# Patient Record
Sex: Female | Born: 1940
Health system: Southern US, Community
[De-identification: ages and names within clinical notes are randomized; demographics above are authoritative.]

## PROBLEM LIST (undated history)

## (undated) DIAGNOSIS — H3530 Unspecified macular degeneration: Secondary | ICD-10-CM

## (undated) DIAGNOSIS — I639 Cerebral infarction, unspecified: Secondary | ICD-10-CM

## (undated) DIAGNOSIS — F419 Anxiety disorder, unspecified: Secondary | ICD-10-CM

## (undated) DIAGNOSIS — N189 Chronic kidney disease, unspecified: Secondary | ICD-10-CM

## (undated) DIAGNOSIS — M1990 Unspecified osteoarthritis, unspecified site: Secondary | ICD-10-CM

## (undated) DIAGNOSIS — F0390 Unspecified dementia without behavioral disturbance: Secondary | ICD-10-CM

## (undated) DIAGNOSIS — D649 Anemia, unspecified: Secondary | ICD-10-CM

## (undated) DIAGNOSIS — J449 Chronic obstructive pulmonary disease, unspecified: Secondary | ICD-10-CM

## (undated) DIAGNOSIS — K219 Gastro-esophageal reflux disease without esophagitis: Secondary | ICD-10-CM

## (undated) DIAGNOSIS — F17200 Nicotine dependence, unspecified, uncomplicated: Secondary | ICD-10-CM

## (undated) DIAGNOSIS — Z87442 Personal history of urinary calculi: Secondary | ICD-10-CM

## (undated) HISTORY — PX: EYE SURGERY: SHX253

## (undated) HISTORY — PX: DILATION AND CURETTAGE OF UTERUS: SHX78

---

## 2000-06-06 ENCOUNTER — Other Ambulatory Visit: Admission: RE | Admit: 2000-06-06 | Discharge: 2000-06-06 | Payer: Self-pay | Admitting: *Deleted

## 2003-08-05 ENCOUNTER — Other Ambulatory Visit: Admission: RE | Admit: 2003-08-05 | Discharge: 2003-08-05 | Payer: Self-pay | Admitting: Family Medicine

## 2004-07-22 HISTORY — PX: COLECTOMY: SHX59

## 2004-07-24 ENCOUNTER — Ambulatory Visit (HOSPITAL_COMMUNITY): Admission: RE | Admit: 2004-07-24 | Discharge: 2004-07-24 | Payer: Self-pay | Admitting: Gastroenterology

## 2004-07-24 ENCOUNTER — Encounter (INDEPENDENT_AMBULATORY_CARE_PROVIDER_SITE_OTHER): Payer: Self-pay | Admitting: Specialist

## 2004-08-21 ENCOUNTER — Encounter (INDEPENDENT_AMBULATORY_CARE_PROVIDER_SITE_OTHER): Payer: Self-pay | Admitting: Specialist

## 2004-08-21 ENCOUNTER — Inpatient Hospital Stay (HOSPITAL_COMMUNITY): Admission: RE | Admit: 2004-08-21 | Discharge: 2004-08-25 | Payer: Self-pay | Admitting: General Surgery

## 2004-10-08 ENCOUNTER — Other Ambulatory Visit: Admission: RE | Admit: 2004-10-08 | Discharge: 2004-10-08 | Payer: Self-pay | Admitting: Family Medicine

## 2004-11-27 ENCOUNTER — Encounter: Admission: RE | Admit: 2004-11-27 | Discharge: 2004-11-27 | Payer: Self-pay | Admitting: General Surgery

## 2005-12-10 ENCOUNTER — Other Ambulatory Visit: Admission: RE | Admit: 2005-12-10 | Discharge: 2005-12-10 | Payer: Self-pay | Admitting: Family Medicine

## 2005-12-17 ENCOUNTER — Encounter: Admission: RE | Admit: 2005-12-17 | Discharge: 2005-12-17 | Payer: Self-pay | Admitting: Family Medicine

## 2006-08-20 ENCOUNTER — Encounter: Admission: RE | Admit: 2006-08-20 | Discharge: 2006-08-20 | Payer: Self-pay | Admitting: Family Medicine

## 2006-12-19 ENCOUNTER — Other Ambulatory Visit: Admission: RE | Admit: 2006-12-19 | Discharge: 2006-12-19 | Payer: Self-pay | Admitting: Family Medicine

## 2007-12-21 ENCOUNTER — Other Ambulatory Visit: Admission: RE | Admit: 2007-12-21 | Discharge: 2007-12-21 | Payer: Self-pay | Admitting: Family Medicine

## 2007-12-31 ENCOUNTER — Encounter: Admission: RE | Admit: 2007-12-31 | Discharge: 2007-12-31 | Payer: Self-pay | Admitting: Family Medicine

## 2008-08-08 ENCOUNTER — Encounter: Admission: RE | Admit: 2008-08-08 | Discharge: 2008-08-08 | Payer: Self-pay | Admitting: Family Medicine

## 2010-10-21 ENCOUNTER — Emergency Department (HOSPITAL_COMMUNITY): Payer: Medicare Other

## 2010-10-21 ENCOUNTER — Encounter (HOSPITAL_COMMUNITY): Payer: Self-pay

## 2010-10-21 ENCOUNTER — Inpatient Hospital Stay (HOSPITAL_COMMUNITY)
Admission: EM | Admit: 2010-10-21 | Discharge: 2010-10-25 | DRG: 418 | Disposition: A | Discharge status: Home / Self Care | Payer: Medicare Other | Attending: Family Medicine | Admitting: Family Medicine

## 2010-10-21 DIAGNOSIS — F17200 Nicotine dependence, unspecified, uncomplicated: Secondary | ICD-10-CM | POA: Diagnosis present

## 2010-10-21 DIAGNOSIS — F341 Dysthymic disorder: Secondary | ICD-10-CM | POA: Diagnosis present

## 2010-10-21 DIAGNOSIS — E876 Hypokalemia: Secondary | ICD-10-CM | POA: Diagnosis not present

## 2010-10-21 DIAGNOSIS — K8042 Calculus of bile duct with acute cholecystitis without obstruction: Secondary | ICD-10-CM | POA: Diagnosis present

## 2010-10-21 DIAGNOSIS — K5289 Other specified noninfective gastroenteritis and colitis: Secondary | ICD-10-CM | POA: Diagnosis present

## 2010-10-21 DIAGNOSIS — K219 Gastro-esophageal reflux disease without esophagitis: Secondary | ICD-10-CM | POA: Diagnosis present

## 2010-10-21 DIAGNOSIS — K859 Acute pancreatitis without necrosis or infection, unspecified: Principal | ICD-10-CM | POA: Diagnosis present

## 2010-10-21 LAB — LIPASE, BLOOD: Lipase: 625 U/L — ABNORMAL HIGH (ref 11–59)

## 2010-10-21 LAB — DIFFERENTIAL
Basophils Absolute: 0 10*3/uL (ref 0.0–0.1)
Basophils Relative: 0 % (ref 0–1)
Eosinophils Absolute: 0 10*3/uL (ref 0.0–0.7)
Eosinophils Relative: 0 % (ref 0–5)
Monocytes Absolute: 0.5 10*3/uL (ref 0.1–1.0)

## 2010-10-21 LAB — URINE MICROSCOPIC-ADD ON

## 2010-10-21 LAB — COMPREHENSIVE METABOLIC PANEL
ALT: 110 U/L — ABNORMAL HIGH (ref 0–35)
AST: 181 U/L — ABNORMAL HIGH (ref 0–37)
Calcium: 8.9 mg/dL (ref 8.4–10.5)
Creatinine, Ser: 0.72 mg/dL (ref 0.4–1.2)
GFR calc Af Amer: >60 mL/min (ref >60)
Sodium: 137 mEq/L (ref 135–145)
Total Protein: 6.4 g/dL (ref 6.0–8.3)

## 2010-10-21 LAB — CBC
MCHC: 34.5 g/dL (ref 30.0–36.0)
RDW: 12.9 % (ref 11.5–15.5)

## 2010-10-21 LAB — URINALYSIS, ROUTINE W REFLEX MICROSCOPIC
Bilirubin Urine: NEGATIVE
Hgb urine dipstick: NEGATIVE
Nitrite: NEGATIVE
Specific Gravity, Urine: 1.036 — ABNORMAL HIGH (ref 1.005–1.030)
pH: 8.5 — ABNORMAL HIGH (ref 5.0–8.0)

## 2010-10-21 MED ORDER — IOHEXOL 300 MG/ML  SOLN
100.0000 mL | Freq: Once | INTRAMUSCULAR | Status: AC | PRN
  Administered 2010-10-21: 100 mL via INTRAVENOUS

## 2010-10-22 ENCOUNTER — Other Ambulatory Visit (HOSPITAL_COMMUNITY): Payer: Medicare Other

## 2010-10-22 ENCOUNTER — Inpatient Hospital Stay (HOSPITAL_COMMUNITY): Payer: Medicare Other

## 2010-10-22 LAB — BASIC METABOLIC PANEL
Calcium: 8.4 mg/dL (ref 8.4–10.5)
GFR calc Af Amer: >60 mL/min (ref >60)
GFR calc non Af Amer: >60 mL/min (ref >60)
Glucose, Bld: 75 mg/dL (ref 70–99)
Potassium: 3.9 mEq/L (ref 3.5–5.1)
Sodium: 134 mEq/L — ABNORMAL LOW (ref 135–145)

## 2010-10-22 LAB — DIFFERENTIAL
Basophils Absolute: 0 10*3/uL (ref 0.0–0.1)
Basophils Relative: 0 % (ref 0–1)
Eosinophils Relative: 1 % (ref 0–5)
Lymphocytes Relative: 20 % (ref 12–46)
Monocytes Absolute: 0.8 10*3/uL (ref 0.1–1.0)

## 2010-10-22 LAB — CBC
HCT: 31.3 % — ABNORMAL LOW (ref 36.0–46.0)
MCHC: 33.5 g/dL (ref 30.0–36.0)
Platelets: 215 10*3/uL (ref 150–400)
RDW: 13.3 % (ref 11.5–15.5)
WBC: 9.6 10*3/uL (ref 4.0–10.5)

## 2010-10-22 LAB — MAGNESIUM: Magnesium: 2 mg/dL (ref 1.5–2.5)

## 2010-10-23 LAB — COMPREHENSIVE METABOLIC PANEL
ALT: 61 U/L — ABNORMAL HIGH (ref 0–35)
AST: 41 U/L — ABNORMAL HIGH (ref 0–37)
Albumin: 2.9 g/dL — ABNORMAL LOW (ref 3.5–5.2)
CO2: 26 mEq/L (ref 19–32)
Chloride: 108 mEq/L (ref 96–112)
Creatinine, Ser: 0.75 mg/dL (ref 0.4–1.2)
GFR calc Af Amer: >60 mL/min (ref >60)
GFR calc non Af Amer: >60 mL/min (ref >60)
Sodium: 138 mEq/L (ref 135–145)
Total Bilirubin: 0.4 mg/dL (ref 0.3–1.2)

## 2010-10-23 LAB — CBC
Hemoglobin: 10.2 g/dL — ABNORMAL LOW (ref 12.0–15.0)
MCH: 32.1 pg (ref 26.0–34.0)
Platelets: 194 10*3/uL (ref 150–400)
RBC: 3.18 MIL/uL — ABNORMAL LOW (ref 3.87–5.11)
WBC: 7.5 10*3/uL (ref 4.0–10.5)

## 2010-10-23 LAB — HEMOCCULT GUIAC POC 1CARD (OFFICE): Fecal Occult Bld: NEGATIVE

## 2010-10-24 ENCOUNTER — Other Ambulatory Visit: Payer: Self-pay | Admitting: Surgery

## 2010-10-24 ENCOUNTER — Inpatient Hospital Stay (HOSPITAL_COMMUNITY): Payer: Medicare Other

## 2010-10-24 HISTORY — PX: CHOLECYSTECTOMY: SHX55

## 2010-10-24 LAB — BASIC METABOLIC PANEL
BUN: 4 mg/dL — ABNORMAL LOW (ref 6–23)
CO2: 26 mEq/L (ref 19–32)
Calcium: 7.9 mg/dL — ABNORMAL LOW (ref 8.4–10.5)
Glucose, Bld: 91 mg/dL (ref 70–99)
Sodium: 139 mEq/L (ref 135–145)

## 2010-10-24 LAB — CBC
HCT: 29 % — ABNORMAL LOW (ref 36.0–46.0)
Hemoglobin: 9.9 g/dL — ABNORMAL LOW (ref 12.0–15.0)
MCH: 31.9 pg (ref 26.0–34.0)
MCHC: 34.1 g/dL (ref 30.0–36.0)
MCV: 93.5 fL (ref 78.0–100.0)
RDW: 13.2 % (ref 11.5–15.5)

## 2010-10-24 LAB — HEMOCCULT GUIAC POC 1CARD (OFFICE): Fecal Occult Bld: NEGATIVE

## 2010-10-25 LAB — COMPREHENSIVE METABOLIC PANEL
BUN: 6 mg/dL (ref 6–23)
CO2: 24 mEq/L (ref 19–32)
Calcium: 7.6 mg/dL — ABNORMAL LOW (ref 8.4–10.5)
Chloride: 109 mEq/L (ref 96–112)
Creatinine, Ser: 0.69 mg/dL (ref 0.4–1.2)
GFR calc non Af Amer: >60 mL/min (ref >60)
Total Bilirubin: 0.5 mg/dL (ref 0.3–1.2)

## 2010-10-25 LAB — CBC
Hemoglobin: 9.6 g/dL — ABNORMAL LOW (ref 12.0–15.0)
MCH: 32.1 pg (ref 26.0–34.0)
MCHC: 33.9 g/dL (ref 30.0–36.0)
MCV: 94.6 fL (ref 78.0–100.0)
Platelets: 186 10*3/uL (ref 150–400)
RBC: 2.99 MIL/uL — ABNORMAL LOW (ref 3.87–5.11)

## 2010-10-25 NOTE — Consult Note (Signed)
[name], [name]               ACCOUNT NO.:  [id number]  MEDICAL RECORD NO.:  [id number]           PATIENT TYPE:  I  LOCATION:  5522                         FACILITY:  [facility]  PHYSICIAN:  Mary Sella. Andrey Campanile, MD     DATE OF BIRTH:  Sep 28, 1940  DATE OF CONSULTATION:  10/22/2010 DATE OF DISCHARGE:                                CONSULTATION   PHYSICIAN REQUESTING CONSULTATION:  Jeoffrey Massed, MD.  PRIMARY CARE PHYSICIAN:  [name]. Valentina Lucks, MD  CHIEF COMPLAINT:  Abdominal pain.  REASON FOR CONSULTATION:  Cholecystitis.  HISTORY OF PRESENT ILLNESS:  [name] is a very pleasant 70 year old female who is in her usual state of health until she developed upper abdominal pain after eating breakfast this past Sunday.  The pain was constant.  At times, it was sharp and most of the time it was dull and crampy.  The pain lasted pretty much all day long prompting her to go to an urgent care medical center for evaluation.  There, she was found to have an elevated white blood cell count, thus was told to go to the emergency room.  While she was at home prior to going to the Urgent Care, she had one episode of nausea and vomiting.  She said she might have symptoms similar to this, maybe 1-2 months ago.  It was simply milder.  She described it as upper abdominal pain, mainly at night and went up into her chest and it felt like a pressure.  She initially thought she was having a heart attack.  She took Tums and Xanax which relieved the pain.  She went her PCP the next day and the EKG was normal.  She denies any jaundice or weight loss.  She denies any diarrhea or constipation.  Her last bowel movement was earlier today and it was normal.  She denies any alcohol or new medications.  PAST MEDICAL HISTORY: 1. Gastroesophageal reflux disease, 2. History of colitis. 3. Depression. 4. Glaucoma. 5. History of tubulovillous adenoma.  PAST SURGICAL HISTORY:  Right hemicolectomy.  MEDICATIONS  AT HOME: 1. Calcium carbonate. 2. Multivitamin. 3. Vitamin B12. 4. Vitamin B6. 5. Vitamin D. 6. Vitamin E. 7. Omeprazole. 8. Lexapro. 9. Evista. 10.Bupropion. 11.Xanax. 12.Cosopt.  HOSPITAL MEDICATIONS:  Lovenox, Protonix, Levaquin, and Flagyl.  ALLERGIES:  No known drug allergies.  REVIEW OF SYSTEMS:  She denies chest pain, shortness of breath, dyspnea on exertion, and paroxysmal nocturnal dyspnea.  She does have chronic neck pain.  She wears glasses.  Otherwise, a comprehensive 12-point review of systems is negative except as mentioned in the HPI.  FAMILY HISTORY:  Significant for 2 sisters having breast cancer.  She also has 1 brother and her father with some unknown cancer.  SOCIAL HISTORY:  She is married.  She smokes less than a pack a day and has for about 50 years.  She denies any drugs or alcohol.  PHYSICAL EXAMINATION:  GENERAL:  A well-developed, well-nourished Caucasian female in no apparent distress. VITAL SIGNS:  Temperature 98.4, heart rate 83, blood pressure 108/69, respirations 18, and saturating 96% on room air. HEENT:  Atraumatic  and normocephalic.  Pupils are equal.  No scleral icterus.  Positive glasses.  No external ear lesions.  Hearing grossly normal. NECK:  Supple.  Trachea is midline. PULMONARY:  Lungs are clear.  Symmetric chest rise.  No accessory respiratory muscles. CARDIOVASCULAR:  Regular rate and rhythm.  2+ radial pulse. ABDOMEN:  Soft and nondistended.  Positive bowel sounds.  Well-healed transverse right abdominal incision and no signs of incisional hernia. She is tender to palpation in the right upper quadrant.  No rebound.  No guarding. MUSCULOSKELETAL:  Free range of motion.  Moves all extremities well. Strength is symmetric. NEUROLOGIC:  Nonfocal.  Sensation grossly intact. SKIN:  No jaundice.  No rash.  No edema. PSYCHIATRIC:  Alert and oriented.  Judgment seems to be appropriate.  LABORATORY DATA:  BMET from today shows  sodium 134, potassium 3.9, chloride 100, bicarb 26, BUN 10, creatinine 0.7, blood sugar 75, calcium 8.4, mag 2.  White blood cell count 9.6, down from 14.4 on admission; hemoglobin 10.5, down from 12.7; hematocrit 31.3, down from 36.8. Urinalysis negative.  Significant labs from admission on October 21, 2010, showed a total bilirubin of 0.9, AST of 181, ALT 110, alkaline phosphatase 41, lipase elevated at 625.  RADIOGRAPHY: 1. Acute bowel series, nothing acute. 2. CT of abdomen and pelvis showed gallbladder wall edema, stable     right inferior hepatic lobe cyst, nonobstructing stone in the upper     pole of the right kidney, and questionable mild edema in the     sigmoid colon and rectum. 3. Ultrasound abdomen showed gallbladder wall thickening with     pericholecystic fluid and some sludge.  The common bile duct was of     normal caliber and size.  There is a 1.2-cm cyst in the right lobe.  IMPRESSION:  This is a 70 year old female with: 1. Anemia. 2. Gastroesophageal reflux disease. 3. Glaucoma. 4. Elevated LFTs. 5. Acute cholecystitis.  PLAN:  I think the LFT elevation and lipase elevation are secondary to her passing a stone or having thick sludge in her duct.  There were really no signs of pancreatitis on her CT and her pancreas looked pretty normal.  I agree with clears as tolerated.  I would continue IV antibiotics for acute cholecystitis.  I would continue chemical DVT prophylaxis for now.  I would definitely repeat a CMET and lipase in the morning.  If it is trending down, I would just follow it.  However, if it is trending up, she would more than likely need a gastroenterology consult.  We are tentatively going to plan the laparoscopic cholecystectomy on Wednesday.  We discussed the risks and benefits of surgery.  Dr. Dwain Sarna and physician extenders will see her tomorrow and review the labs and come up with a definitive plan about timing for surgery.     Mary Sella.  Andrey Campanile, MD     EMW/MEDQ  D:  10/22/2010  T:  10/23/2010  Job:  [id number]  cc:   [name]. Valentina Lucks, M.D.  Electronically Signed by Gaynelle Adu M.D. on 10/25/2010 07:57:24 AM

## 2010-10-26 LAB — CROSSMATCH
ABO/RH(D): A POS
Antibody Screen: NEGATIVE
Unit division: 0

## 2010-10-26 NOTE — Discharge Summary (Signed)
NAMEKRISANDRA, [name]               ACCOUNT NO.:  [id number]  MEDICAL RECORD NO.:  [id number]           PATIENT TYPE:  I  LOCATION:  5522                         FACILITY:  [facility]  PHYSICIAN:  Pleas Koch, MD        DATE OF BIRTH:  [birth year]  DATE OF ADMISSION:  10/21/2010 DATE OF DISCHARGE:  10/25/2010                              DISCHARGE SUMMARY   PERTINENT CONSULTATIONS:  Mary Sella. Andrey Campanile, MD  PERTINENT PROCEDURES DONE:  Laparoscopic cholecystectomy with intraoperative cholangiogram done on October 24, 2010.  DISCHARGE DIAGNOSES: 1. Gallstone pancreatitis status post cholecystectomy. 2. Colitis query cause. 3. Reflux. 4. Depression, anxiety. 5. History of right colon tubulovillous adenoma status post an     ileotransverse colostomy in January 2001.  DISCHARGE MEDICATIONS:  Are as follows, 1. Tylenol Extra Strength 500 mg 2 tablets t.i.d. p.r.n. 2. Bupropion XL 150 mg 1 tablet daily. 3. Lexapro 10 mg 1.5 tablets daily.4. Xanax 1 tablet 0.25 mg daily p.r.n. for anxiety. 5. Cosopt ophthalmic both eyes 1 drop b.i.d. 6. Evista 60 mg 1 tablet daily. 7. Multivitamins over-the-counter 1 tablet daily. 8. Latanoprost 0.005% both eyes ophthalmic 1 drop at bedtime. 9. Omeprazole 1 capsule daily. 10.Calcium carbonate over-the-counter 3 tabs daily. 11.Vitamin B12 one tab daily. 12.B6 one tablet daily. 13.Vitamin D over-the-counter 1 tablet daily. 14.Vitamin E orally over-the-counter daily. 15.KDUR 20 mg, 5 day supply 16.Vicodin 5/500 per General Surgeon's instructions (script written)  RECOMMENDATIONS ON Follow up with pcp:  Likely would be reasonable to discontinue vitamin E and discontinue her p.r.n. Tylenol.  I have prescribed her with limited course of hydrocodone/APAP 5/325 mg 32 tablets to take q.6 h. p.r.n. for pain, not relieved with ibuprofen over-the-counter.  Please see full dictation number [id number].  Briefly, this is a 70 year old female with history of epigastric  generalized abdominal pain starting at home and she started constipating, one episode was noted but no diarrhea, fever, chills, shortness breath, cramping was dull in character persisted over 12 hours.  She had weakness, fatigue, abdominal pain, epigastric with generalized vomiting and nausea, no diarrhea.  Anxiety.  PHYSICAL EXAMINATION:  VITAL SIGNS:  On admission blood pressure 100/50, temperature 99.1, respirations 18, O2 sats 96%.  Pertinent positives on exam. ABDOMEN:  Slightly tender epigastric right lower quadrant, but not severely so positive bowel sounds.  No hepatosplenomegaly.  No hernia. CT abdomen equals mild.  LABORATORY/IMAGES DATA:  CT of abdomen equals mild edema, thickening of sigmoid colon, rectum, gallbladder, and edema as well.  Lipase 625, AST 181, ALT 101.  WBC 14.4.  T-bili 0.9, BUN 14, and creatinine 0.72.  HOSPITAL COURSE:  According to plan, 1. Gallstone pancreatitis.  The patient had an ultrasound done on     October 22, 2010, which showed biliary sludge, gallbladder thickening,     and pericholecystic fluid suggesting cholecystitis.  It was noted     that her LFTs did trend down on their own.  However, we consulted     Dr. Andrey Campanile for General Surgery and it was recommended that since     LFTs were still trending slightly up, the plan was  for surgery.     The patient had surgery for March 2012, and tolerated well under     Dr. Dwain Sarna.  The patient did very well status postop up and was     cleared by surgeon for discharge.  Outpatient will followup in 2     weeks with Dr. Dwain Sarna in his office. 2. Colitis.  This has been a chronic problem in the past and she     follows with Dr. Evette Cristal for this. 1 out of 3 Hemoccult cards was     positive for blood.  Her hemoglobin was 12.7 on admission and     dropped to about 9.6 on discharge.  This also may be likely     secondary to surgery.  As such as she has just had surgery,      I am electing to discharge her  home with the     caveat that she will follow up with Dr. Evette Cristal in the outpatient     setting in about 1 month's time to determine further followup, as     she has had a colonoscopy in 2006 showing tubulovillous adenoma.     The patient is agreeable to the same. 3. Reflux.  The patient will be continued on her omeprazole. 4. Pain.  The patient was given limited prescription of     hydrocodone/APAP for recent surgery by Surgeon-30 tablets 5. Depression.  The patient will continue on bupropion, Lexapro, and     Xanax as needed.  The patient will need followup with primary care     physician for this. 6. Hypokalemia.  The patient was slightly hypokalemic on day of     discharge and I have given her K-Dur 20 mEq to take for 5 days.     The patient will benefit from a CMP in the near future at primary     care physician's office.  PHYSICAL EXAMINATION:  VITAL SIGNS:  Stable on discharge, her temperature was 98.3, blood pressure 103-109 over 50-65, pulse 61, and respirations 22.          ______________________________ Pleas Koch, MD     JS/MEDQ  D:  10/25/2010  T:  10/25/2010  Job:  [id number]  cc:   Mary Sella. Andrey Campanile, MD Juanetta Gosling, MD [name] Valentina Lucks, M.D. [name], M.D.  Electronically Signed by Pleas Koch MD on 10/26/2010 05:03:33 AM

## 2010-10-28 LAB — CULTURE, BLOOD (ROUTINE X 2)
Culture  Setup Time: 201204020841
Culture: NO GROWTH

## 2010-10-30 NOTE — Op Note (Signed)
[name]               ACCOUNT NO.:  [id number]  MEDICAL RECORD NO.:  [id number]           PATIENT TYPE:  I  LOCATION:  5522                         FACILITY:  [facility]  PHYSICIAN:  Abigail Miyamoto, M.D. DATE OF BIRTH:  [birth year]  DATE OF PROCEDURE:  10/24/2010 DATE OF DISCHARGE:                              OPERATIVE REPORT   PREOPERATIVE DIAGNOSIS:  Gallstone pancreatitis with acute cholecystitis.  POSTOPERATIVE DIAGNOSIS:  Gallstone pancreatitis with acute cholecystitis.  PROCEDURE:  Laparoscopic cholecystectomy with intraoperative cholangiogram.  SURGEON:  Abigail Miyamoto, MD  ANESTHESIA:  General endotracheal anesthesia.  ESTIMATED BLOOD LOSS:  Minimal.  FINDINGS:  The patient was found to have a normal cholangiogram. Gallbladder showed findings consistent with acute cholecystitis.  PROCEDURE IN DETAIL:  The patient was brought to the operating room and identified as [name].  She was placed supine on the operating table and general anesthesia was induced.  Her abdomen was then prepped and draped in the usual sterile fashion.  Using a #15 blade, a small vertical incision was made above the umbilicus.  This was carried down to the fascia which was then opened with a scalpel.  A hemostat was then used to pass into the peritoneal cavity under direct vision.  Next, a 0 Vicryl pursestring suture was placed around the fascial opening.  The Hasson port was placed through the opening and insufflation of the abdomen was begun.  A 5-mm port was placed in the patient's epigastrium and two more in the right upper quadrant under direct vision.  I visualized the abdomen and saw no adhesions from her previous right partial colectomy.  The gallbladder was found to be acutely inflamed.  I was able to grasp and elevate it above the liver bed.  I then took down some adhesions from the gallbladder bluntly.  I was then able to identify the cystic duct and cystic artery  and achieve a critical window around both.  I clipped the cystic duct once distally, I then clipped the artery twice proximally, once distally, I then made a small opening to the cystic duct with laparoscopic scissors.  I placed a cholangiocatheter in the right upper quadrant under direct vision through a small incision.  I then placed this into the opening of the cystic duct.  A cholangiogram was then performed with contrast.  This demonstrated __________ duodenum without evidence of obstruction.  At this point, the cholangiocatheter was removed.  I clipped the cystic duct three times proximally and transected as well as the cystic artery. I then identified a posterior branch of cystic artery which I clipped as well.  The gallbladder was slowly dissected free from the liver bed with the electrocautery.  Once it was freed from liver bed, hemostasis was achieved in liver bed with cautery.  I then removed the gallbladder through the incision at the umbilicus.  The 0 Vicryl at the umbilicus was tied in place closing the fascial defect.  I again examined the liver bed and hemostasis was felt to be achieved.  The abdomen was then irrigated with saline.  All ports were then removed under direct vision and  the abdomen was deflated.  All incisions were anesthetized with Marcaine and closed with 4- 0 Monocryl subcuticular sutures.  Steri-Strips and Band-Aids were then applied.  The patient tolerated the procedure well.  All counts were correct at the end of the procedure.  The patient was then extubated in the operating room and taken in stable condition to the recovery room.     Abigail Miyamoto, M.D.     DB/MEDQ  D:  10/24/2010  T:  10/25/2010  Job:  [id number]  Electronically Signed by Abigail Miyamoto M.D. on 10/30/2010 03:53:53 PM

## 2010-11-19 ENCOUNTER — Encounter (INDEPENDENT_AMBULATORY_CARE_PROVIDER_SITE_OTHER): Payer: Self-pay | Admitting: General Surgery

## 2010-11-22 NOTE — H&P (Signed)
NAMENORVELLA, LOSCALZO               ACCOUNT NO.:  [id number]  MEDICAL RECORD NO.:  [id number]           PATIENT TYPE:  I  LOCATION:  5522                         FACILITY:  [facility]  PHYSICIAN:  Tripp Goins, DO         DATE OF BIRTH:  1941-07-12  DATE OF ADMISSION:  10/21/2010 DATE OF DISCHARGE:                             HISTORY & PHYSICAL   CHIEF COMPLAINT:  Abdominal pain and vomiting.  HISTORY OF PRESENT ILLNESS:  The patient is a 70 year old female who presents with history of epigastric/generalized abdominal pain since this morning, it started at home when she stood up from sitting at the computer.  The patient had one episode of emesis.  She denies diarrhea, fever, chills, shortness of breath.  The pain is cramping and dull in character, has persisted for over 12 hours, although the patient now says she is feeling better.  PAST MEDICAL HISTORY:  Significant for: 1. Tubulovillous adenoma. 2. Colitis. 3. GERD. 4. Depression. 5. Glaucoma. 6. Gout.  PAST SURGICAL HISTORY:  Significant for appendectomy, partial colectomy.  SOCIAL HISTORY:  No alcohol, no recreational drug use.  She does smoke about a pack per day giving her about a 50-pack-year history.  FAMILY HISTORY:  Strongly positive for cancer.  Two sisters had breast cancer.  One is still alive, 30+ years later.  The other died years later as other causes.  One other brother had cancer and COPD.  Mother died of old age.  Father also had some kind of cancer.  She does not remember what kind, but he also died of old age.  REVIEW OF SYSTEMS:  CONSTITUTIONAL:  Negative for fever.  Negative for chills.  Positive for weakness.  Positive for fatigue.  CNS:  No headaches, no seizures, no limb weakness.  ENT:  No nasal congestion, throat pain, or coryza.  CARDIOVASCULAR:  No chest pain.  No palpitations.  No orthopnea.  RESPIRATORY:  No cough, no shortness of breath, no wheezing.  GASTROINTESTINAL:  Positive for abdominal  pain, which is both epigastric and generalized.  Positive for vomiting. Positive for nausea for a short period of time.  Negative for diarrhea. Negative for constipation.  GENITOURINARY:  No dysuria.  No hematuria. No urinary frequency.  RENAL:  No flank pain.  No swelling.  No pruritus.  SKIN:  No rashes.  No sores.  No lesions.  HEMATOLOGICAL:  No easy bruising.  No purpura.  No clots.  LYMPHS:  No lymphadenopathy.  No painful nodes or no specific lymph swelling.  PSYCHIATRIC:  Positive for anxiety.  Positive for compression.  Negative for insomnia.  PHYSICAL EXAMINATION:  VITAL SIGNS:  Heart rate 86, temperature 99.1, blood pressure 100/50, respirations 18, O2 sat 96% on room air. GENERAL:  The patient is elderly, but awake, alert and oriented x3.  She is well developed, well nourished, and is able to give fair history. EYES:  Pupils are equal, round and reactive to light and accommodation. External ocular movements bilaterally intact.  Sclarea nonicteric, noninjected. MOUTH:  Oral mucosa is dry.  No lesions.  No sores. PHARYNX:  Clear.  No  erythema.  No exudate. NECK:  Negative for JVD.  Negative for thyromegaly.  Negative for lymphadenopathy. HEART:  Regular rate and rhythm at 80 beats per minute without murmurs, ectopy, or gallops.  No lateral PMI.  No thrills. LUNGS:  Clear to auscultation bilaterally without wheezes, rales or rhonchi.  No increased work of breathing.  No tactile fremitus. ABDOMEN:  Soft, slightly tender in epigastrium and right lower quadrant, but not severely so.  Positive bowel sounds.  No hepatosplenomegaly.  No hernias palpated. EXTREMITIES:  Negative for cyanosis, clubbing, or edema.  The patient has somewhat diminished dorsalis pedis and popliteal pulses bilaterally. No carotid bruits bilaterally. NEUROLOGIC:  Cranial nerves II through XII grossly intact.  Motor and sensory intact.  LABORATORY STUDIES:  CT of the abdomen shows mild edema, wall  thickening of the sigmoid colon and rectum.  Mild edema of gallbladder wall. Urinalysis is negative for UTI.  Lipase is 625.  Sodium 137, potassium 3.4, chloride 101, CO2 25, bun 14, creatinine 0.72.  T-bilis are 0.9, alk phos 41, AST 181, ALT 101, total protein 6.4, albumin 4.0, calcium 8.9.  WBC is 14.4, hemoglobin 12.7, hematocrit 36.8, platelets are 233.  ASSESSMENT: 1. Colitis.  The patient has elevated white count, abdominal pain and     right-sided colon and rectal edema.  Given her history of colitis,     she was reasonable. 2. Cholecystitis, this is unclear on the CT; however, the patient does     have gallbladder wall edema and elevated liver function test.  We     will check right upper quadrant ultrasound. 3. Nausea and vomiting. 4. Gastroesophageal reflux disease. 5. Tobacco abuse. 6. Hypokinesia. 7. Elevated LFTs.  PLAN: 1. Admit to regular medical bed. 2. IV fluids. 3. IV antibiotics. 4. Right upper quadrant ultrasound. 5. Hemoccult stools. 6. Supplement of potassium. 7. Proton pump inhibitor. 8. Tobacco cessation counseling, 7 minutes performed. 9. Clear liquid diet for now. 10.Follow LFTs and white blood cell count.  I have spent 42 minutes from this admission.          ______________________________ Fran Lowes, DO     AS/MEDQ  D:  10/21/2010  T:  10/22/2010  Job:  [id number]  cc:   Feliciana Rossetti, MD  Electronically Signed by Fran Lowes DO on 11/22/2010 06:07:12 PM

## 2010-12-07 NOTE — Op Note (Signed)
[name], [name]               ACCOUNT NO.:  [id number]   MEDICAL RECORD NO.:  [id number]          PATIENT TYPE:  AMB   LOCATION:  ENDO                         FACILITY:  [facility]   PHYSICIAN:  [name], M.D.   DATE OF BIRTH:  Jun 23, 1941   DATE OF PROCEDURE:  07/24/2004  DATE OF DISCHARGE:                                 OPERATIVE REPORT   PROCEDURE:  Esophagogastroduodenoscopy with biopsy for CLO test.   ENDOSCOPIST:  Dr. Herbert Moors   PREMEDICATIONS:  Premedication was fentanyl 40 mcg IV, Versed 5 mg IV.   INDICATIONS:  Heartburn.  Informed consent was obtained after explanation of  the risks of bleeding, infection and perforation.   PROCEDURE:  With the patient in the left lateral decubitus position, the  Olympus gastroscope was inserted into the oropharynx and passed into the  esophagus.  It was advanced down the esophagus and then into the stomach and  into the duodenum.  The second portion and bulb of the duodenum were normal.  The stomach showed a diffuse mild-to-moderate erythema to the mucosa  compatible with gastritis.  Biopsy for CLO test was obtained to look for any  evidence of Helicobacter pylori.  The fundus and cardia looked normal on  retroflexion.  The esophagus looked normal in its entirety.  The  esophagogastric junction was at 39 cm.  She tolerated the procedure well  without complications.   IMPRESSION:  Gastritis.   PLAN:  The CLO test will be checked.       SFG/MEDQ  D:  07/24/2004  T:  07/24/2004  Job:  [id number]   cc:   [name]. Valentina Lucks, M.D.  [address] [place]  [place] [address]  Fax: [phone]

## 2010-12-07 NOTE — Op Note (Signed)
NAMEPEARLENA, OW               ACCOUNT NO.:  [id number]   MEDICAL RECORD NO.:  [id number]          PATIENT TYPE:  AMB   LOCATION:  ENDO                         FACILITY:  [facility]   PHYSICIAN:  [name], M.D.   DATE OF BIRTH:  [birth year]   DATE OF PROCEDURE:  07/24/2004  DATE OF DISCHARGE:                                 OPERATIVE REPORT   PROCEDURE:  Colonoscopy with biopsy.   INDICATIONS:  Diarrhea.  Etiology unclear.   Informed consent was obtained after explantation of the risks of bleeding,  infection, and perforation.   PREMEDICATIONS:  The procedure was done immediately after an EGD, with  additional 20 mcg of fentanyl and 2 mg of Versed.   PROCEDURE:  With the patient in the left lateral decubitus position, a  rectal exam was performed.  No masses were felt. The Olympus colonoscope was  inserted into the rectum and advanced around the colon to the cecum.  Cecal  landmarks were identified.  The cecum looked normal.  In the proximal  ascending colon, there was a 4 cm sessile mass lesion which was taking up  approximately 1/2 to 3/4 of the lumen.  The lesion was biopsied.  The mucosa  peeled off as it was biopsied.  It is suspicious for malignancy versus large  villous adenoma.  The rest of the ascending colon looked normal.  The  transverse colon looked normal.  The descending colon and sigmoid looked  normal.  In the distal rectum, there was a 3 mm polyp biopsied with cold  forceps.  She tolerated the procedure well, without complications.   IMPRESSION:  1.  Large 4 cm mass in the proximal ascending colon biopsied to rule out      malignancy.  2.  Small 3 mm rectal polyp.   PLAN:  The patient will be referred for surgery for this large mass lesion  in the proximal ascending colon.       SFG/MEDQ  D:  07/24/2004  T:  07/24/2004  Job:  [id number]   cc:   [name]. Valentina Lucks, M.D.  [address] [place]  [place] [address]  Fax: [phone]

## 2010-12-07 NOTE — Op Note (Signed)
[name], [name]               ACCOUNT NO.:  [id number]   MEDICAL RECORD NO.:  [id number]          PATIENT TYPE:  INP   LOCATION:  0012                         FACILITY:  [facility]   PHYSICIAN:  Gita Kudo, M.D. DATE OF BIRTH:  March 16, 1941   DATE OF PROCEDURE:  08/21/2004  DATE OF DISCHARGE:                                 OPERATIVE REPORT   OPERATIVE PROCEDURE:  Right colectomy with primary anastomosis -  ileotransverse colostomy.   SURGEON:  Gita Kudo, M.D.   ASSISTANT:  Lorne Skeens. Hoxworth, M.D.   ANESTHESIA:  General endotracheal.   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   CLINICAL SUMMARY:  This 70 year old female underwent a colonoscopy that  showed a biopsy-proven TVA.  She is in good health and has had no other  significant medical problems except for some depression.   OPERATIVE FINDINGS:  The patient had a sessile tumor in the distal ascending  colon that felt about 4 cm in size.  The liver looked normal, without any  evidence of metastatic disease.  There was no fluid in the abdomen, no  enlarged lymph nodes.  The gallbladder felt and looked normal.  The  remainder of the large and small bowel felt and looked normal also.   OPERATIVE PROCEDURE:  Under satisfactory general endotracheal anesthesia,  the patient was positioned, prepped and draped in a standard fashion.  She  received heparin and Cefotan preop and had nasogastric and Foley catheters  placed.  She had a good bowel prep.  A transverse incision made from the  umbilicus laterally and carried into the peritoneal cavity.  Bleeders were  coagulated or tied with silk.  The laparotomy revealed the findings  mentioned above, and then a right colectomy performed.  Self-retaining  retractors gave excellent exposure, and operating with the cautery the  peritoneal reflections of  the right colon were taken down and the distal  ileum and proximal transverse colon mobilized.  Then, the distal ileum was  transected with a GIA stapler near the cecum, and the transverse colon  likewise transected.  The mesentery was divided between clamps and ties of  silk and the specimen removed.  The small bowel had a proximal occluding  clamp placed, and then a side-to-side/end-to-end GIA stapled anastomosis  performed.  Stab wound was made, stapler fired.  Anastomotic staple line  looked good.  Stab wound closed in two layers with interrupted and running  silk, and then the mesentery closed with interrupted silk sutures after the  gloves were changed.  The abdomen was then lavaged with saline.  The return  was noted to be clear.  Abdomen was closed in layers with running #1 PDS  suture, followed by staples for skin, and then a sterile absorbent dressing  applied.   Blood loss was negligible.  The patient tolerated the procedure well.  Total  operating time 1 hour 15 minutes.  No complications.  Patient to the  recovery room in good condition.  MRL/MEDQ  D:  08/21/2004  T:  08/21/2004  Job:  [id number]   cc:   [name]. Valentina Lucks, M.D.  [address] [place]  [place] [address]  Fax: [phone]   [name], M.D.  [address]  [address]  [place], [place] [address]  Fax: [phone]

## 2010-12-07 NOTE — Discharge Summary (Signed)
NAMEMACKINSEY, PELLAND               ACCOUNT NO.:  [id number]   MEDICAL RECORD NO.:  [id number]          PATIENT TYPE:  INP   LOCATION:  0476                         [facility]ACILITY[facility]   PHYSICIAN:  Gita Kudo, M.D. DATE OF BIRTH:  June 21, 1941   DATE OF ADMISSION:  08/21/2004  DATE OF DISCHARGE:  08/25/2004                                 DISCHARGE SUMMARY   CHIEF COMPLAINT:  Tumor of her colon.   HISTORY OF PRESENT ILLNESS:  This 70 year old female is admitted for  elective right colectomy.  She had colonoscopy showing a biopsy proven TVA.  Her general health is basically good, and she does have depression.   LABORATORY STUDIES:  Pathology:  Right colon and terminal ileum with a  tubulovillous adenoma with high-grade dysplasia, no invasive carcinoma,  benign appendix, 18 benign lymph nodes.  Other laboratory studies -  hemoglobin 13.0, hematocrit 38.0, white count 6100.  CMET within normal  limits except slightly low alk phos of 35.  Her CBA was normal at 2.8.  EKG  was normal.  The chest x-ray was negative.   HOSPITAL COURSE:  On the morning of admission, the patient underwent an  uneventful right colectomy.  Postoperatively, she did well.  She had her  tubes removed on schedule, and improved to the point where she was taking a  diet and passing gas and BM.  Accordingly, she was allowed home on her  fourth postop day.  She was tolerating a regular diet, limited activity.  Prescribed analgesics, and follow up in the office in 2-3 weeks.   DISCHARGE DIAGNOSES:  Right colon tubulovillous adenoma.   OPERATIONS:  On August 21, 2004, right colon resection.   COMPLICATIONS/INFECTIONS:  None.   CONDITION ON DISCHARGE:  Good.      MRL/MEDQ  D:  09/04/2004  T:  09/04/2004  Job:  [id number]   cc:   [name], M.D.  [address]  [address]  [place], [place] [address]  Fax: [phone]   [name]. Valentina Lucks, M.D.  [address] [place]  [place] [address]  Fax:  [phone]

## 2011-08-29 ENCOUNTER — Other Ambulatory Visit (HOSPITAL_COMMUNITY): Payer: Self-pay | Admitting: Gastroenterology

## 2011-09-04 ENCOUNTER — Ambulatory Visit (HOSPITAL_COMMUNITY)
Admission: RE | Admit: 2011-09-04 | Discharge: 2011-09-04 | Disposition: A | Discharge status: Home / Self Care | Payer: Medicare Other | Source: Ambulatory Visit | Attending: Gastroenterology | Admitting: Gastroenterology

## 2011-09-04 DIAGNOSIS — R1310 Dysphagia, unspecified: Secondary | ICD-10-CM | POA: Insufficient documentation

## 2014-05-12 ENCOUNTER — Other Ambulatory Visit: Payer: Self-pay

## 2014-06-09 ENCOUNTER — Other Ambulatory Visit: Payer: Self-pay | Admitting: Dermatology

## 2015-09-22 ENCOUNTER — Emergency Department (HOSPITAL_COMMUNITY)
Admission: EM | Admit: 2015-09-22 | Discharge: 2015-09-23 | Disposition: A | Discharge status: Home / Self Care | Payer: Medicare Other | Attending: Emergency Medicine | Admitting: Emergency Medicine

## 2015-09-22 ENCOUNTER — Encounter (HOSPITAL_COMMUNITY): Payer: Self-pay | Admitting: *Deleted

## 2015-09-22 DIAGNOSIS — T368X5A Adverse effect of other systemic antibiotics, initial encounter: Secondary | ICD-10-CM | POA: Insufficient documentation

## 2015-09-22 DIAGNOSIS — Z87440 Personal history of urinary (tract) infections: Secondary | ICD-10-CM | POA: Insufficient documentation

## 2015-09-22 DIAGNOSIS — Z792 Long term (current) use of antibiotics: Secondary | ICD-10-CM | POA: Diagnosis not present

## 2015-09-22 DIAGNOSIS — L299 Pruritus, unspecified: Secondary | ICD-10-CM | POA: Diagnosis not present

## 2015-09-22 DIAGNOSIS — T7840XA Allergy, unspecified, initial encounter: Secondary | ICD-10-CM | POA: Insufficient documentation

## 2015-09-22 DIAGNOSIS — Z79899 Other long term (current) drug therapy: Secondary | ICD-10-CM | POA: Diagnosis not present

## 2015-09-22 DIAGNOSIS — F17200 Nicotine dependence, unspecified, uncomplicated: Secondary | ICD-10-CM | POA: Insufficient documentation

## 2015-09-22 MED ORDER — DIPHENHYDRAMINE HCL 25 MG PO CAPS
25.0000 mg | ORAL_CAPSULE | Freq: Once | ORAL | Status: AC
  Administered 2015-09-22: 25 mg via ORAL
  Filled 2015-09-22: qty 1

## 2015-09-22 NOTE — ED Notes (Signed)
The pt has a uti  She took one pill of bactrim  That was prescribed  Bactrim  She took the pill approx 1900  She has been itching since then  No resp distress  No hives

## 2015-09-23 MED ORDER — CEPHALEXIN 500 MG PO CAPS: 500.0000 mg | ORAL_CAPSULE | Freq: Four times a day (QID) | ORAL | Status: DC

## 2015-09-23 NOTE — Discharge Instructions (Signed)
PLEASE STOP YOUR BACTRIM AND PLEASE START KEFLEX YOU CAN TAKE BENADRYL OVER THE COUNTER FOR YOUR ITCHING

## 2015-09-23 NOTE — ED Provider Notes (Signed)
CSN: [id number]     Arrival date & time 09/22/15  2033 History  By signing my name below, I, [name], attest that this documentation has been prepared under the direction and in the presence of Meghan Rhineonald Adlene Adduci, MD. Electronically Signed: [name], ED Scribe. 09/23/2015. 12:23 AM.    Chief Complaint  Patient presents with  . Allergic Reaction      Patient is a 75 y.o. female presenting with allergic reaction. The history is provided by the patient. No language interpreter was used.  Allergic Reaction Presenting symptoms: itching   Presenting symptoms: no difficulty breathing, no difficulty swallowing and no rash   Severity:  Mild Prior allergic episodes:  No prior episodes Context: medications   Relieved by:  Antihistamines Worsened by:  Nothing tried  HPI Comments: [name] is a 75 y.o. female who presents to the Emergency Department complaining of constant, gradual improving allergic reaction onset this evening. She reports being seen by her doctor for a UTI and was treated with Bactrim. Pt states she took one dose after dinner tonight and began to start itching immediately all over her body. Pt received some Benadryl in triage to alleviate her symptoms with relief. She denies tongue swelling, throat swelling, nausea, vomiting, diarrhea, and new sores in mouth.  History reviewed. No pertinent past medical history. Past Surgical History  Procedure Laterality Date  . Colectomy  2006    BENIGN TUMOR  . Cholecystectomy  10/24/2010   Family History  Problem Relation Age of Onset  . Cancer Brother   . Diabetes Brother   . Heart disease Brother   . Cancer Sister   . Diabetes Sister    Social History  Substance Use Topics  . Smoking status: Current Every Day Smoker -- 1.00 packs/day  . Smokeless tobacco: None  . Alcohol Use: No   OB History    No data available     Review of Systems  HENT: Negative for trouble swallowing.   Respiratory: Negative for  shortness of breath.   Gastrointestinal: Negative for nausea, vomiting and diarrhea.  Skin: Positive for itching. Negative for rash.  All other systems reviewed and are negative.     Allergies  Review of patient's allergies indicates no known allergies.  Home Medications   Prior to Admission medications   Medication Sig Start Date End Date Taking? Authorizing Provider  acetaminophen (TYLENOL) 500 MG tablet Take 500 mg by mouth as needed.      Historical Provider, MD  ALPRAZolam Prudy Feeler(XANAX) 0.25 MG tablet Take 0.25 mg by mouth as needed. SELDOM TAKE     Historical Provider, MD  bismuth subsalicylate (PEPTO BISMOL) 262 MG chewable tablet Chew 524 mg by mouth as needed.      Historical Provider, MD  buPROPion (WELLBUTRIN XL) 150 MG 24 hr tablet Take 150 mg by mouth daily.      Historical Provider, MD  Dorzolamide HCl-Timolol Mal (COSOPT OP) Apply 10 mLs to eye 2 (two) times daily.      Historical Provider, MD  escitalopram (LEXAPRO) 20 MG tablet Take 20 mg by mouth daily.      Historical Provider, MD  Latanoprost (XALATAN OP) Apply 2.5 mLs to eye daily.      Historical Provider, MD  loperamide (IMODIUM A-D) 2 MG tablet Take 2 mg by mouth as needed.      Historical Provider, MD  Loratadine (CLARITIN PO) Take by mouth as needed.      Historical Provider, MD  Multiple Vitamin (  MULTIVITAMIN) capsule Take 1 capsule by mouth daily. VIT. B,D,E     Historical Provider, MD  omeprazole (PRILOSEC) 20 MG capsule Take 20 mg by mouth as needed.      Historical Provider, MD  Pseudoephedrine HCl (SUDAFED PO) Take 10 mg by mouth as needed.      Historical Provider, MD  raloxifene (EVISTA) 60 MG tablet Take 60 mg by mouth daily.      Historical Provider, MD   Triage vitals: BP 100/58 mmHg  Pulse 92  Temp(Src) 98.2 F (36.8 C) (Oral)  Resp 16  SpO2 96% Physical Exam CONSTITUTIONAL: Well developed/well nourished HEAD: Normocephalic/atraumatic EYES: EOMI/PERRL ENMT: Mucous membranes moist, no  angioedema NECK: supple no meningeal signs SPINE/BACK:entire spine nontender CV: S1/S2 noted, no murmurs/rubs/gallops noted LUNGS: Lungs are clear to auscultation bilaterally, no apparent distress ABDOMEN: soft, nontender, no rebound or guarding, bowel sounds noted throughout abdomen NEURO: Pt is awake/alert/appropriate, moves all extremitiesx4.  No facial droop.   SKIN: warm, color normal, no rash PSYCH: no abnormalities of mood noted, alert and oriented to situation  ED Course  Procedures  DIAGNOSTIC STUDIES: Oxygen Saturation is 96% on RA, adequate by my interpretation.  COORDINATION OF CARE:  12:19 AM Discussed treatment plan which includes Keflex with pt at bedside and pt agreed to plan.  Pt with very mild allergic rxn She is well appearing Stop bactrim Start keflex Advised use of benadryl Discussed return precautions  MDM   Final diagnoses:  Allergic reaction, initial encounter    Nursing notes including past medical history and social history reviewed and considered in documentation   I personally performed the services described in this documentation, which was scribed in my presence. The recorded information has been reviewed and is accurate.      Meghan Rhine, MD 09/23/15 [phone]

## 2015-11-20 ENCOUNTER — Ambulatory Visit
Admission: RE | Admit: 2015-11-20 | Discharge: 2015-11-20 | Disposition: A | Discharge status: Home / Self Care | Payer: Medicare Other | Source: Ambulatory Visit | Attending: Family Medicine | Admitting: Family Medicine

## 2015-11-20 ENCOUNTER — Other Ambulatory Visit: Payer: Self-pay | Admitting: Family Medicine

## 2015-11-20 DIAGNOSIS — R053 Chronic cough: Secondary | ICD-10-CM

## 2015-11-20 DIAGNOSIS — F17200 Nicotine dependence, unspecified, uncomplicated: Secondary | ICD-10-CM

## 2015-11-20 DIAGNOSIS — R05 Cough: Secondary | ICD-10-CM

## 2016-06-20 DIAGNOSIS — K219 Gastro-esophageal reflux disease without esophagitis: Secondary | ICD-10-CM | POA: Insufficient documentation

## 2016-06-20 DIAGNOSIS — R490 Dysphonia: Secondary | ICD-10-CM | POA: Insufficient documentation

## 2016-09-05 DIAGNOSIS — N200 Calculus of kidney: Secondary | ICD-10-CM | POA: Insufficient documentation

## 2016-09-05 DIAGNOSIS — F419 Anxiety disorder, unspecified: Secondary | ICD-10-CM | POA: Insufficient documentation

## 2017-01-14 DIAGNOSIS — Z87442 Personal history of urinary calculi: Secondary | ICD-10-CM | POA: Insufficient documentation

## 2017-04-03 ENCOUNTER — Institutional Professional Consult (permissible substitution): Payer: Medicare Other | Admitting: Emergency Medicine

## 2017-05-05 ENCOUNTER — Encounter: Payer: Self-pay | Admitting: *Deleted

## 2017-05-06 ENCOUNTER — Ambulatory Visit (INDEPENDENT_AMBULATORY_CARE_PROVIDER_SITE_OTHER): Payer: Medicare Other | Admitting: Emergency Medicine

## 2017-05-06 ENCOUNTER — Encounter: Payer: Self-pay | Admitting: Emergency Medicine

## 2017-05-06 VITALS — BP 108/58 | HR 94 | Ht 59.0 in | Wt 95.0 lb

## 2017-05-06 DIAGNOSIS — R0600 Dyspnea, unspecified: Secondary | ICD-10-CM

## 2017-05-06 DIAGNOSIS — Z23 Encounter for immunization: Secondary | ICD-10-CM

## 2017-05-06 DIAGNOSIS — Z720 Tobacco use: Secondary | ICD-10-CM | POA: Insufficient documentation

## 2017-05-06 DIAGNOSIS — J449 Chronic obstructive pulmonary disease, unspecified: Secondary | ICD-10-CM | POA: Insufficient documentation

## 2017-05-06 NOTE — Assessment & Plan Note (Signed)
Discussed cessation with her today. We will need to revisit. I suspect she will need assistance with cutting down before she will be ready to set a quit date. When she is ready we will talk about strategies to successfully stop.

## 2017-05-06 NOTE — Patient Instructions (Signed)
We will perform pulmonary function testing to determine how much COPD might be present You would benefit from decreasing or stopping your smoking. We can continue to talk about this as we go forward.  Follow with Dr Delton Coombes next available with full PFT

## 2017-05-06 NOTE — Assessment & Plan Note (Signed)
Suspect that she does have some degree of COPD based on her recent bronchitis/exacerbation that responded to prednisone, her history of tobacco use. She does not have significant exertional limitation. Before starting her on bronchodilators a believe we should check pulmonary function testing to quantify her degree of obstruction.

## 2017-05-06 NOTE — Progress Notes (Signed)
Subjective:    Patient ID: [name], female    DOB: 02-03-1941, 76 y.o.   MRN: [id number]  HPI 76 year old active smoker (50 pack years), history of esophageal reflux, depression/anxiety, lymphocytic colitis. She is referred today for evaluation of cough and a recent bronchitis, possible COPD.  She had been well and then in June when she had a URI that led to paroxysmal cough, sputum production. Went to urgent care, was treated with pred and improved. She still has some occasional non-prod cough. She is able to exert - does her household chores, gets a bit winded with climbing stairs. No trouble shopping. She can do outside yard work. She is having some hearing disturbance, 'tickle' in her R ear, planning to see Dr Jearld Fenton.    Review of Systems  Constitutional: Negative for fever and unexpected weight change.  HENT: Positive for sneezing. Negative for congestion, dental problem, ear pain, nosebleeds, postnasal drip, rhinorrhea, sinus pressure, sore throat and trouble swallowing.   Eyes: Negative for redness and itching.  Respiratory: Positive for cough. Negative for chest tightness, shortness of breath and wheezing.   Cardiovascular: Negative for palpitations and leg swelling.  Gastrointestinal: Negative for nausea and vomiting.  Genitourinary: Negative for dysuria.  Musculoskeletal: Negative for joint swelling.  Skin: Negative for rash.  Neurological: Negative for headaches.  Hematological: Does not bruise/bleed easily.  Psychiatric/Behavioral: Negative for dysphoric mood. The patient is not nervous/anxious.     No past medical history on file.   Family History  Problem Relation Age of Onset  . Cancer Brother   . Diabetes Brother   . Heart disease Brother   . Cancer Sister   . Diabetes Sister      Social History   Social History  . Marital status: Married    Spouse name: N/A  . Number of children: N/A  . Years of education: N/A   Occupational History  . Not on  file.   Social History Main Topics  . Smoking status: Current Every Day Smoker    Packs/day: 1.00    Years: 50.00  . Smokeless tobacco: Never Used  . Alcohol use No  . Drug use: No  . Sexual activity: Not on file   Other Topics Concern  . Not on file   Social History Narrative  . No narrative on file  She has lived in [place], [place], [place].  She has worked Warehouse manager  There is some water damage and mold in her house > ducts have needed to be replaced.   Allergies  Allergen Reactions  . Bactrim [Sulfamethoxazole-Trimethoprim] Itching     Outpatient Medications Prior to Visit  Medication Sig Dispense Refill  . ALPRAZolam (XANAX) 0.25 MG tablet Take 0.25 mg by mouth as needed. SELDOM TAKE     . escitalopram (LEXAPRO) 20 MG tablet Take 20 mg by mouth daily.      . Multiple Vitamin (MULTIVITAMIN) capsule Take 1 capsule by mouth daily. VIT. B,D,E     . acetaminophen (TYLENOL) 500 MG tablet Take 500 mg by mouth as needed.      . bismuth subsalicylate (PEPTO BISMOL) 262 MG chewable tablet Chew 524 mg by mouth as needed.      Marland Kitchen buPROPion (WELLBUTRIN XL) 150 MG 24 hr tablet Take 150 mg by mouth daily.      . cephALEXin (KEFLEX) 500 MG capsule Take 1 capsule (500 mg total) by mouth 4 (four) times daily. 28 capsule 0  . Dorzolamide HCl-Timolol Mal (COSOPT OP) Apply  10 mLs to eye 2 (two) times daily.      . Latanoprost (XALATAN OP) Apply 2.5 mLs to eye daily.      Marland Kitchen loperamide (IMODIUM A-D) 2 MG tablet Take 2 mg by mouth as needed.      . Loratadine (CLARITIN PO) Take by mouth as needed.      Marland Kitchen omeprazole (PRILOSEC) 20 MG capsule Take 20 mg by mouth as needed.      . Pseudoephedrine HCl (SUDAFED PO) Take 10 mg by mouth as needed.      . raloxifene (EVISTA) 60 MG tablet Take 60 mg by mouth daily.       No facility-administered medications prior to visit.         Objective:   Physical Exam Vitals:   05/06/17 0934 05/06/17 0935  BP:  (!) 108/58  Pulse:  94  SpO2:  97%  Weight: 95 lb (43.1  kg)   Height:  (1.499 m)   Gen: Pleasant, well-nourished, in no distress,  normal affect  ENT: No lesions,  mouth clear,  oropharynx clear, no postnasal drip  Neck: No JVD, no stridor  Lungs: No use of accessory muscles, clear without rales or rhonchi  Cardiovascular: RRR, heart sounds normal, no murmur or gallops, no peripheral edema  Musculoskeletal: No deformities, no cyanosis or clubbing  Neuro: alert, non focal  Skin: Warm, no lesions or rashes     Assessment & Plan:  Tobacco use Discussed cessation with her today. We will need to revisit. I suspect she will need assistance with cutting down before she will be ready to set a quit date. When she is ready we will talk about strategies to successfully stop.  COPD (chronic obstructive pulmonary disease) (HCC) Suspect that she does have some degree of COPD based on her recent bronchitis/exacerbation that responded to prednisone, her history of tobacco use. She does not have significant exertional limitation. Before starting her on bronchodilators a believe we should check pulmonary function testing to quantify her degree of obstruction.  Levy Pupa, MD, PhD 05/06/2017, 11:05 AM [facility] [phone] or if no answer [phone]

## 2017-05-16 ENCOUNTER — Ambulatory Visit (INDEPENDENT_AMBULATORY_CARE_PROVIDER_SITE_OTHER): Payer: Medicare Other | Admitting: Emergency Medicine

## 2017-05-16 ENCOUNTER — Encounter: Payer: Self-pay | Admitting: Emergency Medicine

## 2017-05-16 VITALS — BP 110/60 | HR 98 | Ht 59.0 in | Wt 96.0 lb

## 2017-05-16 DIAGNOSIS — Z87891 Personal history of nicotine dependence: Secondary | ICD-10-CM

## 2017-05-16 DIAGNOSIS — J449 Chronic obstructive pulmonary disease, unspecified: Secondary | ICD-10-CM

## 2017-05-16 DIAGNOSIS — Z122 Encounter for screening for malignant neoplasm of respiratory organs: Secondary | ICD-10-CM | POA: Diagnosis not present

## 2017-05-16 DIAGNOSIS — Z720 Tobacco use: Secondary | ICD-10-CM | POA: Diagnosis not present

## 2017-05-16 DIAGNOSIS — R0600 Dyspnea, unspecified: Secondary | ICD-10-CM

## 2017-05-16 LAB — PULMONARY FUNCTION TEST
DL/VA % pred: -16 %
DL/VA: -0.68 ml/min/mmHg/L
DLCO cor % pred: -112 %
DLCO cor: -19.76 ml/min/mmHg
DLCO unc % pred: -109 %
DLCO unc: -19.19 ml/min/mmHg
FEF 25-75 Post: 2.08 L/sec
FEF 25-75 Pre: 1.69 L/sec
FEF2575-%Change-Post: 23 %
FEF2575-%Pred-Post: 166 %
FEF2575-%Pred-Pre: 135 %
FEV1-%Change-Post: 3 %
FEV1-%Pred-Post: 93 %
FEV1-%Pred-Pre: 90 %
FEV1-Post: 1.47 L
FEV1-Pre: 1.43 L
FEV1FVC-%Change-Post: 3 %
FEV1FVC-%Pred-Pre: 113 %
FEV6-%Change-Post: 0 %
FEV6-%Pred-Post: 84 %
FEV6-%Pred-Pre: 84 %
FEV6-Post: 1.7 L
FEV6-Pre: 1.7 L
FEV6FVC-%Pred-Post: 106 %
FEV6FVC-%Pred-Pre: 106 %
FVC-%Change-Post: 0 %
FVC-%Pred-Post: 80 %
FVC-%Pred-Pre: 80 %
FVC-Post: 1.7 L
FVC-Pre: 1.7 L
Post FEV1/FVC ratio: 87 %
Post FEV6/FVC ratio: 100 %
Pre FEV1/FVC ratio: 84 %
Pre FEV6/FVC Ratio: 100 %
RV % pred: 99 %
RV: 2.08 L
TLC % pred: 85 %
TLC: 3.66 L

## 2017-05-16 NOTE — Assessment & Plan Note (Signed)
Surprisingly her pulmonary function testing shows no significant obstruction.  This is despite a long tobacco history.  I reassured her today that this was good news.  I will defer any bronchodilators.  We did talk about having an albuterol available to use in cases of difficulty.  She will think about this.  Wants to defer for now.  Her flu shot is up-to-date

## 2017-05-16 NOTE — Progress Notes (Signed)
Subjective:    Patient ID: [name], female    DOB: [birth year], 76 y.o.   MRN: [id number]  Shortness of Breath  Pertinent negatives include no ear pain, fever, headaches, leg swelling, rash, rhinorrhea, sore throat, vomiting or wheezing.   76 year old active smoker (50 pack years), history of esophageal reflux, depression/anxiety, lymphocytic colitis. She is referred today for evaluation of cough and a recent bronchitis, possible COPD.  She had been well and then in June when she had a URI that led to paroxysmal cough, sputum production. Went to urgent care, was treated with pred and improved. She still has some occasional non-prod cough. She is able to exert - does her household chores, gets a bit winded with climbing stairs. No trouble shopping. She can do outside yard work. She is having some hearing disturbance, 'tickle' in her R ear, planning to see Dr Jearld Fenton.   ROV 05/16/17 --this is a follow-up visit for patient with a history of tobacco use, cough, suspected COPD based on a recent episode of bronchitis. She underwent PFT today that I reviewed > normal airflows and volumes. She couldn't do DLCO. She feels back to normal, has some cough. She is interested in [facility] screening, at age 27 she is good for one year. Then we could follow any abnormality if present in office    Review of Systems  Constitutional: Negative for fever and unexpected weight change.  HENT: Positive for sneezing. Negative for congestion, dental problem, ear pain, nosebleeds, postnasal drip, rhinorrhea, sinus pressure, sore throat and trouble swallowing.   Eyes: Negative for redness and itching.  Respiratory: Positive for cough and shortness of breath. Negative for chest tightness and wheezing.   Cardiovascular: Negative for palpitations and leg swelling.  Gastrointestinal: Negative for nausea and vomiting.  Genitourinary: Negative for dysuria.  Musculoskeletal: Negative for joint swelling.  Skin: Negative for  rash.  Neurological: Negative for headaches.  Hematological: Does not bruise/bleed easily.  Psychiatric/Behavioral: Negative for dysphoric mood. The patient is not nervous/anxious.     No past medical history on file.   Family History  Problem Relation Age of Onset  . Cancer Brother   . Diabetes Brother   . Heart disease Brother   . Cancer Sister   . Diabetes Sister      Social History   Social History  . Marital status: Married    Spouse name: N/A  . Number of children: N/A  . Years of education: N/A   Occupational History  . Not on file.   Social History Main Topics  . Smoking status: Current Every Day Smoker    Packs/day: 1.00    Years: 50.00  . Smokeless tobacco: Never Used  . Alcohol use No  . Drug use: No  . Sexual activity: Not on file   Other Topics Concern  . Not on file   Social History Narrative  . No narrative on file  She has lived in [place], [place], [place].  She has worked Warehouse manager  There is some water damage and mold in her house > ducts have needed to be replaced.   Allergies  Allergen Reactions  . Bactrim [Sulfamethoxazole-Trimethoprim] Itching     Outpatient Medications Prior to Visit  Medication Sig Dispense Refill  . ALPRAZolam (XANAX) 0.25 MG tablet Take 0.25 mg by mouth as needed. SELDOM TAKE     . cholecalciferol (VITAMIN D) 1000 units tablet Take 1,000 Units by mouth daily.    Marland Kitchen escitalopram (LEXAPRO) 20 MG tablet Take  20 mg by mouth daily.      . Multiple Vitamin (MULTIVITAMIN) capsule Take 1 capsule by mouth daily. VIT. B,D,E     . raloxifene (EVISTA) 60 MG tablet Take 60 mg by mouth daily.    . vitamin B-12 (CYANOCOBALAMIN) 1000 MCG tablet Take 1,000 mcg by mouth daily.    . vitamin E 400 UNIT capsule Take 400 Units by mouth daily.     No facility-administered medications prior to visit.         Objective:   Physical Exam Vitals:   05/16/17 1104 05/16/17 1107  BP:  110/60  Pulse:  98  SpO2:  97%  Weight: 96 lb (43.5 kg)     Height: 4\' 11"  (1.499 m)   Gen: Pleasant, well-nourished, in no distress,  normal affect  ENT: No lesions,  mouth clear,  oropharynx clear, no postnasal drip  Neck: No JVD, no stridor  Lungs: No use of accessory muscles, clear without rales or rhonchi  Cardiovascular: RRR, heart sounds normal, no murmur or gallops, no peripheral edema  Musculoskeletal: No deformities, no cyanosis or clubbing  Neuro: alert, non focal  Skin: Warm, no lesions or rashes     Assessment & Plan:  COPD (chronic obstructive pulmonary disease) (HCC) Surprisingly her pulmonary function testing shows no significant obstruction.  This is despite a long tobacco history.  I reassured her today that this was good news.  I will defer any bronchodilators.  We did talk about having an albuterol available to use in cases of difficulty.  She will think about this.  Wants to defer for now.  Her flu shot is up-to-date  Tobacco use We talked about smoking cessation today.  She is not ready to set a quit date.  She would like to be evaluated for low-dose lung cancer screening CT scan.  She is [age] and would qualify for at least one year.  If the nodule is found I could follow this in office.  Levy Pupaobert Telly Broberg, MD, PhD 05/16/2017, 11:26 AM [facility] [phone] or if no answer [phone]

## 2017-05-16 NOTE — Progress Notes (Signed)
PFT completed today.Jhalil Silvera,CMA  

## 2017-05-16 NOTE — Assessment & Plan Note (Signed)
We talked about smoking cessation today.  She is not ready to set a quit date.  She would like to be evaluated for low-dose lung cancer screening CT scan.  She is [age] and would qualify for at least one year.  If the nodule is found I could follow this in office.

## 2017-05-16 NOTE — Patient Instructions (Addendum)
Try starting loratadine 10mg  daily during the Fall and Spring months.  We will refer you for Lung Cancer Screening.  You need to work on stopping smoking Flu shot up-to-date Follow with Dr Delton CoombesByrum in 12 months or sooner if you have any problems

## 2017-05-26 ENCOUNTER — Telehealth: Payer: Self-pay | Admitting: Acute Care

## 2017-05-26 DIAGNOSIS — F17210 Nicotine dependence, cigarettes, uncomplicated: Principal | ICD-10-CM

## 2017-05-26 DIAGNOSIS — Z122 Encounter for screening for malignant neoplasm of respiratory organs: Secondary | ICD-10-CM

## 2017-05-27 NOTE — Telephone Encounter (Signed)
Spoke with pt and scheduled SDMV 06/04/17 11:30 CT ordered Nothing further needed

## 2017-05-27 NOTE — Telephone Encounter (Signed)
Will forward to the lung nodule pool 

## 2017-06-02 ENCOUNTER — Telehealth: Payer: Self-pay | Admitting: Acute Care

## 2017-06-02 NOTE — Telephone Encounter (Signed)
Angelique BlonderDenise, could you please see why this patient wants to cancel her appointment and see if she wants to re-schedule. Thanks so much.

## 2017-06-02 NOTE — Telephone Encounter (Signed)
SG ok to cancel or did you want to talk with pt?

## 2017-06-03 DIAGNOSIS — H60333 Swimmer's ear, bilateral: Secondary | ICD-10-CM | POA: Insufficient documentation

## 2017-06-03 NOTE — Telephone Encounter (Signed)
LMTC x 1 - Does pt want to reschedule?

## 2017-06-04 ENCOUNTER — Inpatient Hospital Stay: Admission: RE | Admit: 2017-06-04 | Payer: Medicare Other | Source: Ambulatory Visit

## 2017-06-04 ENCOUNTER — Encounter: Payer: Medicare Other | Admitting: Acute Care

## 2017-06-05 ENCOUNTER — Telehealth: Payer: Self-pay | Admitting: Acute Care

## 2017-06-06 NOTE — Telephone Encounter (Signed)
LMTC x 1  

## 2017-06-06 NOTE — Telephone Encounter (Signed)
Please see phone note 06/02/17 - will close this message

## 2017-06-06 NOTE — Telephone Encounter (Signed)
[facility] will be rescheduled Nothing further needed

## 2017-06-18 ENCOUNTER — Ambulatory Visit (INDEPENDENT_AMBULATORY_CARE_PROVIDER_SITE_OTHER): Payer: Medicare Other | Admitting: Acute Care

## 2017-06-18 ENCOUNTER — Ambulatory Visit (INDEPENDENT_AMBULATORY_CARE_PROVIDER_SITE_OTHER)
Admission: RE | Admit: 2017-06-18 | Discharge: 2017-06-18 | Disposition: A | Discharge status: Home / Self Care | Payer: Medicare Other | Source: Ambulatory Visit | Attending: Acute Care | Admitting: Acute Care

## 2017-06-18 ENCOUNTER — Encounter: Payer: Self-pay | Admitting: Acute Care

## 2017-06-18 DIAGNOSIS — Z87891 Personal history of nicotine dependence: Secondary | ICD-10-CM

## 2017-06-18 DIAGNOSIS — F17210 Nicotine dependence, cigarettes, uncomplicated: Secondary | ICD-10-CM

## 2017-06-18 DIAGNOSIS — Z122 Encounter for screening for malignant neoplasm of respiratory organs: Secondary | ICD-10-CM

## 2017-06-18 NOTE — Progress Notes (Signed)
Shared Decision Making Visit Lung Cancer Screening Program [phone]([id number])   Eligibility:  Age 76 y.o.  Pack Years Smoking History Calculation 57 pack year smoking history (# packs/per year x # years smoked)  Recent History of coughing up blood  no  Unexplained weight loss? no ( >Than 15 pounds within the last 6 months )  Prior History Lung / other cancer no (Diagnosis within the last 5 years already requiring surveillance chest CT Scans).  Smoking Status Current Smoker  Former Smokers: Years since quit: NA  Quit Date: NA  Visit Components:  Discussion included one or more decision making aids. yes  Discussion included risk/benefits of screening. yes  Discussion included potential follow up diagnostic testing for abnormal scans. yes  Discussion included meaning and risk of over diagnosis. yes  Discussion included meaning and risk of False Positives. yes  Discussion included meaning of total radiation exposure. yes  Counseling Included:  Importance of adherence to annual lung cancer LDCT screening. yes  Impact of comorbidities on ability to participate in the program. yes  Ability and willingness to under diagnostic treatment. yes  Smoking Cessation Counseling:  Current Smokers:   Discussed importance of smoking cessation. yes  Information about tobacco cessation classes and interventions provided to patient. yes  Patient provided with "ticket" for LDCT Scan. yes  Symptomatic Patient. no  Counseling  Diagnosis Code: Tobacco Use Z72.0  Asymptomatic Patient yes  Counseling (Intermediate counseling: > three minutes counseling) [id number]  Former Smokers:   Discussed the importance of maintaining cigarette abstinence. yes  Diagnosis Code: Personal History of Nicotine Dependence. U98.119Z87.891  Information about tobacco cessation classes and interventions provided to patient. Yes  Patient provided with "ticket" for LDCT Scan. yes  Written Order for Lung Cancer  Screening with LDCT placed in Epic. Yes (CT Chest Lung Cancer Screening Low Dose W/O CM) [id number] Z12.2-Screening of respiratory organs Z87.891-Personal history of nicotine dependence  I have spent 25 minutes of face to face time with Ms. Joanne GavelSutton  discussing the risks and benefits of lung cancer screening. We viewed a power point together that explained in detail the above noted topics. We paused at intervals to allow for questions to be asked and answered to ensure understanding.We discussed that the single most powerful action that she can take to decrease her risk of developing lung cancer is to quit smoking. We discussed whether or not she is ready to commit to setting a quit date.She is not ready to set a quit date. We discussed options for tools to aid in quitting smoking including nicotine replacement therapy, non-nicotine medications, support groups, Quit Smart classes, and behavior modification. We discussed that often times setting smaller, more achievable goals, such as eliminating 1 cigarette a day for a week and then 2 cigarettes a day for a week can be helpful in slowly decreasing the number of cigarettes smoked. This allows for a sense of accomplishment as well as providing a clinical benefit. I gave her  the " Be Stronger Than Your Excuses" card with contact information for community resources, classes, free nicotine replacement therapy, and access to mobile apps, text messaging, and on-line smoking cessation help. I have also given her my card and contact information in the event she needs to contact me. We discussed the time and location of the scan, and that either Abigail Miyamotoenise Phelps RN or I will call with the results within 24-48 hours of receiving them. I have offered her  a copy of the power point we viewed  as a resource in the event they need reinforcement of the concepts we discussed today in the office. The patient verbalized understanding of all of  the above and had no further questions  upon leaving the office. They have my contact information in the event they have any further questions.  I spent 4 minutes counseling on smoking cessation and the health risks of continued tobacco abuse.  I explained to the patient that there has been a high incidence of coronary artery disease noted on these exams. I explained that this is a non-gated exam therefore degree or severity cannot be determined. This patient is not on statin therapy. I have asked the patient to follow-up with their PCP regarding any incidental finding of coronary artery disease and management with diet or medication as their PCP  feels is clinically indicated. The patient verbalized understanding of the above and had no further questions upon completion of the visit.     Bevelyn NgoSarah F Joneric Streight, NP 06/18/2017

## 2017-06-25 ENCOUNTER — Telehealth: Payer: Self-pay | Admitting: Acute Care

## 2017-06-26 NOTE — Telephone Encounter (Signed)
Pt informed of CT results per Kandice RobinsonsSarah Groce, NP.  PT verbalized understanding.  Copy sent to PCP.  Pt is [age] years old and will not be eligible for screening any longer per Lung screening guidelines.  Pt verbalized understanding.

## 2017-09-09 DIAGNOSIS — M5030 Other cervical disc degeneration, unspecified cervical region: Secondary | ICD-10-CM | POA: Insufficient documentation

## 2017-09-09 DIAGNOSIS — M4312 Spondylolisthesis, cervical region: Secondary | ICD-10-CM | POA: Insufficient documentation

## 2018-04-17 DIAGNOSIS — M4150 Other secondary scoliosis, site unspecified: Secondary | ICD-10-CM | POA: Insufficient documentation

## 2018-04-17 DIAGNOSIS — M461 Sacroiliitis, not elsewhere classified: Secondary | ICD-10-CM | POA: Insufficient documentation

## 2018-04-17 DIAGNOSIS — M51369 Other intervertebral disc degeneration, lumbar region without mention of lumbar back pain or lower extremity pain: Secondary | ICD-10-CM | POA: Insufficient documentation

## 2018-08-11 ENCOUNTER — Other Ambulatory Visit: Payer: Self-pay | Admitting: Family Medicine

## 2018-08-11 DIAGNOSIS — Z1231 Encounter for screening mammogram for malignant neoplasm of breast: Secondary | ICD-10-CM

## 2018-09-07 ENCOUNTER — Ambulatory Visit
Admission: RE | Admit: 2018-09-07 | Discharge: 2018-09-07 | Disposition: A | Discharge status: Home / Self Care | Payer: Medicare Other | Source: Ambulatory Visit | Attending: Family Medicine | Admitting: Family Medicine

## 2018-09-07 DIAGNOSIS — Z1231 Encounter for screening mammogram for malignant neoplasm of breast: Secondary | ICD-10-CM

## 2019-02-22 DIAGNOSIS — R319 Hematuria, unspecified: Secondary | ICD-10-CM | POA: Insufficient documentation

## 2019-06-21 ENCOUNTER — Ambulatory Visit
Admission: EM | Admit: 2019-06-21 | Discharge: 2019-06-21 | Disposition: A | Discharge status: Home / Self Care | Payer: Medicare Other

## 2019-06-21 ENCOUNTER — Other Ambulatory Visit: Payer: Self-pay

## 2019-06-21 ENCOUNTER — Encounter: Payer: Self-pay | Admitting: Emergency Medicine

## 2019-06-21 DIAGNOSIS — F17200 Nicotine dependence, unspecified, uncomplicated: Secondary | ICD-10-CM

## 2019-06-21 DIAGNOSIS — K145 Plicated tongue: Secondary | ICD-10-CM

## 2019-06-21 NOTE — ED Triage Notes (Signed)
Pt presents to [facility] for assessment of tongue irritation x 2 weeks after being seen at the dentist for front lower gum pain.  Was given an antibiotic and a mouth wash.  States the mouthwash seemed to have made everything worse.

## 2019-06-21 NOTE — Discharge Instructions (Addendum)
Take tylenol daily. Brush tongue GENTLY. May use warm water.

## 2019-06-21 NOTE — ED Notes (Signed)
Patient able to ambulate independently  

## 2019-06-21 NOTE — ED Provider Notes (Signed)
[name]    CSN: [id number] Arrival date & time: 06/21/19  0919      History   Chief Complaint Chief Complaint  Patient presents with  . Dental Pain    HPI [name] is a 78 y.o. female resenting for 2-week course of tongue irritation.  Patient states that she was seen at the dentist prior to symptom onset and given antibiotic for front lower gum swelling/pain.  Patient finished her course yesterday: Reporting improvement in dental pain.  Patient states that she was given a mouthwash at that time as well, though it caused her whole mouth to burn so she discontinued use.  Patient does smoke currently, denies alcohol use.  Has not tried anything for this.   History reviewed. No pertinent past medical history.  Patient Active Problem List   Diagnosis Date Noted  . Tobacco use 05/06/2017  . COPD (chronic obstructive pulmonary disease) (HCC) 05/06/2017    Past Surgical History:  Procedure Laterality Date  . CHOLECYSTECTOMY  10/24/2010  . COLECTOMY  2006   BENIGN TUMOR    OB History   No obstetric history on file.      Home Medications    Prior to Admission medications   Medication Sig Start Date End Date Taking? Authorizing Provider  ALPRAZolam (XANAX) 0.25 MG tablet Take 0.25 mg by mouth as needed. SELDOM TAKE     [provider]  cholecalciferol (VITAMIN D) 1000 units tablet Take 1,000 Units by mouth daily.    [provider]  escitalopram (LEXAPRO) 20 MG tablet Take 20 mg by mouth daily.      [provider]  Multiple Vitamin (MULTIVITAMIN) capsule Take 1 capsule by mouth daily. VIT. B,D,E     [provider]  raloxifene (EVISTA) 60 MG tablet Take 60 mg by mouth daily.    [provider]  vitamin B-12 (CYANOCOBALAMIN) 1000 MCG tablet Take 1,000 mcg by mouth daily.    [provider]  vitamin E 400 UNIT capsule Take 400 Units by mouth daily.    [provider]    Family History  Family History  Problem Relation Age of Onset  . Cancer Brother   . Diabetes Brother   . Heart disease Brother   . Cancer Sister   . Diabetes Sister   . Breast cancer Neg Hx     Social History Social History   Tobacco Use  . Smoking status: Current Every Day Smoker    Packs/day: 1.00    Years: 50.00    Pack years: 50.00  . Smokeless tobacco: Never Used  Substance Use Topics  . Alcohol use: No  . Drug use: No     Allergies   Bactrim [sulfamethoxazole-trimethoprim]   Review of Systems Review of Systems  Constitutional: Negative for fatigue and fever.  HENT: Negative for congestion, dental problem, drooling, ear pain, facial swelling, hearing loss, sinus pain, sore throat, trouble swallowing and voice change.   Eyes: Negative for photophobia, pain and visual disturbance.  Respiratory: Negative for cough and shortness of breath.   Cardiovascular: Negative for chest pain and palpitations.  Gastrointestinal: Negative for diarrhea and vomiting.  Musculoskeletal: Negative for arthralgias and myalgias.  Neurological: Negative for dizziness and headaches.     Physical Exam Triage Vital Signs ED Triage Vitals  Enc Vitals Group     BP      Pulse      Resp      Temp  Temp src      SpO2      Weight      Height      Head Circumference      Peak Flow      Pain Score      Pain Loc      Pain Edu?      Excl. in [place]?    No data found.  Updated Vital Signs BP (!) 99/53 (BP Location: Left Arm)   Pulse 84   Temp 98.6 F (37 C) (Oral)   Resp 16   SpO2 97%   Visual Acuity Right Eye Distance:   Left Eye Distance:   Bilateral Distance:    Right Eye Near:   Left Eye Near:    Bilateral Near:     Physical Exam Constitutional:      General: She is not in acute distress.    Appearance: She is normal weight. She is not ill-appearing.  HENT:     Head: Normocephalic and atraumatic.     Mouth/Throat:     Mouth: Mucous membranes are moist.     Pharynx:  Oropharynx is clear.     Comments: Tongue fissures noted without surrounding erythema, edema, lesions, discharge Eyes:     General: No scleral icterus.    Pupils: Pupils are equal, round, and reactive to light.  Cardiovascular:     Rate and Rhythm: Normal rate and regular rhythm.  Pulmonary:     Effort: Pulmonary effort is normal. No respiratory distress.     Breath sounds: No wheezing.  Skin:    Capillary Refill: Capillary refill takes less than 2 seconds.     Coloration: Skin is not jaundiced or pale.  Neurological:     General: No focal deficit present.     Mental Status: She is alert and oriented to person, place, and time.      UC Treatments / Results  Labs (all labs ordered are listed, but only abnormal results are displayed) Labs Reviewed - No data to display  EKG   Radiology No results found.  Procedures Procedures (including critical care time)  Medications Ordered in UC Medications - No data to display  Initial Impression / Assessment and Plan / UC Course  I have reviewed the triage vital signs and the nursing notes.  Pertinent labs & imaging results that were available during my care of the patient were reviewed by me and considered in my medical decision making (see chart for details).     Tongue fissure without concerning findings: Reviewed conservative management as outlined below.  Patient to follow-up with dentist for persistent, worsening symptoms.  Return precautions discussed, patient verbalized understanding and is agreeable to plan. Final Clinical Impressions(s) / UC Diagnoses   Final diagnoses:  Tongue fissure     Discharge Instructions     Take tylenol daily. Brush tongue GENTLY. May use warm water.    ED Prescriptions    None     PDMP not reviewed this encounter.   Hall-Potvin, [place], [place] 06/21/19 1009

## 2020-01-25 ENCOUNTER — Ambulatory Visit: Payer: Medicare PPO | Admitting: Allergy and Immunology

## 2020-01-25 ENCOUNTER — Encounter: Payer: Self-pay | Admitting: Allergy and Immunology

## 2020-01-25 ENCOUNTER — Other Ambulatory Visit: Payer: Self-pay

## 2020-01-25 VITALS — BP 108/48 | HR 85 | Temp 98.3°F | Resp 20 | Ht 59.09 in | Wt 94.8 lb

## 2020-01-25 DIAGNOSIS — L500 Allergic urticaria: Secondary | ICD-10-CM

## 2020-01-25 DIAGNOSIS — J3089 Other allergic rhinitis: Secondary | ICD-10-CM | POA: Diagnosis not present

## 2020-01-25 DIAGNOSIS — Z720 Tobacco use: Secondary | ICD-10-CM | POA: Diagnosis not present

## 2020-01-25 MED ORDER — FEXOFENADINE HCL 60 MG PO TABS: ORAL_TABLET | ORAL | 5 refills | Status: DC

## 2020-01-25 MED ORDER — FLUTICASONE PROPIONATE 50 MCG/ACT NA SUSP
NASAL | 1 refills | Status: DC
Start: 2020-01-25 — End: 2024-01-30

## 2020-01-25 NOTE — Progress Notes (Signed)
New Patient Note  RE: [name] MRN: [id number] DOB: 16-Oct-1940 Date of Office Visit: 01/25/2020  Referring provider: Maurice Small, MD Primary care provider: Maurice Small, MD  Chief Complaint: Rash   History of present illness: [name] is a 79 y.o. female seen today in consultation requested by Maurice Small, MD.  She reports that approximately 1 month ago she began to develop hives on her arms, legs, and back.  The hives are red, raised, and somewhat pruritic.  She does not experience concomitant angioedema, cardiopulmonary symptoms, or GI symptoms.  However, she does report that approximately 1 year ago her "face just completely swelled up."  She reports that the angioedema occurred on at least 2 occasions and on both occasions resolved within a day or 2 without intervention beyond antihistamines.  She did not experience concomitant cardiopulmonary or GI symptoms.  No specific medication, food, skin care product, detergent, soap, or other environmental triggers have been identified. [name] experiences nasal congestion, rhinorrhea, and sneezing.  No significant seasonal symptom variation has been noted nor have specific environmental triggers been identified.  Assessment and plan: Allergic urticaria Unclear etiology.  May be related to insect bites.  Skin tests to select food allergens were negative today. NSAIDs and emotional stress commonly exacerbate urticaria but are not the underlying etiology in this case. Physical urticarias are negative by history (i.e. pressure-induced, temperature, vibration, solar, etc.). History and lesions are not consistent with urticaria pigmentosa so I am not suspicious for mastocytosis. There are no concomitant symptoms concerning for anaphylaxis or constitutional symptoms worrisome for an underlying malignancy.   We will not order labs at this time, however, if lesions recur, persist, progress, or change in character, we will assess  potential etiologies with screening labs. Fexofenadine (Allegra) 60 mg 1-2 times daily if needed.  Should symptoms recur, a journal is to be kept recording any foods eaten, beverages consumed, medications taken within a 6 hour period prior to the onset of symptoms, as well as record activities being performed, and environmental conditions. For any symptoms concerning for anaphylaxis, 911 is to be called immediately.  Perennial allergic rhinitis  Aeroallergen avoidance measures have been discussed and provided in written form.  Fexofenadine/Allegra (as above).  A prescription has been provided for fluticasone nasal spray, 1 to 2 sprays per nostril daily if needed. Proper nasal spray technique has been discussed and demonstrated.  Nasal saline spray (i.e. Simply Saline) is recommended prior to medicated nasal sprays and as needed.  Tobacco use  Tobacco cessation has been discussed and encouraged.   Meds ordered this encounter  Medications  . fexofenadine (ALLEGRA) 60 MG tablet    Sig: 1-2 times daily if needed    Dispense:  60 tablet    Refill:  5  . fluticasone (FLONASE) 50 MCG/ACT nasal spray    Sig: 1-2 sprays per nostril daily if needed    Dispense:  16 g    Refill:  1    Diagnostics: Environmental skin testing: Positive to molds and dog epithelia. Food allergen skin testing: Negative despite a positive histamine control.    Physical examination: Blood pressure (!) 108/48, pulse 85, temperature 98.3 F (36.8 C), temperature source Oral, resp. rate 20, height 4' 11.09" (1.501 m), weight 94 lb 12.8 oz (43 kg), SpO2 96 %.  General: Alert, interactive, in no acute distress. HEENT: TMs pearly gray, turbinates mildly edematous without discharge, post-pharynx mildly erythematous. Neck: Supple without lymphadenopathy. Lungs: Clear to auscultation without wheezing, rhonchi  or rales. CV: Normal S1, S2 without murmurs. Abdomen: Nondistended, nontender. Skin: Erythematous  urticarial type lesions located on the right forearm and left ankle , nonvesicular. Extremities:  No clubbing, cyanosis or edema. Neuro:   Grossly intact.  Review of systems:  Review of systems negative except as noted in HPI / PMHx or noted below: Review of Systems  Constitutional: Negative.   HENT: Negative.   Eyes: Negative.   Respiratory: Negative.   Cardiovascular: Negative.   Gastrointestinal: Negative.   Genitourinary: Negative.   Musculoskeletal: Negative.   Skin: Negative.   Neurological: Negative.   Endo/Heme/Allergies: Negative.   Psychiatric/Behavioral: Negative.     Past medical history:  History reviewed. No pertinent past medical history.  Past surgical history:  Past Surgical History:  Procedure Laterality Date  . CHOLECYSTECTOMY  10/24/2010  . COLECTOMY  2006   BENIGN TUMOR    Family history: Family History  Problem Relation Age of Onset  . Cancer Brother   . Diabetes Brother   . Heart disease Brother   . Cancer Sister   . Diabetes Sister   . Breast cancer Neg Hx     Social history: Social History   Socioeconomic History  . Marital status: Married    Spouse name: Not on file  . Number of children: Not on file  . Years of education: Not on file  . Highest education level: Not on file  Occupational History  . Not on file  Tobacco Use  . Smoking status: Current Every Day Smoker    Packs/day: 1.00    Years: 50.00    Pack years: 50.00  . Smokeless tobacco: Never Used  Vaping Use  . Vaping Use: Never used  Substance and Sexual Activity  . Alcohol use: No  . Drug use: No  . Sexual activity: Not on file  Other Topics Concern  . Not on file  Social History Narrative  . Not on file   Social Determinants of Health   Financial Resource Strain:   . Difficulty of Paying Living Expenses:   Food Insecurity:   . Worried About Programme researcher, broadcasting/film/video in the Last Year:   . Barista in the Last Year:   Transportation Needs:   . Automotive engineer (Medical):   Marland Kitchen Lack of Transportation (Non-Medical):   Physical Activity:   . Days of Exercise per Week:   . Minutes of Exercise per Session:   Stress:   . Feeling of Stress :   Social Connections:   . Frequency of Communication with Friends and Family:   . Frequency of Social Gatherings with Friends and Family:   . Attends Religious Services:   . Active Member of Clubs or Organizations:   . Attends Banker Meetings:   Marland Kitchen Marital Status:   Intimate Partner Violence:   . Fear of Current or Ex-Partner:   . Emotionally Abused:   Marland Kitchen Physically Abused:   . Sexually Abused:     Environmental History: The patient lives in a 80 year old house with hardwood floors throughout and central air/heat.  There is mold/water damage in the home.  There are cats in the home which have access to his bedroom.   She smokes 1 pack of cigarettes per day on average.   Current Outpatient Medications  Medication Sig Dispense Refill  . Cholecalciferol (VITAMIN D3 PO) Take by mouth daily.    Marland Kitchen escitalopram (LEXAPRO) 20 MG tablet Take 20 mg by mouth daily.      Marland Kitchen  Multiple Vitamin (MULTIVITAMIN) capsule Take 1 capsule by mouth daily. VIT. B,D,E     . Probiotic Product (PROBIOTIC PO) Take by mouth daily.    . raloxifene (EVISTA) 60 MG tablet Take 60 mg by mouth daily.    . vitamin B-12 (CYANOCOBALAMIN) 500 MCG tablet Take 500 mcg by mouth daily.    . vitamin E 400 UNIT capsule Take 400 Units by mouth daily.    . fexofenadine (ALLEGRA) 60 MG tablet 1-2 times daily if needed 60 tablet 5  . fluticasone (FLONASE) 50 MCG/ACT nasal spray 1-2 sprays per nostril daily if needed 16 g 1   No current facility-administered medications for this visit.    Known medication allergies: Allergies  Allergen Reactions  . Sulfamethoxazole-Trimethoprim Itching    I appreciate the opportunity to take part in [name]'s care. Please do not hesitate to contact me with questions.  Sincerely,   R.  Jorene Guest, MD

## 2020-01-25 NOTE — Assessment & Plan Note (Signed)
Unclear etiology.  May be related to insect bites.  Skin tests to select food allergens were negative today. NSAIDs and emotional stress commonly exacerbate urticaria but are not the underlying etiology in this case. Physical urticarias are negative by history (i.e. pressure-induced, temperature, vibration, solar, etc.). History and lesions are not consistent with urticaria pigmentosa so I am not suspicious for mastocytosis. There are no concomitant symptoms concerning for anaphylaxis or constitutional symptoms worrisome for an underlying malignancy.   We will not order labs at this time, however, if lesions recur, persist, progress, or change in character, we will assess potential etiologies with screening labs. Fexofenadine (Allegra) 60 mg 1-2 times daily if needed.  Should symptoms recur, a journal is to be kept recording any foods eaten, beverages consumed, medications taken within a 6 hour period prior to the onset of symptoms, as well as record activities being performed, and environmental conditions. For any symptoms concerning for anaphylaxis, 911 is to be called immediately.

## 2020-01-25 NOTE — Patient Instructions (Addendum)
Allergic urticaria Unclear etiology.  May be related to insect bites.  Skin tests to select food allergens were negative today. NSAIDs and emotional stress commonly exacerbate urticaria but are not the underlying etiology in this case. Physical urticarias are negative by history (i.e. pressure-induced, temperature, vibration, solar, etc.). History and lesions are not consistent with urticaria pigmentosa so I am not suspicious for mastocytosis. There are no concomitant symptoms concerning for anaphylaxis or constitutional symptoms worrisome for an underlying malignancy.   We will not order labs at this time, however, if lesions recur, persist, progress, or change in character, we will assess potential etiologies with screening labs. Fexofenadine (Allegra) 60 mg 1-2 times daily if needed.  Should symptoms recur, a journal is to be kept recording any foods eaten, beverages consumed, medications taken within a 6 hour period prior to the onset of symptoms, as well as record activities being performed, and environmental conditions. For any symptoms concerning for anaphylaxis, 911 is to be called immediately.  Perennial allergic rhinitis  Aeroallergen avoidance measures have been discussed and provided in written form.  Fexofenadine/Allegra (as above).  A prescription has been provided for fluticasone nasal spray, 1 to 2 sprays per nostril daily if needed. Proper nasal spray technique has been discussed and demonstrated.  Nasal saline spray (i.e. Simply Saline) is recommended prior to medicated nasal sprays and as needed.  Tobacco use  Tobacco cessation has been discussed and encouraged.   Return if symptoms worsen or fail to improve.  Control of Mold Allergen  Mold and fungi can grow on a variety of surfaces provided certain temperature and moisture conditions exist.  Outdoor molds grow on plants, decaying vegetation and soil.  The major outdoor mold, Alternaria and Cladosporium, are found in  very high numbers during hot and dry conditions.  Generally, a late Summer - Fall peak is seen for common outdoor fungal spores.  Rain will temporarily lower outdoor mold spore count, but counts rise rapidly when the rainy period ends.  The most important indoor molds are Aspergillus and Penicillium.  Dark, humid and poorly ventilated basements are ideal sites for mold growth.  The next most common sites of mold growth are the bathroom and the kitchen.  Outdoor [employer] 1. Use air conditioning and keep windows closed 2. Avoid exposure to decaying vegetation. 3. Avoid leaf raking. 4. Avoid grain handling. 5. Consider wearing a face mask if working in moldy areas.  Indoor Mold Control 1. Maintain humidity below 50%. 2. Clean washable surfaces with 5% bleach solution. 3. Remove sources e.g. Contaminated carpets.  Control of Dog or Cat Allergen  Avoidance is the best way to manage a dog or cat allergy. If you have a dog or cat and are allergic to dog or cats, consider removing the dog or cat from the home. If you have a dog or cat but don't want to find it a new home, or if your family wants a pet even though someone in the household is allergic, here are some strategies that may help keep symptoms at bay:  1. Keep the pet out of your bedroom and restrict it to only a few rooms. Be advised that keeping the dog or cat in only one room will not limit the allergens to that room. 2. Don't pet, hug or kiss the dog or cat; if you do, wash your hands with soap and water. 3. High-efficiency particulate air (HEPA) cleaners run continuously in a bedroom or living room can reduce allergen levels over  time. 4. Place electrostatic material sheet in the air inlet vent in the bedroom. 5. Regular use of a high-efficiency vacuum cleaner or a central vacuum can reduce allergen levels. 6. Giving your dog or cat a bath at least once a week can reduce airborne allergen.

## 2020-01-25 NOTE — Assessment & Plan Note (Signed)
   Aeroallergen avoidance measures have been discussed and provided in written form.  Fexofenadine/Allegra (as above).  A prescription has been provided for fluticasone nasal spray, 1 to 2 sprays per nostril daily if needed. Proper nasal spray technique has been discussed and demonstrated.  Nasal saline spray (i.e. Simply Saline) is recommended prior to medicated nasal sprays and as needed.

## 2020-01-25 NOTE — Assessment & Plan Note (Signed)
   Tobacco cessation has been discussed and encouraged. 

## 2020-05-16 DIAGNOSIS — J309 Allergic rhinitis, unspecified: Secondary | ICD-10-CM | POA: Diagnosis not present

## 2020-05-16 DIAGNOSIS — Z13220 Encounter for screening for lipoid disorders: Secondary | ICD-10-CM | POA: Diagnosis not present

## 2020-05-16 DIAGNOSIS — K0510 Chronic gingivitis, plaque induced: Secondary | ICD-10-CM | POA: Diagnosis not present

## 2020-05-16 DIAGNOSIS — F322 Major depressive disorder, single episode, severe without psychotic features: Secondary | ICD-10-CM | POA: Diagnosis not present

## 2020-05-16 DIAGNOSIS — Z0000 Encounter for general adult medical examination without abnormal findings: Secondary | ICD-10-CM | POA: Diagnosis not present

## 2020-05-16 DIAGNOSIS — F419 Anxiety disorder, unspecified: Secondary | ICD-10-CM | POA: Diagnosis not present

## 2020-05-16 DIAGNOSIS — M810 Age-related osteoporosis without current pathological fracture: Secondary | ICD-10-CM | POA: Diagnosis not present

## 2020-05-16 DIAGNOSIS — Z136 Encounter for screening for cardiovascular disorders: Secondary | ICD-10-CM | POA: Diagnosis not present

## 2020-05-16 DIAGNOSIS — Z23 Encounter for immunization: Secondary | ICD-10-CM | POA: Diagnosis not present

## 2020-05-16 DIAGNOSIS — Z1389 Encounter for screening for other disorder: Secondary | ICD-10-CM | POA: Diagnosis not present

## 2020-05-23 DIAGNOSIS — H26492 Other secondary cataract, left eye: Secondary | ICD-10-CM | POA: Diagnosis not present

## 2020-05-23 DIAGNOSIS — H52203 Unspecified astigmatism, bilateral: Secondary | ICD-10-CM | POA: Diagnosis not present

## 2020-05-23 DIAGNOSIS — H353131 Nonexudative age-related macular degeneration, bilateral, early dry stage: Secondary | ICD-10-CM | POA: Diagnosis not present

## 2020-05-24 DIAGNOSIS — H903 Sensorineural hearing loss, bilateral: Secondary | ICD-10-CM | POA: Diagnosis not present

## 2020-06-01 ENCOUNTER — Other Ambulatory Visit: Payer: Self-pay | Admitting: Family Medicine

## 2020-06-01 ENCOUNTER — Ambulatory Visit
Admission: RE | Admit: 2020-06-01 | Discharge: 2020-06-01 | Disposition: A | Discharge status: Home / Self Care | Payer: Medicare PPO | Source: Ambulatory Visit | Attending: Family Medicine | Admitting: Family Medicine

## 2020-06-01 ENCOUNTER — Other Ambulatory Visit: Payer: Self-pay

## 2020-06-01 DIAGNOSIS — Z1231 Encounter for screening mammogram for malignant neoplasm of breast: Secondary | ICD-10-CM

## 2020-06-02 DIAGNOSIS — M62830 Muscle spasm of back: Secondary | ICD-10-CM | POA: Diagnosis not present

## 2020-06-02 DIAGNOSIS — M47818 Spondylosis without myelopathy or radiculopathy, sacral and sacrococcygeal region: Secondary | ICD-10-CM | POA: Diagnosis not present

## 2020-06-02 DIAGNOSIS — M5441 Lumbago with sciatica, right side: Secondary | ICD-10-CM | POA: Diagnosis not present

## 2020-06-02 DIAGNOSIS — M4180 Other forms of scoliosis, site unspecified: Secondary | ICD-10-CM | POA: Diagnosis not present

## 2020-06-02 DIAGNOSIS — W19XXXD Unspecified fall, subsequent encounter: Secondary | ICD-10-CM | POA: Diagnosis not present

## 2020-06-02 DIAGNOSIS — Y92009 Unspecified place in unspecified non-institutional (private) residence as the place of occurrence of the external cause: Secondary | ICD-10-CM | POA: Diagnosis not present

## 2020-06-02 DIAGNOSIS — M5136 Other intervertebral disc degeneration, lumbar region: Secondary | ICD-10-CM | POA: Diagnosis not present

## 2020-10-05 DIAGNOSIS — R109 Unspecified abdominal pain: Secondary | ICD-10-CM | POA: Diagnosis not present

## 2020-10-05 DIAGNOSIS — L299 Pruritus, unspecified: Secondary | ICD-10-CM | POA: Diagnosis not present

## 2020-11-14 DIAGNOSIS — K219 Gastro-esophageal reflux disease without esophagitis: Secondary | ICD-10-CM | POA: Diagnosis not present

## 2020-11-14 DIAGNOSIS — K580 Irritable bowel syndrome with diarrhea: Secondary | ICD-10-CM | POA: Diagnosis not present

## 2020-11-14 DIAGNOSIS — F419 Anxiety disorder, unspecified: Secondary | ICD-10-CM | POA: Diagnosis not present

## 2020-11-14 DIAGNOSIS — R197 Diarrhea, unspecified: Secondary | ICD-10-CM | POA: Diagnosis not present

## 2020-11-14 DIAGNOSIS — F322 Major depressive disorder, single episode, severe without psychotic features: Secondary | ICD-10-CM | POA: Diagnosis not present

## 2020-12-06 DIAGNOSIS — R194 Change in bowel habit: Secondary | ICD-10-CM | POA: Diagnosis not present

## 2020-12-06 DIAGNOSIS — R197 Diarrhea, unspecified: Secondary | ICD-10-CM | POA: Diagnosis not present

## 2020-12-06 DIAGNOSIS — H61002 Unspecified perichondritis of left external ear: Secondary | ICD-10-CM | POA: Insufficient documentation

## 2020-12-21 DIAGNOSIS — K648 Other hemorrhoids: Secondary | ICD-10-CM | POA: Diagnosis not present

## 2020-12-21 DIAGNOSIS — Z9889 Other specified postprocedural states: Secondary | ICD-10-CM | POA: Diagnosis not present

## 2020-12-21 DIAGNOSIS — Z1211 Encounter for screening for malignant neoplasm of colon: Secondary | ICD-10-CM | POA: Diagnosis not present

## 2020-12-21 DIAGNOSIS — K635 Polyp of colon: Secondary | ICD-10-CM | POA: Diagnosis not present

## 2020-12-21 DIAGNOSIS — K641 Second degree hemorrhoids: Secondary | ICD-10-CM | POA: Diagnosis not present

## 2020-12-21 DIAGNOSIS — R197 Diarrhea, unspecified: Secondary | ICD-10-CM | POA: Diagnosis not present

## 2020-12-21 DIAGNOSIS — K52839 Microscopic colitis, unspecified: Secondary | ICD-10-CM | POA: Diagnosis not present

## 2020-12-21 DIAGNOSIS — Z980 Intestinal bypass and anastomosis status: Secondary | ICD-10-CM | POA: Diagnosis not present

## 2021-02-12 DIAGNOSIS — M5441 Lumbago with sciatica, right side: Secondary | ICD-10-CM | POA: Diagnosis not present

## 2021-02-12 DIAGNOSIS — K219 Gastro-esophageal reflux disease without esophagitis: Secondary | ICD-10-CM | POA: Insufficient documentation

## 2021-02-12 DIAGNOSIS — R3911 Hesitancy of micturition: Secondary | ICD-10-CM | POA: Insufficient documentation

## 2021-02-12 DIAGNOSIS — M62830 Muscle spasm of back: Secondary | ICD-10-CM | POA: Diagnosis not present

## 2021-02-12 DIAGNOSIS — K580 Irritable bowel syndrome with diarrhea: Secondary | ICD-10-CM | POA: Insufficient documentation

## 2021-02-12 DIAGNOSIS — K0510 Chronic gingivitis, plaque induced: Secondary | ICD-10-CM | POA: Insufficient documentation

## 2021-02-12 DIAGNOSIS — R2689 Other abnormalities of gait and mobility: Secondary | ICD-10-CM | POA: Insufficient documentation

## 2021-02-12 DIAGNOSIS — M47818 Spondylosis without myelopathy or radiculopathy, sacral and sacrococcygeal region: Secondary | ICD-10-CM | POA: Diagnosis not present

## 2021-02-12 DIAGNOSIS — M5136 Other intervertebral disc degeneration, lumbar region: Secondary | ICD-10-CM | POA: Diagnosis not present

## 2021-02-12 DIAGNOSIS — F329 Major depressive disorder, single episode, unspecified: Secondary | ICD-10-CM | POA: Insufficient documentation

## 2021-02-12 DIAGNOSIS — L209 Atopic dermatitis, unspecified: Secondary | ICD-10-CM | POA: Insufficient documentation

## 2021-02-12 DIAGNOSIS — M25551 Pain in right hip: Secondary | ICD-10-CM | POA: Diagnosis not present

## 2021-02-12 DIAGNOSIS — M1611 Unilateral primary osteoarthritis, right hip: Secondary | ICD-10-CM | POA: Diagnosis not present

## 2021-02-12 DIAGNOSIS — B070 Plantar wart: Secondary | ICD-10-CM | POA: Insufficient documentation

## 2021-03-27 ENCOUNTER — Other Ambulatory Visit: Payer: Self-pay

## 2021-03-27 ENCOUNTER — Ambulatory Visit
Admission: EM | Admit: 2021-03-27 | Discharge: 2021-03-27 | Discharge status: Against Medical Advice | Payer: Medicare PPO

## 2021-04-24 ENCOUNTER — Other Ambulatory Visit: Payer: Self-pay | Admitting: Family Medicine

## 2021-04-24 DIAGNOSIS — Z1231 Encounter for screening mammogram for malignant neoplasm of breast: Secondary | ICD-10-CM

## 2021-06-05 ENCOUNTER — Ambulatory Visit
Admission: RE | Admit: 2021-06-05 | Discharge: 2021-06-05 | Disposition: A | Discharge status: Home / Self Care | Payer: Medicare PPO | Source: Ambulatory Visit | Attending: Family Medicine | Admitting: Family Medicine

## 2021-06-05 ENCOUNTER — Other Ambulatory Visit: Payer: Self-pay

## 2021-06-05 DIAGNOSIS — Z1231 Encounter for screening mammogram for malignant neoplasm of breast: Secondary | ICD-10-CM | POA: Diagnosis not present

## 2021-06-07 ENCOUNTER — Other Ambulatory Visit: Payer: Self-pay | Admitting: Family Medicine

## 2021-06-07 DIAGNOSIS — J309 Allergic rhinitis, unspecified: Secondary | ICD-10-CM | POA: Diagnosis not present

## 2021-06-07 DIAGNOSIS — K219 Gastro-esophageal reflux disease without esophagitis: Secondary | ICD-10-CM | POA: Diagnosis not present

## 2021-06-07 DIAGNOSIS — F419 Anxiety disorder, unspecified: Secondary | ICD-10-CM | POA: Diagnosis not present

## 2021-06-07 DIAGNOSIS — Z1389 Encounter for screening for other disorder: Secondary | ICD-10-CM | POA: Diagnosis not present

## 2021-06-07 DIAGNOSIS — M810 Age-related osteoporosis without current pathological fracture: Secondary | ICD-10-CM

## 2021-06-07 DIAGNOSIS — Z0000 Encounter for general adult medical examination without abnormal findings: Secondary | ICD-10-CM | POA: Diagnosis not present

## 2021-06-07 DIAGNOSIS — K580 Irritable bowel syndrome with diarrhea: Secondary | ICD-10-CM | POA: Diagnosis not present

## 2021-06-07 DIAGNOSIS — F322 Major depressive disorder, single episode, severe without psychotic features: Secondary | ICD-10-CM | POA: Diagnosis not present

## 2021-06-07 DIAGNOSIS — S41101A Unspecified open wound of right upper arm, initial encounter: Secondary | ICD-10-CM | POA: Diagnosis not present

## 2021-09-03 DIAGNOSIS — H353131 Nonexudative age-related macular degeneration, bilateral, early dry stage: Secondary | ICD-10-CM | POA: Diagnosis not present

## 2021-09-03 DIAGNOSIS — H52203 Unspecified astigmatism, bilateral: Secondary | ICD-10-CM | POA: Diagnosis not present

## 2021-09-03 DIAGNOSIS — Z961 Presence of intraocular lens: Secondary | ICD-10-CM | POA: Diagnosis not present

## 2021-09-25 DIAGNOSIS — K219 Gastro-esophageal reflux disease without esophagitis: Secondary | ICD-10-CM | POA: Diagnosis not present

## 2021-11-08 ENCOUNTER — Ambulatory Visit
Admission: RE | Admit: 2021-11-08 | Discharge: 2021-11-08 | Disposition: A | Discharge status: Home / Self Care | Payer: Medicare PPO | Source: Ambulatory Visit | Attending: Family Medicine | Admitting: Family Medicine

## 2021-11-08 DIAGNOSIS — M810 Age-related osteoporosis without current pathological fracture: Secondary | ICD-10-CM

## 2021-11-08 DIAGNOSIS — Z780 Asymptomatic menopausal state: Secondary | ICD-10-CM | POA: Diagnosis not present

## 2022-02-14 DIAGNOSIS — Z822 Family history of deafness and hearing loss: Secondary | ICD-10-CM | POA: Diagnosis not present

## 2022-02-14 DIAGNOSIS — H903 Sensorineural hearing loss, bilateral: Secondary | ICD-10-CM | POA: Diagnosis not present

## 2022-02-22 IMAGING — MG MM DIGITAL SCREENING BILAT W/ TOMO AND CAD
8 series · 9 of 24 positions shown · non-contrast
Comparison: Previous exam(s).

CLINICAL DATA: Screening.

EXAM:
DIGITAL SCREENING BILATERAL MAMMOGRAM WITH TOMOSYNTHESIS AND CAD
TECHNIQUE: Bilateral screening digital craniocaudal and mediolateral oblique
mammograms were obtained. Bilateral screening digital breast
tomosynthesis was performed. The images were evaluated with
computer-aided detection.

[L MLO synth-2D]
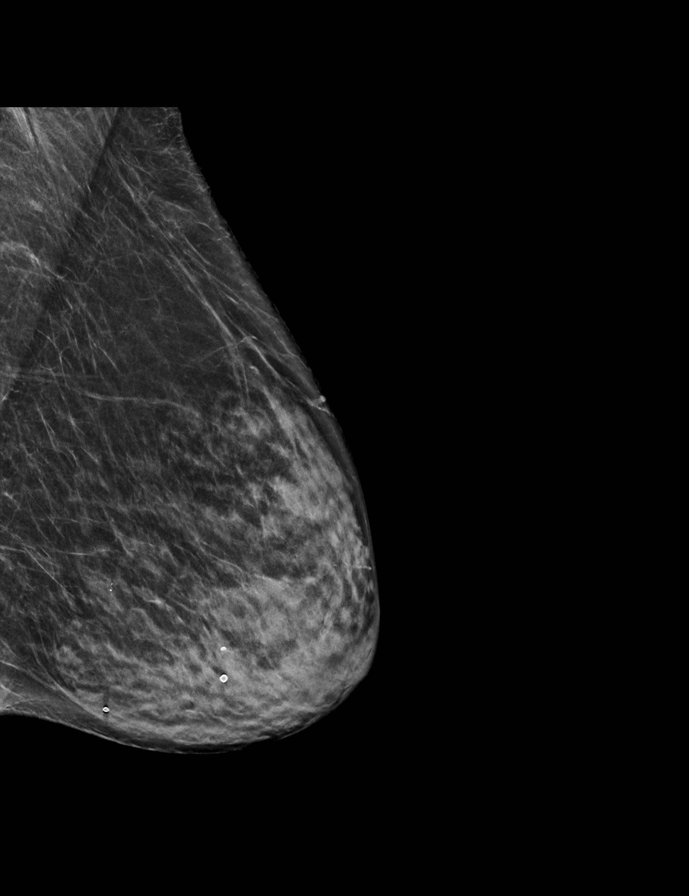

[L CC synth-2D]
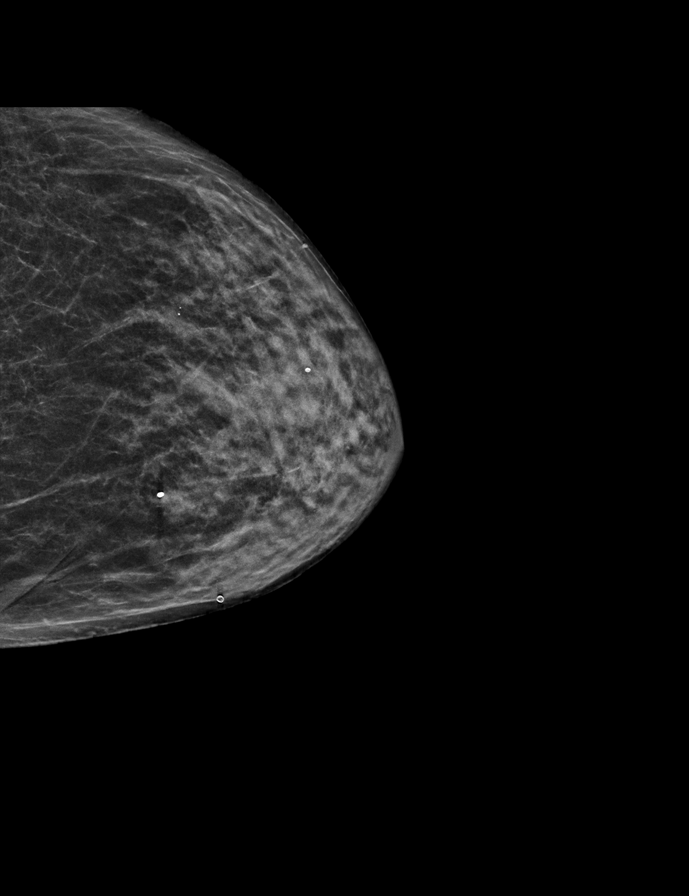

[R MLO synth-2D]
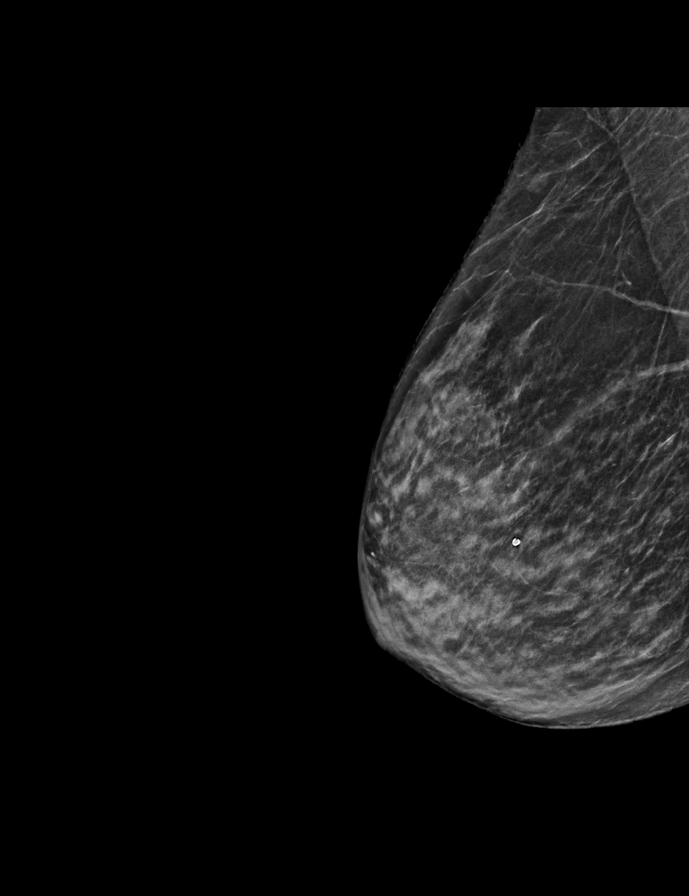

[R CC synth-2D]
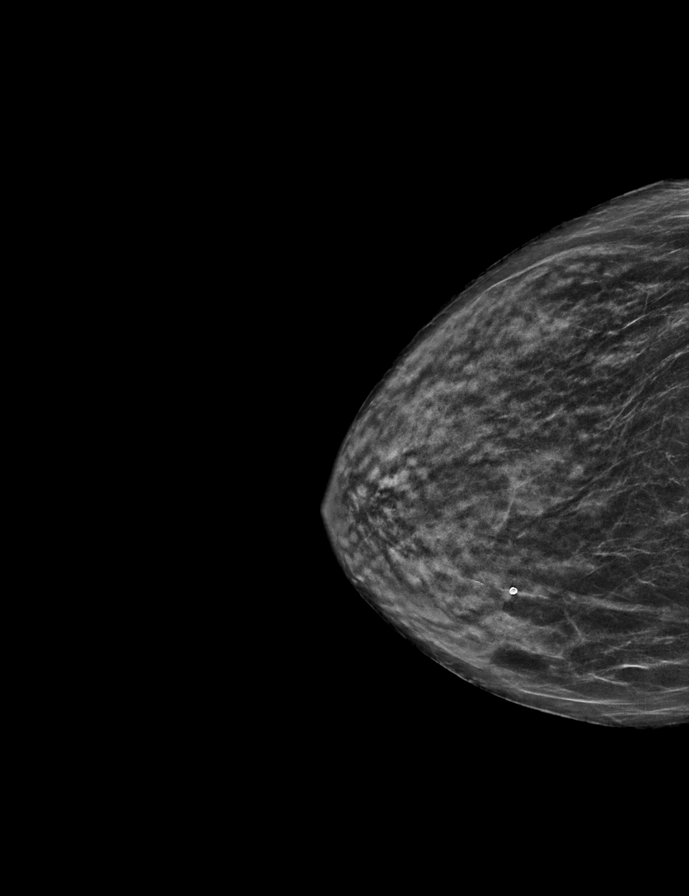

[R CC tomo · 2 of 50 frames shown]
[frame 17/50]
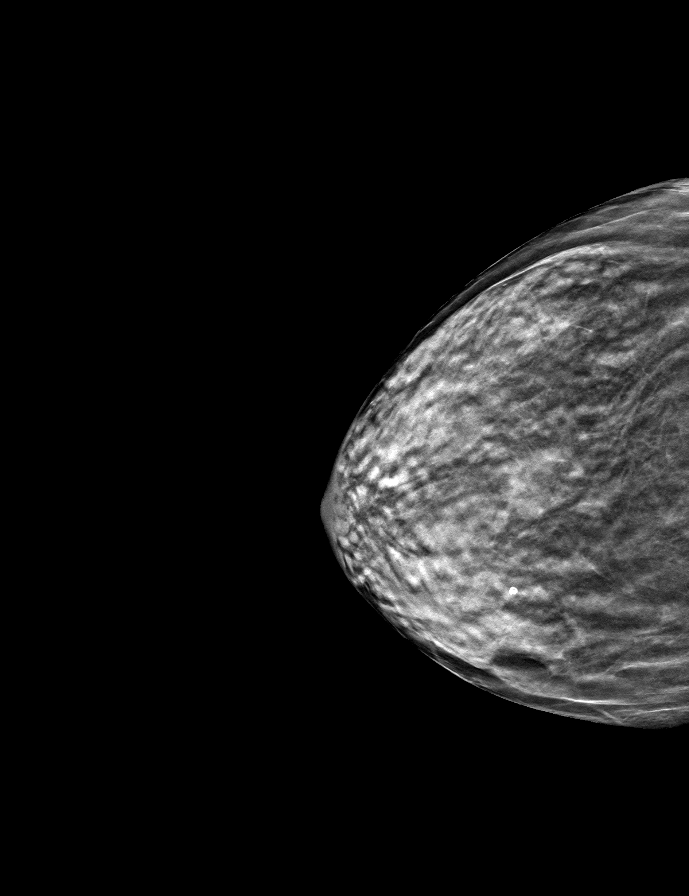
[frame 25/50]
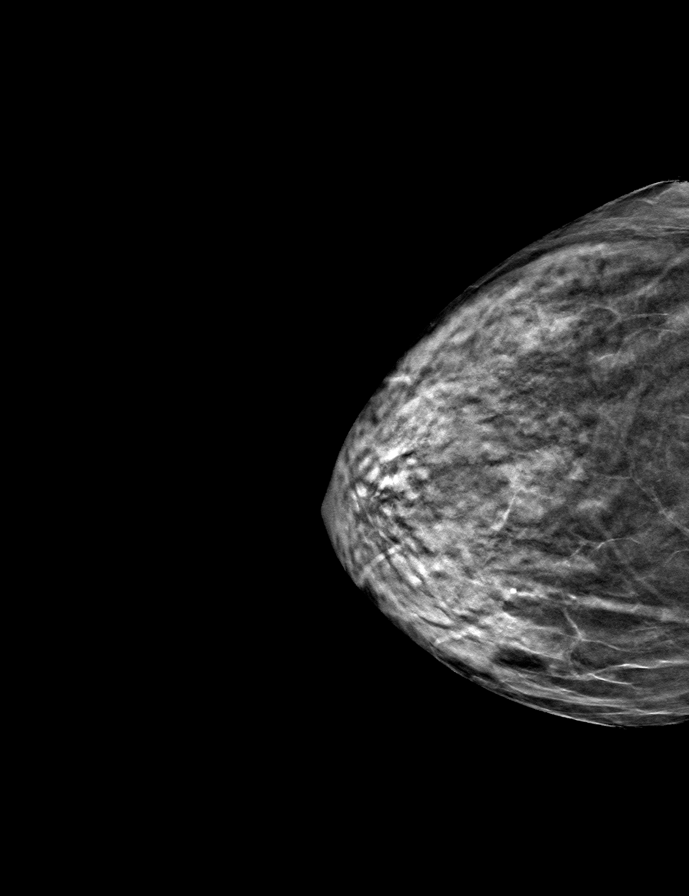

[R MLO tomo · tomo slice 25/49.0]
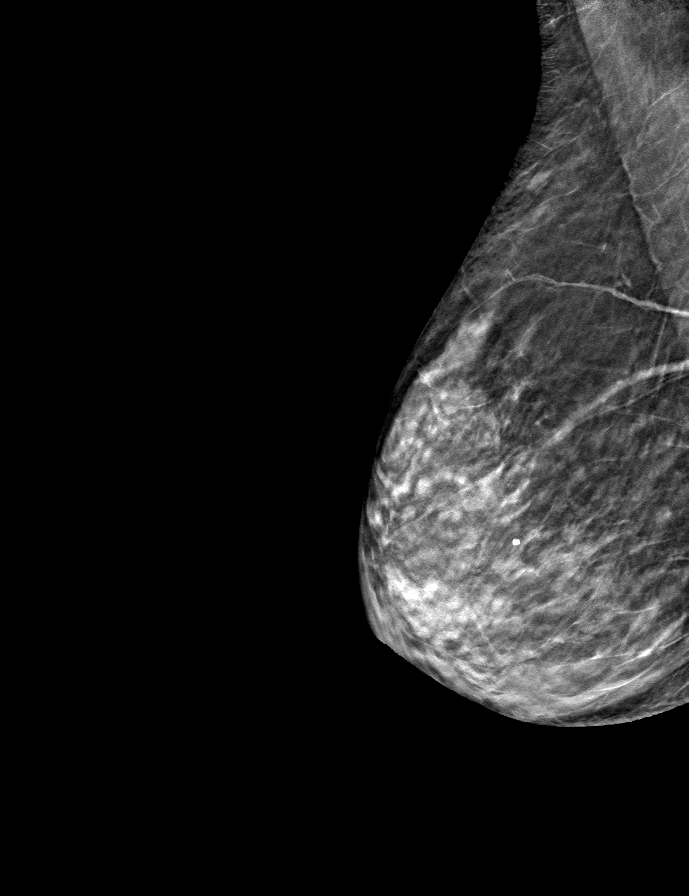

[L MLO tomo · tomo slice 29/57.0]
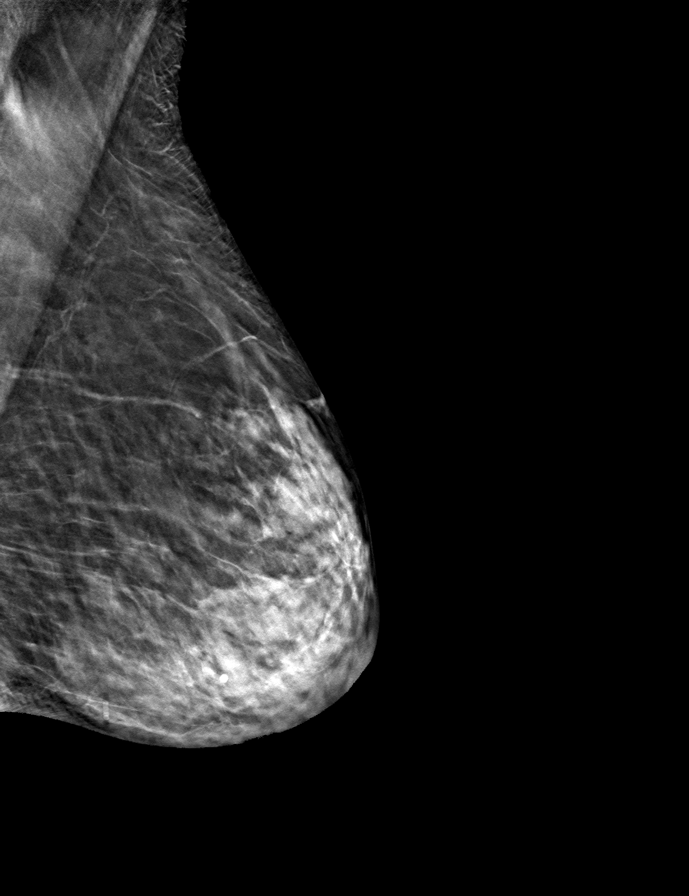

[L CC tomo · tomo slice 26/51.0]
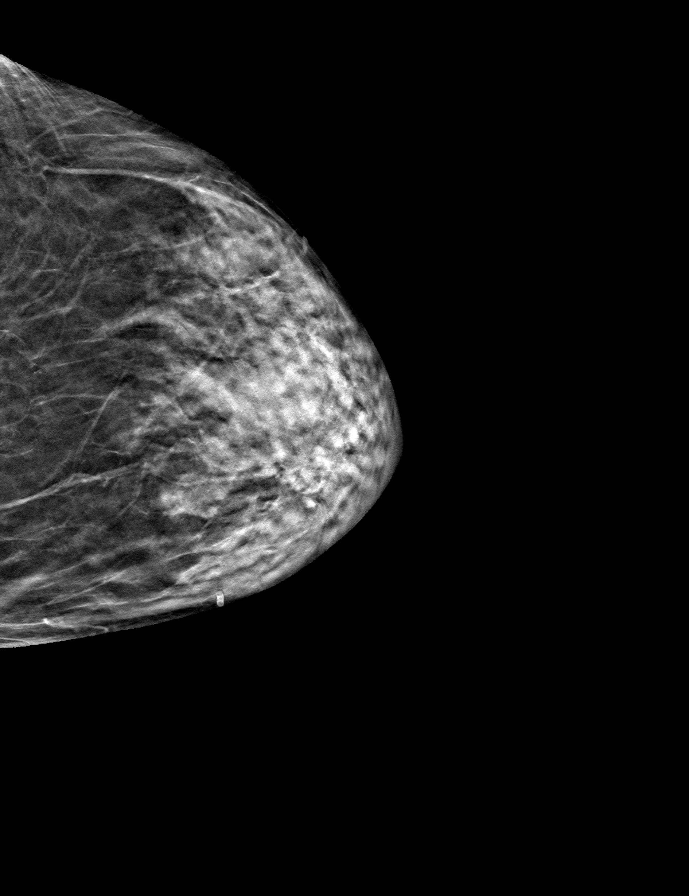

[9 of 24 positions shown; findings below may reference images not displayed]

ACR Breast Density Category c: The breast tissue is heterogeneously
dense, which may obscure small masses.
FINDINGS: There are no findings suspicious for malignancy.
IMPRESSION: No mammographic evidence of malignancy. A result letter of this
screening mammogram will be mailed directly to the patient.

RECOMMENDATION:
Screening mammogram in one year. (Code:Q3-W-BC3)

BI-RADS CATEGORY  1: Negative.

## 2022-04-12 DIAGNOSIS — H903 Sensorineural hearing loss, bilateral: Secondary | ICD-10-CM | POA: Diagnosis not present

## 2022-05-02 DIAGNOSIS — N3281 Overactive bladder: Secondary | ICD-10-CM | POA: Diagnosis not present

## 2022-05-02 DIAGNOSIS — N3941 Urge incontinence: Secondary | ICD-10-CM | POA: Diagnosis not present

## 2022-05-28 DIAGNOSIS — Z720 Tobacco use: Secondary | ICD-10-CM | POA: Diagnosis not present

## 2022-05-28 DIAGNOSIS — F419 Anxiety disorder, unspecified: Secondary | ICD-10-CM | POA: Diagnosis not present

## 2022-05-28 DIAGNOSIS — Z7689 Persons encountering health services in other specified circumstances: Secondary | ICD-10-CM | POA: Diagnosis not present

## 2022-05-28 DIAGNOSIS — R413 Other amnesia: Secondary | ICD-10-CM | POA: Diagnosis not present

## 2022-05-28 DIAGNOSIS — M810 Age-related osteoporosis without current pathological fracture: Secondary | ICD-10-CM | POA: Diagnosis not present

## 2022-05-28 DIAGNOSIS — K219 Gastro-esophageal reflux disease without esophagitis: Secondary | ICD-10-CM | POA: Diagnosis not present

## 2022-05-28 DIAGNOSIS — R2689 Other abnormalities of gait and mobility: Secondary | ICD-10-CM | POA: Diagnosis not present

## 2022-05-28 DIAGNOSIS — Z23 Encounter for immunization: Secondary | ICD-10-CM | POA: Diagnosis not present

## 2022-06-28 DIAGNOSIS — Z961 Presence of intraocular lens: Secondary | ICD-10-CM | POA: Diagnosis not present

## 2022-06-28 DIAGNOSIS — H524 Presbyopia: Secondary | ICD-10-CM | POA: Diagnosis not present

## 2022-06-28 DIAGNOSIS — H353132 Nonexudative age-related macular degeneration, bilateral, intermediate dry stage: Secondary | ICD-10-CM | POA: Diagnosis not present

## 2022-06-28 DIAGNOSIS — H35363 Drusen (degenerative) of macula, bilateral: Secondary | ICD-10-CM | POA: Diagnosis not present

## 2022-06-28 DIAGNOSIS — H3554 Dystrophies primarily involving the retinal pigment epithelium: Secondary | ICD-10-CM | POA: Diagnosis not present

## 2022-06-28 DIAGNOSIS — H26492 Other secondary cataract, left eye: Secondary | ICD-10-CM | POA: Diagnosis not present

## 2022-06-28 DIAGNOSIS — H04123 Dry eye syndrome of bilateral lacrimal glands: Secondary | ICD-10-CM | POA: Diagnosis not present

## 2022-06-28 DIAGNOSIS — H43813 Vitreous degeneration, bilateral: Secondary | ICD-10-CM | POA: Diagnosis not present

## 2022-07-04 DIAGNOSIS — H26492 Other secondary cataract, left eye: Secondary | ICD-10-CM | POA: Diagnosis not present

## 2022-12-30 DIAGNOSIS — R2681 Unsteadiness on feet: Secondary | ICD-10-CM | POA: Insufficient documentation

## 2022-12-30 DIAGNOSIS — R413 Other amnesia: Secondary | ICD-10-CM | POA: Insufficient documentation

## 2023-02-04 DIAGNOSIS — F01B0 Vascular dementia, moderate, without behavioral disturbance, psychotic disturbance, mood disturbance, and anxiety: Secondary | ICD-10-CM | POA: Insufficient documentation

## 2023-02-04 DIAGNOSIS — I679 Cerebrovascular disease, unspecified: Secondary | ICD-10-CM | POA: Insufficient documentation

## 2023-06-13 ENCOUNTER — Telehealth: Payer: Self-pay

## 2023-06-13 ENCOUNTER — Ambulatory Visit
Admission: EM | Admit: 2023-06-13 | Discharge: 2023-06-13 | Disposition: A | Discharge status: Home / Self Care | Payer: Medicare PPO | Attending: Family Medicine | Admitting: Family Medicine

## 2023-06-13 DIAGNOSIS — H5712 Ocular pain, left eye: Secondary | ICD-10-CM | POA: Diagnosis not present

## 2023-06-13 NOTE — ED Notes (Signed)
Patient is being discharged from the Urgent Care and sent to the Emergency Department via Private Vehicle (Spouse) . Per Dr. Haynes Bast MD, patient is in need of higher level of care due to Eye Pain post injection. Patient is aware and verbalizes understanding of plan of care.  Vitals:   06/13/23 1346  BP: 108/63  Pulse: 74  Resp: 18  Temp: 98.5 F (36.9 C)  SpO2: 95%

## 2023-06-13 NOTE — ED Triage Notes (Signed)
Here with Husband. "She had a left eye injection yesterday, since this visit she has had left eye pain". "Some visual changes and inability to open it following injection/pain". "I have not reached out to the office yet who did the injection".

## 2023-06-13 NOTE — Telephone Encounter (Signed)
Contacted Spouse Fayrene Fearing) letting him know that the on call provider Olegario Messier Tsamis) phoned back to Urgent Care and advised ED-[facility] in [facility] for evaluation.  B. Roten CMA

## 2023-06-13 NOTE — ED Provider Notes (Signed)
[name]    CSN: [id number] Arrival date & time: 06/13/23  1329      History   Chief Complaint Chief Complaint  Patient presents with   Eye Pain    HPI [name] is a 82 y.o. female.   Patient is here for left eye pain.  She had an injection into the left eye yesterday with a retina specialist.  She states she started with minimal pain on the way home, which happens.  But today the pain is much worse.  She is unable to keep the eye open due to pain.  They state this has happened before, but not as bad.  They were given eye drops and an anti-bacterial eye ointment.  This did not help much, but it eventually went away.   Eye Pain       History reviewed. No pertinent past medical history.  Patient Active Problem List   Diagnosis Date Noted   Allergic urticaria 01/25/2020   Perennial allergic rhinitis 01/25/2020   Tobacco use 05/06/2017   COPD (chronic obstructive pulmonary disease) (HCC) 05/06/2017    Past Surgical History:  Procedure Laterality Date   CHOLECYSTECTOMY  10/24/2010   COLECTOMY  2006   BENIGN TUMOR    OB History   No obstetric history on file.      Home Medications    Prior to Admission medications   Medication Sig Start Date End Date Taking? Authorizing Provider  erythromycin ophthalmic ointment Place 1 Application into the left eye 4 (four) times daily. 04/11/23  Yes [provider]  ALPRAZolam (XANAX) 0.25 MG tablet Take 0.25 mg by mouth as needed. 09/22/14   [provider]  aspirin EC 81 MG tablet Take 81 mg by mouth daily.    [provider]  Calcium-Vitamin D-Vitamin K 650-12.5-40 MG-MCG-MCG CHEW Chew by mouth.    [provider]  Cholecalciferol (VITAMIN D3 PO) Take by mouth daily.    [provider]  Cobalamin Combinations (VITAMIN B12-FOLIC ACID) 500-400 MCG TABS Take 1 tablet by mouth daily.    [provider]  donepezil (ARICEPT) 10 MG tablet Take 1 tablet by  mouth daily. 04/07/23 04/06/24  [provider]  donepezil (ARICEPT) 5 MG tablet Take 5 mg by mouth daily. 03/31/23   [provider]  escitalopram (LEXAPRO) 20 MG tablet Take 20 mg by mouth daily.      [provider]  fexofenadine (ALLEGRA) 60 MG tablet 1-2 times daily if needed 01/25/20   Bobbitt, Heywood Iles, MD  fluticasone [facility]) 50 MCG/ACT nasal spray 1-2 sprays per nostril daily if needed 01/25/20   Bobbitt, Heywood Iles, MD  Lactobacillus [facility] WOMEN) CAPS Take 1 capsule by mouth daily at 6 (six) AM. 06/02/20   [provider]  Multiple Vitamin (MULTIVITAMIN) capsule Take 1 capsule by mouth daily. VIT. B,D,E     [provider]  Probiotic Product (PROBIOTIC PO) Take by mouth daily.    [provider]  pyridoxine (B-6) 250 MG tablet Take 250 mg by mouth daily.    [provider]  raloxifene (EVISTA) 60 MG tablet Take 60 mg by mouth daily.    [provider]  vitamin B-12 (CYANOCOBALAMIN) 500 MCG tablet Take 500 mcg by mouth daily.    [provider]  vitamin E 400 UNIT capsule Take 400 Units by mouth daily.    [provider]    Family History Family History  Problem Relation Age of Onset  Cancer Brother    Diabetes Brother    Heart disease Brother    Cancer Sister    Diabetes Sister    Breast cancer Neg Hx     Social History Social History   Tobacco Use   Smoking status: Every Day    Current packs/day: 1.00    Average packs/day: 1 pack/day for 50.0 years (50.0 ttl pk-yrs)    Types: Cigarettes   Smokeless tobacco: Never  Vaping Use   Vaping status: Never Used  Substance Use Topics   Alcohol use: No   Drug use: No     Allergies   Sulfamethoxazole-trimethoprim and Sulfa antibiotics   Review of Systems Review of Systems  Constitutional: Negative.   HENT: Negative.    Eyes:  Positive for pain, discharge and redness.  Cardiovascular: Negative.   Gastrointestinal: Negative.    Musculoskeletal: Negative.   Psychiatric/Behavioral: Negative.       Physical Exam Triage Vital Signs ED Triage Vitals  Encounter Vitals Group     BP 06/13/23 1346 108/63     Systolic BP Percentile --      Diastolic BP Percentile --      Pulse Rate 06/13/23 1346 74     Resp 06/13/23 1346 18     Temp 06/13/23 1346 98.5 F (36.9 C)     Temp Source 06/13/23 1346 Oral     SpO2 06/13/23 1346 95 %     Weight 06/13/23 1344 94 lb 12.8 oz (43 kg)     Height 06/13/23 1344 4\' 11"  (1.499 m)     Head Circumference --      Peak Flow --      Pain Score 06/13/23 1338 10     Pain Loc --      Pain Education --      Exclude from Growth Chart --    No data found.  Updated Vital Signs BP 108/63 (BP Location: Left Arm)   Pulse 74   Temp 98.5 F (36.9 C) (Oral)   Resp 18   Ht 4\' 11"  (1.499 m)   Wt 43 kg   SpO2 95%   BMI 19.15 kg/m   Visual Acuity Right Eye Distance:  (UTO today due to pain in eye) Left Eye Distance:   Bilateral Distance:  (UTO today due to pain in eye)  Right Eye Near:   Left Eye Near:  L Near:  (UTO today due to pain in eye) Bilateral Near:     Physical Exam Constitutional:      General: She is in acute distress.     Appearance: Normal appearance.  HENT:     Head:     Comments: Difficult exam;  patient keeps the eye closed for comfort.  She has redness/injection to the sclera;  she has tearing from the eye as well;   Neurological:     Mental Status: She is alert.      UC Treatments / Results  Labs (all labs ordered are listed, but only abnormal results are displayed) Labs Reviewed - No data to display  EKG   Radiology No results found.  Procedures Procedures (including critical care time)  Medications Ordered in UC Medications - No data to display  Initial Impression / Assessment and Plan / UC Course  I have reviewed the triage vital signs and the nursing notes.  Pertinent labs & imaging results that were available during my care of the  patient were reviewed by me and considered in my medical decision  making (see chart for details).   Final Clinical Impressions(s) / UC Diagnoses   Final diagnoses:  Pain of left eye     Discharge Instructions      You were seen today for eye pain after a procedure. Unfortunately I am unable to do a thorough exam of your eye.  Since your eye specialist is closed, I recommend you go to the ER for further evaluation of your eye pain.     ED Prescriptions   None    PDMP not reviewed this encounter.   Jannifer Franklin, MD 06/13/23 [phone]

## 2023-06-13 NOTE — Telephone Encounter (Signed)
Contacted [facility] [address] [place], [place] [address]  @ [phone] on behalf of patient and their request.  Dorien Chihuahua (On call/paging representative). She will have Dr. Olegario Messier Tsamis paged. Will await return call.  B. Roten CMA

## 2023-06-13 NOTE — Discharge Instructions (Signed)
You were seen today for eye pain after a procedure. Unfortunately I am unable to do a thorough exam of your eye.  Since your eye specialist is closed, I recommend you go to the ER for further evaluation of your eye pain.

## 2023-06-14 ENCOUNTER — Emergency Department (HOSPITAL_COMMUNITY): Payer: Medicare PPO

## 2023-06-14 ENCOUNTER — Emergency Department (HOSPITAL_COMMUNITY)
Admission: EM | Admit: 2023-06-14 | Discharge: 2023-06-14 | Disposition: A | Discharge status: Home / Self Care | Payer: Medicare PPO | Attending: Emergency Medicine | Admitting: Emergency Medicine

## 2023-06-14 ENCOUNTER — Other Ambulatory Visit: Payer: Self-pay

## 2023-06-14 ENCOUNTER — Encounter (HOSPITAL_COMMUNITY): Payer: Self-pay

## 2023-06-14 DIAGNOSIS — L03213 Periorbital cellulitis: Secondary | ICD-10-CM | POA: Diagnosis not present

## 2023-06-14 DIAGNOSIS — Z7982 Long term (current) use of aspirin: Secondary | ICD-10-CM | POA: Diagnosis not present

## 2023-06-14 DIAGNOSIS — J449 Chronic obstructive pulmonary disease, unspecified: Secondary | ICD-10-CM | POA: Insufficient documentation

## 2023-06-14 DIAGNOSIS — H5712 Ocular pain, left eye: Secondary | ICD-10-CM | POA: Diagnosis present

## 2023-06-14 LAB — BASIC METABOLIC PANEL
Anion gap: 8 (ref 5–15)
BUN: 14 mg/dL (ref 8–23)
CO2: 27 mmol/L (ref 22–32)
Calcium: 9.1 mg/dL (ref 8.9–10.3)
Chloride: 100 mmol/L (ref 98–111)
Creatinine, Ser: 0.73 mg/dL (ref 0.44–1.00)
GFR, Estimated: >60 mL/min (ref >60)
Glucose, Bld: 95 mg/dL (ref 70–99)
Potassium: 3.5 mmol/L (ref 3.5–5.1)
Sodium: 135 mmol/L (ref 135–145)

## 2023-06-14 LAB — CBC WITH DIFFERENTIAL/PLATELET
Abs Immature Granulocytes: 0.03 10*3/uL (ref 0.00–0.07)
Basophils Absolute: 0.1 10*3/uL (ref 0.0–0.1)
Basophils Relative: 1 %
Eosinophils Absolute: 0.1 10*3/uL (ref 0.0–0.5)
Eosinophils Relative: 1 %
HCT: 41.5 % (ref 36.0–46.0)
Hemoglobin: 13.6 g/dL (ref 12.0–15.0)
Immature Granulocytes: 0 %
Lymphocytes Relative: 23 %
Lymphs Abs: 2.3 10*3/uL (ref 0.7–4.0)
MCH: 32.6 pg (ref 26.0–34.0)
MCHC: 32.8 g/dL (ref 30.0–36.0)
MCV: 99.5 fL (ref 80.0–100.0)
Monocytes Absolute: 0.7 10*3/uL (ref 0.1–1.0)
Monocytes Relative: 7 %
Neutro Abs: 6.9 10*3/uL (ref 1.7–7.7)
Neutrophils Relative %: 68 %
Platelets: 259 10*3/uL (ref 150–400)
RBC: 4.17 MIL/uL (ref 3.87–5.11)
RDW: 12.9 % (ref 11.5–15.5)
WBC: 10.1 10*3/uL (ref 4.0–10.5)
nRBC: 0 % (ref 0.0–0.2)

## 2023-06-14 MED ORDER — FLUORESCEIN SODIUM 1 MG OP STRP
1.0000 | ORAL_STRIP | Freq: Once | OPHTHALMIC | Status: AC
Start: 2023-06-14 — End: 2023-06-14
  Administered 2023-06-14: 1 via OPHTHALMIC
  Filled 2023-06-14: qty 1

## 2023-06-14 MED ORDER — IOHEXOL 300 MG/ML  SOLN
75.0000 mL | Freq: Once | INTRAMUSCULAR | Status: AC | PRN
Start: 2023-06-14 — End: 2023-06-14
  Administered 2023-06-14: 75 mL via INTRAVENOUS

## 2023-06-14 MED ORDER — AMOXICILLIN-POT CLAVULANATE 875-125 MG PO TABS: 1.0000 | ORAL_TABLET | Freq: Two times a day (BID) | ORAL | 0 refills | Status: DC

## 2023-06-14 MED ORDER — TETRACAINE HCL 0.5 % OP SOLN
1.0000 [drp] | Freq: Once | OPHTHALMIC | Status: AC
Start: 2023-06-14 — End: 2023-06-14
  Administered 2023-06-14: 1 [drp] via OPHTHALMIC
  Filled 2023-06-14: qty 4

## 2023-06-14 MED ORDER — CLINDAMYCIN HCL 150 MG PO CAPS: 300.0000 mg | ORAL_CAPSULE | Freq: Three times a day (TID) | ORAL | 0 refills | Status: DC

## 2023-06-14 MED ORDER — ACETAMINOPHEN 325 MG PO TABS
650.0000 mg | ORAL_TABLET | Freq: Once | ORAL | Status: AC
Start: 2023-06-14 — End: 2023-06-14
  Administered 2023-06-14: 650 mg via ORAL
  Filled 2023-06-14: qty 2

## 2023-06-14 NOTE — ED Triage Notes (Signed)
Patient reports she had a left eye injection 2 days ago. Since she has had swelling and increased pain.

## 2023-06-14 NOTE — ED Provider Notes (Signed)
[place] EMERGENCY DEPARTMENT AT [facility] Provider Note   CSN: [id number] Arrival date & time: 06/14/23  1044     History  Chief Complaint  Patient presents with   Eye Problem    [name] is a 82 y.o. female with past medical history of COPD, tobacco use, macular degeneration presenting to emergency room with 2 days of left eye pain after having left eye injection 2 days ago.  Patient reports she has had swelling of left eye and increased pain.  She is also noticed some discharge from eye, appears clear.  Patient reports she has had injection in the past resulted in about 1/2-day of pain but she is not expected her symptoms to last this long.  Denies any obvious change in vision.  Patient has tried erythromycin ointment for 1 day which has not improved her symptoms.  Denies any fevers, chills, headache, shortness of breath, itchy eyes.    Eye Problem      Home Medications Prior to Admission medications   Medication Sig Start Date End Date Taking? Authorizing Provider  ALPRAZolam (XANAX) 0.25 MG tablet Take 0.25 mg by mouth as needed. 09/22/14   [provider]  aspirin EC 81 MG tablet Take 81 mg by mouth daily.    [provider]  Calcium-Vitamin D-Vitamin K 650-12.5-40 MG-MCG-MCG CHEW Chew by mouth.    [provider]  Cholecalciferol (VITAMIN D3 PO) Take by mouth daily.    [provider]  Cobalamin Combinations (VITAMIN B12-FOLIC ACID) 500-400 MCG TABS Take 1 tablet by mouth daily.    [provider]  donepezil (ARICEPT) 10 MG tablet Take 1 tablet by mouth daily. 04/07/23 04/06/24  [provider]  donepezil (ARICEPT) 5 MG tablet Take 5 mg by mouth daily. 03/31/23   [provider]  erythromycin ophthalmic ointment Place 1 Application into the left eye 4 (four) times daily. 04/11/23   [provider]  escitalopram (LEXAPRO) 20 MG tablet Take 20 mg by mouth daily.      [provider]   fexofenadine (ALLEGRA) 60 MG tablet 1-2 times daily if needed 01/25/20   Bobbitt, Heywood Iles, MD  fluticasone [facility]) 50 MCG/ACT nasal spray 1-2 sprays per nostril daily if needed 01/25/20   Bobbitt, Heywood Iles, MD  Lactobacillus [facility] WOMEN) CAPS Take 1 capsule by mouth daily at 6 (six) AM. 06/02/20   [provider]  Multiple Vitamin (MULTIVITAMIN) capsule Take 1 capsule by mouth daily. VIT. B,D,E     [provider]  Probiotic Product (PROBIOTIC PO) Take by mouth daily.    [provider]  pyridoxine (B-6) 250 MG tablet Take 250 mg by mouth daily.    [provider]  raloxifene (EVISTA) 60 MG tablet Take 60 mg by mouth daily.    [provider]  vitamin B-12 (CYANOCOBALAMIN) 500 MCG tablet Take 500 mcg by mouth daily.    [provider]  vitamin E 400 UNIT capsule Take 400 Units by mouth daily.    [provider]      Allergies    Sulfamethoxazole-trimethoprim and Sulfa antibiotics    Review of Systems   Review of Systems  Eyes:  Positive for pain.    Physical Exam Updated Vital Signs BP 125/63 (BP Location: Left Arm)   Pulse 83   Temp 98.5 F (36.9 C) (Oral)   Resp 16   Ht 4\' 11"  (1.499 m)   Wt 43 kg   SpO2 95%  BMI 19.15 kg/m  Physical Exam Vitals and nursing note reviewed.  Constitutional:      General: She is not in acute distress.    Appearance: She is not toxic-appearing.  HENT:     Head: Normocephalic and atraumatic.     Ears:     Comments: Patient has left-sided erythema surrounding eye with mild edema.  I am suspicious for preorbital cellulitis however with recent procedure obtaining imaging to rule out deeper infection.  Eye exam today shows normal intraocular pressure, IOP of left eye average 18.  No obvious increase uptake on fluorescein stain. Eyes:     General: No scleral icterus.       Left eye: Discharge present.    Extraocular Movements: Extraocular movements intact.      Conjunctiva/sclera:     Left eye: Left conjunctiva is injected. No exudate.    Pupils: Pupils are equal, round, and reactive to light.     Comments: Patient has significant sensitivity to light.  Patient does not have fixed or abnormal appearing pupil.  Cardiovascular:     Rate and Rhythm: Normal rate and regular rhythm.     Pulses: Normal pulses.     Heart sounds: Normal heart sounds.  Pulmonary:     Effort: Pulmonary effort is normal. No respiratory distress.     Breath sounds: Normal breath sounds.  Abdominal:     General: Abdomen is flat. Bowel sounds are normal.     Palpations: Abdomen is soft.     Tenderness: There is no abdominal tenderness.  Skin:    General: Skin is warm and dry.     Findings: No lesion.  Neurological:     General: No focal deficit present.     Mental Status: She is alert and oriented to person, place, and time. Mental status is at baseline.     ED Results / Procedures / Treatments   Labs (all labs ordered are listed, but only abnormal results are displayed) Labs Reviewed  CBC WITH DIFFERENTIAL/PLATELET  BASIC METABOLIC PANEL    EKG None  Radiology CT Orbits W Contrast  Result Date: 06/14/2023 CLINICAL DATA:  Periorbital cellulitis with worsened pain and swelling EXAM: CT ORBITS WITH CONTRAST TECHNIQUE: Multidetector CT images was performed according to the standard protocol following intravenous contrast administration. RADIATION DOSE REDUCTION: This exam was performed according to the departmental dose-optimization program which includes automated exposure control, adjustment of the mA and/or kV according to patient size and/or use of iterative reconstruction technique. CONTRAST:  75mL OMNIPAQUE IOHEXOL 300 MG/ML  SOLN COMPARISON:  MRI 01/07/2023 FINDINGS: Orbits: Right orbit is normal. There is some periorbital preseptal soft tissue swelling on the left consistent with superficial cellulitis. No postseptal orbital pathology is visible on the left.  Visible paranasal sinuses: Clear Soft tissues: Otherwise the soft tissues of the region are normal. Osseous: No significant osseous finding. Limited intracranial: Normal IMPRESSION: Left periorbital preseptal soft tissue swelling consistent with superficial cellulitis. No postseptal orbital pathology is visible. Electronically Signed   By: Paulina Fusi M.D.   On: 06/14/2023 15:02    Procedures Procedures    Medications Ordered in ED Medications - No data to display  ED Course/ Medical Decision Making/ A&P                                 Medical Decision Making Amount and/or Complexity of Data Reviewed Labs: ordered. Radiology: ordered.  Risk OTC drugs.  Prescription drug management.   This patient presents to the ED for concern of left eye pain, this involves an extensive number of treatment options, and is a complaint that carries with it a high risk of complications and morbidity.  The differential diagnosis includes retinitis, uveitis, preorbital cellulitis, orbital cellulitis, abscess, corneal abrasion, glaucoma   Co morbidities that complicate the patient evaluation  Macular degeneration   Additional history obtained:  none   Lab Tests:  I personally interpreted labs.  The pertinent results include: CBC without leukocytosis.  Patient has no anemia.  Electrolytes within normal limits.   Imaging Studies ordered:  I ordered imaging studies including Left periorbital preseptal soft tissue swelling consistent with superficial cellulitis. No postseptal orbital pathology is visible.   I agree with the radiologist interpretation   Cardiac Monitoring: / EKG:  The patient was maintained on a cardiac monitor.    Consultations Obtained:  None    Problem List / ED Course / Critical interventions / Medication management  Patient reporting to emergency room after having eye injection 2 days ago, patient reports she has had gradual increase in pain and swelling  surrounding soft tissue of the eye.  Patient has noticed that it is difficult to keep her eye open secondary to pain.  On eye exam she has no increased uptake on fluorescein dye.  Patient does not have any obvious purulent discharge.  Patient has no increased intraocular pressure.  CT of the eye is consistent with soft tissue cellulitis.  Physical exam is consistent with cellulitis.  Patient requesting discharge on several occasions.  Reports she wants to go home and follow-up with her eye doctor.  Given reassuring physical exam as well as imaging showing superficial cellulitis I will discharge patient home with antibiotics, given strict return precautions and encouraged to follow-up with her eye doctor immediately on Monday.  Patient agrees to return if she has any new or worsening symptoms.   I ordered medication including tylenol  Reevaluation of the patient after these medicines showed that the patient improved I have reviewed the patients home medicines and have made adjustments as needed   Plan  F/u w/ PCP in 2-3d to ensure resolution of sx.  Patient was given return precautions. Patient stable for discharge at this time.  Patient educated on sx/dx and verbalized understanding of plan. Return to ER w/ new or worsening sx.          Final Clinical Impression(s) / ED Diagnoses Final diagnoses:  Periorbital cellulitis of left eye    Rx / DC Orders ED Discharge Orders     None         Smitty Knudsen, PA-C 06/14/23 2216    Elayne Snare K, DO 06/15/23 [phone]

## 2023-06-14 NOTE — Discharge Instructions (Addendum)
You were seen in the emergency room today.  CT scan shows: Left periorbital preseptal soft tissue swelling consistent with superficial cellulitis. No postseptal orbital pathology is visible.  I have sent antibiotics to your pharmacy please take as prescribed.  Please follow-up with your eye specialist as soon as possible.  Return to the emergency room with any new or worsening symptoms.  I would recommend alternating Tylenol and ibuprofen for pain control however if your symptoms change in or not controlled by these medications please return to the emergency room to contact our specialist in the meantime.

## 2023-06-17 ENCOUNTER — Encounter (HOSPITAL_COMMUNITY): Payer: Self-pay

## 2023-06-17 ENCOUNTER — Emergency Department (HOSPITAL_COMMUNITY)
Admission: EM | Admit: 2023-06-17 | Discharge: 2023-06-17 | Discharge status: Against Medical Advice | Payer: Medicare PPO | Attending: Emergency Medicine | Admitting: Emergency Medicine

## 2023-06-17 ENCOUNTER — Other Ambulatory Visit: Payer: Self-pay

## 2023-06-17 DIAGNOSIS — R32 Unspecified urinary incontinence: Secondary | ICD-10-CM | POA: Insufficient documentation

## 2023-06-17 DIAGNOSIS — R159 Full incontinence of feces: Secondary | ICD-10-CM | POA: Diagnosis not present

## 2023-06-17 DIAGNOSIS — F0390 Unspecified dementia without behavioral disturbance: Secondary | ICD-10-CM | POA: Insufficient documentation

## 2023-06-17 DIAGNOSIS — W07XXXA Fall from chair, initial encounter: Secondary | ICD-10-CM | POA: Diagnosis not present

## 2023-06-17 DIAGNOSIS — R1084 Generalized abdominal pain: Secondary | ICD-10-CM | POA: Diagnosis not present

## 2023-06-17 DIAGNOSIS — M545 Low back pain, unspecified: Secondary | ICD-10-CM | POA: Diagnosis not present

## 2023-06-17 DIAGNOSIS — Z7982 Long term (current) use of aspirin: Secondary | ICD-10-CM | POA: Insufficient documentation

## 2023-06-17 DIAGNOSIS — M549 Dorsalgia, unspecified: Secondary | ICD-10-CM | POA: Diagnosis present

## 2023-06-17 LAB — CBC WITH DIFFERENTIAL/PLATELET
Abs Immature Granulocytes: 0.04 10*3/uL (ref 0.00–0.07)
Basophils Absolute: 0.1 10*3/uL (ref 0.0–0.1)
Basophils Relative: 1 %
Eosinophils Absolute: 0.2 10*3/uL (ref 0.0–0.5)
Eosinophils Relative: 3 %
HCT: 34.7 % — ABNORMAL LOW (ref 36.0–46.0)
Hemoglobin: 11.8 g/dL — ABNORMAL LOW (ref 12.0–15.0)
Immature Granulocytes: 1 %
Lymphocytes Relative: 15 %
Lymphs Abs: 1.3 10*3/uL (ref 0.7–4.0)
MCH: 33.4 pg (ref 26.0–34.0)
MCHC: 34 g/dL (ref 30.0–36.0)
MCV: 98.3 fL (ref 80.0–100.0)
Monocytes Absolute: 0.7 10*3/uL (ref 0.1–1.0)
Monocytes Relative: 8 %
Neutro Abs: 6.3 10*3/uL (ref 1.7–7.7)
Neutrophils Relative %: 72 %
Platelets: 224 10*3/uL (ref 150–400)
RBC: 3.53 MIL/uL — ABNORMAL LOW (ref 3.87–5.11)
RDW: 13 % (ref 11.5–15.5)
WBC: 8.7 10*3/uL (ref 4.0–10.5)
nRBC: 0 % (ref 0.0–0.2)

## 2023-06-17 LAB — COMPREHENSIVE METABOLIC PANEL
ALT: 15 U/L (ref 0–44)
AST: 20 U/L (ref 15–41)
Albumin: 3.8 g/dL (ref 3.5–5.0)
Alkaline Phosphatase: 45 U/L (ref 38–126)
Anion gap: 6 (ref 5–15)
BUN: 15 mg/dL (ref 8–23)
CO2: 27 mmol/L (ref 22–32)
Calcium: 9 mg/dL (ref 8.9–10.3)
Chloride: 102 mmol/L (ref 98–111)
Creatinine, Ser: 0.79 mg/dL (ref 0.44–1.00)
GFR, Estimated: >60 mL/min (ref >60)
Glucose, Bld: 123 mg/dL — ABNORMAL HIGH (ref 70–99)
Potassium: 4.2 mmol/L (ref 3.5–5.1)
Sodium: 135 mmol/L (ref 135–145)
Total Bilirubin: 0.5 mg/dL (ref <1.2)
Total Protein: 6.8 g/dL (ref 6.5–8.1)

## 2023-06-17 NOTE — ED Triage Notes (Signed)
Pr arrived reporting lower back pain, fell two days ago out of chair. Denies head inj or LOC. MAE. Patient also reports diarrhea for 2 months, no blood.

## 2023-06-17 NOTE — ED Notes (Signed)
Pt's husband came to the nurses' station and reported she was "ready to go."  This Clinical research associate informed the Pt and her husband of everything that had been ordered and tried to talk Pt into staying.  Pt stated "I've been here all day and I'm leaving."  This writer offered to get the EDP, so he could speak to her before she left and the Pt refused.    NAD noted.  Pt showed to lobby.  EDP made aware.

## 2023-06-17 NOTE — ED Provider Notes (Signed)
[place] [facility]PARTMENT AT [facility] Provider Note   CSN: [id number] Arrival date & time: 06/17/23  1254     History  Chief Complaint  Patient presents with   Back Pain    [name] is a 82 y.o. female.   Back Pain Patient with history of dementia.  Reportedly has had urinary and fecal incontinence.  Is had diarrhea.  Has been going on for the last couple weeks.  Also reportedly had a fall a few days ago where she hurt her back.  Generally weak.  Denies cancer history.  Presents with family member.    History reviewed. No pertinent past medical history.  Home Medications Prior to Admission medications   Medication Sig Start Date End Date Taking? Authorizing Provider  ALPRAZolam (XANAX) 0.25 MG tablet Take 0.25 mg by mouth as needed. 09/22/14   [provider]  amoxicillin-clavulanate (AUGMENTIN) 875-125 MG tablet Take 1 tablet by mouth every 12 (twelve) hours. 06/14/23   Barrett, Horald Chestnut, PA-C  aspirin EC 81 MG tablet Take 81 mg by mouth daily.    [provider]  Calcium-Vitamin D-Vitamin K 650-12.5-40 MG-MCG-MCG CHEW Chew by mouth.    [provider]  Cholecalciferol (VITAMIN D3 PO) Take by mouth daily.    [provider]  clindamycin (CLEOCIN) 150 MG capsule Take 2 capsules (300 mg total) by mouth 3 (three) times daily. 06/14/23   Barrett, Horald Chestnut, PA-C  Cobalamin Combinations (VITAMIN B12-FOLIC ACID) 500-400 MCG TABS Take 1 tablet by mouth daily.    [provider]  donepezil (ARICEPT) 10 MG tablet Take 1 tablet by mouth daily. 04/07/23 04/06/24  [provider]  donepezil (ARICEPT) 5 MG tablet Take 5 mg by mouth daily. 03/31/23   [provider]  erythromycin ophthalmic ointment Place 1 Application into the left eye 4 (four) times daily. 04/11/23   [provider]  escitalopram (LEXAPRO) 20 MG tablet Take 20 mg by mouth daily.      [provider]  fexofenadine (ALLEGRA) 60  MG tablet 1-2 times daily if needed 01/25/20   Bobbitt, Heywood Iles, MD  fluticasone [facility]) 50 MCG/ACT nasal spray 1-2 sprays per nostril daily if needed 01/25/20   Bobbitt, Heywood Iles, MD  Lactobacillus [facility] WOMEN) CAPS Take 1 capsule by mouth daily at 6 (six) AM. 06/02/20   [provider]  Multiple Vitamin (MULTIVITAMIN) capsule Take 1 capsule by mouth daily. VIT. B,D,E     [provider]  Probiotic Product (PROBIOTIC PO) Take by mouth daily.    [provider]  pyridoxine (B-6) 250 MG tablet Take 250 mg by mouth daily.    [provider]  raloxifene (EVISTA) 60 MG tablet Take 60 mg by mouth daily.    [provider]  vitamin B-12 (CYANOCOBALAMIN) 500 MCG tablet Take 500 mcg by mouth daily.    [provider]  vitamin E 400 UNIT capsule Take 400 Units by mouth daily.    [provider]      Allergies    Sulfamethoxazole-trimethoprim and Sulfa antibiotics    Review of Systems   Review of Systems  Musculoskeletal:  Positive for back pain.    Physical Exam Updated Vital Signs BP (!) 124/52 (BP Location: Left Arm)   Pulse 93   Temp 98.6 F (37 C) (Oral)   Resp 16   Ht 4\' 11"  (1.499 m)   Wt 43 kg   SpO2 98%   BMI 19.15 kg/m  Physical Exam Vitals and nursing note reviewed.  Cardiovascular:     Rate and Rhythm: Regular rhythm.  Abdominal:     Tenderness: There is abdominal tenderness.     Comments: Mild diffuse tenderness without rebound or guarding.  No hernia palpated.  Musculoskeletal:        General: No tenderness.  Skin:    General: Skin is warm.  Neurological:     Mental Status: She is alert. Mental status is at baseline.     ED Results / Procedures / Treatments   Labs (all labs ordered are listed, but only abnormal results are displayed) Labs Reviewed  COMPREHENSIVE METABOLIC PANEL - Abnormal; Notable for the following components:      Result Value   Glucose, Bld 123 (*)    All other  components within normal limits  CBC WITH DIFFERENTIAL/PLATELET - Abnormal; Notable for the following components:   RBC 3.53 (*)    Hemoglobin 11.8 (*)    HCT 34.7 (*)    All other components within normal limits  URINALYSIS, ROUTINE W REFLEX MICROSCOPIC    EKG None  Radiology No results found.  Procedures Procedures    Medications Ordered in ED Medications - No data to display  ED Course/ Medical Decision Making/ A&P                                 Medical Decision Making Amount and/or Complexity of Data Reviewed Labs: ordered. Radiology: ordered.   Patient with urinary and fecal incontinence.  Worse recently.  Reportedly has had accidents in bed.  However did have a fall with the incontinence had been before the fall.  Strength appears grossly intact.  Family member states that she has had symptoms like this with bowel obstruction previously.  Also with back pain and there is worry for causes such as cauda equina.  Blood work reassuring.  Patient left.  Was not willing to stay for further treatment.        Final Clinical Impression(s) / ED Diagnoses Final diagnoses:  Acute midline low back pain without sciatica  Urinary incontinence, unspecified type    Rx / DC Orders ED Discharge Orders     None         Benjiman Core, MD 06/17/23 1640

## 2023-06-20 ENCOUNTER — Ambulatory Visit (INDEPENDENT_AMBULATORY_CARE_PROVIDER_SITE_OTHER): Payer: Medicare PPO

## 2023-06-20 ENCOUNTER — Ambulatory Visit
Admission: EM | Admit: 2023-06-20 | Discharge: 2023-06-20 | Disposition: A | Discharge status: Home / Self Care | Payer: Medicare PPO | Attending: Physician Assistant | Admitting: Physician Assistant

## 2023-06-20 DIAGNOSIS — M546 Pain in thoracic spine: Secondary | ICD-10-CM

## 2023-06-20 DIAGNOSIS — S22070A Wedge compression fracture of T9-T10 vertebra, initial encounter for closed fracture: Secondary | ICD-10-CM

## 2023-06-20 DIAGNOSIS — X58XXXA Exposure to other specified factors, initial encounter: Secondary | ICD-10-CM | POA: Diagnosis not present

## 2023-06-20 DIAGNOSIS — R3915 Urgency of urination: Secondary | ICD-10-CM | POA: Diagnosis present

## 2023-06-20 LAB — POCT URINALYSIS DIP (MANUAL ENTRY)
Bilirubin, UA: NEGATIVE
Glucose, UA: NEGATIVE mg/dL
Ketones, POC UA: NEGATIVE mg/dL
Nitrite, UA: NEGATIVE
Protein Ur, POC: 30 mg/dL — AB
Spec Grav, UA: 1.02 (ref 1.010–1.025)
Urobilinogen, UA: 0.2 U/dL
pH, UA: 7 (ref 5.0–8.0)

## 2023-06-20 MED ORDER — LIDOCAINE 5 % EX PTCH: 1.0000 | MEDICATED_PATCH | CUTANEOUS | 0 refills | Status: DC

## 2023-06-20 NOTE — ED Provider Notes (Signed)
[name]    CSN: [id number] Arrival date & time: 06/20/23  1111      History   Chief Complaint Chief Complaint  Patient presents with   Back Pain    HPI [name] is a 82 y.o. female.   Patient today accompanied by her husband who provided the majority of history as she has a history of cognitive impairment.  Reports a several weeks history right back pain.  Does report that she fell sideways out of a chair when her symptoms first began but did not hit her ribs/side on anything I do not believe that this contributed to her pain.  She does have chronic urinary incontinence and urgency but this is unchanged from baseline.  Denies any dysuria, fever, nausea, vomiting.  She has tried ibuprofen without improvement of symptoms.  She reports that her pain is rated 8 on a 0-10 pain scale, described as aching, no aggravating or alleviating factors identified.  She was recently treated for a periorbital cellulitis with clindamycin but denies additional antibiotics in the past 90 days.  Denies any cough, chest pain, shortness of breath.  She was seen in the emergency room on 06/17/2023 at which point CBC and CMP were essentially normal and imaging was not obtained as she left prior to completing evaluation.    History reviewed. No pertinent past medical history.  Patient Active Problem List   Diagnosis Date Noted   Cerebrovascular disease 02/04/2023   Moderate vascular dementia without behavioral disturbance, psychotic disturbance, mood disturbance, or anxiety (HCC) 02/04/2023   Memory loss 12/30/2022   Unsteady gait 12/30/2022   Atopic dermatitis 02/12/2021   Gastro-esophageal reflux disease without esophagitis 02/12/2021   Gingivitis 02/12/2021   Impairment of balance 02/12/2021   Irritable bowel syndrome with diarrhea 02/12/2021   Major depressive disorder 02/12/2021   Plantar wart of right foot 02/12/2021   Urinary hesitancy 02/12/2021   Chondrodermatitis  nodularis helicis of left ear 12/06/2020   Allergic urticaria 01/25/2020   Perennial allergic rhinitis 01/25/2020   Perennial allergic rhinitis 01/25/2020   Hematuria 02/22/2019   Degenerative disc disease, lumbar 04/17/2018   Degenerative scoliosis in adult patient 04/17/2018   SI joint arthritis (HCC) 04/17/2018   Degenerative disc disease, cervical 09/09/2017   Spondylolisthesis of cervical region 09/09/2017   Chronic swimmer's ear of both sides 06/03/2017   Tobacco use 05/06/2017   COPD (chronic obstructive pulmonary disease) (HCC) 05/06/2017   History of renal calculi 01/14/2017   Anxiety 09/05/2016   Renal stone 09/05/2016   Hoarseness 06/20/2016   Laryngopharyngeal reflux (LPR) 06/20/2016    Past Surgical History:  Procedure Laterality Date   CHOLECYSTECTOMY  10/24/2010   COLECTOMY  2006   BENIGN TUMOR    OB History   No obstetric history on file.      Home Medications    Prior to Admission medications   Medication Sig Start Date End Date Taking? Authorizing Provider  amoxicillin-clavulanate (AUGMENTIN) 875-125 MG tablet Take 1 tablet by mouth every 12 (twelve) hours. 06/14/23  Yes Barrett, Horald Chestnut, PA-C  clindamycin (CLEOCIN) 150 MG capsule Take 2 capsules (300 mg total) by mouth 3 (three) times daily. 06/14/23  Yes Barrett, Horald Chestnut, PA-C  ALPRAZolam (XANAX) 0.25 MG tablet Take 0.25 mg by mouth as needed. 09/22/14   [provider]  aspirin EC 81 MG tablet Take 81 mg by mouth daily.    [provider]  Calcium-Vitamin D-Vitamin K 650-12.5-40 MG-MCG-MCG CHEW Chew by mouth.  [provider]  Cholecalciferol (VITAMIN D3 PO) Take by mouth daily.    [provider]  Cobalamin Combinations (VITAMIN B12-FOLIC ACID) 500-400 MCG TABS Take 1 tablet by mouth daily.    [provider]  donepezil (ARICEPT) 10 MG tablet Take 1 tablet by mouth daily. 04/07/23 04/06/24  [provider]  donepezil (ARICEPT) 5 MG tablet Take 5 mg by  mouth daily. 03/31/23   [provider]  doxycycline (VIBRAMYCIN) 100 MG capsule Take 100 mg by mouth 2 (two) times daily.    [provider]  erythromycin ophthalmic ointment Place 1 Application into the left eye 4 (four) times daily. 04/11/23   [provider]  escitalopram (LEXAPRO) 20 MG tablet Take 20 mg by mouth daily.      [provider]  fexofenadine (ALLEGRA) 60 MG tablet 1-2 times daily if needed 01/25/20   Bobbitt, Heywood Iles, MD  fluticasone [facility]) 50 MCG/ACT nasal spray 1-2 sprays per nostril daily if needed 01/25/20   Bobbitt, Heywood Iles, MD  Lactobacillus [facility] WOMEN) CAPS Take 1 capsule by mouth daily at 6 (six) AM. 06/02/20   [provider]  lidocaine (LIDODERM) 5 % Place 1 patch onto the skin daily. Remove & Discard patch within 12 hours or as directed by MD 06/20/23  Yes Rilyn Scroggs, Noberto Retort, PA-C  Multiple Vitamin (MULTIVITAMIN) capsule Take 1 capsule by mouth daily. VIT. B,D,E     [provider]  Probiotic Product (PROBIOTIC PO) Take by mouth daily.    [provider]  pyridoxine (B-6) 250 MG tablet Take 250 mg by mouth daily.    [provider]  raloxifene (EVISTA) 60 MG tablet Take 60 mg by mouth daily.    [provider]  vitamin B-12 (CYANOCOBALAMIN) 500 MCG tablet Take 500 mcg by mouth daily.    [provider]  vitamin E 400 UNIT capsule Take 400 Units by mouth daily.    [provider]    Family History Family History  Problem Relation Age of Onset   Cancer Brother    Diabetes Brother    Heart disease Brother    Cancer Sister    Diabetes Sister    Breast cancer Neg Hx     Social History Social History   Tobacco Use   Smoking status: Every Day    Current packs/day: 1.00    Average packs/day: 1 pack/day for 50.0 years (50.0 ttl pk-yrs)    Types: Cigarettes   Smokeless tobacco: Never  Vaping Use   Vaping status: Never Used  Substance Use Topics   Alcohol  use: No   Drug use: No     Allergies   Sulfamethoxazole-trimethoprim, Sulfa antibiotics, and Sulfacetamide   Review of Systems Review of Systems  Constitutional:  Positive for activity change. Negative for appetite change, fatigue and fever.  Respiratory:  Negative for cough and shortness of breath.   Cardiovascular:  Negative for chest pain.  Gastrointestinal:  Negative for abdominal pain, diarrhea, nausea and vomiting.  Genitourinary:  Positive for enuresis (chronic), frequency and urgency. Negative for dysuria.  Musculoskeletal:  Positive for back pain. Negative for arthralgias and myalgias.     Physical Exam Triage Vital Signs ED Triage Vitals  Encounter Vitals Group     BP 06/20/23 1156 118/74     Systolic BP Percentile --      Diastolic BP Percentile --      Pulse Rate 06/20/23 1156 82     Resp 06/20/23 1156 18  Temp 06/20/23 1156 98.4 F (36.9 C)     Temp Source 06/20/23 1156 Oral     SpO2 06/20/23 1156 96 %     Weight 06/20/23 1154 94 lb 12.8 oz (43 kg)     Height 06/20/23 1154 4\' 11"  (1.499 m)     Head Circumference --      Peak Flow --      Pain Score 06/20/23 1147 8     Pain Loc --      Pain Education --      Exclude from Growth Chart --    No data found.  Updated Vital Signs BP 118/74 (BP Location: Left Arm)   Pulse 82   Temp 98.4 F (36.9 C) (Oral)   Resp 18   Ht 4\' 11"  (1.499 m)   Wt 94 lb 12.8 oz (43 kg)   SpO2 96%   BMI 19.15 kg/m   Visual Acuity Right Eye Distance:   Left Eye Distance:   Bilateral Distance:    Right Eye Near:   Left Eye Near:    Bilateral Near:     Physical Exam Vitals reviewed.  Constitutional:      General: She is awake. She is not in acute distress.    Appearance: Normal appearance. She is well-developed. She is not ill-appearing.     Comments: Very pleasant female appears stated age in no acute distress sitting comfortably in exam room  HENT:     Head: Normocephalic and atraumatic.  Cardiovascular:      Rate and Rhythm: Normal rate and regular rhythm.     Heart sounds: Normal heart sounds, S1 normal and S2 normal. No murmur heard. Pulmonary:     Effort: Pulmonary effort is normal.     Breath sounds: Normal breath sounds. No wheezing, rhonchi or rales.     Comments: Clear to auscultation bilaterally Abdominal:     General: Bowel sounds are normal.     Palpations: Abdomen is soft.     Tenderness: There is no abdominal tenderness. There is no right CVA tenderness, left CVA tenderness, guarding or rebound.  Musculoskeletal:     Cervical back: No tenderness or bony tenderness.     Thoracic back: No tenderness or bony tenderness.     Lumbar back: No tenderness or bony tenderness.       Back:     Comments: Pain is not reproducible on exam.  No tenderness to palpation.  No pain percussion of vertebrae.  Normal active range of motion.  Psychiatric:        Behavior: Behavior is cooperative.      UC Treatments / Results  Labs (all labs ordered are listed, but only abnormal results are displayed) Labs Reviewed  POCT URINALYSIS DIP (MANUAL ENTRY) - Abnormal; Notable for the following components:      Result Value   Clarity, UA turbid (*)    Blood, UA large (*)    Protein Ur, POC =30 (*)    Leukocytes, UA Trace (*)    All other components within normal limits  URINE CULTURE    EKG   Radiology DG Ribs Unilateral W/Chest Right  Result Date: 06/20/2023 CLINICAL DATA:  Persistent back and rib pain after falling approximately 2 weeks ago. EXAM: RIGHT RIBS AND CHEST - 3+ VIEW; THORACIC SPINE 2 VIEWS COMPARISON:  Chest CT 06/18/2017.  Chest radiographs 11/20/2015. FINDINGS: The heart size and mediastinal contours are stable. There is mild aortic atherosclerosis. The lungs are clear. There is no  pleural effusion or pneumothorax. No evidence of acute right-sided rib fracture or focal rib lesion. There are 12 rib-bearing thoracic type vertebral bodies. There is a mild biconvex thoracolumbar  scoliosis, convex to the right at T6-7 and to the left at L1. Mild superior endplate compression deformity at T10 is new from remote CT and potentially acute. No other acute osseous findings. Mild thoracic spine degenerative changes. IMPRESSION: 1. No evidence of acute right-sided rib fracture or focal rib lesion. 2. Mild superior endplate compression deformity at T10, potentially acute. Correlate with point tenderness. 3. No evidence of acute cardiopulmonary process. Electronically Signed   By: Carey Bullocks M.D.   On: 06/20/2023 13:25   DG Thoracic Spine 2 View  Result Date: 06/20/2023 CLINICAL DATA:  Persistent back and rib pain after falling approximately 2 weeks ago. EXAM: RIGHT RIBS AND CHEST - 3+ VIEW; THORACIC SPINE 2 VIEWS COMPARISON:  Chest CT 06/18/2017.  Chest radiographs 11/20/2015. FINDINGS: The heart size and mediastinal contours are stable. There is mild aortic atherosclerosis. The lungs are clear. There is no pleural effusion or pneumothorax. No evidence of acute right-sided rib fracture or focal rib lesion. There are 12 rib-bearing thoracic type vertebral bodies. There is a mild biconvex thoracolumbar scoliosis, convex to the right at T6-7 and to the left at L1. Mild superior endplate compression deformity at T10 is new from remote CT and potentially acute. No other acute osseous findings. Mild thoracic spine degenerative changes. IMPRESSION: 1. No evidence of acute right-sided rib fracture or focal rib lesion. 2. Mild superior endplate compression deformity at T10, potentially acute. Correlate with point tenderness. 3. No evidence of acute cardiopulmonary process. Electronically Signed   By: Carey Bullocks M.D.   On: 06/20/2023 13:25    Procedures Procedures (including critical care time)  Medications Ordered in UC Medications - No data to display  Initial Impression / Assessment and Plan / UC Course  I have reviewed the triage vital signs and the nursing notes.  Pertinent labs  & imaging results that were available during my care of the patient were reviewed by me and considered in my medical decision making (see chart for details).     Patient is well-appearing, afebrile, nontoxic, nontachycardic.  Given her recent fall x-ray of ribs and thoracic spine were obtained that did show possible compression fracture at T10 as etiology of symptoms.  No evidence of acute cardiopulmonary disease.  She did not have any CVA tenderness or significant abdominal pain.  Urine did have trace leukocytes but I suspect this is related to chronic incontinence.  Will send this for culture but defer antibiotics until results are available.  She was given lidocaine patches for symptom management.  Recommended that they obtain an incentive spirometer to use regularly to prevent complications such as atelectasis or pneumonia.  I did recommend that she follow-up with orthopedics to consider additional intervention was given contact information for local provider with instruction to call to schedule an appointment.  If she has any worsening or changing symptoms she needs to go to the emergency room.  Recommend close follow-up with primary care.  Strict return precautions given.  Final Clinical Impressions(s) / UC Diagnoses   Final diagnoses:  Acute right-sided thoracic back pain  Urgency of urination  Compression fracture of T10 vertebra, initial encounter [facility])     Discharge Instructions      I am concerned that she has a compression fracture in her back.  Continue using over-the-counter medications such as Tylenol and  low-dose ibuprofen as needed.  I have also called in lidocaine patches to help with pain.  Apply this for 12 hours and then remove it for 12 hours.  Use only 1 patch per 24 hours.  I believe this is the cause of her pain but we will send off her urine to ensure that she does not have an infection and contact you if we need to start an antibiotic.  If anything worsens or changes she  needs to be seen immediately.  Follow-up with orthopedics as soon as possible; call to schedule an appointment.     ED Prescriptions     Medication Sig Dispense Auth. Provider   lidocaine (LIDODERM) 5 % Place 1 patch onto the skin daily. Remove & Discard patch within 12 hours or as directed by MD 14 patch Jariyah Hackley K, PA-C      I have reviewed the PDMP during this encounter.   Jeani Hawking, PA-C 06/20/23 1336

## 2023-06-20 NOTE — ED Triage Notes (Signed)
"  My back is killing me and I am having some diarrhea/loose stools". Pain is "lower right/left" part of back. "It has been a week or two but getting worse". "I did fall not long ago but no pain right after that". Date of fall/injury "about 2 wks ago, fell sideways out of chair, twisting kind of fall onto floor, no head injury, no laceration". "I have some memory problems so I don't remember everything". No dysuria "but I do have an over active bladder". No fever known.

## 2023-06-20 NOTE — Discharge Instructions (Signed)
I am concerned that she has a compression fracture in her back.  Continue using over-the-counter medications such as Tylenol and low-dose ibuprofen as needed.  I have also called in lidocaine patches to help with pain.  Apply this for 12 hours and then remove it for 12 hours.  Use only 1 patch per 24 hours.  I believe this is the cause of her pain but we will send off her urine to ensure that she does not have an infection and contact you if we need to start an antibiotic.  If anything worsens or changes she needs to be seen immediately.  Follow-up with orthopedics as soon as possible; call to schedule an appointment.

## 2023-06-21 LAB — URINE CULTURE: Culture: <10000 — AB

## 2023-07-03 ENCOUNTER — Ambulatory Visit
Admission: EM | Admit: 2023-07-03 | Discharge: 2023-07-03 | Disposition: A | Discharge status: Home / Self Care | Payer: Medicare PPO | Attending: Physician Assistant | Admitting: Physician Assistant

## 2023-07-03 DIAGNOSIS — R197 Diarrhea, unspecified: Secondary | ICD-10-CM | POA: Diagnosis present

## 2023-07-03 LAB — C DIFFICILE QUICK SCREEN W PCR REFLEX
C Diff antigen: NEGATIVE
C Diff interpretation: NOT DETECTED
C Diff toxin: NEGATIVE

## 2023-07-03 NOTE — ED Triage Notes (Signed)
Pt states diarrhea for one month states it stopped for a week and then started again 2 days ago.

## 2023-07-04 ENCOUNTER — Telehealth: Payer: Self-pay | Admitting: Family Medicine

## 2023-07-04 LAB — GASTROINTESTINAL PANEL BY PCR, STOOL (REPLACES STOOL CULTURE)

## 2023-07-04 MED ORDER — CIPROFLOXACIN HCL 500 MG PO TABS: 500.0000 mg | ORAL_TABLET | Freq: Two times a day (BID) | ORAL | 0 refills | Status: AC

## 2023-07-04 NOTE — Telephone Encounter (Signed)
Called and spoke with the husband. Regarding positive GIPP results positive for ETEC. Treat with Cipro 500 mg BID for 3 days.   Katha Cabal, DO

## 2023-07-08 LAB — O&P RESULT

## 2023-07-08 LAB — OVA + PARASITE EXAM

## 2023-07-08 NOTE — ED Provider Notes (Signed)
[name]    CSN: [id number] Arrival date & time: 07/03/23  1407      History   Chief Complaint Chief Complaint  Patient presents with   Diarrhea    HPI [name] is a 82 y.o. female.   Patient here today for evaluation of diarrhea she has had last month.  She reports that she did have discontinuation of symptoms for about a week and then that restarted 2 days ago.  She has not had any blood in her stool or dark tarry stools.  She denies any fever.  The history is provided by the patient.  Diarrhea Associated symptoms: no abdominal pain, no chills, no fever and no vomiting     History reviewed. No pertinent past medical history.  Patient Active Problem List   Diagnosis Date Noted   Cerebrovascular disease 02/04/2023   Moderate vascular dementia without behavioral disturbance, psychotic disturbance, mood disturbance, or anxiety (HCC) 02/04/2023   Memory loss 12/30/2022   Unsteady gait 12/30/2022   Atopic dermatitis 02/12/2021   Gastro-esophageal reflux disease without esophagitis 02/12/2021   Gingivitis 02/12/2021   Impairment of balance 02/12/2021   Irritable bowel syndrome with diarrhea 02/12/2021   Major depressive disorder 02/12/2021   Plantar wart of right foot 02/12/2021   Urinary hesitancy 02/12/2021   Chondrodermatitis nodularis helicis of left ear 12/06/2020   Allergic urticaria 01/25/2020   Perennial allergic rhinitis 01/25/2020   Perennial allergic rhinitis 01/25/2020   Hematuria 02/22/2019   Degenerative disc disease, lumbar 04/17/2018   Degenerative scoliosis in adult patient 04/17/2018   SI joint arthritis (HCC) 04/17/2018   Degenerative disc disease, cervical 09/09/2017   Spondylolisthesis of cervical region 09/09/2017   Chronic swimmer's ear of both sides 06/03/2017   Tobacco use 05/06/2017   COPD (chronic obstructive pulmonary disease) (HCC) 05/06/2017   History of renal calculi 01/14/2017   Anxiety 09/05/2016   Renal  stone 09/05/2016   Hoarseness 06/20/2016   Laryngopharyngeal reflux (LPR) 06/20/2016    Past Surgical History:  Procedure Laterality Date   CHOLECYSTECTOMY  10/24/2010   COLECTOMY  2006   BENIGN TUMOR    OB History   No obstetric history on file.      Home Medications    Prior to Admission medications   Medication Sig Start Date End Date Taking? Authorizing Provider  ALPRAZolam (XANAX) 0.25 MG tablet Take 0.25 mg by mouth as needed. 09/22/14   [provider]  aspirin EC 81 MG tablet Take 81 mg by mouth daily.    [provider]  Calcium-Vitamin D-Vitamin K 650-12.5-40 MG-MCG-MCG CHEW Chew by mouth.    [provider]  Cholecalciferol (VITAMIN D3 PO) Take by mouth daily.    [provider]  Cobalamin Combinations (VITAMIN B12-FOLIC ACID) 500-400 MCG TABS Take 1 tablet by mouth daily.    [provider]  donepezil (ARICEPT) 10 MG tablet Take 1 tablet by mouth daily. 04/07/23 04/06/24  [provider]  donepezil (ARICEPT) 5 MG tablet Take 5 mg by mouth daily. 03/31/23   [provider]  erythromycin ophthalmic ointment Place 1 Application into the left eye 4 (four) times daily. 04/11/23   [provider]  escitalopram (LEXAPRO) 20 MG tablet Take 20 mg by mouth daily.      [provider]  fexofenadine (ALLEGRA) 60 MG tablet 1-2 times daily if needed 01/25/20   Bobbitt, Heywood Iles, MD  fluticasone [facility]) 50 MCG/ACT nasal spray 1-2 sprays per nostril daily if needed 01/25/20  Bobbitt, Heywood Iles, MD  Lactobacillus [facility] WOMEN) CAPS Take 1 capsule by mouth daily at 6 (six) AM. 06/02/20   [provider]  lidocaine (LIDODERM) 5 % Place 1 patch onto the skin daily. Remove & Discard patch within 12 hours or as directed by MD 06/20/23   Raspet, Noberto Retort, PA-C  Multiple Vitamin (MULTIVITAMIN) capsule Take 1 capsule by mouth daily. VIT. B,D,E     [provider]  Probiotic Product (PROBIOTIC PO)  Take by mouth daily.    [provider]  pyridoxine (B-6) 250 MG tablet Take 250 mg by mouth daily.    [provider]  raloxifene (EVISTA) 60 MG tablet Take 60 mg by mouth daily.    [provider]  vitamin B-12 (CYANOCOBALAMIN) 500 MCG tablet Take 500 mcg by mouth daily.    [provider]  vitamin E 400 UNIT capsule Take 400 Units by mouth daily.    [provider]    Family History Family History  Problem Relation Age of Onset   Cancer Brother    Diabetes Brother    Heart disease Brother    Cancer Sister    Diabetes Sister    Breast cancer Neg Hx     Social History Social History   Tobacco Use   Smoking status: Every Day    Current packs/day: 1.00    Average packs/day: 1 pack/day for 50.0 years (50.0 ttl pk-yrs)    Types: Cigarettes   Smokeless tobacco: Never  Vaping Use   Vaping status: Never Used  Substance Use Topics   Alcohol use: No   Drug use: No     Allergies   Sulfamethoxazole-trimethoprim, Sulfa antibiotics, and Sulfacetamide   Review of Systems Review of Systems  Constitutional:  Negative for chills and fever.  Eyes:  Negative for discharge and redness.  Respiratory:  Negative for shortness of breath.   Gastrointestinal:  Positive for diarrhea. Negative for abdominal pain, nausea and vomiting.     Physical Exam Triage Vital Signs ED Triage Vitals  Encounter Vitals Group     BP 07/03/23 1435 112/67     Systolic BP Percentile --      Diastolic BP Percentile --      Pulse Rate 07/03/23 1435 80     Resp 07/03/23 1435 16     Temp 07/03/23 1435 98.2 F (36.8 C)     Temp Source 07/03/23 1435 Oral     SpO2 07/03/23 1435 96 %     Weight --      Height --      Head Circumference --      Peak Flow --      Pain Score 07/03/23 1437 0     Pain Loc --      Pain Education --      Exclude from Growth Chart --    No data found.  Updated Vital Signs BP 112/67 (BP Location: Left Arm)   Pulse 80   Temp  98.2 F (36.8 C) (Oral)   Resp 16   SpO2 96%   Physical Exam Vitals and nursing note reviewed.  Constitutional:      General: She is not in acute distress.    Appearance: Normal appearance. She is not ill-appearing.  HENT:     Head: Normocephalic and atraumatic.  Eyes:     Conjunctiva/sclera: Conjunctivae normal.  Cardiovascular:     Rate and Rhythm: Normal rate.  Pulmonary:     Effort: Pulmonary effort is  normal. No respiratory distress.  Neurological:     Mental Status: She is alert.  Psychiatric:        Mood and Affect: Mood normal.        Behavior: Behavior normal.        Thought Content: Thought content normal.      UC Treatments / Results  Labs (all labs ordered are listed, but only abnormal results are displayed) Labs Reviewed  GASTROINTESTINAL PANEL BY PCR, STOOL (REPLACES STOOL CULTURE) - Abnormal; Notable for the following components:      Result Value   Enterotoxigenic E coli (ETEC) DETECTED (*)    All other components within normal limits  OVA + PARASITE EXAM  C DIFFICILE QUICK SCREEN W PCR REFLEX    O&P RESULT    EKG   Radiology No results found.  Procedures Procedures (including critical care time)  Medications Ordered in UC Medications - No data to display  Initial Impression / Assessment and Plan / UC Course  I have reviewed the triage vital signs and the nursing notes.  Pertinent labs & imaging results that were available during my care of the patient were reviewed by me and considered in my medical decision making (see chart for details).   Will order GI panel, ova and parasite and C. difficile for further evaluation of continued diarrhea.  Recommend further evaluation in the emergency room with any worsening.   Final Clinical Impressions(s) / UC Diagnoses   Final diagnoses:  Diarrhea, unspecified type   Discharge Instructions   None    ED Prescriptions   None    PDMP not reviewed this encounter.   Tomi Bamberger,  PA-C 07/08/23 2021

## 2023-10-14 ENCOUNTER — Inpatient Hospital Stay (HOSPITAL_COMMUNITY)

## 2023-10-14 ENCOUNTER — Emergency Department (HOSPITAL_COMMUNITY)

## 2023-10-14 ENCOUNTER — Inpatient Hospital Stay (HOSPITAL_COMMUNITY)
Admission: EM | Admit: 2023-10-14 | Discharge: 2023-10-20 | DRG: 871 | Disposition: A | Discharge status: Skilled Nursing Facility | Attending: Internal Medicine | Admitting: Internal Medicine

## 2023-10-14 DIAGNOSIS — R410 Disorientation, unspecified: Secondary | ICD-10-CM | POA: Diagnosis not present

## 2023-10-14 DIAGNOSIS — Z8249 Family history of ischemic heart disease and other diseases of the circulatory system: Secondary | ICD-10-CM | POA: Diagnosis not present

## 2023-10-14 DIAGNOSIS — Z882 Allergy status to sulfonamides status: Secondary | ICD-10-CM | POA: Diagnosis not present

## 2023-10-14 DIAGNOSIS — J449 Chronic obstructive pulmonary disease, unspecified: Secondary | ICD-10-CM | POA: Diagnosis present

## 2023-10-14 DIAGNOSIS — I959 Hypotension, unspecified: Secondary | ICD-10-CM | POA: Diagnosis present

## 2023-10-14 DIAGNOSIS — Z7982 Long term (current) use of aspirin: Secondary | ICD-10-CM | POA: Diagnosis not present

## 2023-10-14 DIAGNOSIS — R32 Unspecified urinary incontinence: Secondary | ICD-10-CM | POA: Diagnosis present

## 2023-10-14 DIAGNOSIS — F17210 Nicotine dependence, cigarettes, uncomplicated: Secondary | ICD-10-CM | POA: Diagnosis present

## 2023-10-14 DIAGNOSIS — N3001 Acute cystitis with hematuria: Secondary | ICD-10-CM | POA: Diagnosis not present

## 2023-10-14 DIAGNOSIS — G9341 Metabolic encephalopathy: Secondary | ICD-10-CM | POA: Diagnosis present

## 2023-10-14 DIAGNOSIS — D539 Nutritional anemia, unspecified: Secondary | ICD-10-CM | POA: Diagnosis present

## 2023-10-14 DIAGNOSIS — Z8673 Personal history of transient ischemic attack (TIA), and cerebral infarction without residual deficits: Secondary | ICD-10-CM | POA: Diagnosis not present

## 2023-10-14 DIAGNOSIS — F32A Depression, unspecified: Secondary | ICD-10-CM | POA: Diagnosis present

## 2023-10-14 DIAGNOSIS — I340 Nonrheumatic mitral (valve) insufficiency: Secondary | ICD-10-CM | POA: Diagnosis not present

## 2023-10-14 DIAGNOSIS — R7881 Bacteremia: Secondary | ICD-10-CM

## 2023-10-14 DIAGNOSIS — Z79810 Long term (current) use of selective estrogen receptor modulators (SERMs): Secondary | ICD-10-CM

## 2023-10-14 DIAGNOSIS — H9190 Unspecified hearing loss, unspecified ear: Secondary | ICD-10-CM | POA: Diagnosis present

## 2023-10-14 DIAGNOSIS — F418 Other specified anxiety disorders: Secondary | ICD-10-CM | POA: Diagnosis not present

## 2023-10-14 DIAGNOSIS — R9082 White matter disease, unspecified: Secondary | ICD-10-CM | POA: Diagnosis present

## 2023-10-14 DIAGNOSIS — Z833 Family history of diabetes mellitus: Secondary | ICD-10-CM

## 2023-10-14 DIAGNOSIS — E860 Dehydration: Secondary | ICD-10-CM | POA: Diagnosis present

## 2023-10-14 DIAGNOSIS — F01C3 Vascular dementia, severe, with mood disturbance: Secondary | ICD-10-CM | POA: Diagnosis present

## 2023-10-14 DIAGNOSIS — K219 Gastro-esophageal reflux disease without esophagitis: Secondary | ICD-10-CM | POA: Diagnosis present

## 2023-10-14 DIAGNOSIS — E876 Hypokalemia: Secondary | ICD-10-CM | POA: Diagnosis present

## 2023-10-14 DIAGNOSIS — Z9049 Acquired absence of other specified parts of digestive tract: Secondary | ICD-10-CM

## 2023-10-14 DIAGNOSIS — N390 Urinary tract infection, site not specified: Secondary | ICD-10-CM | POA: Diagnosis present

## 2023-10-14 DIAGNOSIS — W010XXA Fall on same level from slipping, tripping and stumbling without subsequent striking against object, initial encounter: Secondary | ICD-10-CM | POA: Diagnosis present

## 2023-10-14 DIAGNOSIS — Z79899 Other long term (current) drug therapy: Secondary | ICD-10-CM | POA: Diagnosis not present

## 2023-10-14 DIAGNOSIS — A4181 Sepsis due to Enterococcus: Principal | ICD-10-CM | POA: Diagnosis present

## 2023-10-14 DIAGNOSIS — M810 Age-related osteoporosis without current pathological fracture: Secondary | ICD-10-CM | POA: Diagnosis present

## 2023-10-14 DIAGNOSIS — R262 Difficulty in walking, not elsewhere classified: Secondary | ICD-10-CM | POA: Diagnosis present

## 2023-10-14 DIAGNOSIS — N179 Acute kidney failure, unspecified: Secondary | ICD-10-CM | POA: Diagnosis present

## 2023-10-14 LAB — I-STAT CG4 LACTIC ACID, ED
Lactic Acid, Venous: 1.8 mmol/L (ref 0.5–1.9)
Lactic Acid, Venous: 0.5 mmol/L (ref 0.5–1.9)

## 2023-10-14 LAB — CBC
HCT: 27.4 % — ABNORMAL LOW (ref 36.0–46.0)
Hemoglobin: 9 g/dL — ABNORMAL LOW (ref 12.0–15.0)
MCH: 32.8 pg (ref 26.0–34.0)
MCHC: 32.8 g/dL (ref 30.0–36.0)
MCV: 100 fL (ref 80.0–100.0)
Platelets: 179 10*3/uL (ref 150–400)
RBC: 2.74 MIL/uL — ABNORMAL LOW (ref 3.87–5.11)
RDW: 14.3 % (ref 11.5–15.5)
WBC: 14.1 10*3/uL — ABNORMAL HIGH (ref 4.0–10.5)
nRBC: 0 % (ref 0.0–0.2)

## 2023-10-14 LAB — COMPREHENSIVE METABOLIC PANEL
ALT: 15 U/L (ref 0–44)
AST: 28 U/L (ref 15–41)
Albumin: 3.1 g/dL — ABNORMAL LOW (ref 3.5–5.0)
Alkaline Phosphatase: 43 U/L (ref 38–126)
Anion gap: 10 (ref 5–15)
BUN: 25 mg/dL — ABNORMAL HIGH (ref 8–23)
CO2: 22 mmol/L (ref 22–32)
Calcium: 8.1 mg/dL — ABNORMAL LOW (ref 8.9–10.3)
Chloride: 104 mmol/L (ref 98–111)
Creatinine, Ser: 1.38 mg/dL — ABNORMAL HIGH (ref 0.44–1.00)
GFR, Estimated: 38 mL/min — ABNORMAL LOW (ref >60)
Glucose, Bld: 148 mg/dL — ABNORMAL HIGH (ref 70–99)
Potassium: 3.3 mmol/L — ABNORMAL LOW (ref 3.5–5.1)
Sodium: 136 mmol/L (ref 135–145)
Total Bilirubin: 0.5 mg/dL (ref 0.0–1.2)
Total Protein: 6 g/dL — ABNORMAL LOW (ref 6.5–8.1)

## 2023-10-14 LAB — URINALYSIS, W/ REFLEX TO CULTURE (INFECTION SUSPECTED)
Bilirubin Urine: NEGATIVE
Glucose, UA: NEGATIVE mg/dL
Ketones, ur: 20 mg/dL — AB
Nitrite: NEGATIVE
Protein, ur: 100 mg/dL — AB
RBC / HPF: >50 RBC/hpf (ref 0–5)
Specific Gravity, Urine: 1.024 (ref 1.005–1.030)
WBC, UA: >50 WBC/hpf (ref 0–5)
pH: 5 (ref 5.0–8.0)

## 2023-10-14 LAB — CBG MONITORING, ED: Glucose-Capillary: 120 mg/dL — ABNORMAL HIGH (ref 70–99)

## 2023-10-14 MED ORDER — ACETAMINOPHEN 325 MG PO TABS
650.0000 mg | ORAL_TABLET | Freq: Four times a day (QID) | ORAL | Status: DC | PRN
  Administered 2023-10-14 – 2023-10-18 (×4): 650 mg via ORAL
  Filled 2023-10-14 (×5): qty 2

## 2023-10-14 MED ORDER — NICOTINE 7 MG/24HR TD PT24
7.0000 mg | MEDICATED_PATCH | Freq: Every day | TRANSDERMAL | Status: DC
  Administered 2023-10-14 – 2023-10-19 (×5): 7 mg via TRANSDERMAL
  Filled 2023-10-14 (×5): qty 1

## 2023-10-14 MED ORDER — SODIUM CHLORIDE 0.9 % IV BOLUS
500.0000 mL | Freq: Once | INTRAVENOUS | Status: AC
  Administered 2023-10-14: 500 mL via INTRAVENOUS

## 2023-10-14 MED ORDER — LACTATED RINGERS IV SOLN: INTRAVENOUS | Status: DC

## 2023-10-14 MED ORDER — SODIUM CHLORIDE 0.9 % IV SOLN
2.0000 g | Freq: Once | INTRAVENOUS | Status: AC
  Administered 2023-10-14: 2 g via INTRAVENOUS
  Filled 2023-10-14: qty 20

## 2023-10-14 MED ORDER — ADULT MULTIVITAMIN W/MINERALS CH
1.0000 | ORAL_TABLET | Freq: Every day | ORAL | Status: DC
  Administered 2023-10-14 – 2023-10-20 (×6): 1 via ORAL
  Filled 2023-10-14 (×7): qty 1

## 2023-10-14 MED ORDER — CEFTRIAXONE SODIUM 2 G IJ SOLR
2.0000 g | INTRAMUSCULAR | Status: DC
  Administered 2023-10-15: 2 g via INTRAVENOUS
  Filled 2023-10-14: qty 20

## 2023-10-14 MED ORDER — LACTATED RINGERS IV BOLUS
1000.0000 mL | Freq: Once | INTRAVENOUS | Status: AC
  Administered 2023-10-14: 1000 mL via INTRAVENOUS

## 2023-10-14 MED ORDER — ENOXAPARIN SODIUM 30 MG/0.3ML IJ SOSY
30.0000 mg | PREFILLED_SYRINGE | INTRAMUSCULAR | Status: DC
  Administered 2023-10-14 – 2023-10-19 (×5): 30 mg via SUBCUTANEOUS
  Filled 2023-10-14 (×4): qty 0.3

## 2023-10-14 NOTE — ED Provider Notes (Signed)
 [facility] AT [facility] Provider Note   CSN: [id number] Arrival date & time: 10/14/23  1016     History  Chief Complaint  Patient presents with   Weakness    [name] is a 83 y.o. female.   Weakness Patient presents with weakness confusion.  Reportedly on Saturday was working in the yard with her husband.  Does have a baseline vascular dementia.  States she did fell and hit her head.  Then had been more confused.  Difficulty walking.  Generalized weakness.  Reportedly had tactile temperature at home but thermometer did not read fever.  Reportedly also tactile fever for EMS.  Given Tylenol.  Patient is awake and does answer questions states she just feels bad.  Patient reportedly is been tremulous and was even having difficulty smoking.    No past medical history on file.  Home Medications Prior to Admission medications   Medication Sig Start Date End Date Taking? Authorizing Provider  ALPRAZolam (XANAX) 0.25 MG tablet Take 0.25 mg by mouth as needed. 09/22/14   [provider]  aspirin EC 81 MG tablet Take 81 mg by mouth daily.    [provider]  Calcium-Vitamin D-Vitamin K 650-12.5-40 MG-MCG-MCG CHEW Chew by mouth.    [provider]  Cholecalciferol (VITAMIN D3 PO) Take by mouth daily.    [provider]  Cobalamin Combinations (VITAMIN B12-FOLIC ACID) 500-400 MCG TABS Take 1 tablet by mouth daily.    [provider]  donepezil (ARICEPT) 10 MG tablet Take 1 tablet by mouth daily. 04/07/23 04/06/24  [provider]  donepezil (ARICEPT) 5 MG tablet Take 5 mg by mouth daily. 03/31/23   [provider]  erythromycin ophthalmic ointment Place 1 Application into the left eye 4 (four) times daily. 04/11/23   [provider]  escitalopram (LEXAPRO) 20 MG tablet Take 20 mg by mouth daily.      [provider]  fexofenadine (ALLEGRA) 60 MG tablet 1-2 times daily if needed  01/25/20   Bobbitt, Heywood Iles, MD  fluticasone [facility]) 50 MCG/ACT nasal spray 1-2 sprays per nostril daily if needed 01/25/20   Bobbitt, Heywood Iles, MD  Lactobacillus [facility] WOMEN) CAPS Take 1 capsule by mouth daily at 6 (six) AM. 06/02/20   [provider]  lidocaine (LIDODERM) 5 % Place 1 patch onto the skin daily. Remove & Discard patch within 12 hours or as directed by MD 06/20/23   Raspet, Noberto Retort, PA-C  Multiple Vitamin (MULTIVITAMIN) capsule Take 1 capsule by mouth daily. VIT. B,D,E     [provider]  Probiotic Product (PROBIOTIC PO) Take by mouth daily.    [provider]  pyridoxine (B-6) 250 MG tablet Take 250 mg by mouth daily.    [provider]  raloxifene (EVISTA) 60 MG tablet Take 60 mg by mouth daily.    [provider]  vitamin B-12 (CYANOCOBALAMIN) 500 MCG tablet Take 500 mcg by mouth daily.    [provider]  vitamin E 400 UNIT capsule Take 400 Units by mouth daily.    [provider]      Allergies    Sulfamethoxazole-trimethoprim, Sulfa antibiotics, and Sulfacetamide    Review of Systems   Review of Systems  Neurological:  Positive for weakness.    Physical Exam Updated Vital Signs BP (!) 94/47   Pulse 65   Temp 98.7 F (37.1 C) (Rectal)   Resp 16   Ht 4\' 11"  (1.499  m)   Wt 44.5 kg   SpO2 99%   BMI 19.79 kg/m  Physical Exam Vitals and nursing note reviewed.  Constitutional:      Comments: In bed with eyes closed.  HENT:     Head: Atraumatic.  Cardiovascular:     Rate and Rhythm: Regular rhythm.  Pulmonary:     Breath sounds: No wheezing.  Abdominal:     Tenderness: There is no abdominal tenderness.  Musculoskeletal:     Comments: No lumbar tenderness.  Neurological:     Comments: Sitting in bed with eyes closed.  Will follow commands and answer questions.     ED Results / Procedures / Treatments   Labs (all labs ordered are listed, but only abnormal results are  displayed) Labs Reviewed  COMPREHENSIVE METABOLIC PANEL - Abnormal; Notable for the following components:      Result Value   Potassium 3.3 (*)    Glucose, Bld 148 (*)    BUN 25 (*)    Creatinine, Ser 1.38 (*)    Calcium 8.1 (*)    Total Protein 6.0 (*)    Albumin 3.1 (*)    GFR, Estimated 38 (*)    All other components within normal limits  CBC - Abnormal; Notable for the following components:   WBC 14.1 (*)    RBC 2.74 (*)    Hemoglobin 9.0 (*)    HCT 27.4 (*)    All other components within normal limits  URINALYSIS, W/ REFLEX TO CULTURE (INFECTION SUSPECTED) - Abnormal; Notable for the following components:   APPearance HAZY (*)    Hgb urine dipstick SMALL (*)    Ketones, ur 20 (*)    Protein, ur 100 (*)    Leukocytes,Ua MODERATE (*)    Bacteria, UA RARE (*)    All other components within normal limits  CBG MONITORING, ED - Abnormal; Notable for the following components:   Glucose-Capillary 120 (*)    All other components within normal limits  CULTURE, BLOOD (ROUTINE X 2)  CULTURE, BLOOD (ROUTINE X 2)  URINE CULTURE  I-STAT CG4 LACTIC ACID, ED  I-STAT CG4 LACTIC ACID, ED    EKG None  Radiology DG Chest Portable 1 View Result Date: 10/14/2023 CLINICAL DATA:  Altered mental status. EXAM: PORTABLE CHEST 1 VIEW COMPARISON:  June 20, 2023. FINDINGS: The heart size and mediastinal contours are within normal limits. Both lungs are clear. The visualized skeletal structures are unremarkable. IMPRESSION: No active disease. Electronically Signed   By: Lupita Raider M.D.   On: 10/14/2023 13:50   CT HEAD WO CONTRAST ( ) Result Date: 10/14/2023 CLINICAL DATA:  Provided history: Head trauma, minor. Additional history provided: Fall yesterday (with head trauma). EXAM: CT HEAD WITHOUT CONTRAST TECHNIQUE: Contiguous axial images were obtained from the base of the skull through the vertex without intravenous contrast. RADIATION DOSE REDUCTION: This exam was performed according to  the departmental dose-optimization program which includes automated exposure control, adjustment of the mA and/or kV according to patient size and/or use of iterative reconstruction technique. COMPARISON:  Brain MRI 01/07/23 FINDINGS: Brain: Generalized cerebral and cerebellar atrophy. Advanced patchy and confluent hypoattenuation within the cerebral white matter, nonspecific but most often secondary to chronic small vessel ischemia. There is no acute intracranial hemorrhage. No demarcated cortical infarct. No extra-axial fluid collection. No evidence of an intracranial mass. No midline shift. Vascular: No hyperdense vessel. Atherosclerotic calcifications. Skull: No calvarial fracture or aggressive osseous lesion. Sinuses/Orbits: No mass or acute finding within  the imaged orbits. No significant paranasal sinus disease at the imaged levels. IMPRESSION: 1. No evidence of an acute intracranial abnormality. 2. Advanced cerebral white matter disease, nonspecific but most often secondary to chronic small vessel ischemia. 3. Generalized parenchymal atrophy. Electronically Signed   By: Jackey Loge D.O.   On: 10/14/2023 12:07    Procedures Procedures    Medications Ordered in ED Medications  sodium chloride 0.9 % bolus 500 mL (500 mLs Intravenous New Bag/Given 10/14/23 1251)  cefTRIAXone (ROCEPHIN) 2 g in sodium chloride 0.9 % 100 mL IVPB (2 g Intravenous New Bag/Given 10/14/23 1330)  lactated ringers bolus 1,000 mL (1,000 mLs Intravenous New Bag/Given 10/14/23 1331)    ED Course/ Medical Decision Making/ A&P                                 Medical Decision Making Amount and/or Complexity of Data Reviewed Labs: ordered. Radiology: ordered.   Patient with fall.  Mental status change.  Differential diagnoses long but includes cause such infection, intracranial hemorrhage.  Initial hypotension had improved on recheck.  Afebrile here but reportedly felt hot for EMS.  Will get rectal temperature.   Differential diagnosis does include infection also.  Patient is afebrile rectally.  White count mildly elevated and hemoglobin mildly decreased.  Creatinine mildly increased.  During the showed likely infection.  With the mental status changes definitely could be a cause.  Is hypotensive but has not been eating and drinking.  Likely component of dehydration.  Normal lactic acid x 2.  Antibiotics have been given.  Blood pressure improving now with fluid bolus.  Will discuss with hospitalist for admission.  With normal lactic acids I feel it is less likely this is a severe sepsis causing the hypertension.          Final Clinical Impression(s) / ED Diagnoses Final diagnoses:  Urinary tract infection without hematuria, site unspecified  Hypotension, unspecified hypotension type    Rx / DC Orders ED Discharge Orders     None         Benjiman Core, MD 10/14/23 [phone]

## 2023-10-14 NOTE — H&P (Signed)
 History and Physical    Patient: [name] ZOX:[id number] DOB: [birth year] DOA: 10/14/2023 DOS: the patient was seen and examined on 10/14/2023 PCP: Gwenyth Bender, FNP  Patient coming from: Home  Chief Complaint:  Chief Complaint  Patient presents with   Weakness   HPI: [name] is a 83 y.o. female with medical history significant of vascular dementia, overactive bladder with incontinence, history of TIA, and hearing loss.  History is provided by patient's spouse via telephone  Patient spouse states that 2 days prior to admission he and his wife were working in their backyard.  While she was throwing something in a dumpster, she slipped and fell, hitting her head.  She initially seemed to recover.  However, the following day her gait was intermittently abnormal.  She was not as coordinated and took small steps.  Eventually, she required her husband's assistance to move throughout the home.  She discussed with her husband that she felt lousy but did not say specifically what was bothering her.  Patient spouse notes that she went to her bathroom to smoke a cigarette early on the day of admission and she never came back to bed.  When he checked on her, she was sitting with her right hand trembling.  Since hitting her head patient has had a decreased appetite and had worsening urinary incontinence, which had previously been controlled with Myrbetriq.  She also had some confusion and was noted to feel feverish, though she had a temperature of 99 F.    ED course: In the ED, patient was noted to be hypotensive in the to 90s sytolic, afebrile (Tmax 99.58F), no tachypnea and normal O2 saturations. She had a likely AKI with serum creatinine 1.38, hypokalemia. Normal blood glucose. She had a macrocytic anemia that appears worse from most recent labs and a leukocytosis to 14k. Her lactates were WNL x2.  UA was notable for specific gravity on the upper limit of normal, urine ketones, protein,  blood, pyuria, and rare bacteria.  She also had hyaline casts present. CT of the Head which showed no acute intracranial abnormality, but notable for advanced cerebral white matter disease, and generalized parenchymal atrophy. EKG w/o evidence of acute ischemia.   Review of Systems: Patient not answering questions well. No past medical history on file. Past Surgical History:  Procedure Laterality Date   CHOLECYSTECTOMY  10/24/2010   COLECTOMY  2006   BENIGN TUMOR   Social History:  reports that she has been smoking cigarettes. She has a 50 pack-year smoking history. She has never used smokeless tobacco. She reports that she does not drink alcohol and does not use drugs.  Allergies  Allergen Reactions   Sulfamethoxazole-Trimethoprim Itching    Other Reaction(s): rash (09/2015)   Sulfa Antibiotics Itching    Other Reaction(s): Other (See Comments)   Sulfacetamide Itching    Family History  Problem Relation Age of Onset   Cancer Brother    Diabetes Brother    Heart disease Brother    Cancer Sister    Diabetes Sister    Breast cancer Neg Hx     Prior to Admission medications   Medication Sig Start Date End Date Taking? Authorizing Provider  ALPRAZolam (XANAX) 0.25 MG tablet Take 0.25 mg by mouth as needed. 09/22/14   [provider]  aspirin EC 81 MG tablet Take 81 mg by mouth daily.    [provider]  Calcium-Vitamin D-Vitamin K 650-12.5-40 MG-MCG-MCG CHEW Chew by mouth.  [provider]  Cholecalciferol (VITAMIN D3 PO) Take by mouth daily.    [provider]  Cobalamin Combinations (VITAMIN B12-FOLIC ACID) 500-400 MCG TABS Take 1 tablet by mouth daily.    [provider]  donepezil (ARICEPT) 10 MG tablet Take 1 tablet by mouth daily. 04/07/23 04/06/24  [provider]  donepezil (ARICEPT) 5 MG tablet Take 5 mg by mouth daily. 03/31/23   [provider]  erythromycin ophthalmic ointment Place 1 Application into the left eye  4 (four) times daily. 04/11/23   [provider]  escitalopram (LEXAPRO) 20 MG tablet Take 20 mg by mouth daily.      [provider]  fexofenadine (ALLEGRA) 60 MG tablet 1-2 times daily if needed 01/25/20   Bobbitt, Heywood Iles, MD  fluticasone [facility]) 50 MCG/ACT nasal spray 1-2 sprays per nostril daily if needed 01/25/20   Bobbitt, Heywood Iles, MD  Lactobacillus [facility]OMEN) CAPS Take 1 capsule by mouth daily at 6 (six) AM. 06/02/20   [provider]  lidocaine (LIDODERM) 5 % Place 1 patch onto the skin daily. Remove & Discard patch within 12 hours or as directed by MD 06/20/23   Raspet, Noberto Retort, PA-C  Multiple Vitamin (MULTIVITAMIN) capsule Take 1 capsule by mouth daily. VIT. B,D,E     [provider]  Probiotic Product (PROBIOTIC PO) Take by mouth daily.    [provider]  pyridoxine (B-6) 250 MG tablet Take 250 mg by mouth daily.    [provider]  raloxifene (EVISTA) 60 MG tablet Take 60 mg by mouth daily.    [provider]  vitamin B-12 (CYANOCOBALAMIN) 500 MCG tablet Take 500 mcg by mouth daily.    [provider]  vitamin E 400 UNIT capsule Take 400 Units by mouth daily.    [provider]   Patient's spouse was unable to confirm medication list.   Physical Exam: Vitals:   10/14/23 1315 10/14/23 1425 10/14/23 1500 10/14/23 1612  BP: (!) 90/44 (!) 94/47 (!) 91/56 105/75  Pulse: 78 65 72 67  Resp: 19 16 20 19   Temp:    99.5 F (37.5 C)  TempSrc:    Oral  SpO2: (!) 89% 99% 98% 97%  Weight:      Height:        Constitutional: In no distress. Drowsy. Shivering (stated she was cold), thin  Cardiovascular: Normal rate, regular rhythm. No lower extremity edema  Pulmonary: Non labored breathing on room air, no wheezing or rales.   Abdominal: Soft. Mildly  distended and non tender Musculoskeletal: Normal range of motion.     Neurological: Alert and oriented to person, place only. Drowsy. Would not  carry full conversation  Skin: Skin is warm and dry.   Data Reviewed:   I have reviewed labs and images.     Latest Ref Rng & Units 10/14/2023   10:38 AM 06/17/2023    1:56 PM 06/14/2023   12:50 PM  CBC  WBC 4.0 - 10.5 K/uL 14.1  8.7  10.1   Hemoglobin 12.0 - 15.0 g/dL 9.0  62.9  52.8   Hematocrit 36.0 - 46.0 % 27.4  34.7  41.5   Platelets 150 - 400 K/uL 179  224  259       Latest Ref Rng & Units 10/14/2023   10:38 AM 06/17/2023    1:56 PM 06/14/2023   12:50 PM  BMP  Glucose 70 - 99 mg/dL 413  244  95  BUN 8 - 23 mg/dL 25  15  14    Creatinine 0.44 - 1.00 mg/dL 2.13  0.86  5.78   Sodium 135 - 145 mmol/L 136  135  135   Potassium 3.5 - 5.1 mmol/L 3.3  4.2  3.5   Chloride 98 - 111 mmol/L 104  102  100   CO2 22 - 32 mmol/L 22  27  27    Calcium 8.9 - 10.3 mg/dL 8.1  9.0  9.1    CT head w/o contrast   Assessment and Plan:  #Altered mental status  Patient brought in by EMS after experiencing worsening confusion and changes in her gait.  She was also noted to feel feverish and have worsening of her previously medication controlled urinary incontinence.  Unclear exact etiology of patient's altered mental status, it is likely multifactorial.  Given mechanical fall with trauma to the head directly preceded her decline, suspect that patient's traumatic insult to her brain with underlying vascular dementia is the primary culprit of her presenting symptoms. This then likely lead to decreased oral intake and dehydration which further compounded her confusion. Also considered that urinary tract infection is contributing to her symptoms given she was warm to touch at home and in the ED, had worsening urinary incontinence at home, and was noted to have leukocytosis and pyuria on UA.   Plan: -Continue IV hydration with gentle IV fluids s/p 1.5L  -Continue IV antibiotics with ceftriaxone for possible UTI  -Monitor for fever and downward trend in white blood cell count  #UTI  Patient with  leukocytosis and pyuria. Worsening urinary incontinence and feeling warm.  -Continue ceftriaxone  #Dehydration Patient with poor appetite after her recent fall. Will continue with IV hydration per above.   #AKI  Patient with baseline normal serum creatinine. Admission creatinine 1.38. UA with spec gravity ULN and with hyaline casts.     Latest Ref Rng & Units 10/14/2023   10:38 AM 06/17/2023    1:56 PM 06/14/2023   12:50 PM  BMP  Glucose 70 - 99 mg/dL 469  629  95   BUN 8 - 23 mg/dL 25  15  14    Creatinine 0.44 - 1.00 mg/dL 5.28  4.13  2.44   Sodium 135 - 145 mmol/L 136  135  135   Potassium 3.5 - 5.1 mmol/L 3.3  4.2  3.5   Chloride 98 - 111 mmol/L 104  102  100   CO2 22 - 32 mmol/L 22  27  27    Calcium 8.9 - 10.3 mg/dL 8.1  9.0  9.1   S/p 0.1U of fluid continue with gentle hydration.    #Vascular dementia On namenda and aricept.  -Will hold these for now.   H/o osteoporosis  On raloxifene at home   #H/o urinary incontinence Improved with Myrbetriq  Depression  On lexapro   Tobacco use Since 1970 unclear exact amount daily, recent PCP visit states 1ppd.    Advance Care Planning:   Code Status: Full Code Discussed with patient's husband.   Consults: None   Family Communication: Spoke with husband on the phone  Severity of Illness: The appropriate patient status for this patient is INPATIENT. Inpatient status is judged to be reasonable and necessary in order to provide the required intensity of service to ensure the patient's safety. The patient's presenting symptoms, physical exam findings, and initial radiographic and laboratory data in the context of their chronic comorbidities is felt to place them at high risk for  further clinical deterioration. Furthermore, it is not anticipated that the patient will be medically stable for discharge from the hospital within 2 midnights of admission.   * I certify that at the point of admission it is my clinical judgment that the  patient will require inpatient hospital care spanning beyond 2 midnights from the point of admission due to high intensity of service, high risk for further deterioration and high frequency of surveillance required.*  Author: Marolyn Haller, MD 10/14/2023 5:54 PM  For on call review [email or web address].

## 2023-10-14 NOTE — ED Triage Notes (Signed)
 Pt arrives via [facility] from home for "not acting herself". Per EMS pt had a fall yesterday and hit her head on the garbage can. Husband has concern for UTI as pt has been c/o lower back pain. EMS reports that pt had tactile fever and was given 650 mg Tylenol enroute. Currently pt A+Ox4 and states that she just feels unwell, but unable to elaborate in great detail.

## 2023-10-14 NOTE — Hospital Course (Addendum)
 UTI   Fall on Saturday, fluid responsive.  Feeling feverish at home, shakes at home.   Not able to answer many questions. About history. Attempted to call husband did not get an answer.   Shivering.   A&O x2.

## 2023-10-15 ENCOUNTER — Inpatient Hospital Stay (HOSPITAL_COMMUNITY)

## 2023-10-15 DIAGNOSIS — N3001 Acute cystitis with hematuria: Secondary | ICD-10-CM | POA: Diagnosis not present

## 2023-10-15 LAB — CBC WITH DIFFERENTIAL/PLATELET
Abs Immature Granulocytes: 0.05 10*3/uL (ref 0.00–0.07)
Basophils Absolute: 0 10*3/uL (ref 0.0–0.1)
Basophils Relative: 0 %
Eosinophils Absolute: 0 10*3/uL (ref 0.0–0.5)
Eosinophils Relative: 0 %
HCT: 32.7 % — ABNORMAL LOW (ref 36.0–46.0)
Hemoglobin: 10.5 g/dL — ABNORMAL LOW (ref 12.0–15.0)
Immature Granulocytes: 0 %
Lymphocytes Relative: 5 %
Lymphs Abs: 0.7 10*3/uL (ref 0.7–4.0)
MCH: 32.5 pg (ref 26.0–34.0)
MCHC: 32.1 g/dL (ref 30.0–36.0)
MCV: 101.2 fL — ABNORMAL HIGH (ref 80.0–100.0)
Monocytes Absolute: 0.9 10*3/uL (ref 0.1–1.0)
Monocytes Relative: 7 %
Neutro Abs: 11.6 10*3/uL — ABNORMAL HIGH (ref 1.7–7.7)
Neutrophils Relative %: 88 %
Platelets: 190 10*3/uL (ref 150–400)
RBC: 3.23 MIL/uL — ABNORMAL LOW (ref 3.87–5.11)
RDW: 14 % (ref 11.5–15.5)
WBC: 13.3 10*3/uL — ABNORMAL HIGH (ref 4.0–10.5)
nRBC: 0 % (ref 0.0–0.2)

## 2023-10-15 LAB — BLOOD CULTURE ID PANEL (REFLEXED) - BCID2

## 2023-10-15 LAB — PHOSPHORUS: Phosphorus: 3 mg/dL (ref 2.5–4.6)

## 2023-10-15 LAB — TSH: TSH: 0.819 u[IU]/mL (ref 0.350–4.500)

## 2023-10-15 LAB — COMPREHENSIVE METABOLIC PANEL
ALT: 18 U/L (ref 0–44)
AST: 29 U/L (ref 15–41)
Albumin: 2.8 g/dL — ABNORMAL LOW (ref 3.5–5.0)
Alkaline Phosphatase: 43 U/L (ref 38–126)
Anion gap: 12 (ref 5–15)
BUN: 28 mg/dL — ABNORMAL HIGH (ref 8–23)
CO2: 21 mmol/L — ABNORMAL LOW (ref 22–32)
Calcium: 8.1 mg/dL — ABNORMAL LOW (ref 8.9–10.3)
Chloride: 104 mmol/L (ref 98–111)
Creatinine, Ser: 1.4 mg/dL — ABNORMAL HIGH (ref 0.44–1.00)
GFR, Estimated: 37 mL/min — ABNORMAL LOW (ref >60)
Glucose, Bld: 83 mg/dL (ref 70–99)
Potassium: 3.9 mmol/L (ref 3.5–5.1)
Sodium: 137 mmol/L (ref 135–145)
Total Bilirubin: 0.6 mg/dL (ref 0.0–1.2)
Total Protein: 5.7 g/dL — ABNORMAL LOW (ref 6.5–8.1)

## 2023-10-15 LAB — MAGNESIUM: Magnesium: 1.8 mg/dL (ref 1.7–2.4)

## 2023-10-15 MED ORDER — ESCITALOPRAM OXALATE 20 MG PO TABS
20.0000 mg | ORAL_TABLET | Freq: Every morning | ORAL | Status: DC
  Administered 2023-10-15 – 2023-10-20 (×5): 20 mg via ORAL
  Filled 2023-10-15 (×2): qty 1
  Filled 2023-10-15: qty 2
  Filled 2023-10-15 (×2): qty 1

## 2023-10-15 MED ORDER — POLYVINYL ALCOHOL 1.4 % OP SOLN
1.0000 [drp] | Freq: Every morning | OPHTHALMIC | Status: DC
  Administered 2023-10-15 – 2023-10-20 (×6): 1 [drp] via OPHTHALMIC
  Filled 2023-10-15: qty 15

## 2023-10-15 MED ORDER — DONEPEZIL HCL 10 MG PO TABS
10.0000 mg | ORAL_TABLET | Freq: Every morning | ORAL | Status: DC
  Administered 2023-10-15 – 2023-10-20 (×5): 10 mg via ORAL
  Filled 2023-10-15 (×5): qty 1

## 2023-10-15 MED ORDER — LACTATED RINGERS IV SOLN: INTRAVENOUS | Status: AC

## 2023-10-15 MED ORDER — MEMANTINE HCL 10 MG PO TABS
5.0000 mg | ORAL_TABLET | Freq: Every day | ORAL | Status: DC
  Administered 2023-10-15 – 2023-10-20 (×5): 5 mg via ORAL
  Filled 2023-10-15 (×5): qty 1

## 2023-10-15 MED ORDER — ASPIRIN 81 MG PO TBEC
81.0000 mg | DELAYED_RELEASE_TABLET | Freq: Every day | ORAL | Status: DC
  Administered 2023-10-15 – 2023-10-20 (×4): 81 mg via ORAL
  Filled 2023-10-15 (×3): qty 1

## 2023-10-15 MED ORDER — ALPRAZOLAM 0.25 MG PO TABS
0.2500 mg | ORAL_TABLET | Freq: Every day | ORAL | Status: DC | PRN
  Administered 2023-10-16 – 2023-10-17 (×2): 0.25 mg via ORAL
  Filled 2023-10-15 (×2): qty 1

## 2023-10-15 MED ORDER — VITAMIN B-12 1000 MCG PO TABS
1000.0000 ug | ORAL_TABLET | Freq: Every day | ORAL | Status: DC
Start: 2023-10-15 — End: 2023-10-20
  Administered 2023-10-15 – 2023-10-20 (×5): 1000 ug via ORAL
  Filled 2023-10-15 (×5): qty 1

## 2023-10-15 MED ORDER — MIRABEGRON ER 25 MG PO TB24
25.0000 mg | ORAL_TABLET | Freq: Every morning | ORAL | Status: DC
Start: 2023-10-15 — End: 2023-10-20
  Administered 2023-10-15 – 2023-10-20 (×5): 25 mg via ORAL
  Filled 2023-10-15 (×5): qty 1

## 2023-10-15 NOTE — Plan of Care (Signed)

## 2023-10-15 NOTE — Evaluation (Signed)
 Occupational Therapy Evaluation Patient Details Name: [name] MRN: [id number] DOB: 07-27-1940 Today's Date: 10/15/2023   History of Present Illness   Patient is a 83 year old female who presented on 3/25 with history of 2 day prior fall with hitting head on dumpster and AMS. Patient was admitted with UTI, dehydration and AKI. PMH: vascular dementia, over active bladder, TIA, hearing loss.     Clinical Impressions Patient is a 83 year old female who was admitted for above. Patient was living at home with husband prior level. Currently, patient was +2 for safety with standing in ED with patient having weakness in BLE. Patient noted to have confusion and h/o vascular dementia at baseline. Patient was noted to have decreased functional activity tolerance, decreased endurance, decreased standing balance, decreased safety awareness, and decreased knowledge of AD/AE impacting participation in ADLs. Plan is for patient to d/c home with family support when medically stable.      If plan is discharge home, recommend the following:   A lot of help with bathing/dressing/bathroom;Assistance with cooking/housework;Direct supervision/assist for medications management;Assist for transportation;A lot of help with walking and/or transfers;Direct supervision/assist for financial management;Help with stairs or ramp for entrance     Functional Status Assessment   Patient has had a recent decline in their functional status and demonstrates the ability to make significant improvements in function in a reasonable and predictable amount of time.     Equipment Recommendations   None recommended by OT      Precautions/Restrictions   Precautions Precautions: Fall Restrictions Weight Bearing Restrictions Per Provider Order: No     Mobility Bed Mobility Overal bed mobility: Needs Assistance Bed Mobility: Supine to Sit, Sit to Supine     Supine to sit: Min assist Sit to supine: Mod  assist             Balance Overall balance assessment: Mild deficits observed, not formally tested             ADL either performed or assessed with clinical judgement   ADL Overall ADL's : Needs assistance/impaired Eating/Feeding: Modified independent;Sitting   Grooming: Sitting;Set up   Upper Body Bathing: Sitting;Minimal assistance   Lower Body Bathing: Sitting/lateral leans;Maximal assistance   Upper Body Dressing : Sitting;Minimal assistance   Lower Body Dressing: Sitting/lateral leans;Maximal assistance   Toilet Transfer: +2 for physical assistance;+2 for safety/equipment;Moderate assistance;Stand-pivot Toilet Transfer Details (indicate cue type and reason): to take small steps p HOB with legs "feeling weak" per patient report. Toileting- Clothing Manipulation and Hygiene: Sitting/lateral lean;Maximal assistance Toileting - Clothing Manipulation Details (indicate cue type and reason): incontinent at baseline with adult absorbent undergarment in place. needed to be replaced during session with loose stool noted on old one. hygiene completed             Vision Baseline Vision/History: 1 Wears glasses              Pertinent Vitals/Pain Pain Assessment Pain Assessment: No/denies pain     Extremity/Trunk Assessment Upper Extremity Assessment Upper Extremity Assessment: Overall WFL for tasks assessed              Cognition Arousal: Alert Behavior During Therapy: Flat affect Cognition: No family/caregiver present to determine baseline             OT - Cognition Comments: patient was confused during session. did know she was at a hospital "type place"  Following commands: Impaired Following commands impaired: Follows one step commands with increased time                Home Living Family/patient expects to be discharged to:: Private residence Living Arrangements: Spouse/significant other Available Help at  Discharge: Family                OT Problem List: Decreased activity tolerance;Impaired balance (sitting and/or standing);Decreased coordination;Decreased safety awareness;Decreased knowledge of precautions;Decreased knowledge of use of DME or AE   OT Treatment/Interventions: Self-care/ADL training;Energy conservation;Therapeutic exercise;Patient/family education;Therapeutic activities;DME and/or AE instruction;Balance training      OT Goals(Current goals can be found in the care plan section)   Acute Rehab OT Goals Patient Stated Goal: to go home OT Goal Formulation: Patient unable to participate in goal setting Time For Goal Achievement: 10/29/23 Potential to Achieve Goals: Fair   OT Frequency:  Min 1X/week    Co-evaluation PT/OT/SLP Co-Evaluation/Treatment: Yes Reason for Co-Treatment: Necessary to address cognition/behavior during functional activity PT goals addressed during session: Mobility/safety with mobility OT goals addressed during session: ADL's and self-care      AM-PAC OT "6 Clicks" Daily Activity     Outcome Measure Help from another person eating meals?: A Little Help from another person taking care of personal grooming?: A Little Help from another person toileting, which includes using toliet, bedpan, or urinal?: A Lot Help from another person bathing (including washing, rinsing, drying)?: A Lot Help from another person to put on and taking off regular upper body clothing?: A Little Help from another person to put on and taking off regular lower body clothing?: A Lot 6 Click Score: 15   End of Session Equipment Utilized During Treatment: Gait belt Nurse Communication: Mobility status  Activity Tolerance: Patient tolerated treatment well Patient left: in bed;with call bell/phone within reach (in ED)  OT Visit Diagnosis: Unsteadiness on feet (R26.81);Other abnormalities of gait and mobility (R26.89);History of falling (Z91.81);Muscle weakness  (generalized) (M62.81)                Time: 1610-9604 OT Time Calculation (min): 22 min Charges:  OT General Charges $OT Visit: 1 Visit OT Evaluation $OT Eval Moderate Complexity: 1 Mod  [name] OTR/L, MS Acute Rehabilitation Department Office# [phone]   Selinda Flavin 10/15/2023, 11:56 AM

## 2023-10-15 NOTE — Progress Notes (Signed)
 PHARMACY - PHYSICIAN COMMUNICATION CRITICAL VALUE ALERT - BLOOD CULTURE IDENTIFICATION (BCID)  [name] is an 83 y.o. female who presented to [facility] on 10/14/2023 with a chief complaint of weakness  Assessment:  83 yo F found to be confused due to UTI. Urine cx collected but no result. Blood cx is now showing 1/4 bottles with staph epidermidis.   Name of physician (or Provider) Contacted: Dr. Elvera Lennox  Current antibiotics: Ceftriaxone  Changes to prescribed antibiotics recommended:  Patient is on recommended antibiotics - No changes needed  Results for orders placed or performed during the hospital encounter of 10/14/23  Blood Culture ID Panel (Reflexed) (Collected: 10/14/2023 11:25 AM)  Result Value Ref Range   Enterococcus faecalis NOT DETECTED NOT DETECTED   Enterococcus Faecium NOT DETECTED NOT DETECTED   Listeria monocytogenes NOT DETECTED NOT DETECTED   Staphylococcus species DETECTED (A) NOT DETECTED   Staphylococcus aureus (BCID) NOT DETECTED NOT DETECTED   Staphylococcus epidermidis DETECTED (A) NOT DETECTED   Staphylococcus lugdunensis NOT DETECTED NOT DETECTED   Streptococcus species NOT DETECTED NOT DETECTED   Streptococcus agalactiae NOT DETECTED NOT DETECTED   Streptococcus pneumoniae NOT DETECTED NOT DETECTED   Streptococcus pyogenes NOT DETECTED NOT DETECTED   A.calcoaceticus-baumannii NOT DETECTED NOT DETECTED   Bacteroides fragilis NOT DETECTED NOT DETECTED   Enterobacterales NOT DETECTED NOT DETECTED   Enterobacter cloacae complex NOT DETECTED NOT DETECTED   Escherichia coli NOT DETECTED NOT DETECTED   Klebsiella aerogenes NOT DETECTED NOT DETECTED   Klebsiella oxytoca NOT DETECTED NOT DETECTED   Klebsiella pneumoniae NOT DETECTED NOT DETECTED   Proteus species NOT DETECTED NOT DETECTED   Salmonella species NOT DETECTED NOT DETECTED   Serratia marcescens NOT DETECTED NOT DETECTED   Haemophilus influenzae NOT DETECTED NOT DETECTED   Neisseria  meningitidis NOT DETECTED NOT DETECTED   Pseudomonas aeruginosa NOT DETECTED NOT DETECTED   Stenotrophomonas maltophilia NOT DETECTED NOT DETECTED   Candida albicans NOT DETECTED NOT DETECTED   Candida auris NOT DETECTED NOT DETECTED   Candida glabrata NOT DETECTED NOT DETECTED   Candida krusei NOT DETECTED NOT DETECTED   Candida parapsilosis NOT DETECTED NOT DETECTED   Candida tropicalis NOT DETECTED NOT DETECTED   Cryptococcus neoformans/gattii NOT DETECTED NOT DETECTED   Methicillin resistance mecA/C NOT DETECTED NOT DETECTED   Enzo Bi, PharmD, BCPS, BCIDP Clinical Pharmacist 10/15/2023 2:00 PM

## 2023-10-15 NOTE — Progress Notes (Signed)
 PROGRESS NOTE  DAJANAE BROPHY AVW:[id number] DOB: [birth year] DOA: 10/14/2023 PCP: Gwenyth Bender, FNP   LOS: 1 day   Brief Narrative / Interim history: 83 year old female with dementia, prior TIA, overactive bladder with incontinence comes into the hospital with increased confusion after having a fall in the backyard 2 days prior to admission.  She was out throwing something into the dumpster, and then slipped and fell hit her head.  Recovered initially, however has been having slightly worsened gait and increased confusion, and also felt very weak.  She was also noted to be feverish with a low-grade temp of 99.  She was hypotensive in the ER, found to have AKI and potentially a UTI.  She was placed on antibiotics and admitted to the hospital.  Brain imaging with head CT was unremarkable  Subjective / 24h Interval events: Confused, but tells me that she is here because she is not feeling too good.  Reports that today [birth year] birthday  Assesement and Plan: Principal problem Acute metabolic encephalopathy -with increased confusion and gait changes, also with low-grade temperature at home and worsening of her urinary incontinence.  Encephalopathy is probably multifactorial with baseline dementia, recent mechanical fall with trauma to the head as well as UTI.  Treat with antibiotics, supportive care, PT evaluation pending  Active problems UTI-with leukocytosis, pyuria, worsening urinary incontinence and possible fever.  Started on ceftriaxone, monitor cultures.  White count improving slightly  Acute kidney injury-in the setting of poor p.o. intake after her fall, continue IV fluids and recheck renal function in the morning.  Creatinine not improving a whole lot, increase rate of fluids  Underlying dementia-continue home medications  Macrocytic anemia - no bleeding, hemoglobin stable, continue home B12  Scheduled Meds:  aspirin EC  81 mg Oral Daily   cyanocobalamin  1,000 mcg Oral Daily    donepezil  10 mg Oral q AM   enoxaparin (LOVENOX) injection  30 mg Subcutaneous Q24H   escitalopram  20 mg Oral q AM   memantine  5 mg Oral Daily   mirabegron ER  25 mg Oral q AM   multivitamin with minerals  1 tablet Oral Daily   nicotine  7 mg Transdermal Daily   Polyethylene Glycol 400  1 drop Both Eyes q AM   Continuous Infusions:  cefTRIAXone (ROCEPHIN)  IV     lactated ringers     PRN Meds:.acetaminophen, ALPRAZolam  Current Outpatient Medications  Medication Instructions   ALPRAZolam (XANAX) 0.25 mg, Daily PRN   aspirin EC 81 mg, Daily   BLINK TEARS 0.25 % SOLN 1 drop, Every morning   Calcium-Vitamin D-Vitamin K 650-12.5-40 MG-MCG-MCG CHEW 1 tablet, Daily   cyanocobalamin (VITAMIN B12) 1,000 mcg, Daily   donepezil (ARICEPT) 10 mg, Oral, Every morning   escitalopram (LEXAPRO) 20 mg, Every morning   fexofenadine (ALLEGRA) 60 MG tablet 1-2 times daily if needed   fluticasone (FLONASE) 50 MCG/ACT nasal spray 1-2 sprays per nostril daily if needed   Ibuprofen 200 mg, Oral, Every 6 hours PRN   lidocaine (LIDODERM) 5 % 1 patch, Transdermal, Every 24 hours, Remove & Discard patch within 12 hours or as directed by MD   memantine (NAMENDA) 5 mg, 2 times daily   Multiple Vitamins-Minerals (ONE A DAY WOMEN 50 PLUS PO) 1 tablet, Daily after breakfast   Multiple Vitamins-Minerals (PRESERVISION AREDS 2) CAPS 1 capsule, 2 times daily   Myrbetriq 25 mg, Oral, Every morning   Probiotic Product (PROBIOTIC PO) 1 capsule, Daily  TYLENOL 500 mg, Oral, Every 6 hours PRN    Diet Orders (From admission, onward)     Start     Ordered   10/14/23 1647  Diet regular Room service appropriate? Yes; Fluid consistency: Thin  Diet effective now       Question Answer Comment  Room service appropriate? Yes   Fluid consistency: Thin      10/14/23 1648            DVT prophylaxis: enoxaparin (LOVENOX) injection 30 mg Start: 10/14/23 1730   Lab Results  Component Value Date   PLT 190  10/15/2023      Code Status: Full Code  Family Communication: No family at bedside  Status is: Inpatient Remains inpatient appropriate because: Severity of illness   Level of care: Telemetry  Consultants:  None  Objective: Vitals:   10/15/23 0630 10/15/23 0850 10/15/23 0907 10/15/23 0911  BP: 98/61 100/60    Pulse: 72 80  86  Resp: (!) 29 (!) 33  19  Temp:   97.9 F (36.6 C)   TempSrc:   Oral   SpO2: 98% 94%  93%  Weight:      Height:       No intake or output data in the 24 hours ending 10/15/23 0952 Wt Readings from Last 3 Encounters:  10/14/23 44.5 kg  06/20/23 43 kg  06/17/23 43 kg    Examination:  Constitutional: NAD Eyes: no scleral icterus ENMT: Mucous membranes are moist.  Neck: normal, supple Respiratory: clear to auscultation bilaterally, no wheezing, no crackles. Normal respiratory effort. No accessory muscle use.  Cardiovascular: Regular rate and rhythm, no murmurs / rubs / gallops. No LE edema.  Abdomen: non distended, no tenderness. Bowel sounds positive.  Musculoskeletal: no clubbing / cyanosis.    Data Reviewed: I have independently reviewed following labs and imaging studies   CBC Recent Labs  Lab 10/14/23 1038 10/15/23 0605  WBC 14.1* 13.3*  HGB 9.0* 10.5*  HCT 27.4* 32.7*  PLT 179 190  MCV 100.0 101.2*  MCH 32.8 32.5  MCHC 32.8 32.1  RDW 14.3 14.0  LYMPHSABS  --  0.7  MONOABS  --  0.9  EOSABS  --  0.0  BASOSABS  --  0.0    Recent Labs  Lab 10/14/23 1038 10/14/23 1048 10/14/23 1254 10/15/23 0605 10/15/23 0606  NA 136  --   --  137  --   K 3.3*  --   --  3.9  --   CL 104  --   --  104  --   CO2 22  --   --  21*  --   GLUCOSE 148*  --   --  83  --   BUN 25*  --   --  28*  --   CREATININE 1.38*  --   --  1.40*  --   CALCIUM 8.1*  --   --  8.1*  --   AST 28  --   --  29  --   ALT 15  --   --  18  --   ALKPHOS 43  --   --  43  --   BILITOT 0.5  --   --  0.6  --   ALBUMIN 3.1*  --   --  2.8*  --   MG  --   --   --   1.8  --   LATICACIDVEN  --  1.8 0.5  --   --  TSH  --   --   --   --  0.819    ------------------------------------------------------------------------------------------------------------------ No results for input(s): "CHOL", "HDL", "LDLCALC", "TRIG", "CHOLHDL", "LDLDIRECT" in the last 72 hours.  No results found for: "HGBA1C" ------------------------------------------------------------------------------------------------------------------ Recent Labs    10/15/23 0606  TSH 0.819    Cardiac Enzymes No results for input(s): "CKMB", "TROPONINI", "MYOGLOBIN" in the last 168 hours.  Invalid input(s): "CK" ------------------------------------------------------------------------------------------------------------------ No results found for: "BNP"  CBG: Recent Labs  Lab 10/14/23 1059  GLUCAP 120*    Recent Results (from the past 240 hours)  Culture, blood (routine x 2)     Status: None (Preliminary result)   Collection Time: 10/14/23 11:25 AM   Specimen: BLOOD  Result Value Ref Range Status   Specimen Description   Final    BLOOD LEFT ANTECUBITAL Performed at [facility], [address], [place], [place] [address]    Special Requests   Final    BOTTLES DRAWN AEROBIC AND ANAEROBIC Blood Culture adequate volume Performed at [facility], [address], [place], [place] [address]    Culture   Final    NO GROWTH < 24 HOURS Performed at [facility], [address], [place], [place] [address]    Report Status PENDING  Incomplete  Culture, blood (routine x 2)     Status: None (Preliminary result)   Collection Time: 10/14/23 11:25 AM   Specimen: BLOOD  Result Value Ref Range Status   Specimen Description   Final    BLOOD BLOOD RIGHT ARM Performed at [facility], [address], [place], [place] [address]    Special Requests   Final    BOTTLES DRAWN AEROBIC AND ANAEROBIC Blood Culture adequate  volume Performed at [facility], [address], [place], [place] [address]    Culture   Final    NO GROWTH < 24 HOURS Performed at [facility], [address] [place], [place] [address]    Report Status PENDING  Incomplete     Radiology Studies: Abd 1 View (KUB) Result Date: 10/14/2023 CLINICAL DATA:  25956 Abdominal distension [id number]. EXAM: ABDOMEN - 1 VIEW COMPARISON:  CT scan abdomen and pelvis from 09/05/2016 FINDINGS: The bowel gas pattern is non-obstructive.  No abnormal stool burden. No evidence of pneumoperitoneum, within the limitations of a supine film. No acute osseous abnormalities. There is a well-circumscribed 7 x 9 mm calcification overlying the left renal shadow. There is dystrophic calcification overlying the left side of the pelvis, which corresponds to calcified leiomyomas. Bowel anastomotic sutures noted overlying the right lower quadrant. There are surgical clips in the right upper quadrant, typical of a previous cholecystectomy. The soft tissues are otherwise within normal limits. IMPRESSION: 1. Nonobstructive bowel gas pattern. No abnormal stool burden. 2. There is a 7 x 9 mm calcification overlying the left renal shadow, which may represent a renal calculus. Electronically Signed   By: Jules Schick M.D.   On: 10/14/2023 17:58   DG Chest Portable 1 View Result Date: 10/14/2023 CLINICAL DATA:  Altered mental status. EXAM: PORTABLE CHEST 1 VIEW COMPARISON:  June 20, 2023. FINDINGS: The heart size and mediastinal contours are within normal limits. Both lungs are clear. The visualized skeletal structures are unremarkable. IMPRESSION: No active disease. Electronically Signed   By: Lupita Raider M.D.   On: 10/14/2023 13:50   CT HEAD WO CONTRAST ( ) Result Date: 10/14/2023 CLINICAL DATA:  Provided history: Head  trauma, minor. Additional history provided: Fall yesterday (with head trauma). EXAM: CT HEAD WITHOUT CONTRAST TECHNIQUE: Contiguous  axial images were obtained from the base of the skull through the vertex without intravenous contrast. RADIATION DOSE REDUCTION: This exam was performed according to the departmental dose-optimization program which includes automated exposure control, adjustment of the mA and/or kV according to patient size and/or use of iterative reconstruction technique. COMPARISON:  Brain MRI 01/07/23 FINDINGS: Brain: Generalized cerebral and cerebellar atrophy. Advanced patchy and confluent hypoattenuation within the cerebral white matter, nonspecific but most often secondary to chronic small vessel ischemia. There is no acute intracranial hemorrhage. No demarcated cortical infarct. No extra-axial fluid collection. No evidence of an intracranial mass. No midline shift. Vascular: No hyperdense vessel. Atherosclerotic calcifications. Skull: No calvarial fracture or aggressive osseous lesion. Sinuses/Orbits: No mass or acute finding within the imaged orbits. No significant paranasal sinus disease at the imaged levels. IMPRESSION: 1. No evidence of an acute intracranial abnormality. 2. Advanced cerebral white matter disease, nonspecific but most often secondary to chronic small vessel ischemia. 3. Generalized parenchymal atrophy. Electronically Signed   By: Jackey Loge D.O.   On: 10/14/2023 12:07     Pamella Pert, MD, PhD [facility]  Between 7 am - 7 pm I am available, please contact me via Amion (for emergencies) or Securechat (non urgent messages)  Between 7 pm - 7 am I am not available, please contact night coverage MD/APP via Amion

## 2023-10-15 NOTE — ED Notes (Signed)
 Assumed care of patient. Patient currently resting in bed with no signs of acute distress noted. Waiting on ready hospital bed. No needs expressed at this time by patient.

## 2023-10-15 NOTE — Evaluation (Signed)
 Physical Therapy Evaluation Patient Details Name: [name] MRN: [id number] DOB: January 21, 1941 Today's Date: 10/15/2023  History of Present Illness  Patient is a 83 year old female who presented on 3/25 with history of 2 day prior fall with hitting head on dumpster and AMS. Patient was admitted with UTI, dehydration and AKI. PMH: vascular dementia, over active bladder, TIA, hearing loss.  Clinical Impression  Pt admitted with above diagnosis.  Pt currently with functional limitations due to the deficits listed below (see PT Problem List). Pt will benefit from acute skilled PT to increase their independence and safety with mobility to allow discharge.     The patient is pleasantly confused, no family present. Patient does  present with decreased balance in sitting and standing.   Patient able to mobilize to sitting then stood and sidestepped along stretcher. Patient will benefit from continued inpatient follow up therapy, <3 hours/day.       If plan is discharge home, recommend the following: A lot of help with walking and/or transfers;A little help with bathing/dressing/bathroom;Help with stairs or ramp for entrance;Assistance with cooking/housework;Supervision due to cognitive status   Can travel by private vehicle   No    Equipment Recommendations Rolling walker (2 wheels) (TBA)  Recommendations for Other Services       Functional Status Assessment Patient has had a recent decline in their functional status and demonstrates the ability to make significant improvements in function in a reasonable and predictable amount of time.     Precautions / Restrictions Precautions Precautions: Fall Recall of Precautions/Restrictions: Intact Restrictions Weight Bearing Restrictions Per Provider Order: No      Mobility  Bed Mobility   Bed Mobility: Supine to Sit, Sit to Supine     Supine to sit: Min assist Sit to supine: Mod assist   General bed mobility comments: asdsist with legs  and trunk    Transfers Overall transfer level: Needs assistance Equipment used: 2 person hand held assist Transfers: Sit to/from Stand Sit to Stand: Mod assist, +2 safety/equipment, +2 physical assistance           General transfer comment: able to side step along the bed with HHA    Ambulation/Gait                  Stairs            Wheelchair Mobility     Tilt Bed    Modified Rankin (Stroke Patients Only)       Balance Overall balance assessment: Needs assistance Sitting-balance support: Feet unsupported, Bilateral upper extremity supported Sitting balance-Leahy Scale: Poor Sitting balance - Comments: leans posteriorly, gradually able  gain  self control of balance Postural control: Posterior lean Standing balance support: Bilateral upper extremity supported, During functional activity Standing balance-Leahy Scale: Poor Standing balance comment: relain on HHA                             Pertinent Vitals/Pain Pain Assessment Pain Assessment: Faces    Home Living   Living Arrangements: Spouse/significant other Available Help at Discharge: Family                    Prior Function Prior Level of Function : Independent/Modified Independent             Mobility Comments: patient states that she  has had falls ADLs Comments: unsure     Extremity/Trunk Assessment   Upper Extremity  Assessment Upper Extremity Assessment: Overall WFL for tasks assessed    Lower Extremity Assessment Lower Extremity Assessment: Generalized weakness    Cervical / Trunk Assessment Cervical / Trunk Assessment: Kyphotic  Communication   Communication Communication: No apparent difficulties    Cognition Arousal: Alert Behavior During Therapy: Flat affect                             Following commands: Impaired Following commands impaired: Follows one step commands with increased time     Cueing Cueing Techniques:  Verbal cues, Gestural cues     General Comments      Exercises     Assessment/Plan    PT Assessment Patient needs continued PT services  PT Problem List Decreased strength;Decreased activity tolerance;Decreased mobility;Decreased knowledge of use of DME;Decreased safety awareness;Decreased knowledge of precautions       PT Treatment Interventions DME instruction;Therapeutic exercise;Gait training;Functional mobility training;Cognitive remediation;Therapeutic activities;Patient/family education    PT Goals (Current goals can be found in the Care Plan section)  Acute Rehab PT Goals PT Goal Formulation: Patient unable to participate in goal setting Time For Goal Achievement: 10/29/23 Potential to Achieve Goals: Fair    Frequency Min 2X/week     Co-evaluation PT/OT/SLP Co-Evaluation/Treatment: Yes Reason for Co-Treatment: Necessary to address cognition/behavior during functional activity PT goals addressed during session: Mobility/safety with mobility OT goals addressed during session: ADL's and self-care       AM-PAC PT "6 Clicks" Mobility  Outcome Measure Help needed turning from your back to your side while in a flat bed without using bedrails?: A Lot Help needed moving from lying on your back to sitting on the side of a flat bed without using bedrails?: A Lot Help needed moving to and from a bed to a chair (including a wheelchair)?: A Lot Help needed standing up from a chair using your arms (e.g., wheelchair or bedside chair)?: A Lot Help needed to walk in hospital room?: Total Help needed climbing 3-5 steps with a railing? : Total 6 Click Score: 10    End of Session Equipment Utilized During Treatment: Gait belt Activity Tolerance: Patient tolerated treatment well Patient left: in bed;with call bell/phone within reach Nurse Communication: Mobility status PT Visit Diagnosis: Unsteadiness on feet (R26.81)    Time: 1610-9604 PT Time Calculation (min) (ACUTE ONLY):  27 min   Charges:   PT Evaluation $PT Eval Low Complexity: 1 Low   PT General Charges $$ ACUTE PT VISIT: 1 Visit         Blanchard Kelch PT Acute Rehabilitation Services Office [phone] Weekend [phone]   Rada Hay 10/15/2023, 2:29 PM

## 2023-10-16 DIAGNOSIS — R7881 Bacteremia: Secondary | ICD-10-CM

## 2023-10-16 DIAGNOSIS — N3001 Acute cystitis with hematuria: Secondary | ICD-10-CM | POA: Diagnosis not present

## 2023-10-16 LAB — BLOOD CULTURE ID PANEL (REFLEXED) - BCID2

## 2023-10-16 LAB — COMPREHENSIVE METABOLIC PANEL WITH GFR
ALT: 18 U/L (ref 0–44)
AST: 25 U/L (ref 15–41)
Albumin: 2.4 g/dL — ABNORMAL LOW (ref 3.5–5.0)
Alkaline Phosphatase: 42 U/L (ref 38–126)
Anion gap: 6 (ref 5–15)
BUN: 24 mg/dL — ABNORMAL HIGH (ref 8–23)
CO2: 23 mmol/L (ref 22–32)
Calcium: 7.7 mg/dL — ABNORMAL LOW (ref 8.9–10.3)
Chloride: 102 mmol/L (ref 98–111)
Creatinine, Ser: 1.11 mg/dL — ABNORMAL HIGH (ref 0.44–1.00)
GFR, Estimated: 49 mL/min — ABNORMAL LOW (ref >60)
Glucose, Bld: 111 mg/dL — ABNORMAL HIGH (ref 70–99)
Potassium: 3.6 mmol/L (ref 3.5–5.1)
Sodium: 131 mmol/L — ABNORMAL LOW (ref 135–145)
Total Bilirubin: 0.3 mg/dL (ref 0.0–1.2)
Total Protein: 5.1 g/dL — ABNORMAL LOW (ref 6.5–8.1)

## 2023-10-16 LAB — CBC
HCT: 28.5 % — ABNORMAL LOW (ref 36.0–46.0)
Hemoglobin: 9.3 g/dL — ABNORMAL LOW (ref 12.0–15.0)
MCH: 32.5 pg (ref 26.0–34.0)
MCHC: 32.6 g/dL (ref 30.0–36.0)
MCV: 99.7 fL (ref 80.0–100.0)
Platelets: 200 10*3/uL (ref 150–400)
RBC: 2.86 MIL/uL — ABNORMAL LOW (ref 3.87–5.11)
RDW: 13.7 % (ref 11.5–15.5)
WBC: 9.8 10*3/uL (ref 4.0–10.5)
nRBC: 0 % (ref 0.0–0.2)

## 2023-10-16 LAB — CULTURE, BLOOD (ROUTINE X 2): Special Requests: ADEQUATE

## 2023-10-16 LAB — URINE CULTURE: Culture: 30000 — AB

## 2023-10-16 LAB — MAGNESIUM: Magnesium: 1.8 mg/dL (ref 1.7–2.4)

## 2023-10-16 LAB — PHOSPHORUS: Phosphorus: 1.4 mg/dL — ABNORMAL LOW (ref 2.5–4.6)

## 2023-10-16 MED ORDER — SODIUM CHLORIDE 0.9% FLUSH
3.0000 mL | Freq: Two times a day (BID) | INTRAVENOUS | Status: DC
Start: 2023-10-16 — End: 2023-10-17
  Administered 2023-10-17: 10 mL via INTRAVENOUS

## 2023-10-16 MED ORDER — SODIUM CHLORIDE 0.9% FLUSH: 3.0000 mL | INTRAVENOUS | Status: DC | PRN

## 2023-10-16 MED ORDER — SODIUM CHLORIDE 0.9 % IV SOLN
3.0000 g | Freq: Two times a day (BID) | INTRAVENOUS | Status: DC
  Administered 2023-10-16: 3 g via INTRAVENOUS
  Filled 2023-10-16: qty 8

## 2023-10-16 MED ORDER — SODIUM CHLORIDE 0.9 % IV SOLN
2.0000 g | Freq: Four times a day (QID) | INTRAVENOUS | Status: DC
  Administered 2023-10-16 – 2023-10-20 (×16): 2 g via INTRAVENOUS
  Filled 2023-10-16 (×16): qty 2000

## 2023-10-16 NOTE — Progress Notes (Signed)
 PROGRESS NOTE  [name] VWU:[id number] DOB: 03-24-1941 DOA: 10/14/2023 PCP: Gwenyth Bender, FNP   LOS: 2 days   Brief Narrative / Interim history: 83 year old female with dementia, prior TIA, overactive bladder with incontinence comes into the hospital with increased confusion after having a fall in the backyard 2 days prior to admission.  She was out throwing something into the dumpster, and then slipped and fell hit her head.  Recovered initially, however has been having slightly worsened gait and increased confusion, and also felt very weak.  She was also noted to be feverish with a low-grade temp of 99.  She was hypotensive in the ER, found to have AKI and potentially a UTI.  She was placed on antibiotics and admitted to the hospital.  Brain imaging with head CT was unremarkable  Subjective / 24h Interval events: Feeling well, remains confused, asking about when she can go home.  Assesement and Plan: Principal problem Acute metabolic encephalopathy -with increased confusion and gait changes, also with low-grade temperature at home and worsening of her urinary incontinence.  Encephalopathy is probably multifactorial with baseline dementia, recent mechanical fall with trauma to the head as well as UTI.  Treat with antibiotics, supportive care, PT recommends SNF, will discuss with the husband  Active problems Enterococcus UTI-with leukocytosis, pyuria, worsening urinary incontinence and possible fever.  Started on ceftriaxone, transition to ampicillin today due to pansensitive Enterococcus  Concern for bacteremia -1/4 cultures as well as BC ID shows Staph epidermidis, another 1/4 cultures are showing GPC's, speciation pending.  Hopefully it will not show Enterococcus, continue to monitor.  Obtain surveillance cultures.  Case briefly discussed with Dr. Renold Don with ID  Underlying dementia-continue home medications  Macrocytic anemia - no bleeding, hemoglobin stable, continue home  B12  Scheduled Meds:  aspirin EC  81 mg Oral Daily   cyanocobalamin  1,000 mcg Oral Daily   donepezil  10 mg Oral q AM   enoxaparin (LOVENOX) injection  30 mg Subcutaneous Q24H   escitalopram  20 mg Oral q AM   memantine  5 mg Oral Daily   mirabegron ER  25 mg Oral q AM   multivitamin with minerals  1 tablet Oral Daily   nicotine  7 mg Transdermal Daily   polyvinyl alcohol  1 drop Both Eyes q AM   Continuous Infusions:  ampicillin-sulbactam (UNASYN) IV     PRN Meds:.acetaminophen, ALPRAZolam  Current Outpatient Medications  Medication Instructions   ALPRAZolam (XANAX) 0.25 mg, Daily PRN   aspirin EC 81 mg, Daily   BLINK TEARS 0.25 % SOLN 1 drop, Every morning   Calcium-Vitamin D-Vitamin K 650-12.5-40 MG-MCG-MCG CHEW 1 tablet, Daily   cyanocobalamin (VITAMIN B12) 1,000 mcg, Daily   donepezil (ARICEPT) 10 mg, Oral, Every morning   escitalopram (LEXAPRO) 20 mg, Every morning   fexofenadine (ALLEGRA) 60 MG tablet 1-2 times daily if needed   fluticasone (FLONASE) 50 MCG/ACT nasal spray 1-2 sprays per nostril daily if needed   Ibuprofen 200 mg, Oral, Every 6 hours PRN   lidocaine (LIDODERM) 5 % 1 patch, Transdermal, Every 24 hours, Remove & Discard patch within 12 hours or as directed by MD   memantine (NAMENDA) 5 mg, 2 times daily   Multiple Vitamins-Minerals (ONE A DAY WOMEN 50 PLUS PO) 1 tablet, Daily after breakfast   Multiple Vitamins-Minerals (PRESERVISION AREDS 2) CAPS 1 capsule, 2 times daily   Myrbetriq 25 mg, Oral, Every morning   Probiotic Product (PROBIOTIC PO) 1 capsule, Daily  TYLENOL 500 mg, Oral, Every 6 hours PRN    Diet Orders (From admission, onward)     Start     Ordered   10/14/23 1647  Diet regular Room service appropriate? Yes; Fluid consistency: Thin  Diet effective now       Question Answer Comment  Room service appropriate? Yes   Fluid consistency: Thin      10/14/23 1648            DVT prophylaxis: enoxaparin (LOVENOX) injection 30 mg  Start: 10/14/23 1730   Lab Results  Component Value Date   PLT 200 10/16/2023      Code Status: Full Code  Family Communication: No family at bedside  Status is: Inpatient Remains inpatient appropriate because: Severity of illness   Level of care: Telemetry  Consultants:  None  Objective: Vitals:   10/15/23 1810 10/15/23 2033 10/16/23 0035 10/16/23 0433  BP: (!) 108/47 (!) 101/41 (!) 107/57 (!) 106/56  Pulse: 88 83 77 76  Resp:  18 18 18   Temp: 98.9 F (37.2 C) 100 F (37.8 C) 99.1 F (37.3 C) 99.9 F (37.7 C)  TempSrc: Oral Oral Oral Oral  SpO2: 94% 92% 93% 90%  Weight:      Height:        Intake/Output Summary (Last 24 hours) at 10/16/2023 1001 Last data filed at 10/15/2023 1900 Gross per 24 hour  Intake 1154.22 ml  Output --  Net 1154.22 ml   Wt Readings from Last 3 Encounters:  10/14/23 44.5 kg  06/20/23 43 kg  06/17/23 43 kg    Examination:  Constitutional: NAD Eyes: lids and conjunctivae normal, no scleral icterus ENMT: mmm Neck: normal, supple Respiratory: clear to auscultation bilaterally, no wheezing, no crackles.  Cardiovascular: Regular rate and rhythm, no murmurs / rubs / gallops. No LE edema. Abdomen: soft, no distention, no tenderness. Bowel sounds positive.    Data Reviewed: I have independently reviewed following labs and imaging studies   CBC Recent Labs  Lab 10/14/23 1038 10/15/23 0605 10/16/23 0542  WBC 14.1* 13.3* 9.8  HGB 9.0* 10.5* 9.3*  HCT 27.4* 32.7* 28.5*  PLT 179 190 200  MCV 100.0 101.2* 99.7  MCH 32.8 32.5 32.5  MCHC 32.8 32.1 32.6  RDW 14.3 14.0 13.7  LYMPHSABS  --  0.7  --   MONOABS  --  0.9  --   EOSABS  --  0.0  --   BASOSABS  --  0.0  --     Recent Labs  Lab 10/14/23 1038 10/14/23 1048 10/14/23 1254 10/15/23 0605 10/15/23 0606 10/16/23 0542  NA 136  --   --  137  --  131*  K 3.3*  --   --  3.9  --  3.6  CL 104  --   --  104  --  102  CO2 22  --   --  21*  --  23  GLUCOSE 148*  --   --  83   --  111*  BUN 25*  --   --  28*  --  24*  CREATININE 1.38*  --   --  1.40*  --  1.11*  CALCIUM 8.1*  --   --  8.1*  --  7.7*  AST 28  --   --  29  --  25  ALT 15  --   --  18  --  18  ALKPHOS 43  --   --  43  --  42  BILITOT 0.5  --   --  0.6  --  0.3  ALBUMIN 3.1*  --   --  2.8*  --  2.4*  MG  --   --   --  1.8  --  1.8  LATICACIDVEN  --  1.8 0.5  --   --   --   TSH  --   --   --   --  0.819  --     ------------------------------------------------------------------------------------------------------------------ No results for input(s): "CHOL", "HDL", "LDLCALC", "TRIG", "CHOLHDL", "LDLDIRECT" in the last 72 hours.  No results found for: "HGBA1C" ------------------------------------------------------------------------------------------------------------------ Recent Labs    10/15/23 0606  TSH 0.819    Cardiac Enzymes No results for input(s): "CKMB", "TROPONINI", "MYOGLOBIN" in the last 168 hours.  Invalid input(s): "CK" ------------------------------------------------------------------------------------------------------------------ No results found for: "BNP"  CBG: Recent Labs  Lab 10/14/23 1059  GLUCAP 120*    Recent Results (from the past 240 hours)  Culture, blood (routine x 2)     Status: Abnormal   Collection Time: 10/14/23 11:25 AM   Specimen: BLOOD  Result Value Ref Range Status   Specimen Description   Final    BLOOD LEFT ANTECUBITAL Performed at [facility], [address] [place], [place] [address]    Special Requests   Final    BOTTLES DRAWN AEROBIC AND ANAEROBIC Blood Culture adequate volume Performed at [facility], [address], [place], [place] [address]    Culture  Setup Time   Final    GRAM POSITIVE COCCI IN CLUSTERS AEROBIC BOTTLE ONLY CRITICAL RESULT CALLED TO, READ BACK BY AND VERIFIED WITH: PHARMD NATHAN BATCHELDER ON 10/15/23 @ 1353 BY DRT    Culture (A)  Final    STAPHYLOCOCCUS EPIDERMIDIS THE  SIGNIFICANCE OF ISOLATING THIS ORGANISM FROM A SINGLE SET OF BLOOD CULTURES WHEN MULTIPLE SETS ARE DRAWN IS UNCERTAIN. PLEASE NOTIFY THE MICROBIOLOGY DEPARTMENT WITHIN ONE WEEK IF SPECIATION AND SENSITIVITIES ARE REQUIRED. Performed at [facility], [address] [place], [place] [address]    Report Status 10/16/2023 FINAL  Final  Culture, blood (routine x 2)     Status: None (Preliminary result)   Collection Time: 10/14/23 11:25 AM   Specimen: BLOOD  Result Value Ref Range Status   Specimen Description   Final    BLOOD BLOOD RIGHT ARM Performed at [facility], [address], [place], [place] [address]    Special Requests   Final    BOTTLES DRAWN AEROBIC AND ANAEROBIC Blood Culture adequate volume Performed at [facility], [address] [place], [place] [address]    Culture  Setup Time   Final    GRAM POSITIVE COCCI ANAEROBIC BOTTLE ONLY Performed at [facility], [address], [place], [place] [address]    Culture GRAM POSITIVE COCCI  Final   Report Status PENDING  Incomplete  Blood Culture ID Panel (Reflexed)     Status: Abnormal   Collection Time: 10/14/23 11:25 AM  Result Value Ref Range Status   Enterococcus faecalis NOT DETECTED NOT DETECTED Final   Enterococcus Faecium NOT DETECTED NOT DETECTED Final   Listeria monocytogenes NOT DETECTED NOT DETECTED Final   Staphylococcus species DETECTED (A) NOT DETECTED Final    Comment: CRITICAL RESULT CALLED TO, READ BACK BY AND VERIFIED WITH: PHARMD NATHAN BATCHELDER ON 10/15/23 @ 1353 BY DRT    Staphylococcus aureus (BCID) NOT DETECTED NOT DETECTED Final   Staphylococcus epidermidis DETECTED (A)  NOT DETECTED Final    Comment: CRITICAL RESULT CALLED TO, READ BACK BY AND VERIFIED WITH: PHARMD NATHAN BATCHELDER ON 10/15/23 @ 1353 BY DRT    Staphylococcus lugdunensis NOT DETECTED NOT DETECTED Final   Streptococcus species NOT DETECTED NOT DETECTED Final   Streptococcus  agalactiae NOT DETECTED NOT DETECTED Final   Streptococcus pneumoniae NOT DETECTED NOT DETECTED Final   Streptococcus pyogenes NOT DETECTED NOT DETECTED Final   A.calcoaceticus-baumannii NOT DETECTED NOT DETECTED Final   Bacteroides fragilis NOT DETECTED NOT DETECTED Final   Enterobacterales NOT DETECTED NOT DETECTED Final   Enterobacter cloacae complex NOT DETECTED NOT DETECTED Final   Escherichia coli NOT DETECTED NOT DETECTED Final   Klebsiella aerogenes NOT DETECTED NOT DETECTED Final   Klebsiella oxytoca NOT DETECTED NOT DETECTED Final   Klebsiella pneumoniae NOT DETECTED NOT DETECTED Final   Proteus species NOT DETECTED NOT DETECTED Final   Salmonella species NOT DETECTED NOT DETECTED Final   Serratia marcescens NOT DETECTED NOT DETECTED Final   Haemophilus influenzae NOT DETECTED NOT DETECTED Final   Neisseria meningitidis NOT DETECTED NOT DETECTED Final   Pseudomonas aeruginosa NOT DETECTED NOT DETECTED Final   Stenotrophomonas maltophilia NOT DETECTED NOT DETECTED Final   Candida albicans NOT DETECTED NOT DETECTED Final   Candida auris NOT DETECTED NOT DETECTED Final   Candida glabrata NOT DETECTED NOT DETECTED Final   Candida krusei NOT DETECTED NOT DETECTED Final   Candida parapsilosis NOT DETECTED NOT DETECTED Final   Candida tropicalis NOT DETECTED NOT DETECTED Final   Cryptococcus neoformans/gattii NOT DETECTED NOT DETECTED Final   Methicillin resistance mecA/C NOT DETECTED NOT DETECTED Final    Comment: Performed at [facility], [address], [place], [place] [address]  Urine Culture     Status: Abnormal   Collection Time: 10/14/23 12:00 PM   Specimen: Urine, Random  Result Value Ref Range Status   Specimen Description   Final    URINE, RANDOM Performed at [facility], [address], [place], [place] [address]    Special Requests   Final    NONE Reflexed from [id number] Performed at [facility], [address]  [place], [place] [address]    Culture 30,000 COLONIES/mL ENTEROCOCCUS FAECALIS (A)  Final   Report Status 10/16/2023 FINAL  Final   Organism ID, Bacteria ENTEROCOCCUS FAECALIS (A)  Final      Susceptibility   Enterococcus faecalis - MIC*    AMPICILLIN <=2 SENSITIVE Sensitive     NITROFURANTOIN <=16 SENSITIVE Sensitive     VANCOMYCIN 1 SENSITIVE Sensitive     * 30,000 COLONIES/mL ENTEROCOCCUS FAECALIS     Radiology Studies: US RENAL Result Date: 10/15/2023 CLINICAL DATA:  Nephrolithiasis EXAM: RENAL / URINARY TRACT ULTRASOUND COMPLETE COMPARISON:  CT abdomen and pelvis 09/05/2016 FINDINGS: Right Kidney: Renal measurements: 9.9 x 4 x 4.3 cm = volume: 87 mL. Echogenicity within normal limits. No mass or hydronephrosis visualized. Left Kidney: Renal measurements: 9.9 x 4.7 x 5.6 cm = volume: 136 mL. Normal parenchymal echotexture and thickness. Mild hydronephrosis. No discrete intrarenal stones are demonstrated. Bladder: Appears normal for degree of bladder distention. Other: None. IMPRESSION: 1. Mild hydronephrosis of the left kidney. Etiology is indeterminate. 2. Normal appearance of the right kidney. 3. No discrete intrarenal stones are demonstrated. Electronically Signed   By: Burman Nieves M.D.   On: 10/15/2023 19:50     Pamella Pert, MD, PhD [facility]  Between 7 am - 7 pm I am available,  please contact me via Amion (for emergencies) or Securechat (non urgent messages)  Between 7 pm - 7 am I am not available, please contact night coverage MD/APP via Amion

## 2023-10-16 NOTE — Progress Notes (Signed)
 Pharmacy Antibiotic Note  [name] is a 83 y.o. female with hx vascular dementia, TIA, and overactive bladder admitted on 10/14/2023 with increased confusion s/p a fall. She was started on ceftriaxone on admission for suspected UTI. Ucx on 3/25 resulted back with E. faecalis and Bcx on 3/25 came back with Staph epi and the other bottle with gram-positive cocci in chains (BCID pending). Pharmacy has been consulted on 3/27 to change abx to Unasyn for bacteremia and UTI.  Plan: - Unasyn 3g IV q12h  Height: 4\' 11"  (149.9 cm) Weight: 44.5 kg (98 lb) IBW/kg (Calculated) : 43.2  Temp (24hrs), Avg:99.7 F (37.6 C), Min:97.9 F (36.6 C), Max:102.2 F (39 C)  Recent Labs  Lab 10/14/23 1038 10/14/23 1048 10/14/23 1254 10/15/23 0605 10/16/23 0542  WBC 14.1*  --   --  13.3* 9.8  CREATININE 1.38*  --   --  1.40* 1.11*  LATICACIDVEN  --  1.8 0.5  --   --     Estimated Creatinine Clearance: 25.7 mL/min (A) (by C-G formula based on SCr of 1.11 mg/dL (H)).    Allergies  Allergen Reactions   Sulfa Antibiotics Itching and Rash    Antimicrobials this admission: Ceftriaxone 3/25 >> 3/27 Unasyn 3/27 >>   Dose adjustments this admission: N/A  Microbiology results: 3/25 Bcx x2: 1 bottle with gram-positive cocci in clusters, 1 bottle with gram-positive cocci in chains 3/25 BCID: Staph epi detected 3/25 UCx: 30k Entero Faecalis (pan S)    Thank you for allowing pharmacy to be a part of this patient's care.  Tory Emerald, PharmD Candidate 10/16/2023 9:00 AM

## 2023-10-16 NOTE — TOC Initial Note (Addendum)
 Transition of Care [facility]) - Initial/Assessment Note    Patient Details  Name: [name] MRN: [id number] Date of Birth: 08-18-1940  [facility] of [facility]) CM/SW Contact:    Otelia Santee, LCSW Phone Number: 10/16/2023, 1:49 PM  Clinical Narrative:                 Pt confused and oriented x 2. Spoke with pt's spouse who shares she lives at home with him and is independent at baseline. Pt has not been to SNF in the past. Pt's spouse is agreeable to SNF placement but, unsure that pt will be agreeable. Pt's spouse would like placement at [facility] in [facility] as this is close to where they live. Pt currently not medically stable for discharge.  PASRR has been requested in preparation for SNF placement and is currently under review.   Expected Discharge Plan: Skilled Nursing Facility Barriers to Discharge: Continued Medical Work up   Patient Goals and CMS Choice Patient states their goals for this hospitalization and ongoing recovery are:: For pt to go to rehab          Expected Discharge Plan and Services In-house Referral: Clinical Social Work Discharge Planning Services: NA Post Acute Care Choice: Skilled Nursing Facility Living arrangements for the past 2 months: Single Family Home                                      Prior Living Arrangements/Services Living arrangements for the past 2 months: Single Family Home Lives with:: Spouse Patient language and need for interpreter reviewed:: Yes Do you feel safe going back to the place where you live?: Yes      Need for Family Participation in Patient Care: Yes (Comment) Care giver support system in place?: No (comment) Current home services:  (NA) Criminal Activity/Legal Involvement Pertinent to Current Situation/Hospitalization: No - Comment as needed  Activities of Daily Living      Permission Sought/Granted Permission sought to share information with : Facility Medical sales representative, Family Supports Permission  granted to share information with : Yes, Verbal Permission Granted     Permission granted to share info w AGENCY: SNF's        Emotional Assessment   Attitude/Demeanor/Rapport: Unable to Assess Affect (typically observed): Unable to Assess Orientation: : Oriented to Self Alcohol / Substance Use: Not Applicable Psych Involvement: No (comment)  Admission diagnosis:  UTI (urinary tract infection) [N39.0] Hypotension, unspecified hypotension type [I95.9] Urinary tract infection without hematuria, site unspecified [N39.0] Patient Active Problem List   Diagnosis Date Noted   UTI (urinary tract infection) 10/14/2023   Cerebrovascular disease 02/04/2023   Moderate vascular dementia without behavioral disturbance, psychotic disturbance, mood disturbance, or anxiety (HCC) 02/04/2023   Memory loss 12/30/2022   Unsteady gait 12/30/2022   Atopic dermatitis 02/12/2021   Gastro-esophageal reflux disease without esophagitis 02/12/2021   Gingivitis 02/12/2021   Impairment of balance 02/12/2021   Irritable bowel syndrome with diarrhea 02/12/2021   Major depressive disorder 02/12/2021   Plantar wart of right foot 02/12/2021   Urinary hesitancy 02/12/2021   Chondrodermatitis nodularis helicis of left ear 12/06/2020   Allergic urticaria 01/25/2020   Perennial allergic rhinitis 01/25/2020   Perennial allergic rhinitis 01/25/2020   Hematuria 02/22/2019   Degenerative disc disease, lumbar 04/17/2018   Degenerative scoliosis in adult patient 04/17/2018   SI joint arthritis (HCC) 04/17/2018   Degenerative disc disease, cervical 09/09/2017  Spondylolisthesis of cervical region 09/09/2017   Chronic swimmer's ear of both sides 06/03/2017   Tobacco use 05/06/2017   COPD (chronic obstructive pulmonary disease) (HCC) 05/06/2017   History of renal calculi 01/14/2017   Anxiety 09/05/2016   Renal stone 09/05/2016   Hoarseness 06/20/2016   Laryngopharyngeal reflux (LPR) 06/20/2016   PCP:  Gwenyth Bender, FNP Pharmacy:   [facility] - [place], [place] - [address] [place] [address] Phone: [phone] Fax: [phone]     Social Drivers of Health (SDOH) Social History: SDOH Screenings   Food Insecurity: Low Risk  (09/25/2023)   Received from [facility]  Housing: Low Risk  (09/25/2023)   Received from [facility]  Transportation Needs: No Transportation Needs (09/25/2023)   Received from [facility]  Utilities: Low Risk  (09/25/2023)   Received from [facility]  Tobacco Use: High Risk (09/25/2023)   Received from [facility]   SDOH Interventions:     Readmission Risk Interventions    10/16/2023    1:47 PM  Readmission Risk Prevention Plan  Transportation Screening Complete  PCP or Specialist Appt within 5-7 Days Complete  Home Care Screening Complete  Medication Review (RN CM) Complete

## 2023-10-16 NOTE — Progress Notes (Signed)
 PHARMACY - PHYSICIAN COMMUNICATION CRITICAL VALUE ALERT - BLOOD CULTURE IDENTIFICATION (BCID)  [name] is an 83 y.o. female who presented to [facility] on 10/14/2023 for evaluation s/p fall.  Ucx on 10/14/23 has E faecalis. Bcx now has Staph epi and E faecalis.     Name of physician (or Provider) Contacted: Dr. Elvera Lennox  Current antibiotics: unasyn changed to ampicillin per ID team  Changes to prescribed antibiotics recommended:  - ID team is managing pt's abx  Results for orders placed or performed during the hospital encounter of 10/14/23  Blood Culture ID Panel (Reflexed) (Collected: 10/14/2023 11:25 AM)  Result Value Ref Range   Enterococcus faecalis DETECTED (A) NOT DETECTED   Enterococcus Faecium NOT DETECTED NOT DETECTED   Listeria monocytogenes NOT DETECTED NOT DETECTED   Staphylococcus species NOT DETECTED NOT DETECTED   Staphylococcus aureus (BCID) NOT DETECTED NOT DETECTED   Staphylococcus epidermidis NOT DETECTED NOT DETECTED   Staphylococcus lugdunensis NOT DETECTED NOT DETECTED   Streptococcus species NOT DETECTED NOT DETECTED   Streptococcus agalactiae NOT DETECTED NOT DETECTED   Streptococcus pneumoniae NOT DETECTED NOT DETECTED   Streptococcus pyogenes NOT DETECTED NOT DETECTED   A.calcoaceticus-baumannii NOT DETECTED NOT DETECTED   Bacteroides fragilis NOT DETECTED NOT DETECTED   Enterobacterales NOT DETECTED NOT DETECTED   Enterobacter cloacae complex NOT DETECTED NOT DETECTED   Escherichia coli NOT DETECTED NOT DETECTED   Klebsiella aerogenes NOT DETECTED NOT DETECTED   Klebsiella oxytoca NOT DETECTED NOT DETECTED   Klebsiella pneumoniae NOT DETECTED NOT DETECTED   Proteus species NOT DETECTED NOT DETECTED   Salmonella species NOT DETECTED NOT DETECTED   Serratia marcescens NOT DETECTED NOT DETECTED   Haemophilus influenzae NOT DETECTED NOT DETECTED   Neisseria meningitidis NOT DETECTED NOT DETECTED   Pseudomonas aeruginosa NOT DETECTED NOT DETECTED    Stenotrophomonas maltophilia NOT DETECTED NOT DETECTED   Candida albicans NOT DETECTED NOT DETECTED   Candida auris NOT DETECTED NOT DETECTED   Candida glabrata NOT DETECTED NOT DETECTED   Candida krusei NOT DETECTED NOT DETECTED   Candida parapsilosis NOT DETECTED NOT DETECTED   Candida tropicalis NOT DETECTED NOT DETECTED   Cryptococcus neoformans/gattii NOT DETECTED NOT DETECTED   Vancomycin resistance NOT DETECTED NOT DETECTED    Lucia Gaskins 10/16/2023  3:43 PM

## 2023-10-16 NOTE — Consult Note (Signed)
 [facility]    Date of Admission:  10/14/2023     Reason for Consult: e faecalis community acquired bsi    Referring Provider: Elvera Lennox     Lines:  Peripheral iv's  Abx: 3/27 ampicillin  3/25-26 ceftriaxone        Assessment: 83 yo female vascular dementia, overactive bladder, hx tia, hearing loss, admitted 3/25 after a ?mechanical fall 2 days prior to admission and since being unsteady, ams/trembling the day of admission, found to have e faecalis bacteremia  3/25 bcx gpc clusters (bcid staph epi and e faecalis) 3/25 ucx 30k e faecalis 3/27 bcx in progress  Community acquired e faecalis always worry some.  She doesn't appear to have a robust immunologic response further clouding assessment  In these cases given age and e faecalis an IE pathogen would pursue endocarditis w/u. No peripheral stigmata of sbe so far  Staph epi suspect contaminant  Plan: F/u repeat bcx Narrow abx to ampicillin Tte Will ask cards to do TEE Standard isolation precaution Discussed with dr Elvera Lennox      ------------------------------------------------ Principal Problem:   UTI (urinary tract infection)    HPI: [name] is a 83 y.o. female  vascular dementia, overactive bladder, hx tia, hearing loss, admitted 3/25 after a ?mechanical fall 2 days prior to admission and since being unsteady, ams/trembling the day of admission, found to have e faecalis bacteremia   Hx via chart and discussion with primary team. I did speak with husband to corroborate Neither husband or wife know how she has been feeling if worse than baseline for how long until the fall   Patient was at her ?normal state of health until a few days prior to admission where she had a glf working in the yard She subsequently becomes altered and weak and trembling on day of admission  On presentation had fever Bcx e faecalis and staph epi Urine cx only 20k cfu of e faecalis  Not  complinging of uti sx  On abx and switched to appropriate tx 3/27 today Initial wbc 13 normalized and fever had resolved for 24 hours  She doesn't appear to be expressing any particular distress and wants to go home  She does say she feels "yucky" still    Family History  Problem Relation Age of Onset   Cancer Brother    Diabetes Brother    Heart disease Brother    Cancer Sister    Diabetes Sister    Breast cancer Neg Hx     Social History   Tobacco Use   Smoking status: Every Day    Current packs/day: 1.00    Average packs/day: 1 pack/day for 50.0 years (50.0 ttl pk-yrs)    Types: Cigarettes   Smokeless tobacco: Never  Vaping Use   Vaping status: Never Used  Substance Use Topics   Alcohol use: No   Drug use: No    Allergies  Allergen Reactions   Sulfa Antibiotics Itching and Rash    Review of Systems: ROS All Other ROS was negative, except mentioned above   No past medical history on file.     Scheduled Meds:  aspirin EC  81 mg Oral Daily   cyanocobalamin  1,000 mcg Oral Daily   donepezil  10 mg Oral q AM   enoxaparin (LOVENOX) injection  30 mg Subcutaneous Q24H   escitalopram  20 mg Oral q AM   memantine  5 mg  Oral Daily   mirabegron ER  25 mg Oral q AM   multivitamin with minerals  1 tablet Oral Daily   nicotine  7 mg Transdermal Daily   polyvinyl alcohol  1 drop Both Eyes q AM   Continuous Infusions:  ampicillin (OMNIPEN) IV     PRN Meds:.acetaminophen, ALPRAZolam   OBJECTIVE: Blood pressure (!) 113/56, pulse 77, temperature 97.7 F (36.5 C), temperature source Oral, resp. rate 18, height 4\' 11"  (1.499 m), weight 44.5 kg, SpO2 (!) 87%.  Physical Exam  General/constitutional: no distress, pleasant, verbal answering a few words, but no complex conversation was carried out. Brother in law/sister by bedside; spoke with her husband via phone HEENT: Normocephalic, PER, Conj Clear, EOMI, Oropharynx clear Neck supple CV: rrr no mrg Lungs:  clear to auscultation, normal respiratory effort Abd: Soft, Nontender Ext: no edema Skin: No Rash Neuro: nonfocal MSK: no peripheral joint swelling/tenderness/warmth; back spines nontender    Lab Results Lab Results  Component Value Date   WBC 9.8 10/16/2023   HGB 9.3 (L) 10/16/2023   HCT 28.5 (L) 10/16/2023   MCV 99.7 10/16/2023   PLT 200 10/16/2023    Lab Results  Component Value Date   CREATININE 1.11 (H) 10/16/2023   BUN 24 (H) 10/16/2023   NA 131 (L) 10/16/2023   K 3.6 10/16/2023   CL 102 10/16/2023   CO2 23 10/16/2023    Lab Results  Component Value Date   ALT 18 10/16/2023   AST 25 10/16/2023   ALKPHOS 42 10/16/2023   BILITOT 0.3 10/16/2023      Microbiology: Recent Results (from the past 240 hours)  Culture, blood (routine x 2)     Status: Abnormal   Collection Time: 10/14/23 11:25 AM   Specimen: BLOOD  Result Value Ref Range Status   Specimen Description   Final    BLOOD LEFT ANTECUBITAL Performed at [facility], [address], [place], [place] [address]    Special Requests   Final    BOTTLES DRAWN AEROBIC AND ANAEROBIC Blood Culture adequate volume Performed at [facility], [address] [place], [place] [address]    Culture  Setup Time   Final    GRAM POSITIVE COCCI IN CLUSTERS AEROBIC BOTTLE ONLY CRITICAL RESULT CALLED TO, READ BACK BY AND VERIFIED WITH: PHARMD NATHAN BATCHELDER ON 10/15/23 @ 1353 BY DRT    Culture (A)  Final    STAPHYLOCOCCUS EPIDERMIDIS THE SIGNIFICANCE OF ISOLATING THIS ORGANISM FROM A SINGLE SET OF BLOOD CULTURES WHEN MULTIPLE SETS ARE DRAWN IS UNCERTAIN. PLEASE NOTIFY THE MICROBIOLOGY DEPARTMENT WITHIN ONE WEEK IF SPECIATION AND SENSITIVITIES ARE REQUIRED. Performed at [facility], [address] [place], [place] [address]    Report Status 10/16/2023 FINAL  Final  Culture, blood (routine x 2)     Status: None (Preliminary result)   Collection Time: 10/14/23 11:25 AM    Specimen: BLOOD  Result Value Ref Range Status   Specimen Description   Final    BLOOD BLOOD RIGHT ARM Performed at [facility], [address] [place], [place] [address]    Special Requests   Final    BOTTLES DRAWN AEROBIC AND ANAEROBIC Blood Culture adequate volume Performed at [facility], [address] [place], [place] [address]    Culture  Setup Time   Final    GRAM POSITIVE COCCI IN CHAINS ANAEROBIC BOTTLE ONLY CRITICAL RESULT CALLED TO, READ BACK BY AND VERIFIED WITH:  PHARMD JUSTIN LEGGE ON 10/16/23 @ 1336 BY DRT Performed at [facility], [address] [place], [place] [address]    Culture GRAM POSITIVE COCCI  Final   Report Status PENDING  Incomplete  Blood Culture ID Panel (Reflexed)     Status: Abnormal   Collection Time: 10/14/23 11:25 AM  Result Value Ref Range Status   Enterococcus faecalis NOT DETECTED NOT DETECTED Final   Enterococcus Faecium NOT DETECTED NOT DETECTED Final   Listeria monocytogenes NOT DETECTED NOT DETECTED Final   Staphylococcus species DETECTED (A) NOT DETECTED Final    Comment: CRITICAL RESULT CALLED TO, READ BACK BY AND VERIFIED WITH: PHARMD NATHAN BATCHELDER ON 10/15/23 @ 1353 BY DRT    Staphylococcus aureus (BCID) NOT DETECTED NOT DETECTED Final   Staphylococcus epidermidis DETECTED (A) NOT DETECTED Final    Comment: CRITICAL RESULT CALLED TO, READ BACK BY AND VERIFIED WITH: PHARMD NATHAN BATCHELDER ON 10/15/23 @ 1353 BY DRT    Staphylococcus lugdunensis NOT DETECTED NOT DETECTED Final   Streptococcus species NOT DETECTED NOT DETECTED Final   Streptococcus agalactiae NOT DETECTED NOT DETECTED Final   Streptococcus pneumoniae NOT DETECTED NOT DETECTED Final   Streptococcus pyogenes NOT DETECTED NOT DETECTED Final   A.calcoaceticus-baumannii NOT DETECTED NOT DETECTED Final   Bacteroides fragilis NOT DETECTED NOT DETECTED Final   Enterobacterales NOT DETECTED NOT DETECTED Final   Enterobacter  cloacae complex NOT DETECTED NOT DETECTED Final   Escherichia coli NOT DETECTED NOT DETECTED Final   Klebsiella aerogenes NOT DETECTED NOT DETECTED Final   Klebsiella oxytoca NOT DETECTED NOT DETECTED Final   Klebsiella pneumoniae NOT DETECTED NOT DETECTED Final   Proteus species NOT DETECTED NOT DETECTED Final   Salmonella species NOT DETECTED NOT DETECTED Final   Serratia marcescens NOT DETECTED NOT DETECTED Final   Haemophilus influenzae NOT DETECTED NOT DETECTED Final   Neisseria meningitidis NOT DETECTED NOT DETECTED Final   Pseudomonas aeruginosa NOT DETECTED NOT DETECTED Final   Stenotrophomonas maltophilia NOT DETECTED NOT DETECTED Final   Candida albicans NOT DETECTED NOT DETECTED Final   Candida auris NOT DETECTED NOT DETECTED Final   Candida glabrata NOT DETECTED NOT DETECTED Final   Candida krusei NOT DETECTED NOT DETECTED Final   Candida parapsilosis NOT DETECTED NOT DETECTED Final   Candida tropicalis NOT DETECTED NOT DETECTED Final   Cryptococcus neoformans/gattii NOT DETECTED NOT DETECTED Final   Methicillin resistance mecA/C NOT DETECTED NOT DETECTED Final    Comment: Performed at [facility], [address], [place], [place] [address]  Blood Culture ID Panel (Reflexed)     Status: Abnormal   Collection Time: 10/14/23 11:25 AM  Result Value Ref Range Status   Enterococcus faecalis DETECTED (A) NOT DETECTED Final    Comment: CRITICAL RESULT CALLED TO, READ BACK BY AND VERIFIED WITH: PHARMD JUSTIN LEGGE ON 10/16/23 @ 1336 BY DRT    Enterococcus Faecium NOT DETECTED NOT DETECTED Final   Listeria monocytogenes NOT DETECTED NOT DETECTED Final   Staphylococcus species NOT DETECTED NOT DETECTED Final   Staphylococcus aureus (BCID) NOT DETECTED NOT DETECTED Final   Staphylococcus epidermidis NOT DETECTED NOT DETECTED Final   Staphylococcus lugdunensis NOT DETECTED NOT DETECTED Final   Streptococcus species NOT DETECTED NOT DETECTED Final   Streptococcus agalactiae  NOT DETECTED NOT DETECTED Final   Streptococcus pneumoniae NOT DETECTED NOT DETECTED Final   Streptococcus pyogenes NOT DETECTED NOT DETECTED Final   A.calcoaceticus-baumannii NOT DETECTED NOT DETECTED Final   Bacteroides fragilis NOT DETECTED  NOT DETECTED Final   Enterobacterales NOT DETECTED NOT DETECTED Final   Enterobacter cloacae complex NOT DETECTED NOT DETECTED Final   Escherichia coli NOT DETECTED NOT DETECTED Final   Klebsiella aerogenes NOT DETECTED NOT DETECTED Final   Klebsiella oxytoca NOT DETECTED NOT DETECTED Final   Klebsiella pneumoniae NOT DETECTED NOT DETECTED Final   Proteus species NOT DETECTED NOT DETECTED Final   Salmonella species NOT DETECTED NOT DETECTED Final   Serratia marcescens NOT DETECTED NOT DETECTED Final   Haemophilus influenzae NOT DETECTED NOT DETECTED Final   Neisseria meningitidis NOT DETECTED NOT DETECTED Final   Pseudomonas aeruginosa NOT DETECTED NOT DETECTED Final   Stenotrophomonas maltophilia NOT DETECTED NOT DETECTED Final   Candida albicans NOT DETECTED NOT DETECTED Final   Candida auris NOT DETECTED NOT DETECTED Final   Candida glabrata NOT DETECTED NOT DETECTED Final   Candida krusei NOT DETECTED NOT DETECTED Final   Candida parapsilosis NOT DETECTED NOT DETECTED Final   Candida tropicalis NOT DETECTED NOT DETECTED Final   Cryptococcus neoformans/gattii NOT DETECTED NOT DETECTED Final   Vancomycin resistance NOT DETECTED NOT DETECTED Final    Comment: Performed at [facility], [address] [place], [place] [address]  Urine Culture     Status: Abnormal   Collection Time: 10/14/23 12:00 PM   Specimen: Urine, Random  Result Value Ref Range Status   Specimen Description   Final    URINE, RANDOM Performed at [facility], [address], [place], [place] [address]    Special Requests   Final    NONE Reflexed from [phone] Performed at [facility], [address], [place], [place]  [address]    Culture 30,000 COLONIES/mL ENTEROCOCCUS FAECALIS (A)  Final   Report Status 10/16/2023 FINAL  Final   Organism ID, Bacteria ENTEROCOCCUS FAECALIS (A)  Final      Susceptibility   Enterococcus faecalis - MIC*    AMPICILLIN <=2 SENSITIVE Sensitive     NITROFURANTOIN <=16 SENSITIVE Sensitive     VANCOMYCIN 1 SENSITIVE Sensitive     * 30,000 COLONIES/mL ENTEROCOCCUS FAECALIS  Culture, blood (Routine X 2) w Reflex to ID Panel     Status: None (Preliminary result)   Collection Time: 10/16/23  9:18 AM   Specimen: BLOOD RIGHT ARM  Result Value Ref Range Status   Specimen Description   Final    BLOOD RIGHT ARM Performed at [facility], [address] [place], [place] [address]    Special Requests   Final    BOTTLES DRAWN AEROBIC ONLY Blood Culture results may not be optimal due to an inadequate volume of blood received in culture bottles Performed at [facility], [address], [place], [place] [address]    Culture PENDING  Incomplete   Report Status PENDING  Incomplete  Culture, blood (Routine X 2) w Reflex to ID Panel     Status: None (Preliminary result)   Collection Time: 10/16/23  9:23 AM   Specimen: BLOOD RIGHT HAND  Result Value Ref Range Status   Specimen Description   Final    BLOOD RIGHT HAND Performed at [facility], [address], [place], [place] [address]    Special Requests   Final    BOTTLES DRAWN AEROBIC ONLY Blood Culture results may not be optimal due to an inadequate volume of blood received in culture bottles Performed at [facility], [address] [place], [place] [address]  Culture PENDING  Incomplete   Report Status PENDING  Incomplete     Serology:    Imaging: If present, new imagings (plain films, ct scans, and mri) have been personally visualized and interpreted; radiology reports have been reviewed. Decision making incorporated into the Impression / Recommendations.  3/26 renal  u/s 1. Mild hydronephrosis of the left kidney. Etiology is indeterminate. 2. Normal appearance of the right kidney. 3. No discrete intrarenal stones are demonstrated.   3/25 abd xray 1. Nonobstructive bowel gas pattern. No abnormal stool burden. 2. There is a 7 x 9 mm calcification overlying the left renal shadow, which may represent a renal calculus.   3/25 cxr No active disease   3/25 ct head 1. No evidence of an acute intracranial abnormality. 2. Advanced cerebral white matter disease, nonspecific but most often secondary to chronic small vessel ischemia. 3. Generalized parenchymal atrophy.  Raymondo Band, MD [facility] [phone] pager    10/16/2023, 3:23 PM

## 2023-10-16 NOTE — TOC PASRR Note (Signed)
 30 Day PASRR Note   Patient Details  Name: [name] Date of Birth: 12/03/1940   Transition of Care [facility]) CM/SW Contact:    Otelia Santee, LCSW Phone Number: 10/16/2023, 2:00 PM  To Whom It May Concern:  Please be advised that this patient will require a short-term nursing home stay - anticipated 30 days or less for rehabilitation and strengthening.   The plan is for return home.

## 2023-10-16 NOTE — NC FL2 (Signed)
 [place] MEDICAID FL2 LEVEL OF CARE FORM     IDENTIFICATION  Patient Name: [name] Birthdate: April 26, 1941 Sex: female Admission Date (Current Location): 10/14/2023  [facility] and [place] Number:  Producer, television/film/video and Address:  [facility],  [address] [place], [place] [address]      Provider Number: [id number]  Attending Physician Name and Address:  Leatha Gilding, MD  Relative Name and Phone Number:  Marylynne, Keelin (Spouse)  [phone]    Current Level of Care: Hospital Recommended Level of Care: Skilled Nursing Facility Prior Approval Number:    Date Approved/Denied:   PASRR Number: Pending  Discharge Plan: SNF    Current Diagnoses: Patient Active Problem List   Diagnosis Date Noted   UTI (urinary tract infection) 10/14/2023   Cerebrovascular disease 02/04/2023   Moderate vascular dementia without behavioral disturbance, psychotic disturbance, mood disturbance, or anxiety (HCC) 02/04/2023   Memory loss 12/30/2022   Unsteady gait 12/30/2022   Atopic dermatitis 02/12/2021   Gastro-esophageal reflux disease without esophagitis 02/12/2021   Gingivitis 02/12/2021   Impairment of balance 02/12/2021   Irritable bowel syndrome with diarrhea 02/12/2021   Major depressive disorder 02/12/2021   Plantar wart of right foot 02/12/2021   Urinary hesitancy 02/12/2021   Chondrodermatitis nodularis helicis of left ear 12/06/2020   Allergic urticaria 01/25/2020   Perennial allergic rhinitis 01/25/2020   Perennial allergic rhinitis 01/25/2020   Hematuria 02/22/2019   Degenerative disc disease, lumbar 04/17/2018   Degenerative scoliosis in adult patient 04/17/2018   SI joint arthritis (HCC) 04/17/2018   Degenerative disc disease, cervical 09/09/2017   Spondylolisthesis of cervical region 09/09/2017   Chronic swimmer's ear of both sides 06/03/2017   Tobacco use 05/06/2017   COPD (chronic obstructive pulmonary disease) (HCC) 05/06/2017   History of renal  calculi 01/14/2017   Anxiety 09/05/2016   Renal stone 09/05/2016   Hoarseness 06/20/2016   Laryngopharyngeal reflux (LPR) 06/20/2016    Orientation RESPIRATION BLADDER Height & Weight     Self  Normal Incontinent Weight: 98 lb (44.5 kg) Height:  4\' 11"  (149.9 cm)  BEHAVIORAL SYMPTOMS/MOOD NEUROLOGICAL BOWEL NUTRITION STATUS      Continent Diet (See discharge summary)  AMBULATORY STATUS COMMUNICATION OF NEEDS Skin   Extensive Assist Verbally Normal                       Personal Care Assistance Level of Assistance  Bathing, Feeding, Dressing Bathing Assistance: Maximum assistance Feeding assistance: Independent Dressing Assistance: Maximum assistance     Functional Limitations Info  Hearing, Speech, Sight Sight Info: Impaired Hearing Info: Adequate Speech Info: Adequate    SPECIAL CARE FACTORS FREQUENCY  PT (By licensed PT), OT (By licensed OT)     PT Frequency: 5x/wk OT Frequency: 5x/wk            Contractures Contractures Info: Not present    Additional Factors Info  Code Status, Allergies, Psychotropic Code Status Info: FULL Allergies Info: Sulfa Antibiotics Psychotropic Info: See MAR         Current Medications (10/16/2023):  This is the current hospital active medication list Current Facility-Administered Medications  Medication Dose Route Frequency Provider Last Rate Last Admin   acetaminophen (TYLENOL) tablet 650 mg  650 mg Oral Q6H PRN Marolyn Haller, MD   650 mg at 10/15/23 2053   ALPRAZolam Prudy Feeler) tablet 0.25 mg  0.25 mg Oral Daily PRN Leatha Gilding, MD   0.25 mg at 10/16/23 [phone]  Ampicillin-Sulbactam (UNASYN) 3 g in sodium chloride 0.9 % 100 mL IVPB  3 g Intravenous Q12H Pham, Anh P, RPH 200 mL/hr at 10/16/23 1021 3 g at 10/16/23 1021   aspirin EC tablet 81 mg  81 mg Oral Daily Leatha Gilding, MD   81 mg at 10/16/23 6045   cyanocobalamin (VITAMIN B12) tablet 1,000 mcg  1,000 mcg Oral Daily Leatha Gilding, MD   1,000 mcg at  10/16/23 4098   donepezil (ARICEPT) tablet 10 mg  10 mg Oral q AM Leatha Gilding, MD   10 mg at 10/16/23 0920   enoxaparin (LOVENOX) injection 30 mg  30 mg Subcutaneous Q24H Marolyn Haller, MD   30 mg at 10/15/23 1721   escitalopram (LEXAPRO) tablet 20 mg  20 mg Oral q AM Leatha Gilding, MD   20 mg at 10/16/23 0920   memantine (NAMENDA) tablet 5 mg  5 mg Oral Daily Leatha Gilding, MD   5 mg at 10/16/23 1191   mirabegron ER (MYRBETRIQ) tablet 25 mg  25 mg Oral q AM Leatha Gilding, MD   25 mg at 10/16/23 4782   multivitamin with minerals tablet 1 tablet  1 tablet Oral Daily Marolyn Haller, MD   1 tablet at 10/16/23 9562   nicotine (NICODERM CQ - dosed in mg/24 hr) patch 7 mg  7 mg Transdermal Daily Marolyn Haller, MD   7 mg at 10/16/23 1308   polyvinyl alcohol (LIQUIFILM TEARS) 1.4 % ophthalmic solution 1 drop  1 drop Both Eyes q AM Leatha Gilding, MD   1 drop at 10/16/23 6578     Discharge Medications: Please see discharge summary for a list of discharge medications.  Relevant Imaging Results:  Relevant Lab Results:   Additional Information SSN: [id number]  Otelia Santee, LCSW

## 2023-10-16 NOTE — Progress Notes (Signed)
   [facility] has been requested to perform a transesophageal echocardiogram on [name] for bacteremia.  After careful review of history and examination, the risks and benefits of transesophageal echocardiogram have been explained including risks of esophageal damage, perforation (1:10,000 risk), bleeding, pharyngeal hematoma as well as other potential complications associated with anesthesia including aspiration, arrhythmia, respiratory failure and death. Alternatives to treatment were discussed, questions were answered. Patient's husband is willing consent for her to proceed. Patient has severe dementia and cannot consent for herself.   83 yo female presented with bacteremia. SBP borderline low earlier, but improved. Hgb 9.3, platelet 200.   [place], [place] 10/16/2023 3:55 PM

## 2023-10-17 ENCOUNTER — Inpatient Hospital Stay (HOSPITAL_COMMUNITY)

## 2023-10-17 ENCOUNTER — Encounter (HOSPITAL_COMMUNITY)
Admission: EM | Disposition: A | Discharge status: Skilled Nursing Facility | Payer: Self-pay | Source: Home / Self Care | Attending: Internal Medicine

## 2023-10-17 ENCOUNTER — Inpatient Hospital Stay (HOSPITAL_COMMUNITY): Admitting: Anesthesiology

## 2023-10-17 ENCOUNTER — Encounter (HOSPITAL_COMMUNITY): Payer: Self-pay | Admitting: Student

## 2023-10-17 DIAGNOSIS — J449 Chronic obstructive pulmonary disease, unspecified: Secondary | ICD-10-CM

## 2023-10-17 DIAGNOSIS — I340 Nonrheumatic mitral (valve) insufficiency: Secondary | ICD-10-CM | POA: Diagnosis not present

## 2023-10-17 DIAGNOSIS — F418 Other specified anxiety disorders: Secondary | ICD-10-CM

## 2023-10-17 DIAGNOSIS — F17210 Nicotine dependence, cigarettes, uncomplicated: Secondary | ICD-10-CM

## 2023-10-17 DIAGNOSIS — R7881 Bacteremia: Secondary | ICD-10-CM | POA: Diagnosis not present

## 2023-10-17 DIAGNOSIS — N3001 Acute cystitis with hematuria: Secondary | ICD-10-CM | POA: Diagnosis not present

## 2023-10-17 HISTORY — PX: TRANSESOPHAGEAL ECHOCARDIOGRAM (CATH LAB): EP1270

## 2023-10-17 LAB — CBC
HCT: 28.4 % — ABNORMAL LOW (ref 36.0–46.0)
Hemoglobin: 9.5 g/dL — ABNORMAL LOW (ref 12.0–15.0)
MCH: 33.2 pg (ref 26.0–34.0)
MCHC: 33.5 g/dL (ref 30.0–36.0)
MCV: 99.3 fL (ref 80.0–100.0)
Platelets: 226 10*3/uL (ref 150–400)
RBC: 2.86 MIL/uL — ABNORMAL LOW (ref 3.87–5.11)
RDW: 13.5 % (ref 11.5–15.5)
WBC: 10.4 10*3/uL (ref 4.0–10.5)
nRBC: 0 % (ref 0.0–0.2)

## 2023-10-17 LAB — BASIC METABOLIC PANEL WITH GFR
Anion gap: 10 (ref 5–15)
BUN: 19 mg/dL (ref 8–23)
CO2: 20 mmol/L — ABNORMAL LOW (ref 22–32)
Calcium: 7.7 mg/dL — ABNORMAL LOW (ref 8.9–10.3)
Chloride: 104 mmol/L (ref 98–111)
Creatinine, Ser: 1.11 mg/dL — ABNORMAL HIGH (ref 0.44–1.00)
GFR, Estimated: 49 mL/min — ABNORMAL LOW (ref >60)
Glucose, Bld: 89 mg/dL (ref 70–99)
Potassium: 3.7 mmol/L (ref 3.5–5.1)
Sodium: 134 mmol/L — ABNORMAL LOW (ref 135–145)

## 2023-10-17 LAB — ECHOCARDIOGRAM COMPLETE
Area-P 1/2: 3.32 cm2
Calc EF: 59.2 %
Height: 59 in
S' Lateral: 2.6 cm
Single Plane A2C EF: 52.9 %
Single Plane A4C EF: 64 %
Weight: 1568 [oz_av]

## 2023-10-17 LAB — ECHO TEE

## 2023-10-17 LAB — MAGNESIUM: Magnesium: 1.8 mg/dL (ref 1.7–2.4)

## 2023-10-17 SURGERY — TRANSESOPHAGEAL ECHOCARDIOGRAM (TEE) (CATHLAB)
Anesthesia: Monitor Anesthesia Care

## 2023-10-17 MED ORDER — LACTATED RINGERS IV SOLN
INTRAVENOUS | Status: DC | PRN
Start: 2023-10-17 — End: 2023-10-17

## 2023-10-17 MED ORDER — PROPOFOL 500 MG/50ML IV EMUL
INTRAVENOUS | Status: DC | PRN
Start: 2023-10-17 — End: 2023-10-17
  Administered 2023-10-17 (×2): 30 mg via INTRAVENOUS

## 2023-10-17 NOTE — Progress Notes (Signed)
 Physical Therapy Treatment Patient Details Name: [name] MRN: [id number] DOB: 1940-12-10 Today's Date: 10/17/2023   History of Present Illness Patient is a 83 year old female who presented on 3/25 with history of 2 day prior fall with hitting head on dumpster and AMS. Patient was admitted with UTI, dehydration and AKI. PMH: vascular dementia, over active bladder, TIA, hearing loss.    PT Comments  Pt seen for PT tx with pt received in bed, agreeable to tx with encouragement as pt c/o fatigue throughout session. Pt with decreased orientation, initiation, overall awareness & safety awareness. Pt requires mod assist for bed mobility & STS from EOB. Pt is able to ambulate in hallway with RW & min assist with ongoing cuing re: positioning within base of AD & maneuvering around obstacles. Pt encouraged to attempt gait a 2nd time or engage in exercises but pt declines 2/2 fatigue. Pt would benefit from ongoing PT services to progress mobility as able.   If plan is discharge home, recommend the following: A lot of help with walking and/or transfers;A lot of help with bathing/dressing/bathroom;Assistance with cooking/housework;Assist for transportation;Help with stairs or ramp for entrance;Direct supervision/assist for financial management;Supervision due to cognitive status   Can travel by private vehicle     Yes  Equipment Recommendations  Rolling walker (2 wheels)    Recommendations for Other Services       Precautions / Restrictions Precautions Precautions: Fall Recall of Precautions/Restrictions: Intact Restrictions Weight Bearing Restrictions Per Provider Order: No     Mobility  Bed Mobility Overal bed mobility: Needs Assistance Bed Mobility: Rolling, Sidelying to Sit Rolling: Supervision, Used rails Sidelying to sit: Mod assist, HOB elevated, Used rails (poor initiation, cuing to initiate & attempt uprighting trunk, seems to be limited by fatigue, slightly decreased engagement  2/2 fatigue)            Transfers   Equipment used: None Transfers: Sit to/from Stand             General transfer comment: Attempted STS from EOB without AD but pt reaching for various objects for support. Provided pt with RW & pt transferred STS from EOB with mod assist but poor awareness of safe hand placement as pt with BUE on RW. Pt also requires cuing to back up to chair then reach back during stand>sit.    Ambulation/Gait Ambulation/Gait assistance: Min assist Gait Distance (Feet): 80 Feet Assistive device: Rolling walker (2 wheels) Gait Pattern/deviations: Decreased step length - right, Decreased step length - left, Decreased stride length Gait velocity: decrease4d     General Gait Details: Ongoing cuing throughout gait to ambulate within base of AD vs pushing it out in front. Pt with decreased attention to L side, frequently bumping into objects, requiring extra time & cuing to maneuver around obstacles.   Stairs             Wheelchair Mobility     Tilt Bed    Modified Rankin (Stroke Patients Only)       Balance Overall balance assessment: Needs assistance Sitting-balance support: Feet unsupported, Bilateral upper extremity supported Sitting balance-Leahy Scale: Fair Sitting balance - Comments: CGA static sitting EOB   Standing balance support: Bilateral upper extremity supported, During functional activity, Reliant on assistive device for balance Standing balance-Leahy Scale: Poor Standing balance comment: reliant on BUE On RW  Communication Communication Communication: Impaired Factors Affecting Communication: Hearing impaired (hearing aide on tray table during session but pt appears to hear without issue during session)  Cognition Arousal: Alert Behavior During Therapy: Flat affect   PT - Cognitive impairments: No family/caregiver present to determine baseline, Orientation, Awareness, Memory, Problem  solving, Safety/Judgement, Initiation                       PT - Cognition Comments: Pt oriented to self & place but not situation or time. Following commands: Impaired Following commands impaired: Follows one step commands with increased time, Follows one step commands inconsistently    Cueing Cueing Techniques: Verbal cues, Gestural cues  Exercises      General Comments        Pertinent Vitals/Pain Pain Assessment Pain Assessment: No/denies pain    Home Living                          Prior Function            PT Goals (current goals can now be found in the care plan section) Acute Rehab PT Goals PT Goal Formulation: Patient unable to participate in goal setting Time For Goal Achievement: 10/29/23 Potential to Achieve Goals: Fair Progress towards PT goals: Progressing toward goals    Frequency    Min 2X/week      PT Plan      Co-evaluation              AM-PAC PT "6 Clicks" Mobility   Outcome Measure  Help needed turning from your back to your side while in a flat bed without using bedrails?: A Little Help needed moving from lying on your back to sitting on the side of a flat bed without using bedrails?: A Lot Help needed moving to and from a bed to a chair (including a wheelchair)?: A Lot Help needed standing up from a chair using your arms (e.g., wheelchair or bedside chair)?: A Lot Help needed to walk in hospital room?: A Lot Help needed climbing 3-5 steps with a railing? : A Lot 6 Click Score: 13    End of Session Equipment Utilized During Treatment: Gait belt Activity Tolerance: Patient limited by fatigue Patient left: in chair;with chair alarm set;with call bell/phone within reach Nurse Communication: Mobility status PT Visit Diagnosis: Unsteadiness on feet (R26.81);Muscle weakness (generalized) (M62.81);Difficulty in walking, not elsewhere classified (R26.2)     Time: 1610-9604 PT Time Calculation (min) (ACUTE ONLY):  15 min  Charges:    $Gait Training: 8-22 mins PT General Charges $$ ACUTE PT VISIT: 1 Visit                     Aleda Grana, PT, DPT 10/17/23, 12:24 PM   Sandi Mariscal 10/17/2023, 12:22 PM

## 2023-10-17 NOTE — TOC Progression Note (Signed)
 Transition of Care [facility]) - Progression Note    Patient Details  Name: [name] MRN: [id number] Date of Birth: [birth year]  [facility] of [facility]) CM/SW Contact  Otelia Santee, LCSW Phone Number: 10/17/2023, 10:37 AM  Clinical Narrative:    PASRR returned: [id number] E. Valid until 11/16/23.    Expected Discharge Plan: Skilled Nursing Facility Barriers to Discharge: Continued Medical Work up  Expected Discharge Plan and Services In-house Referral: Clinical Social Work Discharge Planning Services: NA Post Acute Care Choice: Skilled Nursing Facility Living arrangements for the past 2 months: Single Family Home                                       Social Determinants of Health (SDOH) Interventions SDOH Screenings   Food Insecurity: Low Risk  (09/25/2023)   Received from [facility]  Housing: Low Risk  (09/25/2023)   Received from [facility]  Transportation Needs: No Transportation Needs (09/25/2023)   Received from [facility]  Utilities: Low Risk  (09/25/2023)   Received from [facility]  Tobacco Use: High Risk (09/25/2023)   Received from [facility]    Readmission Risk Interventions    10/16/2023    1:47 PM  Readmission Risk Prevention Plan  Transportation Screening Complete  PCP or Specialist Appt within 5-7 Days Complete  Home Care Screening Complete  Medication Review (RN CM) Complete

## 2023-10-17 NOTE — H&P (View-Only) (Signed)
 PROGRESS NOTE  [name] UUV:[id number] DOB: February 19, 1941 DOA: 10/14/2023 PCP: Gwenyth Bender, FNP   LOS: 3 days   Brief Narrative / Interim history: 83 year old female with dementia, prior TIA, overactive bladder with incontinence comes into the hospital with increased confusion after having a fall in the backyard 2 days prior to admission.  She was out throwing something into the dumpster, and then slipped and fell hit her head.  Recovered initially, however has been having slightly worsened gait and increased confusion, and also felt very weak.  She was also noted to be feverish with a low-grade temp of 99.  She was hypotensive in the ER, found to have AKI and potentially a UTI.  She was placed on antibiotics and admitted to the hospital.  Brain imaging with head CT was unremarkable  Subjective / 24h Interval events: No complaints, feeling well.  Wants to go home  Assesement and Plan: Principal problem Acute metabolic encephalopathy -with increased confusion and gait changes, also with low-grade temperature at home and worsening of her urinary incontinence.  Encephalopathy is probably multifactorial with baseline dementia, recent mechanical fall with trauma to the head as well as UTI.  Treat with antibiotics, supportive care, PT recommends SNF, husband in agreement.  TOC consulted  Active problems Enterococcus UTI and Enterococcus bacteremia-with leukocytosis, pyuria, worsening urinary incontinence and possible fever.  Due to bacteremia ID consulted.  2D echo today, plan for TEE as well to rule out endocarditis -Final antibiotics plan per ID once TEE is completed -Surveillance cultures negative so far  Underlying dementia-continue home medications  Macrocytic anemia - no bleeding, hemoglobin stable, continue home B12  Scheduled Meds:  aspirin EC  81 mg Oral Daily   cyanocobalamin  1,000 mcg Oral Daily   donepezil  10 mg Oral q AM   enoxaparin (LOVENOX) injection  30 mg  Subcutaneous Q24H   escitalopram  20 mg Oral q AM   memantine  5 mg Oral Daily   mirabegron ER  25 mg Oral q AM   multivitamin with minerals  1 tablet Oral Daily   nicotine  7 mg Transdermal Daily   polyvinyl alcohol  1 drop Both Eyes q AM   sodium chloride flush  3-10 mL Intravenous Q12H   Continuous Infusions:  ampicillin (OMNIPEN) IV 2 g (10/17/23 0524)   PRN Meds:.acetaminophen, ALPRAZolam, sodium chloride flush  Current Outpatient Medications  Medication Instructions   ALPRAZolam (XANAX) 0.25 mg, Daily PRN   aspirin EC 81 mg, Daily   BLINK TEARS 0.25 % SOLN 1 drop, Every morning   Calcium-Vitamin D-Vitamin K 650-12.5-40 MG-MCG-MCG CHEW 1 tablet, Daily   cyanocobalamin (VITAMIN B12) 1,000 mcg, Daily   donepezil (ARICEPT) 10 mg, Oral, Every morning   escitalopram (LEXAPRO) 20 mg, Every morning   fexofenadine (ALLEGRA) 60 MG tablet 1-2 times daily if needed   fluticasone (FLONASE) 50 MCG/ACT nasal spray 1-2 sprays per nostril daily if needed   Ibuprofen 200 mg, Oral, Every 6 hours PRN   lidocaine (LIDODERM) 5 % 1 patch, Transdermal, Every 24 hours, Remove & Discard patch within 12 hours or as directed by MD   memantine (NAMENDA) 5 mg, 2 times daily   Multiple Vitamins-Minerals (ONE A DAY WOMEN 50 PLUS PO) 1 tablet, Daily after breakfast   Multiple Vitamins-Minerals (PRESERVISION AREDS 2) CAPS 1 capsule, 2 times daily   Myrbetriq 25 mg, Oral, Every morning   Probiotic Product (PROBIOTIC PO) 1 capsule, Daily   TYLENOL 500 mg, Oral, Every 6  hours PRN    Diet Orders (From admission, onward)     Start     Ordered   10/17/23 0001  Diet NPO time specified Except for: Sips with Meds  Diet effective midnight       Comments: Patient to remain NPO until fully awake following the TEE.  Question:  Except for  Answer:  Sips with Meds   10/16/23 2212            DVT prophylaxis: enoxaparin (LOVENOX) injection 30 mg Start: 10/14/23 1730   Lab Results  Component Value Date   PLT  226 10/17/2023      Code Status: Full Code  Family Communication: Husband over the phone  Status is: Inpatient Remains inpatient appropriate because: Severity of illness   Level of care: Telemetry  Consultants:  None  Objective: Vitals:   10/16/23 0433 10/16/23 1225 10/16/23 2000 10/17/23 0448  BP: (!) 106/56 (!) 113/56 (!) 130/56 (!) 106/50  Pulse: 76 77 79 78  Resp: 18   18  Temp: 99.9 F (37.7 C) 97.7 F (36.5 C) 98.6 F (37 C) 97.9 F (36.6 C)  TempSrc: Oral Oral Oral   SpO2: 90% (!) 87% (!) 89% 91%  Weight:      Height:        Intake/Output Summary (Last 24 hours) at 10/17/2023 1029 Last data filed at 10/17/2023 0600 Gross per 24 hour  Intake 350 ml  Output --  Net 350 ml   Wt Readings from Last 3 Encounters:  10/14/23 44.5 kg  06/20/23 43 kg  06/17/23 43 kg    Examination: Constitutional: NAD Eyes: lids and conjunctivae normal, no scleral icterus ENMT: mmm Neck: normal, supple Respiratory: clear to auscultation bilaterally, no wheezing, no crackles.  Cardiovascular: Regular rate and rhythm, no murmurs / rubs / gallops. No LE edema. Abdomen: soft, no distention, no tenderness. Bowel sounds positive.    Data Reviewed: I have independently reviewed following labs and imaging studies   CBC Recent Labs  Lab 10/14/23 1038 10/15/23 0605 10/16/23 0542 10/17/23 0459  WBC 14.1* 13.3* 9.8 10.4  HGB 9.0* 10.5* 9.3* 9.5*  HCT 27.4* 32.7* 28.5* 28.4*  PLT 179 190 200 226  MCV 100.0 101.2* 99.7 99.3  MCH 32.8 32.5 32.5 33.2  MCHC 32.8 32.1 32.6 33.5  RDW 14.3 14.0 13.7 13.5  LYMPHSABS  --  0.7  --   --   MONOABS  --  0.9  --   --   EOSABS  --  0.0  --   --   BASOSABS  --  0.0  --   --     Recent Labs  Lab 10/14/23 1038 10/14/23 1048 10/14/23 1254 10/15/23 0605 10/15/23 0606 10/16/23 0542 10/17/23 0459  NA 136  --   --  137  --  131* 134*  K 3.3*  --   --  3.9  --  3.6 3.7  CL 104  --   --  104  --  102 104  CO2 22  --   --  21*  --  23  20*  GLUCOSE 148*  --   --  83  --  111* 89  BUN 25*  --   --  28*  --  24* 19  CREATININE 1.38*  --   --  1.40*  --  1.11* 1.11*  CALCIUM 8.1*  --   --  8.1*  --  7.7* 7.7*  AST 28  --   --  29  --  25  --   ALT 15  --   --  18  --  18  --   ALKPHOS 43  --   --  43  --  42  --   BILITOT 0.5  --   --  0.6  --  0.3  --   ALBUMIN 3.1*  --   --  2.8*  --  2.4*  --   MG  --   --   --  1.8  --  1.8 1.8  LATICACIDVEN  --  1.8 0.5  --   --   --   --   TSH  --   --   --   --  0.819  --   --     ------------------------------------------------------------------------------------------------------------------ No results for input(s): "CHOL", "HDL", "LDLCALC", "TRIG", "CHOLHDL", "LDLDIRECT" in the last 72 hours.  No results found for: "HGBA1C" ------------------------------------------------------------------------------------------------------------------ Recent Labs    10/15/23 0606  TSH 0.819    Cardiac Enzymes No results for input(s): "CKMB", "TROPONINI", "MYOGLOBIN" in the last 168 hours.  Invalid input(s): "CK" ------------------------------------------------------------------------------------------------------------------ No results found for: "BNP"  CBG: Recent Labs  Lab 10/14/23 1059  GLUCAP 120*    Recent Results (from the past 240 hours)  Culture, blood (routine x 2)     Status: Abnormal   Collection Time: 10/14/23 11:25 AM   Specimen: BLOOD  Result Value Ref Range Status   Specimen Description   Final    BLOOD LEFT ANTECUBITAL Performed at [facility], [address] [place], [place] [address]    Special Requests   Final    BOTTLES DRAWN AEROBIC AND ANAEROBIC Blood Culture adequate volume Performed at [facility], [address], [place], [place] [address]    Culture  Setup Time   Final    GRAM POSITIVE COCCI IN CLUSTERS AEROBIC BOTTLE ONLY CRITICAL RESULT CALLED TO, READ BACK BY AND VERIFIED WITH: PHARMD NATHAN  BATCHELDER ON 10/15/23 @ 1353 BY DRT    Culture (A)  Final    STAPHYLOCOCCUS EPIDERMIDIS THE SIGNIFICANCE OF ISOLATING THIS ORGANISM FROM A SINGLE SET OF BLOOD CULTURES WHEN MULTIPLE SETS ARE DRAWN IS UNCERTAIN. PLEASE NOTIFY THE MICROBIOLOGY DEPARTMENT WITHIN ONE WEEK IF SPECIATION AND SENSITIVITIES ARE REQUIRED. Performed at [facility], [address], [place], [place] [address]    Report Status 10/16/2023 FINAL  Final  Culture, blood (routine x 2)     Status: Abnormal (Preliminary result)   Collection Time: 10/14/23 11:25 AM   Specimen: BLOOD  Result Value Ref Range Status   Specimen Description   Final    BLOOD BLOOD RIGHT ARM Performed at [facility], [address], [place], [place] [address]    Special Requests   Final    BOTTLES DRAWN AEROBIC AND ANAEROBIC Blood Culture adequate volume Performed at [facility], [address] [place], [place] [address]    Culture  Setup Time   Final    GRAM POSITIVE COCCI IN CHAINS ANAEROBIC BOTTLE ONLY CRITICAL RESULT CALLED TO, READ BACK BY AND VERIFIED WITH: PHARMD JUSTIN LEGGE ON 10/16/23 @ 1336 BY DRT    Culture (A)  Final    ENTEROCOCCUS FAECALIS CULTURE REINCUBATED FOR BETTER GROWTH Performed at [facility], [address] [place], [place] [address]    Report Status PENDING  Incomplete  Blood Culture ID Panel (Reflexed)     Status: Abnormal   Collection Time: 10/14/23  11:25 AM  Result Value Ref Range Status   Enterococcus faecalis NOT DETECTED NOT DETECTED Final   Enterococcus Faecium NOT DETECTED NOT DETECTED Final   Listeria monocytogenes NOT DETECTED NOT DETECTED Final   Staphylococcus species DETECTED (A) NOT DETECTED Final    Comment: CRITICAL RESULT CALLED TO, READ BACK BY AND VERIFIED WITH: PHARMD NATHAN BATCHELDER ON 10/15/23 @ 1353 BY DRT    Staphylococcus aureus (BCID) NOT DETECTED NOT DETECTED Final   Staphylococcus epidermidis DETECTED (A) NOT DETECTED Final     Comment: CRITICAL RESULT CALLED TO, READ BACK BY AND VERIFIED WITH: PHARMD NATHAN BATCHELDER ON 10/15/23 @ 1353 BY DRT    Staphylococcus lugdunensis NOT DETECTED NOT DETECTED Final   Streptococcus species NOT DETECTED NOT DETECTED Final   Streptococcus agalactiae NOT DETECTED NOT DETECTED Final   Streptococcus pneumoniae NOT DETECTED NOT DETECTED Final   Streptococcus pyogenes NOT DETECTED NOT DETECTED Final   A.calcoaceticus-baumannii NOT DETECTED NOT DETECTED Final   Bacteroides fragilis NOT DETECTED NOT DETECTED Final   Enterobacterales NOT DETECTED NOT DETECTED Final   Enterobacter cloacae complex NOT DETECTED NOT DETECTED Final   Escherichia coli NOT DETECTED NOT DETECTED Final   Klebsiella aerogenes NOT DETECTED NOT DETECTED Final   Klebsiella oxytoca NOT DETECTED NOT DETECTED Final   Klebsiella pneumoniae NOT DETECTED NOT DETECTED Final   Proteus species NOT DETECTED NOT DETECTED Final   Salmonella species NOT DETECTED NOT DETECTED Final   Serratia marcescens NOT DETECTED NOT DETECTED Final   Haemophilus influenzae NOT DETECTED NOT DETECTED Final   Neisseria meningitidis NOT DETECTED NOT DETECTED Final   Pseudomonas aeruginosa NOT DETECTED NOT DETECTED Final   Stenotrophomonas maltophilia NOT DETECTED NOT DETECTED Final   Candida albicans NOT DETECTED NOT DETECTED Final   Candida auris NOT DETECTED NOT DETECTED Final   Candida glabrata NOT DETECTED NOT DETECTED Final   Candida krusei NOT DETECTED NOT DETECTED Final   Candida parapsilosis NOT DETECTED NOT DETECTED Final   Candida tropicalis NOT DETECTED NOT DETECTED Final   Cryptococcus neoformans/gattii NOT DETECTED NOT DETECTED Final   Methicillin resistance mecA/C NOT DETECTED NOT DETECTED Final    Comment: Performed at [facility], [address], [place], [place] [address]  Blood Culture ID Panel (Reflexed)     Status: Abnormal   Collection Time: 10/14/23 11:25 AM  Result Value Ref Range Status   Enterococcus  faecalis DETECTED (A) NOT DETECTED Final    Comment: CRITICAL RESULT CALLED TO, READ BACK BY AND VERIFIED WITH: PHARMD JUSTIN LEGGE ON 10/16/23 @ 1336 BY DRT    Enterococcus Faecium NOT DETECTED NOT DETECTED Final   Listeria monocytogenes NOT DETECTED NOT DETECTED Final   Staphylococcus species NOT DETECTED NOT DETECTED Final   Staphylococcus aureus (BCID) NOT DETECTED NOT DETECTED Final   Staphylococcus epidermidis NOT DETECTED NOT DETECTED Final   Staphylococcus lugdunensis NOT DETECTED NOT DETECTED Final   Streptococcus species NOT DETECTED NOT DETECTED Final   Streptococcus agalactiae NOT DETECTED NOT DETECTED Final   Streptococcus pneumoniae NOT DETECTED NOT DETECTED Final   Streptococcus pyogenes NOT DETECTED NOT DETECTED Final   A.calcoaceticus-baumannii NOT DETECTED NOT DETECTED Final   Bacteroides fragilis NOT DETECTED NOT DETECTED Final   Enterobacterales NOT DETECTED NOT DETECTED Final   Enterobacter cloacae complex NOT DETECTED NOT DETECTED Final   Escherichia coli NOT DETECTED NOT DETECTED Final   Klebsiella aerogenes NOT DETECTED NOT DETECTED Final   Klebsiella oxytoca NOT DETECTED NOT DETECTED Final   Klebsiella pneumoniae NOT DETECTED NOT  DETECTED Final   Proteus species NOT DETECTED NOT DETECTED Final   Salmonella species NOT DETECTED NOT DETECTED Final   Serratia marcescens NOT DETECTED NOT DETECTED Final   Haemophilus influenzae NOT DETECTED NOT DETECTED Final   Neisseria meningitidis NOT DETECTED NOT DETECTED Final   Pseudomonas aeruginosa NOT DETECTED NOT DETECTED Final   Stenotrophomonas maltophilia NOT DETECTED NOT DETECTED Final   Candida albicans NOT DETECTED NOT DETECTED Final   Candida auris NOT DETECTED NOT DETECTED Final   Candida glabrata NOT DETECTED NOT DETECTED Final   Candida krusei NOT DETECTED NOT DETECTED Final   Candida parapsilosis NOT DETECTED NOT DETECTED Final   Candida tropicalis NOT DETECTED NOT DETECTED Final   Cryptococcus  neoformans/gattii NOT DETECTED NOT DETECTED Final   Vancomycin resistance NOT DETECTED NOT DETECTED Final    Comment: Performed at [facility], [address], [place], [place] [address]  Urine Culture     Status: Abnormal   Collection Time: 10/14/23 12:00 PM   Specimen: Urine, Random  Result Value Ref Range Status   Specimen Description   Final    URINE, RANDOM Performed at [facility], [address], [place], [place] [address]    Special Requests   Final    NONE Reflexed from [id number] Performed at [facility], [address], [place], [place] [address]    Culture 30,000 COLONIES/mL ENTEROCOCCUS FAECALIS (A)  Final   Report Status 10/16/2023 FINAL  Final   Organism ID, Bacteria ENTEROCOCCUS FAECALIS (A)  Final      Susceptibility   Enterococcus faecalis - MIC*    AMPICILLIN <=2 SENSITIVE Sensitive     NITROFURANTOIN <=16 SENSITIVE Sensitive     VANCOMYCIN 1 SENSITIVE Sensitive     * 30,000 COLONIES/mL ENTEROCOCCUS FAECALIS  Culture, blood (Routine X 2) w Reflex to ID Panel     Status: None (Preliminary result)   Collection Time: 10/16/23  9:18 AM   Specimen: BLOOD RIGHT ARM  Result Value Ref Range Status   Specimen Description   Final    BLOOD RIGHT ARM Performed at [facility], [address], [place], [place] [address]    Special Requests   Final    BOTTLES DRAWN AEROBIC ONLY Blood Culture results may not be optimal due to an inadequate volume of blood received in culture bottles Performed at [facility], [address], [place], [place] [address]    Culture   Final    NO GROWTH < 24 HOURS Performed at [facility], [address] [place], [place] [address]    Report Status PENDING  Incomplete  Culture, blood (Routine X 2) w Reflex to ID Panel     Status: None (Preliminary result)   Collection Time: 10/16/23  9:23 AM   Specimen: BLOOD RIGHT HAND  Result Value Ref Range Status   Specimen  Description   Final    BLOOD RIGHT HAND Performed at [facility], [address] [place], [place] [address]    Special Requests   Final    BOTTLES DRAWN AEROBIC ONLY Blood Culture results may not be optimal due to an inadequate volume of blood received in culture bottles Performed at [facility], [address], [place], [place] [address]    Culture   Final    NO GROWTH < 24 HOURS Performed at [facility], [address], [place], [place] [address]    Report Status  PENDING  Incomplete     Radiology Studies: No results found.    Pamella Pert, MD, PhD [facility]  Between 7 am - 7 pm I am available, please contact me via Amion (for emergencies) or Securechat (non urgent messages)  Between 7 pm - 7 am I am not available, please contact night coverage MD/APP via Amion

## 2023-10-17 NOTE — Progress Notes (Signed)
 PROGRESS NOTE  [name] UUV:[id number] DOB: February 19, 1941 DOA: 10/14/2023 PCP: Gwenyth Bender, FNP   LOS: 3 days   Brief Narrative / Interim history: 83 year old female with dementia, prior TIA, overactive bladder with incontinence comes into the hospital with increased confusion after having a fall in the backyard 2 days prior to admission.  She was out throwing something into the dumpster, and then slipped and fell hit her head.  Recovered initially, however has been having slightly worsened gait and increased confusion, and also felt very weak.  She was also noted to be feverish with a low-grade temp of 99.  She was hypotensive in the ER, found to have AKI and potentially a UTI.  She was placed on antibiotics and admitted to the hospital.  Brain imaging with head CT was unremarkable  Subjective / 24h Interval events: No complaints, feeling well.  Wants to go home  Assesement and Plan: Principal problem Acute metabolic encephalopathy -with increased confusion and gait changes, also with low-grade temperature at home and worsening of her urinary incontinence.  Encephalopathy is probably multifactorial with baseline dementia, recent mechanical fall with trauma to the head as well as UTI.  Treat with antibiotics, supportive care, PT recommends SNF, husband in agreement.  TOC consulted  Active problems Enterococcus UTI and Enterococcus bacteremia-with leukocytosis, pyuria, worsening urinary incontinence and possible fever.  Due to bacteremia ID consulted.  2D echo today, plan for TEE as well to rule out endocarditis -Final antibiotics plan per ID once TEE is completed -Surveillance cultures negative so far  Underlying dementia-continue home medications  Macrocytic anemia - no bleeding, hemoglobin stable, continue home B12  Scheduled Meds:  aspirin EC  81 mg Oral Daily   cyanocobalamin  1,000 mcg Oral Daily   donepezil  10 mg Oral q AM   enoxaparin (LOVENOX) injection  30 mg  Subcutaneous Q24H   escitalopram  20 mg Oral q AM   memantine  5 mg Oral Daily   mirabegron ER  25 mg Oral q AM   multivitamin with minerals  1 tablet Oral Daily   nicotine  7 mg Transdermal Daily   polyvinyl alcohol  1 drop Both Eyes q AM   sodium chloride flush  3-10 mL Intravenous Q12H   Continuous Infusions:  ampicillin (OMNIPEN) IV 2 g (10/17/23 0524)   PRN Meds:.acetaminophen, ALPRAZolam, sodium chloride flush  Current Outpatient Medications  Medication Instructions   ALPRAZolam (XANAX) 0.25 mg, Daily PRN   aspirin EC 81 mg, Daily   BLINK TEARS 0.25 % SOLN 1 drop, Every morning   Calcium-Vitamin D-Vitamin K 650-12.5-40 MG-MCG-MCG CHEW 1 tablet, Daily   cyanocobalamin (VITAMIN B12) 1,000 mcg, Daily   donepezil (ARICEPT) 10 mg, Oral, Every morning   escitalopram (LEXAPRO) 20 mg, Every morning   fexofenadine (ALLEGRA) 60 MG tablet 1-2 times daily if needed   fluticasone (FLONASE) 50 MCG/ACT nasal spray 1-2 sprays per nostril daily if needed   Ibuprofen 200 mg, Oral, Every 6 hours PRN   lidocaine (LIDODERM) 5 % 1 patch, Transdermal, Every 24 hours, Remove & Discard patch within 12 hours or as directed by MD   memantine (NAMENDA) 5 mg, 2 times daily   Multiple Vitamins-Minerals (ONE A DAY WOMEN 50 PLUS PO) 1 tablet, Daily after breakfast   Multiple Vitamins-Minerals (PRESERVISION AREDS 2) CAPS 1 capsule, 2 times daily   Myrbetriq 25 mg, Oral, Every morning   Probiotic Product (PROBIOTIC PO) 1 capsule, Daily   TYLENOL 500 mg, Oral, Every 6  hours PRN    Diet Orders (From admission, onward)     Start     Ordered   10/17/23 0001  Diet NPO time specified Except for: Sips with Meds  Diet effective midnight       Comments: Patient to remain NPO until fully awake following the TEE.  Question:  Except for  Answer:  Sips with Meds   10/16/23 2212            DVT prophylaxis: enoxaparin (LOVENOX) injection 30 mg Start: 10/14/23 1730   Lab Results  Component Value Date   PLT  226 10/17/2023      Code Status: Full Code  Family Communication: Husband over the phone  Status is: Inpatient Remains inpatient appropriate because: Severity of illness   Level of care: Telemetry  Consultants:  None  Objective: Vitals:   10/16/23 0433 10/16/23 1225 10/16/23 2000 10/17/23 0448  BP: (!) 106/56 (!) 113/56 (!) 130/56 (!) 106/50  Pulse: 76 77 79 78  Resp: 18   18  Temp: 99.9 F (37.7 C) 97.7 F (36.5 C) 98.6 F (37 C) 97.9 F (36.6 C)  TempSrc: Oral Oral Oral   SpO2: 90% (!) 87% (!) 89% 91%  Weight:      Height:        Intake/Output Summary (Last 24 hours) at 10/17/2023 1029 Last data filed at 10/17/2023 0600 Gross per 24 hour  Intake 350 ml  Output --  Net 350 ml   Wt Readings from Last 3 Encounters:  10/14/23 44.5 kg  06/20/23 43 kg  06/17/23 43 kg    Examination: Constitutional: NAD Eyes: lids and conjunctivae normal, no scleral icterus ENMT: mmm Neck: normal, supple Respiratory: clear to auscultation bilaterally, no wheezing, no crackles.  Cardiovascular: Regular rate and rhythm, no murmurs / rubs / gallops. No LE edema. Abdomen: soft, no distention, no tenderness. Bowel sounds positive.    Data Reviewed: I have independently reviewed following labs and imaging studies   CBC Recent Labs  Lab 10/14/23 1038 10/15/23 0605 10/16/23 0542 10/17/23 0459  WBC 14.1* 13.3* 9.8 10.4  HGB 9.0* 10.5* 9.3* 9.5*  HCT 27.4* 32.7* 28.5* 28.4*  PLT 179 190 200 226  MCV 100.0 101.2* 99.7 99.3  MCH 32.8 32.5 32.5 33.2  MCHC 32.8 32.1 32.6 33.5  RDW 14.3 14.0 13.7 13.5  LYMPHSABS  --  0.7  --   --   MONOABS  --  0.9  --   --   EOSABS  --  0.0  --   --   BASOSABS  --  0.0  --   --     Recent Labs  Lab 10/14/23 1038 10/14/23 1048 10/14/23 1254 10/15/23 0605 10/15/23 0606 10/16/23 0542 10/17/23 0459  NA 136  --   --  137  --  131* 134*  K 3.3*  --   --  3.9  --  3.6 3.7  CL 104  --   --  104  --  102 104  CO2 22  --   --  21*  --  23  20*  GLUCOSE 148*  --   --  83  --  111* 89  BUN 25*  --   --  28*  --  24* 19  CREATININE 1.38*  --   --  1.40*  --  1.11* 1.11*  CALCIUM 8.1*  --   --  8.1*  --  7.7* 7.7*  AST 28  --   --  29  --  25  --   ALT 15  --   --  18  --  18  --   ALKPHOS 43  --   --  43  --  42  --   BILITOT 0.5  --   --  0.6  --  0.3  --   ALBUMIN 3.1*  --   --  2.8*  --  2.4*  --   MG  --   --   --  1.8  --  1.8 1.8  LATICACIDVEN  --  1.8 0.5  --   --   --   --   TSH  --   --   --   --  0.819  --   --     ------------------------------------------------------------------------------------------------------------------ No results for input(s): "CHOL", "HDL", "LDLCALC", "TRIG", "CHOLHDL", "LDLDIRECT" in the last 72 hours.  No results found for: "HGBA1C" ------------------------------------------------------------------------------------------------------------------ Recent Labs    10/15/23 0606  TSH 0.819    Cardiac Enzymes No results for input(s): "CKMB", "TROPONINI", "MYOGLOBIN" in the last 168 hours.  Invalid input(s): "CK" ------------------------------------------------------------------------------------------------------------------ No results found for: "BNP"  CBG: Recent Labs  Lab 10/14/23 1059  GLUCAP 120*    Recent Results (from the past 240 hours)  Culture, blood (routine x 2)     Status: Abnormal   Collection Time: 10/14/23 11:25 AM   Specimen: BLOOD  Result Value Ref Range Status   Specimen Description   Final    BLOOD LEFT ANTECUBITAL Performed at [facility], [address] [place], [place] [address]    Special Requests   Final    BOTTLES DRAWN AEROBIC AND ANAEROBIC Blood Culture adequate volume Performed at [facility], [address], [place], [place] [address]    Culture  Setup Time   Final    GRAM POSITIVE COCCI IN CLUSTERS AEROBIC BOTTLE ONLY CRITICAL RESULT CALLED TO, READ BACK BY AND VERIFIED WITH: PHARMD NATHAN  BATCHELDER ON 10/15/23 @ 1353 BY DRT    Culture (A)  Final    STAPHYLOCOCCUS EPIDERMIDIS THE SIGNIFICANCE OF ISOLATING THIS ORGANISM FROM A SINGLE SET OF BLOOD CULTURES WHEN MULTIPLE SETS ARE DRAWN IS UNCERTAIN. PLEASE NOTIFY THE MICROBIOLOGY DEPARTMENT WITHIN ONE WEEK IF SPECIATION AND SENSITIVITIES ARE REQUIRED. Performed at [facility], [address], [place], [place] [address]    Report Status 10/16/2023 FINAL  Final  Culture, blood (routine x 2)     Status: Abnormal (Preliminary result)   Collection Time: 10/14/23 11:25 AM   Specimen: BLOOD  Result Value Ref Range Status   Specimen Description   Final    BLOOD BLOOD RIGHT ARM Performed at [facility], [address], [place], [place] [address]    Special Requests   Final    BOTTLES DRAWN AEROBIC AND ANAEROBIC Blood Culture adequate volume Performed at [facility], [address] [place], [place] [address]    Culture  Setup Time   Final    GRAM POSITIVE COCCI IN CHAINS ANAEROBIC BOTTLE ONLY CRITICAL RESULT CALLED TO, READ BACK BY AND VERIFIED WITH: PHARMD JUSTIN LEGGE ON 10/16/23 @ 1336 BY DRT    Culture (A)  Final    ENTEROCOCCUS FAECALIS CULTURE REINCUBATED FOR BETTER GROWTH Performed at [facility], [address] [place], [place] [address]    Report Status PENDING  Incomplete  Blood Culture ID Panel (Reflexed)     Status: Abnormal   Collection Time: 10/14/23  11:25 AM  Result Value Ref Range Status   Enterococcus faecalis NOT DETECTED NOT DETECTED Final   Enterococcus Faecium NOT DETECTED NOT DETECTED Final   Listeria monocytogenes NOT DETECTED NOT DETECTED Final   Staphylococcus species DETECTED (A) NOT DETECTED Final    Comment: CRITICAL RESULT CALLED TO, READ BACK BY AND VERIFIED WITH: PHARMD NATHAN BATCHELDER ON 10/15/23 @ 1353 BY DRT    Staphylococcus aureus (BCID) NOT DETECTED NOT DETECTED Final   Staphylococcus epidermidis DETECTED (A) NOT DETECTED Final     Comment: CRITICAL RESULT CALLED TO, READ BACK BY AND VERIFIED WITH: PHARMD NATHAN BATCHELDER ON 10/15/23 @ 1353 BY DRT    Staphylococcus lugdunensis NOT DETECTED NOT DETECTED Final   Streptococcus species NOT DETECTED NOT DETECTED Final   Streptococcus agalactiae NOT DETECTED NOT DETECTED Final   Streptococcus pneumoniae NOT DETECTED NOT DETECTED Final   Streptococcus pyogenes NOT DETECTED NOT DETECTED Final   A.calcoaceticus-baumannii NOT DETECTED NOT DETECTED Final   Bacteroides fragilis NOT DETECTED NOT DETECTED Final   Enterobacterales NOT DETECTED NOT DETECTED Final   Enterobacter cloacae complex NOT DETECTED NOT DETECTED Final   Escherichia coli NOT DETECTED NOT DETECTED Final   Klebsiella aerogenes NOT DETECTED NOT DETECTED Final   Klebsiella oxytoca NOT DETECTED NOT DETECTED Final   Klebsiella pneumoniae NOT DETECTED NOT DETECTED Final   Proteus species NOT DETECTED NOT DETECTED Final   Salmonella species NOT DETECTED NOT DETECTED Final   Serratia marcescens NOT DETECTED NOT DETECTED Final   Haemophilus influenzae NOT DETECTED NOT DETECTED Final   Neisseria meningitidis NOT DETECTED NOT DETECTED Final   Pseudomonas aeruginosa NOT DETECTED NOT DETECTED Final   Stenotrophomonas maltophilia NOT DETECTED NOT DETECTED Final   Candida albicans NOT DETECTED NOT DETECTED Final   Candida auris NOT DETECTED NOT DETECTED Final   Candida glabrata NOT DETECTED NOT DETECTED Final   Candida krusei NOT DETECTED NOT DETECTED Final   Candida parapsilosis NOT DETECTED NOT DETECTED Final   Candida tropicalis NOT DETECTED NOT DETECTED Final   Cryptococcus neoformans/gattii NOT DETECTED NOT DETECTED Final   Methicillin resistance mecA/C NOT DETECTED NOT DETECTED Final    Comment: Performed at [facility], [address], [place], [place] [address]  Blood Culture ID Panel (Reflexed)     Status: Abnormal   Collection Time: 10/14/23 11:25 AM  Result Value Ref Range Status   Enterococcus  faecalis DETECTED (A) NOT DETECTED Final    Comment: CRITICAL RESULT CALLED TO, READ BACK BY AND VERIFIED WITH: PHARMD JUSTIN LEGGE ON 10/16/23 @ 1336 BY DRT    Enterococcus Faecium NOT DETECTED NOT DETECTED Final   Listeria monocytogenes NOT DETECTED NOT DETECTED Final   Staphylococcus species NOT DETECTED NOT DETECTED Final   Staphylococcus aureus (BCID) NOT DETECTED NOT DETECTED Final   Staphylococcus epidermidis NOT DETECTED NOT DETECTED Final   Staphylococcus lugdunensis NOT DETECTED NOT DETECTED Final   Streptococcus species NOT DETECTED NOT DETECTED Final   Streptococcus agalactiae NOT DETECTED NOT DETECTED Final   Streptococcus pneumoniae NOT DETECTED NOT DETECTED Final   Streptococcus pyogenes NOT DETECTED NOT DETECTED Final   A.calcoaceticus-baumannii NOT DETECTED NOT DETECTED Final   Bacteroides fragilis NOT DETECTED NOT DETECTED Final   Enterobacterales NOT DETECTED NOT DETECTED Final   Enterobacter cloacae complex NOT DETECTED NOT DETECTED Final   Escherichia coli NOT DETECTED NOT DETECTED Final   Klebsiella aerogenes NOT DETECTED NOT DETECTED Final   Klebsiella oxytoca NOT DETECTED NOT DETECTED Final   Klebsiella pneumoniae NOT DETECTED NOT  DETECTED Final   Proteus species NOT DETECTED NOT DETECTED Final   Salmonella species NOT DETECTED NOT DETECTED Final   Serratia marcescens NOT DETECTED NOT DETECTED Final   Haemophilus influenzae NOT DETECTED NOT DETECTED Final   Neisseria meningitidis NOT DETECTED NOT DETECTED Final   Pseudomonas aeruginosa NOT DETECTED NOT DETECTED Final   Stenotrophomonas maltophilia NOT DETECTED NOT DETECTED Final   Candida albicans NOT DETECTED NOT DETECTED Final   Candida auris NOT DETECTED NOT DETECTED Final   Candida glabrata NOT DETECTED NOT DETECTED Final   Candida krusei NOT DETECTED NOT DETECTED Final   Candida parapsilosis NOT DETECTED NOT DETECTED Final   Candida tropicalis NOT DETECTED NOT DETECTED Final   Cryptococcus  neoformans/gattii NOT DETECTED NOT DETECTED Final   Vancomycin resistance NOT DETECTED NOT DETECTED Final    Comment: Performed at [facility], [address], [place], [place] [address]  Urine Culture     Status: Abnormal   Collection Time: 10/14/23 12:00 PM   Specimen: Urine, Random  Result Value Ref Range Status   Specimen Description   Final    URINE, RANDOM Performed at [facility], [address], [place], [place] [address]    Special Requests   Final    NONE Reflexed from [id number] Performed at [facility], [address], [place], [place] [address]    Culture 30,000 COLONIES/mL ENTEROCOCCUS FAECALIS (A)  Final   Report Status 10/16/2023 FINAL  Final   Organism ID, Bacteria ENTEROCOCCUS FAECALIS (A)  Final      Susceptibility   Enterococcus faecalis - MIC*    AMPICILLIN <=2 SENSITIVE Sensitive     NITROFURANTOIN <=16 SENSITIVE Sensitive     VANCOMYCIN 1 SENSITIVE Sensitive     * 30,000 COLONIES/mL ENTEROCOCCUS FAECALIS  Culture, blood (Routine X 2) w Reflex to ID Panel     Status: None (Preliminary result)   Collection Time: 10/16/23  9:18 AM   Specimen: BLOOD RIGHT ARM  Result Value Ref Range Status   Specimen Description   Final    BLOOD RIGHT ARM Performed at [facility], [address], [place], [place] [address]    Special Requests   Final    BOTTLES DRAWN AEROBIC ONLY Blood Culture results may not be optimal due to an inadequate volume of blood received in culture bottles Performed at [facility], [address], [place], [place] [address]    Culture   Final    NO GROWTH < 24 HOURS Performed at [facility], [address] [place], [place] [address]    Report Status PENDING  Incomplete  Culture, blood (Routine X 2) w Reflex to ID Panel     Status: None (Preliminary result)   Collection Time: 10/16/23  9:23 AM   Specimen: BLOOD RIGHT HAND  Result Value Ref Range Status   Specimen  Description   Final    BLOOD RIGHT HAND Performed at [facility], [address] [place], [place] [address]    Special Requests   Final    BOTTLES DRAWN AEROBIC ONLY Blood Culture results may not be optimal due to an inadequate volume of blood received in culture bottles Performed at [facility], [address], [place], [place] [address]    Culture   Final    NO GROWTH < 24 HOURS Performed at [facility], [address], [place], [place] [address]    Report Status  PENDING  Incomplete     Radiology Studies: No results found.    Pamella Pert, MD, PhD [facility]  Between 7 am - 7 pm I am available, please contact me via Amion (for emergencies) or Securechat (non urgent messages)  Between 7 pm - 7 am I am not available, please contact night coverage MD/APP via Amion

## 2023-10-17 NOTE — Transfer of Care (Signed)
 Immediate Anesthesia Transfer of Care Note  Patient: [name]  Procedure(s) Performed: TRANSESOPHAGEAL ECHOCARDIOGRAM  Patient Location: PACU  Anesthesia Type:MAC  Level of Consciousness: awake, alert , and oriented  Airway & Oxygen Therapy: Patient Spontanous Breathing  Post-op Assessment: Report given to RN and Post -op Vital signs reviewed and stable  Post vital signs: Reviewed and stable  Last Vitals:  Vitals Value Taken Time  BP    Temp    Pulse 85 10/17/23 1413  Resp 19 10/17/23 1413  SpO2 92 % 10/17/23 1413  Vitals shown include unfiled device data.  Last Pain:  Vitals:   10/17/23 1335  TempSrc:   PainSc: 0-No pain      Patients Stated Pain Goal: 2 (10/15/23 2053)  Complications: No notable events documented.

## 2023-10-17 NOTE — Anesthesia Preprocedure Evaluation (Signed)
 Anesthesia Evaluation  Patient identified by MRN, date of birth, ID band Patient awake    Reviewed: Allergy & Precautions, H&P , NPO status , Patient's Chart, lab work & pertinent test results  Airway Mallampati: II  TM Distance: >3 FB Neck ROM: Full    Dental no notable dental hx.    Pulmonary COPD, Current Smoker and Patient abstained from smoking.   Pulmonary exam normal breath sounds clear to auscultation       Cardiovascular negative cardio ROS Normal cardiovascular exam Rhythm:Regular Rate:Normal     Neuro/Psych   Anxiety Depression   Dementia negative neurological ROS  negative psych ROS   GI/Hepatic Neg liver ROS,GERD  ,,  Endo/Other  negative endocrine ROS    Renal/GU negative Renal ROS  negative genitourinary   Musculoskeletal  (+) Arthritis , Osteoarthritis,    Abdominal   Peds negative pediatric ROS (+)  Hematology negative hematology ROS (+)   Anesthesia Other Findings   Reproductive/Obstetrics negative OB ROS                             Anesthesia Physical Anesthesia Plan  ASA: 3  Anesthesia Plan: MAC   Post-op Pain Management: Minimal or no pain anticipated   Induction: Intravenous  PONV Risk Score and Plan: 1 and Ondansetron and Treatment may vary due to age or medical condition  Airway Management Planned: Nasal Cannula  Additional Equipment:   Intra-op Plan:   Post-operative Plan:   Informed Consent: I have reviewed the patients History and Physical, chart, labs and discussed the procedure including the risks, benefits and alternatives for the proposed anesthesia with the patient or authorized representative who has indicated his/her understanding and acceptance.     Dental advisory given  Plan Discussed with: CRNA  Anesthesia Plan Comments:        Anesthesia Quick Evaluation

## 2023-10-17 NOTE — Progress Notes (Signed)
     Transesophageal Echocardiogram Note  [name] [id number] [birth year]  Procedure: Transesophageal Echocardiogram Indications: Bacteremia  Procedure Details Consent: Obtained Time Out: Verified patient identification, verified procedure, site/side was marked, verified correct patient position, special equipment/implants available, Radiology Safety Procedures followed,  medications/allergies/relevent history reviewed, required imaging and test results available.  Performed  Medications:  Pt sedated by anesthesia with diprovan 60 mg IV total.  Normal LV function; mild MR and TR; no vegetations.    Complications: No apparent complications Patient did tolerate procedure well.  [name], MD

## 2023-10-17 NOTE — Interval H&P Note (Signed)
 History and Physical Interval Note:  10/17/2023 1:41 PM  [name]  has presented today for surgery, with the diagnosis of bacteremia.  The various methods of treatment have been discussed with the patient and family. After consideration of risks, benefits and other options for treatment, the patient has consented to  Procedure(s): TRANSESOPHAGEAL ECHOCARDIOGRAM (N/A) as a surgical intervention.  The patient's history has been reviewed, patient examined, no change in status, stable for surgery.  I have reviewed the patient's chart and labs.  Questions were answered to the patient's satisfaction.     [name]

## 2023-10-17 NOTE — Progress Notes (Signed)
 Id brief note  Repeat bcx negative Tee negative  E faecalis BSI. Source urinary  Finish 2 week abx from 3/27, and on discharge can change to amoxicillin 1g q8hr Discussed with primary team No id clinic f/u needed

## 2023-10-17 NOTE — Progress Notes (Signed)
  Echocardiogram 2D Echocardiogram has been performed.  Janalyn Harder 10/17/2023, 9:20 AM

## 2023-10-17 NOTE — Plan of Care (Signed)

## 2023-10-18 DIAGNOSIS — N3001 Acute cystitis with hematuria: Secondary | ICD-10-CM | POA: Diagnosis not present

## 2023-10-18 NOTE — TOC Progression Note (Addendum)
 Transition of Care [facility]) - Progression Note    Patient Details  Name: [name] MRN: [id number] Date of Birth: 1941/03/28  [facility] CM/SW Contact  Howell Rucks, RN Phone Number: 10/18/2023, 9:34 AM  Clinical Narrative:   Teams chat from attending inquiring if pt can dc today. NCM faxed out for bed offers, will need insurance auth, team notified.     Expected Discharge Plan: Skilled Nursing Facility Barriers to Discharge: Continued Medical Work up  Expected Discharge Plan and Services In-house Referral: Clinical Social Work Discharge Planning Services: NA Post Acute Care Choice: Skilled Nursing Facility Living arrangements for the past 2 months: Single Family Home                                       Social Determinants of Health (SDOH) Interventions SDOH Screenings   Food Insecurity: Low Risk  (09/25/2023)   Received from [facility]  Housing: Low Risk  (09/25/2023)   Received from [facility]  Transportation Needs: No Transportation Needs (09/25/2023)   Received from [facility]  Utilities: Low Risk  (09/25/2023)   Received from [facility]  Tobacco Use: High Risk (10/17/2023)    Readmission Risk Interventions    10/16/2023    1:47 PM  Readmission Risk Prevention Plan  Transportation Screening Complete  PCP or Specialist Appt within 5-7 Days Complete  Home Care Screening Complete  Medication Review (RN CM) Complete

## 2023-10-18 NOTE — Progress Notes (Signed)
 PROGRESS NOTE  [name]:[id number] DOB: [birth year] DOA: 10/14/2023 PCP: Gwenyth Bender, FNP   LOS: 4 days   Brief Narrative / Interim history: 83 year old female with dementia, prior TIA, overactive bladder with incontinence comes into the hospital with increased confusion after having a fall in the backyard 2 days prior to admission.  She was out throwing something into the dumpster, and then slipped and fell hit her head.  Recovered initially, however has been having slightly worsened gait and increased confusion, and also felt very weak.  She was also noted to be feverish with a low-grade temp of 99.  She was hypotensive in the ER, found to have AKI and potentially a UTI.  She was placed on antibiotics and admitted to the hospital.  Brain imaging with head CT was unremarkable  Subjective / 24h Interval events: No complaints, feeling well.  Wants to go home  Assesement and Plan: Principal problem Acute metabolic encephalopathy -with increased confusion and gait changes, also with low-grade temperature at home and worsening of her urinary incontinence.  Encephalopathy is probably multifactorial with baseline dementia, recent mechanical fall with trauma to the head as well as UTI and bacteremia.  Treat with antibiotics, supportive care, PT recommends SNF, husband in agreement.  TOC consulted  Active problems Enterococcus UTI and Enterococcus bacteremia-with leukocytosis, pyuria, worsening urinary incontinence and possible fever.  Due to bacteremia ID consulted.  Surveillance cultures no growth to date.  2D echo as well as TEE did not show any evidence of endocarditis -Currently on IV ampicillin, she can be transitioned to p.o. ampicillin upon discharge for total of 2 weeks  Underlying dementia-continue home medications  Macrocytic anemia - no bleeding, hemoglobin stable, continue home B12  Scheduled Meds:  aspirin EC  81 mg Oral Daily   cyanocobalamin  1,000 mcg Oral Daily    donepezil  10 mg Oral q AM   enoxaparin (LOVENOX) injection  30 mg Subcutaneous Q24H   escitalopram  20 mg Oral q AM   memantine  5 mg Oral Daily   mirabegron ER  25 mg Oral q AM   multivitamin with minerals  1 tablet Oral Daily   nicotine  7 mg Transdermal Daily   polyvinyl alcohol  1 drop Both Eyes q AM   Continuous Infusions:  ampicillin (OMNIPEN) IV 2 g (10/18/23 0534)   PRN Meds:.acetaminophen, ALPRAZolam  Current Outpatient Medications  Medication Instructions   ALPRAZolam (XANAX) 0.25 mg, Daily PRN   aspirin EC 81 mg, Daily   BLINK TEARS 0.25 % SOLN 1 drop, Every morning   Calcium-Vitamin D-Vitamin K 650-12.5-40 MG-MCG-MCG CHEW 1 tablet, Daily   cyanocobalamin (VITAMIN B12) 1,000 mcg, Daily   donepezil (ARICEPT) 10 mg, Oral, Every morning   escitalopram (LEXAPRO) 20 mg, Every morning   fexofenadine (ALLEGRA) 60 MG tablet 1-2 times daily if needed   fluticasone (FLONASE) 50 MCG/ACT nasal spray 1-2 sprays per nostril daily if needed   Ibuprofen 200 mg, Oral, Every 6 hours PRN   lidocaine (LIDODERM) 5 % 1 patch, Transdermal, Every 24 hours, Remove & Discard patch within 12 hours or as directed by MD   memantine (NAMENDA) 5 mg, 2 times daily   Multiple Vitamins-Minerals (ONE A DAY WOMEN 50 PLUS PO) 1 tablet, Daily after breakfast   Multiple Vitamins-Minerals (PRESERVISION AREDS 2) CAPS 1 capsule, 2 times daily   Myrbetriq 25 mg, Oral, Every morning   Probiotic Product (PROBIOTIC PO) 1 capsule, Daily   TYLENOL 500 mg, Oral, Every  6 hours PRN    Diet Orders (From admission, onward)     Start     Ordered   10/17/23 1454  Diet regular Fluid consistency: Thin  Diet effective now       Question:  Fluid consistency:  Answer:  Thin   10/17/23 1453            DVT prophylaxis: enoxaparin (LOVENOX) injection 30 mg Start: 10/14/23 1730   Lab Results  Component Value Date   PLT 226 10/17/2023      Code Status: Full Code  Family Communication: Husband over the  phone  Status is: Inpatient Remains inpatient appropriate because: Severity of illness   Level of care: Telemetry  Consultants:  None  Objective: Vitals:   10/17/23 1413 10/17/23 1426 10/17/23 2002 10/18/23 0421  BP: (!) 113/57  (!) 123/55 (!) 109/55  Pulse: 86  80 63  Resp: 18  18 18   Temp: (!) 100.5 F (38.1 C) 98.5 F (36.9 C) 99.9 F (37.7 C) 98.1 F (36.7 C)  TempSrc: Temporal Temporal Oral Oral  SpO2: 92%  94% 95%  Weight:      Height:        Intake/Output Summary (Last 24 hours) at 10/18/2023 1123 Last data filed at 10/17/2023 1414 Gross per 24 hour  Intake 50 ml  Output --  Net 50 ml   Wt Readings from Last 3 Encounters:  10/14/23 44.5 kg  06/20/23 43 kg  06/17/23 43 kg    Examination: Constitutional: NAD Eyes: lids and conjunctivae normal, no scleral icterus ENMT: mmm Neck: normal, supple Respiratory: clear to auscultation bilaterally, no wheezing, no crackles.  Cardiovascular: Regular rate and rhythm, no murmurs / rubs / gallops. No LE edema. Abdomen: soft, no distention, no tenderness. Bowel sounds positive.  Skin: no rashes  Data Reviewed: I have independently reviewed following labs and imaging studies   CBC Recent Labs  Lab 10/14/23 1038 10/15/23 0605 10/16/23 0542 10/17/23 0459  WBC 14.1* 13.3* 9.8 10.4  HGB 9.0* 10.5* 9.3* 9.5*  HCT 27.4* 32.7* 28.5* 28.4*  PLT 179 190 200 226  MCV 100.0 101.2* 99.7 99.3  MCH 32.8 32.5 32.5 33.2  MCHC 32.8 32.1 32.6 33.5  RDW 14.3 14.0 13.7 13.5  LYMPHSABS  --  0.7  --   --   MONOABS  --  0.9  --   --   EOSABS  --  0.0  --   --   BASOSABS  --  0.0  --   --     Recent Labs  Lab 10/14/23 1038 10/14/23 1048 10/14/23 1254 10/15/23 0605 10/15/23 0606 10/16/23 0542 10/17/23 0459  NA 136  --   --  137  --  131* 134*  K 3.3*  --   --  3.9  --  3.6 3.7  CL 104  --   --  104  --  102 104  CO2 22  --   --  21*  --  23 20*  GLUCOSE 148*  --   --  83  --  111* 89  BUN 25*  --   --  28*  --  24*  19  CREATININE 1.38*  --   --  1.40*  --  1.11* 1.11*  CALCIUM 8.1*  --   --  8.1*  --  7.7* 7.7*  AST 28  --   --  29  --  25  --   ALT 15  --   --  18  --  18  --   ALKPHOS 43  --   --  43  --  42  --   BILITOT 0.5  --   --  0.6  --  0.3  --   ALBUMIN 3.1*  --   --  2.8*  --  2.4*  --   MG  --   --   --  1.8  --  1.8 1.8  LATICACIDVEN  --  1.8 0.5  --   --   --   --   TSH  --   --   --   --  0.819  --   --     ------------------------------------------------------------------------------------------------------------------ No results for input(s): "CHOL", "HDL", "LDLCALC", "TRIG", "CHOLHDL", "LDLDIRECT" in the last 72 hours.  No results found for: "HGBA1C" ------------------------------------------------------------------------------------------------------------------ No results for input(s): "TSH", "T4TOTAL", "T3FREE", "THYROIDAB" in the last 72 hours.  Invalid input(s): "FREET3"   Cardiac Enzymes No results for input(s): "CKMB", "TROPONINI", "MYOGLOBIN" in the last 168 hours.  Invalid input(s): "CK" ------------------------------------------------------------------------------------------------------------------ No results found for: "BNP"  CBG: Recent Labs  Lab 10/14/23 1059  GLUCAP 120*    Recent Results (from the past 240 hours)  Culture, blood (routine x 2)     Status: Abnormal   Collection Time: 10/14/23 11:25 AM   Specimen: BLOOD  Result Value Ref Range Status   Specimen Description   Final    BLOOD LEFT ANTECUBITAL Performed at [facility], [address] [place], [place] [address]    Special Requests   Final    BOTTLES DRAWN AEROBIC AND ANAEROBIC Blood Culture adequate volume Performed at [facility], [address], [place], [place] [address]    Culture  Setup Time   Final    GRAM POSITIVE COCCI IN CLUSTERS AEROBIC BOTTLE ONLY CRITICAL RESULT CALLED TO, READ BACK BY AND VERIFIED WITH: PHARMD NATHAN BATCHELDER  ON 10/15/23 @ 1353 BY DRT    Culture (A)  Final    STAPHYLOCOCCUS EPIDERMIDIS THE SIGNIFICANCE OF ISOLATING THIS ORGANISM FROM A SINGLE SET OF BLOOD CULTURES WHEN MULTIPLE SETS ARE DRAWN IS UNCERTAIN. PLEASE NOTIFY THE MICROBIOLOGY DEPARTMENT WITHIN ONE WEEK IF SPECIATION AND SENSITIVITIES ARE REQUIRED. Performed at [facility], [address] [place], [place] [address]    Report Status 10/16/2023 FINAL  Final  Culture, blood (routine x 2)     Status: Abnormal (Preliminary result)   Collection Time: 10/14/23 11:25 AM   Specimen: BLOOD  Result Value Ref Range Status   Specimen Description   Final    BLOOD BLOOD RIGHT ARM Performed at [facility], [address], [place], [place] [address]    Special Requests   Final    BOTTLES DRAWN AEROBIC AND ANAEROBIC Blood Culture adequate volume Performed at [facility], [address] [place], [place] [address]    Culture  Setup Time   Final    GRAM POSITIVE COCCI IN CHAINS ANAEROBIC BOTTLE ONLY CRITICAL RESULT CALLED TO, READ BACK BY AND VERIFIED WITH: PHARMD JUSTIN LEGGE ON 10/16/23 @ 1336 BY DRT    Culture (A)  Final    ENTEROCOCCUS FAECALIS SUSCEPTIBILITIES TO FOLLOW Performed at [facility], [address] [place], [place] [address]    Report Status PENDING  Incomplete  Blood Culture ID Panel (Reflexed)     Status: Abnormal   Collection Time: 10/14/23 11:25 AM  Result Value Ref Range Status   Enterococcus faecalis NOT  DETECTED NOT DETECTED Final   Enterococcus Faecium NOT DETECTED NOT DETECTED Final   Listeria monocytogenes NOT DETECTED NOT DETECTED Final   Staphylococcus species DETECTED (A) NOT DETECTED Final    Comment: CRITICAL RESULT CALLED TO, READ BACK BY AND VERIFIED WITH: PHARMD NATHAN BATCHELDER ON 10/15/23 @ 1353 BY DRT    Staphylococcus aureus (BCID) NOT DETECTED NOT DETECTED Final   Staphylococcus epidermidis DETECTED (A) NOT DETECTED Final    Comment: CRITICAL  RESULT CALLED TO, READ BACK BY AND VERIFIED WITH: PHARMD NATHAN BATCHELDER ON 10/15/23 @ 1353 BY DRT    Staphylococcus lugdunensis NOT DETECTED NOT DETECTED Final   Streptococcus species NOT DETECTED NOT DETECTED Final   Streptococcus agalactiae NOT DETECTED NOT DETECTED Final   Streptococcus pneumoniae NOT DETECTED NOT DETECTED Final   Streptococcus pyogenes NOT DETECTED NOT DETECTED Final   A.calcoaceticus-baumannii NOT DETECTED NOT DETECTED Final   Bacteroides fragilis NOT DETECTED NOT DETECTED Final   Enterobacterales NOT DETECTED NOT DETECTED Final   Enterobacter cloacae complex NOT DETECTED NOT DETECTED Final   Escherichia coli NOT DETECTED NOT DETECTED Final   Klebsiella aerogenes NOT DETECTED NOT DETECTED Final   Klebsiella oxytoca NOT DETECTED NOT DETECTED Final   Klebsiella pneumoniae NOT DETECTED NOT DETECTED Final   Proteus species NOT DETECTED NOT DETECTED Final   Salmonella species NOT DETECTED NOT DETECTED Final   Serratia marcescens NOT DETECTED NOT DETECTED Final   Haemophilus influenzae NOT DETECTED NOT DETECTED Final   Neisseria meningitidis NOT DETECTED NOT DETECTED Final   Pseudomonas aeruginosa NOT DETECTED NOT DETECTED Final   Stenotrophomonas maltophilia NOT DETECTED NOT DETECTED Final   Candida albicans NOT DETECTED NOT DETECTED Final   Candida auris NOT DETECTED NOT DETECTED Final   Candida glabrata NOT DETECTED NOT DETECTED Final   Candida krusei NOT DETECTED NOT DETECTED Final   Candida parapsilosis NOT DETECTED NOT DETECTED Final   Candida tropicalis NOT DETECTED NOT DETECTED Final   Cryptococcus neoformans/gattii NOT DETECTED NOT DETECTED Final   Methicillin resistance mecA/C NOT DETECTED NOT DETECTED Final    Comment: Performed at [facility], [address], [place], [place] [address]  Blood Culture ID Panel (Reflexed)     Status: Abnormal   Collection Time: 10/14/23 11:25 AM  Result Value Ref Range Status   Enterococcus faecalis DETECTED (A)  NOT DETECTED Final    Comment: CRITICAL RESULT CALLED TO, READ BACK BY AND VERIFIED WITH: PHARMD JUSTIN LEGGE ON 10/16/23 @ 1336 BY DRT    Enterococcus Faecium NOT DETECTED NOT DETECTED Final   Listeria monocytogenes NOT DETECTED NOT DETECTED Final   Staphylococcus species NOT DETECTED NOT DETECTED Final   Staphylococcus aureus (BCID) NOT DETECTED NOT DETECTED Final   Staphylococcus epidermidis NOT DETECTED NOT DETECTED Final   Staphylococcus lugdunensis NOT DETECTED NOT DETECTED Final   Streptococcus species NOT DETECTED NOT DETECTED Final   Streptococcus agalactiae NOT DETECTED NOT DETECTED Final   Streptococcus pneumoniae NOT DETECTED NOT DETECTED Final   Streptococcus pyogenes NOT DETECTED NOT DETECTED Final   A.calcoaceticus-baumannii NOT DETECTED NOT DETECTED Final   Bacteroides fragilis NOT DETECTED NOT DETECTED Final   Enterobacterales NOT DETECTED NOT DETECTED Final   Enterobacter cloacae complex NOT DETECTED NOT DETECTED Final   Escherichia coli NOT DETECTED NOT DETECTED Final   Klebsiella aerogenes NOT DETECTED NOT DETECTED Final   Klebsiella oxytoca NOT DETECTED NOT DETECTED Final   Klebsiella pneumoniae NOT DETECTED NOT DETECTED Final   Proteus species NOT DETECTED NOT DETECTED Final  Salmonella species NOT DETECTED NOT DETECTED Final   Serratia marcescens NOT DETECTED NOT DETECTED Final   Haemophilus influenzae NOT DETECTED NOT DETECTED Final   Neisseria meningitidis NOT DETECTED NOT DETECTED Final   Pseudomonas aeruginosa NOT DETECTED NOT DETECTED Final   Stenotrophomonas maltophilia NOT DETECTED NOT DETECTED Final   Candida albicans NOT DETECTED NOT DETECTED Final   Candida auris NOT DETECTED NOT DETECTED Final   Candida glabrata NOT DETECTED NOT DETECTED Final   Candida krusei NOT DETECTED NOT DETECTED Final   Candida parapsilosis NOT DETECTED NOT DETECTED Final   Candida tropicalis NOT DETECTED NOT DETECTED Final   Cryptococcus neoformans/gattii NOT DETECTED NOT  DETECTED Final   Vancomycin resistance NOT DETECTED NOT DETECTED Final    Comment: Performed at [facility], [address], [place], [place] [address]  Urine Culture     Status: Abnormal   Collection Time: 10/14/23 12:00 PM   Specimen: Urine, Random  Result Value Ref Range Status   Specimen Description   Final    URINE, RANDOM Performed at [facility], [address] [place], [place] [address]    Special Requests   Final    NONE Reflexed from [id number] Performed at [facility], [address], [place], [place] [address]    Culture 30,000 COLONIES/mL ENTEROCOCCUS FAECALIS (A)  Final   Report Status 10/16/2023 FINAL  Final   Organism ID, Bacteria ENTEROCOCCUS FAECALIS (A)  Final      Susceptibility   Enterococcus faecalis - MIC*    AMPICILLIN <=2 SENSITIVE Sensitive     NITROFURANTOIN <=16 SENSITIVE Sensitive     VANCOMYCIN 1 SENSITIVE Sensitive     * 30,000 COLONIES/mL ENTEROCOCCUS FAECALIS  Culture, blood (Routine X 2) w Reflex to ID Panel     Status: None (Preliminary result)   Collection Time: 10/16/23  9:18 AM   Specimen: BLOOD RIGHT ARM  Result Value Ref Range Status   Specimen Description   Final    BLOOD RIGHT ARM Performed at [facility], [address] [place], [place] [address]    Special Requests   Final    BOTTLES DRAWN AEROBIC ONLY Blood Culture results may not be optimal due to an inadequate volume of blood received in culture bottles Performed at [facility], [address], [place], [place] [address]    Culture   Final    NO GROWTH 2 DAYS Performed at [facility], [address], [place], [place] [address]    Report Status PENDING  Incomplete  Culture, blood (Routine X 2) w Reflex to ID Panel     Status: None (Preliminary result)   Collection Time: 10/16/23  9:23 AM   Specimen: BLOOD RIGHT HAND  Result Value Ref Range Status   Specimen Description   Final    BLOOD RIGHT  HAND Performed at [facility], [address] [place], [place] [address]    Special Requests   Final    BOTTLES DRAWN AEROBIC ONLY Blood Culture results may not be optimal due to an inadequate volume of blood received in culture bottles Performed at [facility], [address] [place], [place] [address]    Culture   Final    NO GROWTH 2 DAYS Performed at [facility], [address], [place], [place] [address]    Report Status PENDING  Incomplete     Radiology Studies: ECHO TEE Result Date: 10/17/2023  TRANSESOPHOGEAL ECHO REPORT   Patient Name:   LUCIENNE SAWYERS Date of Exam: 10/17/2023 Medical Rec #:  [id number]       Height:       59.0 in Accession #:    [id number]      Weight:       98.0 lb Date of Birth:  Mar 30, 1941       BSA:          1.362 m Patient Age:    83 years        BP:           105/62 mmHg Patient Gender: F               HR:           94 bpm. Exam Location:  Inpatient Procedure: Cardiac Doppler, Color Doppler and Transesophageal Echo (Both            Spectral and Color Flow Doppler were utilized during procedure). Indications:     Bacteremia  History:         Patient has prior history of Echocardiogram examinations, most                  recent 10/17/2023.  Sonographer:     Harriette Bouillon RDCS Referring Phys:  [id number] HAO MENG Diagnosing Phys: [name] MD PROCEDURE: The transesophogeal probe was passed without difficulty through the esophogus of the patient. Sedation performed by different physician. The patient was monitored while under deep sedation. Anesthestetic sedation was provided intravenously by Anesthesiology: 60mg  of Propofol. The patient developed no complications during the procedure.  IMPRESSIONS  1. No vegetations.  2. Left ventricular ejection fraction, by estimation, is 55 to 60%. The left ventricle has normal function. The left ventricle has no regional wall motion abnormalities.  3. Right ventricular systolic function is  normal. The right ventricular size is normal.  4. No left atrial/left atrial appendage thrombus was detected.  5. The mitral valve is normal in structure. Mild mitral valve regurgitation.  6. The aortic valve is tricuspid. Aortic valve regurgitation is not visualized. FINDINGS  Left Ventricle: Left ventricular ejection fraction, by estimation, is 55 to 60%. The left ventricle has normal function. The left ventricle has no regional wall motion abnormalities. The left ventricular internal cavity size was normal in size. Right Ventricle: The right ventricular size is normal. Right ventricular systolic function is normal. Left Atrium: Left atrial size was normal in size. No left atrial/left atrial appendage thrombus was detected. Right Atrium: Right atrial size was normal in size. Pericardium: Trivial pericardial effusion is present. Mitral Valve: The mitral valve is normal in structure. Mild mitral valve regurgitation. Tricuspid Valve: The tricuspid valve is normal in structure. Tricuspid valve regurgitation is mild. Aortic Valve: The aortic valve is tricuspid. Aortic valve regurgitation is not visualized. Pulmonic Valve: The pulmonic valve was normal in structure. Pulmonic valve regurgitation is trivial. Aorta: The aortic root is normal in size and structure. IAS/Shunts: No atrial level shunt detected by color flow Doppler. Additional Comments: No vegetations.  AORTA Ao Root diam: 2.70 cm Ao Asc diam:  2.70 cm [name] MD Electronically signed by [name] MD Signature Date/Time: 10/17/2023/2:38:09 PM    Final    EP STUDY Result Date: 10/17/2023 See surgical note for result.     Pamella Pert, MD, PhD [facility]  Between 7 am - 7 pm I am available, please contact me via Amion (for emergencies) or Securechat (non urgent messages)  Between 7 pm - 7 am I am not available, please contact night coverage MD/APP via Amion

## 2023-10-18 NOTE — Plan of Care (Signed)

## 2023-10-19 DIAGNOSIS — N3001 Acute cystitis with hematuria: Secondary | ICD-10-CM | POA: Diagnosis not present

## 2023-10-19 LAB — CREATININE, SERUM
Creatinine, Ser: 1.17 mg/dL — ABNORMAL HIGH (ref 0.44–1.00)
GFR, Estimated: 46 mL/min — ABNORMAL LOW (ref >60)

## 2023-10-19 LAB — CULTURE, BLOOD (ROUTINE X 2): Special Requests: ADEQUATE

## 2023-10-19 MED ORDER — NICOTINE 21 MG/24HR TD PT24
21.0000 mg | MEDICATED_PATCH | Freq: Every day | TRANSDERMAL | Status: DC
  Administered 2023-10-19 – 2023-10-20 (×2): 21 mg via TRANSDERMAL
  Filled 2023-10-19 (×2): qty 1

## 2023-10-19 NOTE — Plan of Care (Signed)

## 2023-10-19 NOTE — TOC Progression Note (Signed)
 Transition of Care [facility]) - Progression Note    Patient Details  Name: [name] MRN: [id number] Date of Birth: [birth year]  [facility] CM/SW Contact  Diona Browner, [place] Phone Number: 10/19/2023, 3:12 PM  Clinical Narrative:    CSW reviewed bed offers with pt and pt spouse. Bed choice is [facility]. CSW notified Kia at [facility]. Ins auth started, pending approval.    Expected Discharge Plan: Skilled Nursing Facility Barriers to Discharge: Continued Medical Work up  Expected Discharge Plan and Services In-house Referral: Clinical Social Work Discharge Planning Services: NA Post Acute Care Choice: Skilled Nursing Facility Living arrangements for the past 2 months: Single Family Home                                       Social Determinants of Health (SDOH) Interventions SDOH Screenings   Food Insecurity: Low Risk  (09/25/2023)   Received from [facility]  Housing: Low Risk  (09/25/2023)   Received from [facility]  Transportation Needs: No Transportation Needs (09/25/2023)   Received from [facility]  Utilities: Low Risk  (09/25/2023)   Received from [facility]  Tobacco Use: High Risk (10/17/2023)    Readmission Risk Interventions    10/16/2023    1:47 PM  Readmission Risk Prevention Plan  Transportation Screening Complete  PCP or Specialist Appt within 5-7 Days Complete  Home Care Screening Complete  Medication Review (RN CM) Complete

## 2023-10-19 NOTE — Progress Notes (Signed)
 Occupational Therapy Treatment Patient Details Name: [name] MRN: [id number] DOB: Mar 29, 1941 Today's Date: 10/19/2023   History of present illness Patient is a 83 year old female who presented on 3/25 with history of 2 day prior fall with hitting head on dumpster and AMS. Patient was admitted with UTI, dehydration and AKI. PMH: vascular dementia, over active bladder, TIA, hearing loss.   OT comments  Patient was noted to improve standing and transfers compared to evaluation with no buckling of knees. Patient remains needing to have one UE support to maintain balance while standing with poor safety awareness and impulsiveness impacting participation in toileting hygiene tasks in standing. Patient will benefit from continued inpatient follow up therapy, <3 hours/day. Patient's discharge plan remains appropriate at this time. OT will continue to follow acutely.        If plan is discharge home, recommend the following:  A lot of help with bathing/dressing/bathroom;Assistance with cooking/housework;Direct supervision/assist for medications management;Assist for transportation;A lot of help with walking and/or transfers;Direct supervision/assist for financial management;Help with stairs or ramp for entrance   Equipment Recommendations  None recommended by OT       Precautions / Restrictions Precautions Precautions: Fall Recall of Precautions/Restrictions: Intact Restrictions Weight Bearing Restrictions Per Provider Order: No       Mobility Bed Mobility       Sidelying to sit: Min assist, HOB elevated       General bed mobility comments: with use of bed rails.         Balance Overall balance assessment: Needs assistance Sitting-balance support: Feet unsupported, Bilateral upper extremity supported Sitting balance-Leahy Scale: Fair     Standing balance support: Bilateral upper extremity supported, During functional activity, Reliant on assistive device for  balance Standing balance-Leahy Scale: Poor           ADL either performed or assessed with clinical judgement   ADL Overall ADL's : Needs assistance/impaired         Upper Body Bathing: Sitting;Minimal assistance       Upper Body Dressing : Sitting;Minimal assistance Upper Body Dressing Details (indicate cue type and reason): sitting in recliner.     Toilet Transfer: Minimal assistance;Ambulation;Rolling walker (2 wheels) Toilet Transfer Details (indicate cue type and reason): to recliner in room with increased time. Toileting- Clothing Manipulation and Hygiene: Sit to/from stand;Maximal assistance Toileting - Clothing Manipulation Details (indicate cue type and reason): patient was noted to have had incontinence of urine with increased smell with attempts to stand. pad and mesh underwear were soiled with max A for hygiene with patient having difficulty with participating in task and maintaining balance even with cues to complete one hand at a time.              Cognition Arousal: Lethargic Behavior During Therapy: Flat affect Cognition: No family/caregiver present to determine baseline             OT - Cognition Comments: patient remains confused but plesant and cooperative during session.                                        Pertinent Vitals/ Pain       Pain Assessment Pain Assessment: No/denies pain         Frequency  Min 1X/week        Progress Toward Goals  OT Goals(current goals can now be found in the  care plan section)  Progress towards OT goals: Progressing toward goals     Plan         AM-PAC OT "6 Clicks" Daily Activity     Outcome Measure   Help from another person eating meals?: A Little Help from another person taking care of personal grooming?: A Little Help from another person toileting, which includes using toliet, bedpan, or urinal?: A Lot Help from another person bathing (including washing, rinsing,  drying)?: A Lot Help from another person to put on and taking off regular upper body clothing?: A Little Help from another person to put on and taking off regular lower body clothing?: A Lot 6 Click Score: 15    End of Session Equipment Utilized During Treatment: Gait belt;Rolling walker (2 wheels)  OT Visit Diagnosis: Unsteadiness on feet (R26.81);Other abnormalities of gait and mobility (R26.89);History of falling (Z91.81);Muscle weakness (generalized) (M62.81)   Activity Tolerance Patient tolerated treatment well   Patient Left     Nurse Communication Mobility status        Time: 1610-9604 OT Time Calculation (min): 15 min  Charges: OT General Charges $OT Visit: 1 Visit OT Treatments $Self Care/Home Management : 8-22 mins  Rosalio Loud, MS Acute Rehabilitation Department Office# [phone]   Selinda Flavin 10/19/2023, 1:05 PM

## 2023-10-19 NOTE — TOC CM/SW Note (Signed)
 CMS list of facilities and star ratings provided to pt to review for facility preference.       [facility] [address] [place], [place] [address] [phone] Overall rating ??  Below average  [facility] at [facility] [address] [place], [place] [address] [phone] Overall rating ? Below average  [facility] [address] [place], [place] [address] [phone] Overall rating? Below average  [facility] [address] [place], [place] [address] [phone] Overall rating ? Much below average  [facility] [address] [place], [place] [address] [phone] Overall rating ???? Average  [facility] [address] [place], [place] [address] [phone] Overall rating ? Much below average   [facility] [address] [place], [place] [address] [phone] Overall rating ?? Much below average  [employer] [facility] [employer] [address] [place], [place] [address] [phone] Overall rating ??? Average  [facility] [address] [place], [place] [address] [phone] Overall rating ? Much below average  [facility] [address] [place], [place] [address] [phone] Overall rating ?? Much below average  [facility] [address] [place], [place] [address] [phone] Overall rating ??? Above average  [facility] [address] [place], [place] [address] [phone] Overall rating ????? Much above average  [facility] [address] [place], [place] [address] [phone] Overall rating ???? Above average  [facility] [address] [place], [place]  [address] [phone] Overall rating ????? Much above average  [facility] [address] [place], [place] [address] [phone] Overall rating ????  [facility] [address] [place], [place] [address] [phone] Overall rating ???? Much above average  [facility] [address] [place], [place] [address] [phone] Overall rating ????? Much above average  [facility] [address] [place], [place] [address] [phone] Overall rating ? Much below average  [facility] [address] [place], [place] [address] [phone] Overall rating ??? Average  [facility] [address] [place], [place] [address] [phone] Overall rating ????? Much above average  [facility] [address] [place], [place] [address] [phone] Overall rating ??? Average  [facility] [address] [place], [place] [address] [phone] Overall rating ?? Below average  [facility] [address] [place], [place] [address] [phone] Overall rating ??? Average  [facility] [address] [place], [place] [address] [phone] Overall rating ????? Much above average  [facility] [address] [place], [place] [address] [phone] Overall rating ? Much below average  [facility]	[place] [facility], [place] [address] [phone] Overall rating ????? Much above average  [facility] [address] [place], [place] [address] [phone] Overall rating ??? Much below average  [facility] [address] [place], [place] [address] [phone] Overall rating ??? Average  [facility]  [facility] [address] [place], [place] [address] [phone] Overall rating ??  Below average  [facility] [address] [place], [place] [address] [phone] Overall rating ? Much below average  [facility] [address] [place], [place] [address] [phone] Overall rating ??? Average  [facility] [address] [place], [place] [address] [phone] Overall rating ? Much below average  [facility] [address] [place], [place] [address] [phone] Overall rating ??? Average  [facility] [address] [place], [place] [address] [phone] Overall rating ? Much below average  [place] [address] [place], [place] [address] [phone] Overall rating ????? Much above average  [facility] [address] [place], [place] [address] [phone] Overall rating ? Much below average  [facility] [address] [place], [place] [address] [phone] Overall rating ???? Above average  [facility] [address] [place], [place] [address] [phone] Overall rating ????? Above average  [facility] [address] [place], [place] [address] [phone] Overall rating ???? Above average  [facility] [address] [place], [place] [address] [phone] Overall rating ????? Much above average  [facility] [address] [place], [place] [address] [phone] Overall rating ? Much below average  [facility] [address] [place], [place] [address] [phone] Overall rating ????? Much above average  [facility] [address] [place], [place] [address] [phone] Overall rating ? Much below  average  [facility] [address] [place], [place] [address] [phone] Overall rating ????? Much above average  [facility] [address] [place], [place] [address] [phone] Overall rating ????? Much above average  [facility] [address] [place], [place] [address] [phone] Overall rating ??? Average  [facility] [address] [place], [place] [address] [phone] Overall rating ??? Average  [facility] [address] [place], [place] [address] [phone] Overall rating ???? Above average  [facility] [address] [place], [place] [address] [phone] Overall rating ?? Below average  [facility] [address] [place], [place] [address] [phone] Overall rating ???? Above average  [facility] [address] [place], [place] [address] [phone] Overall rating ?? Below average  [facility] [address] [place], [place] [address] [phone] Overall rating ??

## 2023-10-19 NOTE — Progress Notes (Signed)
 PROGRESS NOTE  [name] NGE:[id number] DOB: October 19, 1940 DOA: 10/14/2023 PCP: Gwenyth Bender, FNP   LOS: 5 days   Brief Narrative / Interim history: 83 year old female with dementia, prior TIA, overactive bladder with incontinence comes into the hospital with increased confusion after having a fall in the backyard 2 days prior to admission.  She was out throwing something into the dumpster, and then slipped and fell hit her head.  Recovered initially, however has been having slightly worsened gait and increased confusion, and also felt very weak.  She was also noted to be feverish with a low-grade temp of 99.  She was hypotensive in the ER, found to have AKI and potentially a UTI.  She was placed on antibiotics and admitted to the hospital.  Brain imaging with head CT was unremarkable  Subjective / 24h Interval events: Does not feel well, complains of having blood work done this morning is the main issue  Assesement and Plan: Principal problem Acute metabolic encephalopathy -with increased confusion and gait changes, also with low-grade temperature at home and worsening of her urinary incontinence.  Encephalopathy is probably multifactorial with baseline dementia, recent mechanical fall with trauma to the head as well as UTI and bacteremia.  Treat with antibiotics, supportive care, PT recommends SNF, husband in agreement.  TOC consulted, awaiting placement  Active problems Enterococcus UTI and Enterococcus bacteremia-with leukocytosis, pyuria, worsening urinary incontinence and possible fever.  Due to bacteremia ID consulted.  Surveillance cultures no growth to date.  2D echo as well as TEE did not show any evidence of endocarditis -Currently on IV ampicillin, she can be transitioned to p.o. ampicillin upon discharge for total of 2 weeks.  Remains afebrile and surveillance cultures are without growth at day 3  Underlying dementia-continue home medications  Macrocytic anemia - no  bleeding, hemoglobin stable, continue home B12  Scheduled Meds:  aspirin EC  81 mg Oral Daily   cyanocobalamin  1,000 mcg Oral Daily   donepezil  10 mg Oral q AM   enoxaparin (LOVENOX) injection  30 mg Subcutaneous Q24H   escitalopram  20 mg Oral q AM   memantine  5 mg Oral Daily   mirabegron ER  25 mg Oral q AM   multivitamin with minerals  1 tablet Oral Daily   nicotine  7 mg Transdermal Daily   polyvinyl alcohol  1 drop Both Eyes q AM   Continuous Infusions:  ampicillin (OMNIPEN) IV 2 g (10/19/23 0506)   PRN Meds:.acetaminophen, ALPRAZolam  Current Outpatient Medications  Medication Instructions   ALPRAZolam (XANAX) 0.25 mg, Daily PRN   aspirin EC 81 mg, Daily   BLINK TEARS 0.25 % SOLN 1 drop, Every morning   Calcium-Vitamin D-Vitamin K 650-12.5-40 MG-MCG-MCG CHEW 1 tablet, Daily   cyanocobalamin (VITAMIN B12) 1,000 mcg, Daily   donepezil (ARICEPT) 10 mg, Oral, Every morning   escitalopram (LEXAPRO) 20 mg, Every morning   fexofenadine (ALLEGRA) 60 MG tablet 1-2 times daily if needed   fluticasone (FLONASE) 50 MCG/ACT nasal spray 1-2 sprays per nostril daily if needed   Ibuprofen 200 mg, Oral, Every 6 hours PRN   lidocaine (LIDODERM) 5 % 1 patch, Transdermal, Every 24 hours, Remove & Discard patch within 12 hours or as directed by MD   memantine (NAMENDA) 5 mg, 2 times daily   Multiple Vitamins-Minerals (ONE A DAY WOMEN 50 PLUS PO) 1 tablet, Daily after breakfast   Multiple Vitamins-Minerals (PRESERVISION AREDS 2) CAPS 1 capsule, 2 times daily   Myrbetriq  25 mg, Oral, Every morning   Probiotic Product (PROBIOTIC PO) 1 capsule, Daily   TYLENOL 500 mg, Oral, Every 6 hours PRN    Diet Orders (From admission, onward)     Start     Ordered   10/18/23 1653  DIET DYS 3 Room service appropriate? Yes with Assist; Fluid consistency: Thin  Diet effective now       Question Answer Comment  Room service appropriate? Yes with Assist   Fluid consistency: Thin      10/18/23 1652             DVT prophylaxis: enoxaparin (LOVENOX) injection 30 mg Start: 10/14/23 1730   Lab Results  Component Value Date   PLT 226 10/17/2023      Code Status: Full Code  Family Communication: Husband over the phone  Status is: Inpatient Remains inpatient appropriate because: Severity of illness   Level of care: Telemetry  Consultants:  None  Objective: Vitals:   10/18/23 0421 10/18/23 1143 10/18/23 2036 10/19/23 0554  BP: (!) 109/55 (!) 110/55 (!) 131/59 (!) 119/47  Pulse: 63 72 74 75  Resp: 18 18 16 16   Temp: 98.1 F (36.7 C) 98.2 F (36.8 C) 98.4 F (36.9 C) 98.9 F (37.2 C)  TempSrc: Oral Oral Oral Oral  SpO2: 95% 95% 95% 94%  Weight:      Height:        Intake/Output Summary (Last 24 hours) at 10/19/2023 1013 Last data filed at 10/18/2023 1700 Gross per 24 hour  Intake 360 ml  Output 425 ml  Net -65 ml   Wt Readings from Last 3 Encounters:  10/14/23 44.5 kg  06/20/23 43 kg  06/17/23 43 kg    Examination:  Constitutional: NAD Eyes: lids and conjunctivae normal, no scleral icterus ENMT: mmm Neck: normal, supple Respiratory: clear to auscultation bilaterally, no wheezing, no crackles. Normal respiratory effort.  Cardiovascular: Regular rate and rhythm, no murmurs / rubs / gallops. No LE edema. Abdomen: soft, no distention, no tenderness. Bowel sounds positive.   Data Reviewed: I have independently reviewed following labs and imaging studies   CBC Recent Labs  Lab 10/14/23 1038 10/15/23 0605 10/16/23 0542 10/17/23 0459  WBC 14.1* 13.3* 9.8 10.4  HGB 9.0* 10.5* 9.3* 9.5*  HCT 27.4* 32.7* 28.5* 28.4*  PLT 179 190 200 226  MCV 100.0 101.2* 99.7 99.3  MCH 32.8 32.5 32.5 33.2  MCHC 32.8 32.1 32.6 33.5  RDW 14.3 14.0 13.7 13.5  LYMPHSABS  --  0.7  --   --   MONOABS  --  0.9  --   --   EOSABS  --  0.0  --   --   BASOSABS  --  0.0  --   --     Recent Labs  Lab 10/14/23 1038 10/14/23 1048 10/14/23 1254 10/15/23 0605  10/15/23 0606 10/16/23 0542 10/17/23 0459 10/19/23 0729  NA 136  --   --  137  --  131* 134*  --   K 3.3*  --   --  3.9  --  3.6 3.7  --   CL 104  --   --  104  --  102 104  --   CO2 22  --   --  21*  --  23 20*  --   GLUCOSE 148*  --   --  83  --  111* 89  --   BUN 25*  --   --  28*  --  24* 19  --   CREATININE 1.38*  --   --  1.40*  --  1.11* 1.11* 1.17*  CALCIUM 8.1*  --   --  8.1*  --  7.7* 7.7*  --   AST 28  --   --  29  --  25  --   --   ALT 15  --   --  18  --  18  --   --   ALKPHOS 43  --   --  43  --  42  --   --   BILITOT 0.5  --   --  0.6  --  0.3  --   --   ALBUMIN 3.1*  --   --  2.8*  --  2.4*  --   --   MG  --   --   --  1.8  --  1.8 1.8  --   LATICACIDVEN  --  1.8 0.5  --   --   --   --   --   TSH  --   --   --   --  0.819  --   --   --     ------------------------------------------------------------------------------------------------------------------ No results for input(s): "CHOL", "HDL", "LDLCALC", "TRIG", "CHOLHDL", "LDLDIRECT" in the last 72 hours.  No results found for: "HGBA1C" ------------------------------------------------------------------------------------------------------------------ No results for input(s): "TSH", "T4TOTAL", "T3FREE", "THYROIDAB" in the last 72 hours.  Invalid input(s): "FREET3"   Cardiac Enzymes No results for input(s): "CKMB", "TROPONINI", "MYOGLOBIN" in the last 168 hours.  Invalid input(s): "CK" ------------------------------------------------------------------------------------------------------------------ No results found for: "BNP"  CBG: Recent Labs  Lab 10/14/23 1059  GLUCAP 120*    Recent Results (from the past 240 hours)  Culture, blood (routine x 2)     Status: Abnormal   Collection Time: 10/14/23 11:25 AM   Specimen: BLOOD  Result Value Ref Range Status   Specimen Description   Final    BLOOD LEFT ANTECUBITAL Performed at [facility], [address] [place], [place] [address]     Special Requests   Final    BOTTLES DRAWN AEROBIC AND ANAEROBIC Blood Culture adequate volume Performed at [facility], [address] [place], [place] [address]    Culture  Setup Time   Final    GRAM POSITIVE COCCI IN CLUSTERS AEROBIC BOTTLE ONLY CRITICAL RESULT CALLED TO, READ BACK BY AND VERIFIED WITH: PHARMD NATHAN BATCHELDER ON 10/15/23 @ 1353 BY DRT    Culture (A)  Final    STAPHYLOCOCCUS EPIDERMIDIS THE SIGNIFICANCE OF ISOLATING THIS ORGANISM FROM A SINGLE SET OF BLOOD CULTURES WHEN MULTIPLE SETS ARE DRAWN IS UNCERTAIN. PLEASE NOTIFY THE MICROBIOLOGY DEPARTMENT WITHIN ONE WEEK IF SPECIATION AND SENSITIVITIES ARE REQUIRED. Performed at [facility], [address] [place], [place] [address]    Report Status 10/16/2023 FINAL  Final  Culture, blood (routine x 2)     Status: Abnormal   Collection Time: 10/14/23 11:25 AM   Specimen: BLOOD  Result Value Ref Range Status   Specimen Description   Final    BLOOD BLOOD RIGHT ARM Performed at [facility], [address] [place], [place] [address]    Special Requests   Final    BOTTLES DRAWN AEROBIC AND ANAEROBIC Blood Culture adequate volume Performed at [facility], [address] [place], [place] [address]    Culture  Setup Time   Final    GRAM POSITIVE COCCI IN CHAINS ANAEROBIC  BOTTLE ONLY CRITICAL RESULT CALLED TO, READ BACK BY AND VERIFIED WITH: PHARMD JUSTIN LEGGE ON 10/16/23 @ 1336 BY DRT Performed at [facility], [address], [place], [place] [address]    Culture ENTEROCOCCUS FAECALIS (A)  Final   Report Status 10/19/2023 FINAL  Final   Organism ID, Bacteria ENTEROCOCCUS FAECALIS  Final      Susceptibility   Enterococcus faecalis - MIC*    AMPICILLIN <=2 SENSITIVE Sensitive     VANCOMYCIN 1 SENSITIVE Sensitive     GENTAMICIN SYNERGY SENSITIVE Sensitive     * ENTEROCOCCUS FAECALIS  Blood Culture ID Panel (Reflexed)     Status: Abnormal    Collection Time: 10/14/23 11:25 AM  Result Value Ref Range Status   Enterococcus faecalis NOT DETECTED NOT DETECTED Final   Enterococcus Faecium NOT DETECTED NOT DETECTED Final   Listeria monocytogenes NOT DETECTED NOT DETECTED Final   Staphylococcus species DETECTED (A) NOT DETECTED Final    Comment: CRITICAL RESULT CALLED TO, READ BACK BY AND VERIFIED WITH: PHARMD NATHAN BATCHELDER ON 10/15/23 @ 1353 BY DRT    Staphylococcus aureus (BCID) NOT DETECTED NOT DETECTED Final   Staphylococcus epidermidis DETECTED (A) NOT DETECTED Final    Comment: CRITICAL RESULT CALLED TO, READ BACK BY AND VERIFIED WITH: PHARMD NATHAN BATCHELDER ON 10/15/23 @ 1353 BY DRT    Staphylococcus lugdunensis NOT DETECTED NOT DETECTED Final   Streptococcus species NOT DETECTED NOT DETECTED Final   Streptococcus agalactiae NOT DETECTED NOT DETECTED Final   Streptococcus pneumoniae NOT DETECTED NOT DETECTED Final   Streptococcus pyogenes NOT DETECTED NOT DETECTED Final   A.calcoaceticus-baumannii NOT DETECTED NOT DETECTED Final   Bacteroides fragilis NOT DETECTED NOT DETECTED Final   Enterobacterales NOT DETECTED NOT DETECTED Final   Enterobacter cloacae complex NOT DETECTED NOT DETECTED Final   Escherichia coli NOT DETECTED NOT DETECTED Final   Klebsiella aerogenes NOT DETECTED NOT DETECTED Final   Klebsiella oxytoca NOT DETECTED NOT DETECTED Final   Klebsiella pneumoniae NOT DETECTED NOT DETECTED Final   Proteus species NOT DETECTED NOT DETECTED Final   Salmonella species NOT DETECTED NOT DETECTED Final   Serratia marcescens NOT DETECTED NOT DETECTED Final   Haemophilus influenzae NOT DETECTED NOT DETECTED Final   Neisseria meningitidis NOT DETECTED NOT DETECTED Final   Pseudomonas aeruginosa NOT DETECTED NOT DETECTED Final   Stenotrophomonas maltophilia NOT DETECTED NOT DETECTED Final   Candida albicans NOT DETECTED NOT DETECTED Final   Candida auris NOT DETECTED NOT DETECTED Final   Candida glabrata NOT  DETECTED NOT DETECTED Final   Candida krusei NOT DETECTED NOT DETECTED Final   Candida parapsilosis NOT DETECTED NOT DETECTED Final   Candida tropicalis NOT DETECTED NOT DETECTED Final   Cryptococcus neoformans/gattii NOT DETECTED NOT DETECTED Final   Methicillin resistance mecA/C NOT DETECTED NOT DETECTED Final    Comment: Performed at [facility], [address] [place], [place] [address]  Blood Culture ID Panel (Reflexed)     Status: Abnormal   Collection Time: 10/14/23 11:25 AM  Result Value Ref Range Status   Enterococcus faecalis DETECTED (A) NOT DETECTED Final    Comment: CRITICAL RESULT CALLED TO, READ BACK BY AND VERIFIED WITH: PHARMD JUSTIN LEGGE ON 10/16/23 @ 1336 BY DRT    Enterococcus Faecium NOT DETECTED NOT DETECTED Final   Listeria monocytogenes NOT DETECTED NOT DETECTED Final   Staphylococcus species NOT DETECTED NOT DETECTED Final   Staphylococcus aureus (BCID) NOT DETECTED NOT DETECTED Final   Staphylococcus epidermidis  NOT DETECTED NOT DETECTED Final   Staphylococcus lugdunensis NOT DETECTED NOT DETECTED Final   Streptococcus species NOT DETECTED NOT DETECTED Final   Streptococcus agalactiae NOT DETECTED NOT DETECTED Final   Streptococcus pneumoniae NOT DETECTED NOT DETECTED Final   Streptococcus pyogenes NOT DETECTED NOT DETECTED Final   A.calcoaceticus-baumannii NOT DETECTED NOT DETECTED Final   Bacteroides fragilis NOT DETECTED NOT DETECTED Final   Enterobacterales NOT DETECTED NOT DETECTED Final   Enterobacter cloacae complex NOT DETECTED NOT DETECTED Final   Escherichia coli NOT DETECTED NOT DETECTED Final   Klebsiella aerogenes NOT DETECTED NOT DETECTED Final   Klebsiella oxytoca NOT DETECTED NOT DETECTED Final   Klebsiella pneumoniae NOT DETECTED NOT DETECTED Final   Proteus species NOT DETECTED NOT DETECTED Final   Salmonella species NOT DETECTED NOT DETECTED Final   Serratia marcescens NOT DETECTED NOT DETECTED Final   Haemophilus influenzae NOT  DETECTED NOT DETECTED Final   Neisseria meningitidis NOT DETECTED NOT DETECTED Final   Pseudomonas aeruginosa NOT DETECTED NOT DETECTED Final   Stenotrophomonas maltophilia NOT DETECTED NOT DETECTED Final   Candida albicans NOT DETECTED NOT DETECTED Final   Candida auris NOT DETECTED NOT DETECTED Final   Candida glabrata NOT DETECTED NOT DETECTED Final   Candida krusei NOT DETECTED NOT DETECTED Final   Candida parapsilosis NOT DETECTED NOT DETECTED Final   Candida tropicalis NOT DETECTED NOT DETECTED Final   Cryptococcus neoformans/gattii NOT DETECTED NOT DETECTED Final   Vancomycin resistance NOT DETECTED NOT DETECTED Final    Comment: Performed at [facility], [address], [place], [place] [address]  Urine Culture     Status: Abnormal   Collection Time: 10/14/23 12:00 PM   Specimen: Urine, Random  Result Value Ref Range Status   Specimen Description   Final    URINE, RANDOM Performed at [facility], [address] [place], [place] [address]    Special Requests   Final    NONE Reflexed from [id number] Performed at [facility], [address] [place], [place] [address]    Culture 30,000 COLONIES/mL ENTEROCOCCUS FAECALIS (A)  Final   Report Status 10/16/2023 FINAL  Final   Organism ID, Bacteria ENTEROCOCCUS FAECALIS (A)  Final      Susceptibility   Enterococcus faecalis - MIC*    AMPICILLIN <=2 SENSITIVE Sensitive     NITROFURANTOIN <=16 SENSITIVE Sensitive     VANCOMYCIN 1 SENSITIVE Sensitive     * 30,000 COLONIES/mL ENTEROCOCCUS FAECALIS  Culture, blood (Routine X 2) w Reflex to ID Panel     Status: None (Preliminary result)   Collection Time: 10/16/23  9:18 AM   Specimen: BLOOD RIGHT ARM  Result Value Ref Range Status   Specimen Description   Final    BLOOD RIGHT ARM Performed at [facility], [address], [place], [place] [address]    Special Requests   Final    BOTTLES DRAWN AEROBIC ONLY Blood Culture results may  not be optimal due to an inadequate volume of blood received in culture bottles Performed at [facility], [address] [place], [place] [address]    Culture   Final    NO GROWTH 3 DAYS Performed at [facility], [address], [place], [place] [address]    Report Status PENDING  Incomplete  Culture, blood (Routine X 2) w Reflex to ID Panel     Status: None (Preliminary result)   Collection Time: 10/16/23  9:23  AM   Specimen: BLOOD RIGHT HAND  Result Value Ref Range Status   Specimen Description   Final    BLOOD RIGHT HAND Performed at [facility], [address], [place], [place] [address]    Special Requests   Final    BOTTLES DRAWN AEROBIC ONLY Blood Culture results may not be optimal due to an inadequate volume of blood received in culture bottles Performed at [facility], [address] [place], [place] [address]    Culture   Final    NO GROWTH 3 DAYS Performed at [facility], [address], [place], [place] [address]    Report Status PENDING  Incomplete     Radiology Studies: No results found.     Pamella Pert, MD, PhD [facility]  Between 7 am - 7 pm I am available, please contact me via Amion (for emergencies) or Securechat (non urgent messages)  Between 7 pm - 7 am I am not available, please contact night coverage MD/APP via Amion

## 2023-10-20 DIAGNOSIS — N3001 Acute cystitis with hematuria: Secondary | ICD-10-CM | POA: Diagnosis not present

## 2023-10-20 MED ORDER — ALPRAZOLAM 0.25 MG PO TABS: 0.2500 mg | ORAL_TABLET | Freq: Every day | ORAL | 0 refills | Status: DC | PRN

## 2023-10-20 MED ORDER — AMOXICILLIN 500 MG PO CAPS
1000.0000 mg | ORAL_CAPSULE | Freq: Three times a day (TID) | ORAL | Status: AC
Start: 2023-10-20 — End: 2023-10-30

## 2023-10-20 MED ORDER — NICOTINE 21 MG/24HR TD PT24: 21.0000 mg | MEDICATED_PATCH | Freq: Every day | TRANSDERMAL | Status: DC

## 2023-10-20 NOTE — TOC Transition Note (Signed)
 Transition of Care [facility]) - Discharge Note   Patient Details  Name: [name] MRN: [id number] Date of Birth: 04-22-1941  Transition of [facility]) CM/SW Contact:  Otelia Santee, LCSW Phone Number: 10/20/2023, 10:54 AM   Clinical Narrative:    Insurance auth approved for SNF. Pt able to transfer to [facility] today. Pt will be going to room 117. RN to call report to [phone]. Spoke with pt's spouse via t/c and confirmed DC plans. DC packet with signed script placed at RN station. PTAR called at 10:36am for transportation. RN notified.    Final next level of care: Skilled Nursing Facility Barriers to Discharge: Barriers Resolved   Patient Goals and CMS Choice Patient states their goals for this hospitalization and ongoing recovery are:: For pt to go to rehab CMS Medicare.gov Compare Post Acute Care list provided to:: Patient Represenative (must comment) Choice offered to / list presented to : Spouse [place] ownership interest in [facility].provided to:: Spouse    Discharge Placement PASRR number recieved: 10/17/23            Patient chooses bed at: [facility] Patient to be transferred to facility by: PTAR Name of family member notified: Spouse Patient and family notified of of transfer: 10/20/23  Discharge Plan and Services Additional resources added to the After Visit Summary for   In-house Referral: Clinical Social Work Discharge Planning Services: NA Post Acute Care Choice: Skilled Nursing Facility          DME Arranged: N/A DME Agency: NA                  Social Drivers of Health (SDOH) Interventions SDOH Screenings   Food Insecurity: Low Risk  (09/25/2023)   Received from [facility]  Housing: Low Risk  (09/25/2023)   Received from [facility]  Transportation Needs: No Transportation Needs (09/25/2023)   Received from [facility]  Utilities: Low Risk  (09/25/2023)   Received from [facility]  Tobacco Use: High Risk  (10/17/2023)     Readmission Risk Interventions    10/20/2023   10:53 AM 10/16/2023    1:47 PM  Readmission Risk Prevention Plan  Post Dischage Appt Complete   Medication Screening Complete   Transportation Screening Complete Complete  PCP or Specialist Appt within 5-7 Days  Complete  Home Care Screening  Complete  Medication Review (RN CM)  Complete

## 2023-10-20 NOTE — Anesthesia Postprocedure Evaluation (Signed)
 Anesthesia Post Note  Patient: [name]  Procedure(s) Performed: TRANSESOPHAGEAL ECHOCARDIOGRAM     Patient location during evaluation: Cath Lab Anesthesia Type: MAC Level of consciousness: awake and alert Pain management: pain level controlled Vital Signs Assessment: post-procedure vital signs reviewed and stable Respiratory status: spontaneous breathing, nonlabored ventilation, respiratory function stable and patient connected to nasal cannula oxygen Cardiovascular status: blood pressure returned to baseline and stable Postop Assessment: no apparent nausea or vomiting Anesthetic complications: no  No notable events documented.  Last Vitals:  Vitals:   10/19/23 2009 10/20/23 0500  BP: 131/64 (!) 118/54  Pulse: 91 92  Resp: 18 18  Temp: 37 C 37.1 C  SpO2: 92% 92%    Last Pain:  Vitals:   10/20/23 0500  TempSrc: Oral  PainSc:                  [name] L Myson Levi

## 2023-10-20 NOTE — Plan of Care (Signed)

## 2023-10-20 NOTE — Discharge Summary (Signed)
 Physician Discharge Summary  [name]:[id number] DOB: [birth year] DOA: 10/14/2023  PCP: Gwenyth Bender, FNP  Admit date: 10/14/2023 Discharge date: 10/20/2023  Admitted From: home Disposition:  SNF  Recommendations for Outpatient Follow-up:  Follow up with PCP in 1-2 weeks  Home Health: none Equipment/Devices: none  Discharge Condition: stable CODE STATUS: Full code Diet Orders (From admission, onward)     Start     Ordered   10/18/23 1653  DIET DYS 3 Room service appropriate? Yes with Assist; Fluid consistency: Thin  Diet effective now       Question Answer Comment  Room service appropriate? Yes with Assist   Fluid consistency: Thin      10/18/23 1652            HPI: Per admitting MD, [name] is a 83 y.o. female with medical history significant of vascular dementia, overactive bladder with incontinence, history of TIA, and hearing loss. History is provided by patient's spouse via telephone Patient spouse states that 2 days prior to admission he and his wife were working in their backyard.  While she was throwing something in a dumpster, she slipped and fell, hitting her head.  She initially seemed to recover.  However, the following day her gait was intermittently abnormal.  She was not as coordinated and took small steps.  Eventually, she required her husband's assistance to move throughout the home.  She discussed with her husband that she felt lousy but did not say specifically what was bothering her.  Patient spouse notes that she went to her bathroom to smoke a cigarette early on the day of admission and she never came back to bed.  When he checked on her, she was sitting with her right hand trembling.  Since hitting her head patient has had a decreased appetite and had worsening urinary incontinence, which had previously been controlled with Myrbetriq.  She also had some confusion and was noted to feel feverish, though she had a temperature of 99 F.     Hospital Course / Discharge diagnoses: Principal problem Sepsis due to Enterococcus UTI and Enterococcus bacteremia-with leukocytosis, pyuria, worsening urinary incontinence and fever of 102.2 on admission.  Urine cultures as well as blood cultures grew Enterococcus, pansensitive.  Infectious disease was consulted and followed patient while hospitalized.  She underwent a 2D echocardiogram as well as a transesophageal echocardiogram, and they did not show any evidence of endocarditis.  She was maintained on IV ampicillin, underwent surveillance cultures on 3/27 and they have remained without growth so far.  ID recommends 2 weeks of antibiotics with a start date of 3/27, end date/10/25, and she will be on ampicillin 1 g Q 8 hours  Active problems Acute metabolic encephalopathy -with increased confusion and gait changes, also with low-grade temperature at home and worsening of her urinary incontinence.  This is due to the BRCA1.  Gradually clearing Underlying dementia-continue home medications Macrocytic anemia - no bleeding, hemoglobin stable, continue home B12  Discharge Instructions  Allergies as of 10/20/2023       Reactions   Sulfa Antibiotics Itching, Rash        Medication List     STOP taking these medications    lidocaine 5 % Commonly known as: Lidoderm       TAKE these medications    ALPRAZolam 0.25 MG tablet Commonly known as: XANAX Take 1 tablet (0.25 mg total) by mouth daily as needed for anxiety.   amoxicillin 500 MG capsule  Commonly known as: AMOXIL Take 2 capsules (1,000 mg total) by mouth 3 (three) times daily for 10 days.   aspirin EC 81 MG tablet Take 81 mg by mouth daily.   Blink Tears 0.25 % Soln Generic drug: Polyethylene Glycol 400 Place 1 drop into both eyes in the morning.   Calcium-Vitamin D-Vitamin K 650-12.5-40 MG-MCG-MCG Chew Chew 1 tablet by mouth daily.   cyanocobalamin 1000 MCG tablet Commonly known as: VITAMIN B12 Take 1,000 mcg by  mouth daily.   donepezil 10 MG tablet Commonly known as: ARICEPT Take 10 mg by mouth in the morning.   fexofenadine 60 MG tablet Commonly known as: ALLEGRA 1-2 times daily if needed   fluticasone 50 MCG/ACT nasal spray Commonly known as: Flonase 1-2 sprays per nostril daily if needed   Ibuprofen 200 MG Caps Take 200 mg by mouth every 6 (six) hours as needed (for pain or headaches).   Lexapro 20 MG tablet Generic drug: escitalopram Take 20 mg by mouth in the morning.   memantine 5 MG tablet Commonly known as: NAMENDA Take 5 mg by mouth in the morning and at bedtime.   Myrbetriq 25 MG Tb24 tablet Generic drug: mirabegron ER Take 25 mg by mouth in the morning.   nicotine 21 mg/24hr patch Commonly known as: NICODERM CQ - dosed in mg/24 hours Place 1 patch (21 mg total) onto the skin daily. Start taking on: October 21, 2023   PreserVision AREDS 2 Caps Take 1 capsule by mouth 2 (two) times daily.   ONE A DAY WOMEN 50 PLUS PO Take 1 tablet by mouth daily after breakfast.   PROBIOTIC PO Take 1 capsule by mouth daily.   TYLENOL 500 MG tablet Generic drug: acetaminophen Take 500 mg by mouth every 6 (six) hours as needed for mild pain (pain score 1-3) or headache.        Contact information for after-discharge care     Destination     [facility] Preferred SNF .   Service: Skilled Nursing Contact information: [address] [place] [address] [phone]                     Consultations: ID  Procedures/Studies:  ECHO TEE Result Date: 10/17/2023    TRANSESOPHOGEAL ECHO REPORT   Patient Name:   [name] Date of Exam: 10/17/2023 Medical Rec #:  [id number]       Height:       59.0 in Accession #:    [id number]      Weight:       98.0 lb Date of Birth:  Dec 01, 1940       BSA:          1.362 m Patient Age:    83 years        BP:           105/62 mmHg Patient Gender: F               HR:           94 bpm. Exam Location:   Inpatient Procedure: Cardiac Doppler, Color Doppler and Transesophageal Echo (Both            Spectral and Color Flow Doppler were utilized during procedure). Indications:     Bacteremia  History:         Patient has prior history of Echocardiogram examinations, most  recent 10/17/2023.  Sonographer:     Harriette Bouillon RDCS Referring Phys:  [id number] HAO MENG Diagnosing Phys: [name] MD PROCEDURE: The transesophogeal probe was passed without difficulty through the esophogus of the patient. Sedation performed by different physician. The patient was monitored while under deep sedation. Anesthestetic sedation was provided intravenously by Anesthesiology: 60mg  of Propofol. The patient developed no complications during the procedure.  IMPRESSIONS  1. No vegetations.  2. Left ventricular ejection fraction, by estimation, is 55 to 60%. The left ventricle has normal function. The left ventricle has no regional wall motion abnormalities.  3. Right ventricular systolic function is normal. The right ventricular size is normal.  4. No left atrial/left atrial appendage thrombus was detected.  5. The mitral valve is normal in structure. Mild mitral valve regurgitation.  6. The aortic valve is tricuspid. Aortic valve regurgitation is not visualized. FINDINGS  Left Ventricle: Left ventricular ejection fraction, by estimation, is 55 to 60%. The left ventricle has normal function. The left ventricle has no regional wall motion abnormalities. The left ventricular internal cavity size was normal in size. Right Ventricle: The right ventricular size is normal. Right ventricular systolic function is normal. Left Atrium: Left atrial size was normal in size. No left atrial/left atrial appendage thrombus was detected. Right Atrium: Right atrial size was normal in size. Pericardium: Trivial pericardial effusion is present. Mitral Valve: The mitral valve is normal in structure. Mild mitral valve regurgitation. Tricuspid  Valve: The tricuspid valve is normal in structure. Tricuspid valve regurgitation is mild. Aortic Valve: The aortic valve is tricuspid. Aortic valve regurgitation is not visualized. Pulmonic Valve: The pulmonic valve was normal in structure. Pulmonic valve regurgitation is trivial. Aorta: The aortic root is normal in size and structure. IAS/Shunts: No atrial level shunt detected by color flow Doppler. Additional Comments: No vegetations.  AORTA Ao Root diam: 2.70 cm Ao Asc diam:  2.70 cm [name] MD Electronically signed by [name] MD Signature Date/Time: 10/17/2023/2:38:09 PM    Final    EP STUDY Result Date: 10/17/2023 See surgical note for result.  ECHOCARDIOGRAM COMPLETE Result Date: 10/17/2023    ECHOCARDIOGRAM REPORT   Patient Name:   VALERYE KOBUS Date of Exam: 10/17/2023 Medical Rec #:  [id number]       Height:       59.0 in Accession #:    [id number]      Weight:       98.0 lb Date of Birth:  06/30/1941       BSA:          1.362 m Patient Age:    83 years        BP:           106/50 mmHg Patient Gender: F               HR:           73 bpm. Exam Location:  Inpatient Procedure: 2D Echo, Cardiac Doppler and Color Doppler (Both Spectral and Color            Flow Doppler were utilized during procedure). Indications:    Bacteremia  History:        Patient has no prior history of Echocardiogram examinations.                 COPD, Signs/Symptoms:Alzheimer's; Risk Factors:Current Smoker.  Sonographer:    Sheralyn Boatman RDCS Referring Phys: [id number] Daylene Katayama [facility] IMPRESSIONS  1. Left ventricular ejection fraction, by estimation,  is 60 to 65%. The left ventricle has normal function. The left ventricle has no regional wall motion abnormalities. Left ventricular diastolic parameters are indeterminate.  2. Right ventricular systolic function is normal. The right ventricular size is normal. There is moderately elevated pulmonary artery systolic pressure. The estimated right ventricular systolic pressure is  50.8 mmHg.  3. Large pleural effusion in the left lateral region.  4. The mitral valve is degenerative. Mild mitral valve regurgitation. No evidence of mitral stenosis.  5. Tricuspid valve regurgitation is mild to moderate.  6. The aortic valve is tricuspid. Aortic valve regurgitation is not visualized. Aortic valve sclerosis is present, with no evidence of aortic valve stenosis.  7. The inferior vena cava is normal in size with <50% respiratory variability, suggesting right atrial pressure of 8 mmHg. Comparison(s): No prior Echocardiogram. Conclusion(s)/Recommendation(s): No evidence of valvular vegetations on this transthoracic echocardiogram. Consider a transesophageal echocardiogram to exclude infective endocarditis if clinically indicated. FINDINGS  Left Ventricle: Left ventricular ejection fraction, by estimation, is 60 to 65%. The left ventricle has normal function. The left ventricle has no regional wall motion abnormalities. The left ventricular internal cavity size was normal in size. There is  no left ventricular hypertrophy. Left ventricular diastolic parameters are indeterminate. Right Ventricle: The right ventricular size is normal. No increase in right ventricular wall thickness. Right ventricular systolic function is normal. There is moderately elevated pulmonary artery systolic pressure. The tricuspid regurgitant velocity is 3.27 m/s, and with an assumed right atrial pressure of 8 mmHg, the estimated right ventricular systolic pressure is 50.8 mmHg. Left Atrium: Left atrial size was normal in size. Right Atrium: Right atrial size was normal in size. Pericardium: There is no evidence of pericardial effusion. Mitral Valve: The mitral valve is degenerative in appearance. Mild mitral valve regurgitation. No evidence of mitral valve stenosis. Tricuspid Valve: The tricuspid valve is normal in structure. Tricuspid valve regurgitation is mild to moderate. No evidence of tricuspid stenosis. Aortic Valve: The  aortic valve is tricuspid. Aortic valve regurgitation is not visualized. Aortic valve sclerosis is present, with no evidence of aortic valve stenosis. Pulmonic Valve: The pulmonic valve was not well visualized. Pulmonic valve regurgitation is not visualized. No evidence of pulmonic stenosis. Aorta: The aortic root and ascending aorta are structurally normal, with no evidence of dilitation. Venous: The inferior vena cava is normal in size with less than 50% respiratory variability, suggesting right atrial pressure of 8 mmHg. IAS/Shunts: No atrial level shunt detected by color flow Doppler. Additional Comments: There is a large pleural effusion in the left lateral region.  LEFT VENTRICLE PLAX 2D LVIDd:         4.35 cm     Diastology LVIDs:         2.60 cm     LV e' medial:    11.10 cm/s LV PW:         0.80 cm     LV E/e' medial:  6.7 LV IVS:        0.85 cm     LV e' lateral:   7.07 cm/s LVOT diam:     1.90 cm     LV E/e' lateral: 10.6 LV SV:         65 LV SV Index:   47 LVOT Area:     2.84 cm  LV Volumes (MOD) LV vol d, MOD A2C: 52.1 ml LV vol d, MOD A4C: 41.0 ml LV vol s, MOD A2C: 24.5 ml LV vol s, MOD A4C:  14.8 ml LV SV MOD A2C:     27.6 ml LV SV MOD A4C:     41.0 ml LV SV MOD BP:      28.9 ml RIGHT VENTRICLE             IVC RV S prime:     13.60 cm/s  IVC diam: 1.80 cm TAPSE (M-mode): 2.5 cm LEFT ATRIUM             Index        RIGHT ATRIUM           Index LA diam:        1.90 cm 1.39 cm/m   RA Area:     13.80 cm LA Vol (A2C):   27.3 ml 20.04 ml/m  RA Volume:   35.50 ml  26.06 ml/m LA Vol (A4C):   19.6 ml 14.39 ml/m LA Biplane Vol: 24.7 ml 18.13 ml/m  AORTIC VALVE LVOT Vmax:   124.00 cm/s LVOT Vmean:  75.900 cm/s LVOT VTI:    0.228 m  AORTA Ao Root diam: 3.00 cm Ao Asc diam:  2.90 cm MITRAL VALVE               TRICUSPID VALVE MV Area (PHT): 3.32 cm    TR Peak grad:   42.8 mmHg MV Decel Time: 229 msec    TR Vmax:        327.00 cm/s MV E velocity: 74.65 cm/s MV A velocity: 91.85 cm/s  SHUNTS MV E/A ratio:   0.81        Systemic VTI:  0.23 m                            Systemic Diam: 1.90 cm Sunit Tolia Electronically signed by Tessa Lerner Signature Date/Time: 10/17/2023/12:51:21 PM    Final    US RENAL Result Date: 10/15/2023 CLINICAL DATA:  Nephrolithiasis EXAM: RENAL / URINARY TRACT ULTRASOUND COMPLETE COMPARISON:  CT abdomen and pelvis 09/05/2016 FINDINGS: Right Kidney: Renal measurements: 9.9 x 4 x 4.3 cm = volume: 87 mL. Echogenicity within normal limits. No mass or hydronephrosis visualized. Left Kidney: Renal measurements: 9.9 x 4.7 x 5.6 cm = volume: 136 mL. Normal parenchymal echotexture and thickness. Mild hydronephrosis. No discrete intrarenal stones are demonstrated. Bladder: Appears normal for degree of bladder distention. Other: None. IMPRESSION: 1. Mild hydronephrosis of the left kidney. Etiology is indeterminate. 2. Normal appearance of the right kidney. 3. No discrete intrarenal stones are demonstrated. Electronically Signed   By: Burman Nieves M.D.   On: 10/15/2023 19:50   Abd 1 View (KUB) Result Date: 10/14/2023 CLINICAL DATA:  [address] Abdominal distension [phone]. EXAM: ABDOMEN - 1 VIEW COMPARISON:  CT scan abdomen and pelvis from 09/05/2016 FINDINGS: The bowel gas pattern is non-obstructive.  No abnormal stool burden. No evidence of pneumoperitoneum, within the limitations of a supine film. No acute osseous abnormalities. There is a well-circumscribed 7 x 9 mm calcification overlying the left renal shadow. There is dystrophic calcification overlying the left side of the pelvis, which corresponds to calcified leiomyomas. Bowel anastomotic sutures noted overlying the right lower quadrant. There are surgical clips in the right upper quadrant, typical of a previous cholecystectomy. The soft tissues are otherwise within normal limits. IMPRESSION: 1. Nonobstructive bowel gas pattern. No abnormal stool burden. 2. There is a 7 x 9 mm calcification overlying the left renal shadow, which may represent a  renal calculus. Electronically Signed  By: Jules Schick M.D.   On: 10/14/2023 17:58   DG Chest Portable 1 View Result Date: 10/14/2023 CLINICAL DATA:  Altered mental status. EXAM: PORTABLE CHEST 1 VIEW COMPARISON:  June 20, 2023. FINDINGS: The heart size and mediastinal contours are within normal limits. Both lungs are clear. The visualized skeletal structures are unremarkable. IMPRESSION: No active disease. Electronically Signed   By: Lupita Raider M.D.   On: 10/14/2023 13:50   CT HEAD WO CONTRAST ( ) Result Date: 10/14/2023 CLINICAL DATA:  Provided history: Head trauma, minor. Additional history provided: Fall yesterday (with head trauma). EXAM: CT HEAD WITHOUT CONTRAST TECHNIQUE: Contiguous axial images were obtained from the base of the skull through the vertex without intravenous contrast. RADIATION DOSE REDUCTION: This exam was performed according to the departmental dose-optimization program which includes automated exposure control, adjustment of the mA and/or kV according to patient size and/or use of iterative reconstruction technique. COMPARISON:  Brain MRI 01/07/23 FINDINGS: Brain: Generalized cerebral and cerebellar atrophy. Advanced patchy and confluent hypoattenuation within the cerebral white matter, nonspecific but most often secondary to chronic small vessel ischemia. There is no acute intracranial hemorrhage. No demarcated cortical infarct. No extra-axial fluid collection. No evidence of an intracranial mass. No midline shift. Vascular: No hyperdense vessel. Atherosclerotic calcifications. Skull: No calvarial fracture or aggressive osseous lesion. Sinuses/Orbits: No mass or acute finding within the imaged orbits. No significant paranasal sinus disease at the imaged levels. IMPRESSION: 1. No evidence of an acute intracranial abnormality. 2. Advanced cerebral white matter disease, nonspecific but most often secondary to chronic small vessel ischemia. 3. Generalized parenchymal  atrophy. Electronically Signed   By: Jackey Loge D.O.   On: 10/14/2023 12:07     Subjective: - no chest pain, shortness of breath, no abdominal pain, nausea or vomiting.   Discharge Exam: BP (!) 118/54 (BP Location: Right Arm)   Pulse 92   Temp 98.7 F (37.1 C) (Oral)   Resp 18   Ht 4\' 11"  (1.499 m)   Wt 44.5 kg   SpO2 92%   BMI 19.79 kg/m   General: Pt is alert, awake, not in acute distress Cardiovascular: RRR, S1/S2 +, no rubs, no gallops Respiratory: CTA bilaterally, no wheezing, no rhonchi Abdominal: Soft, NT, ND, bowel sounds + Extremities: no edema, no cyanosis  The results of significant diagnostics from this hospitalization (including imaging, microbiology, ancillary and laboratory) are listed below for reference.     Microbiology: Recent Results (from the past 240 hours)  Culture, blood (routine x 2)     Status: Abnormal   Collection Time: 10/14/23 11:25 AM   Specimen: BLOOD  Result Value Ref Range Status   Specimen Description   Final    BLOOD LEFT ANTECUBITAL Performed at [facility], [address] [place], [place] [address]    Special Requests   Final    BOTTLES DRAWN AEROBIC AND ANAEROBIC Blood Culture adequate volume Performed at [facility], [address] [place], [place] [address]    Culture  Setup Time   Final    GRAM POSITIVE COCCI IN CLUSTERS AEROBIC BOTTLE ONLY CRITICAL RESULT CALLED TO, READ BACK BY AND VERIFIED WITH: PHARMD NATHAN BATCHELDER ON 10/15/23 @ 1353 BY DRT    Culture (A)  Final    STAPHYLOCOCCUS EPIDERMIDIS THE SIGNIFICANCE OF ISOLATING THIS ORGANISM FROM A SINGLE SET OF BLOOD CULTURES WHEN MULTIPLE SETS ARE DRAWN IS UNCERTAIN. PLEASE NOTIFY THE MICROBIOLOGY DEPARTMENT WITHIN ONE WEEK IF SPECIATION AND SENSITIVITIES ARE  REQUIRED. Performed at [facility], [address] [place], [place] [address]    Report Status 10/16/2023 FINAL  Final  Culture, blood (routine x 2)     Status:  Abnormal   Collection Time: 10/14/23 11:25 AM   Specimen: BLOOD  Result Value Ref Range Status   Specimen Description   Final    BLOOD BLOOD RIGHT ARM Performed at [facility], [address] [place], [place] [address]    Special Requests   Final    BOTTLES DRAWN AEROBIC AND ANAEROBIC Blood Culture adequate volume Performed at [facility], [address] [place], [place] [address]    Culture  Setup Time   Final    GRAM POSITIVE COCCI IN CHAINS ANAEROBIC BOTTLE ONLY CRITICAL RESULT CALLED TO, READ BACK BY AND VERIFIED WITH: PHARMD JUSTIN LEGGE ON 10/16/23 @ 1336 BY DRT Performed at [facility], [address], [place], [place] [address]    Culture ENTEROCOCCUS FAECALIS (A)  Final   Report Status 10/19/2023 FINAL  Final   Organism ID, Bacteria ENTEROCOCCUS FAECALIS  Final      Susceptibility   Enterococcus faecalis - MIC*    AMPICILLIN <=2 SENSITIVE Sensitive     VANCOMYCIN 1 SENSITIVE Sensitive     GENTAMICIN SYNERGY SENSITIVE Sensitive     * ENTEROCOCCUS FAECALIS  Blood Culture ID Panel (Reflexed)     Status: Abnormal   Collection Time: 10/14/23 11:25 AM  Result Value Ref Range Status   Enterococcus faecalis NOT DETECTED NOT DETECTED Final   Enterococcus Faecium NOT DETECTED NOT DETECTED Final   Listeria monocytogenes NOT DETECTED NOT DETECTED Final   Staphylococcus species DETECTED (A) NOT DETECTED Final    Comment: CRITICAL RESULT CALLED TO, READ BACK BY AND VERIFIED WITH: PHARMD NATHAN BATCHELDER ON 10/15/23 @ 1353 BY DRT    Staphylococcus aureus (BCID) NOT DETECTED NOT DETECTED Final   Staphylococcus epidermidis DETECTED (A) NOT DETECTED Final    Comment: CRITICAL RESULT CALLED TO, READ BACK BY AND VERIFIED WITH: PHARMD NATHAN BATCHELDER ON 10/15/23 @ 1353 BY DRT    Staphylococcus lugdunensis NOT DETECTED NOT DETECTED Final   Streptococcus species NOT DETECTED NOT DETECTED Final   Streptococcus agalactiae NOT DETECTED NOT  DETECTED Final   Streptococcus pneumoniae NOT DETECTED NOT DETECTED Final   Streptococcus pyogenes NOT DETECTED NOT DETECTED Final   A.calcoaceticus-baumannii NOT DETECTED NOT DETECTED Final   Bacteroides fragilis NOT DETECTED NOT DETECTED Final   Enterobacterales NOT DETECTED NOT DETECTED Final   Enterobacter cloacae complex NOT DETECTED NOT DETECTED Final   Escherichia coli NOT DETECTED NOT DETECTED Final   Klebsiella aerogenes NOT DETECTED NOT DETECTED Final   Klebsiella oxytoca NOT DETECTED NOT DETECTED Final   Klebsiella pneumoniae NOT DETECTED NOT DETECTED Final   Proteus species NOT DETECTED NOT DETECTED Final   Salmonella species NOT DETECTED NOT DETECTED Final   Serratia marcescens NOT DETECTED NOT DETECTED Final   Haemophilus influenzae NOT DETECTED NOT DETECTED Final   Neisseria meningitidis NOT DETECTED NOT DETECTED Final   Pseudomonas aeruginosa NOT DETECTED NOT DETECTED Final   Stenotrophomonas maltophilia NOT DETECTED NOT DETECTED Final   Candida albicans NOT DETECTED NOT DETECTED Final   Candida auris NOT DETECTED NOT DETECTED Final   Candida glabrata NOT DETECTED NOT DETECTED Final   Candida krusei NOT DETECTED NOT DETECTED Final   Candida parapsilosis NOT DETECTED NOT DETECTED Final   Candida tropicalis NOT DETECTED NOT DETECTED Final   Cryptococcus neoformans/gattii  NOT DETECTED NOT DETECTED Final   Methicillin resistance mecA/C NOT DETECTED NOT DETECTED Final    Comment: Performed at [facility], [address], [place], [place] [address]  Blood Culture ID Panel (Reflexed)     Status: Abnormal   Collection Time: 10/14/23 11:25 AM  Result Value Ref Range Status   Enterococcus faecalis DETECTED (A) NOT DETECTED Final    Comment: CRITICAL RESULT CALLED TO, READ BACK BY AND VERIFIED WITH: PHARMD JUSTIN LEGGE ON 10/16/23 @ 1336 BY DRT    Enterococcus Faecium NOT DETECTED NOT DETECTED Final   Listeria monocytogenes NOT DETECTED NOT DETECTED Final    Staphylococcus species NOT DETECTED NOT DETECTED Final   Staphylococcus aureus (BCID) NOT DETECTED NOT DETECTED Final   Staphylococcus epidermidis NOT DETECTED NOT DETECTED Final   Staphylococcus lugdunensis NOT DETECTED NOT DETECTED Final   Streptococcus species NOT DETECTED NOT DETECTED Final   Streptococcus agalactiae NOT DETECTED NOT DETECTED Final   Streptococcus pneumoniae NOT DETECTED NOT DETECTED Final   Streptococcus pyogenes NOT DETECTED NOT DETECTED Final   A.calcoaceticus-baumannii NOT DETECTED NOT DETECTED Final   Bacteroides fragilis NOT DETECTED NOT DETECTED Final   Enterobacterales NOT DETECTED NOT DETECTED Final   Enterobacter cloacae complex NOT DETECTED NOT DETECTED Final   Escherichia coli NOT DETECTED NOT DETECTED Final   Klebsiella aerogenes NOT DETECTED NOT DETECTED Final   Klebsiella oxytoca NOT DETECTED NOT DETECTED Final   Klebsiella pneumoniae NOT DETECTED NOT DETECTED Final   Proteus species NOT DETECTED NOT DETECTED Final   Salmonella species NOT DETECTED NOT DETECTED Final   Serratia marcescens NOT DETECTED NOT DETECTED Final   Haemophilus influenzae NOT DETECTED NOT DETECTED Final   Neisseria meningitidis NOT DETECTED NOT DETECTED Final   Pseudomonas aeruginosa NOT DETECTED NOT DETECTED Final   Stenotrophomonas maltophilia NOT DETECTED NOT DETECTED Final   Candida albicans NOT DETECTED NOT DETECTED Final   Candida auris NOT DETECTED NOT DETECTED Final   Candida glabrata NOT DETECTED NOT DETECTED Final   Candida krusei NOT DETECTED NOT DETECTED Final   Candida parapsilosis NOT DETECTED NOT DETECTED Final   Candida tropicalis NOT DETECTED NOT DETECTED Final   Cryptococcus neoformans/gattii NOT DETECTED NOT DETECTED Final   Vancomycin resistance NOT DETECTED NOT DETECTED Final    Comment: Performed at [facility], [address], [place], [place] [address]  Urine Culture     Status: Abnormal   Collection Time: 10/14/23 12:00 PM   Specimen: Urine,  Random  Result Value Ref Range Status   Specimen Description   Final    URINE, RANDOM Performed at [facility], [address] [place], [place] [address]    Special Requests   Final    NONE Reflexed from [phone] Performed at [facility], [address], [place], [place] [address]    Culture 30,000 COLONIES/mL ENTEROCOCCUS FAECALIS (A)  Final   Report Status 10/16/2023 FINAL  Final   Organism ID, Bacteria ENTEROCOCCUS FAECALIS (A)  Final      Susceptibility   Enterococcus faecalis - MIC*    AMPICILLIN <=2 SENSITIVE Sensitive     NITROFURANTOIN <=16 SENSITIVE Sensitive     VANCOMYCIN 1 SENSITIVE Sensitive     * 30,000 COLONIES/mL ENTEROCOCCUS FAECALIS  Culture, blood (Routine X 2) w Reflex to ID Panel     Status: None (Preliminary result)   Collection Time: 10/16/23  9:18 AM   Specimen: BLOOD RIGHT ARM  Result Value Ref Range Status  Specimen Description   Final    BLOOD RIGHT ARM Performed at [facility], [address] [place], [place] [address]    Special Requests   Final    BOTTLES DRAWN AEROBIC ONLY Blood Culture results may not be optimal due to an inadequate volume of blood received in culture bottles Performed at [facility], [address], [place], [place] [address]    Culture   Final    NO GROWTH 3 DAYS Performed at [facility], [address], [place], [place] [address]    Report Status PENDING  Incomplete  Culture, blood (Routine X 2) w Reflex to ID Panel     Status: None (Preliminary result)   Collection Time: 10/16/23  9:23 AM   Specimen: BLOOD RIGHT HAND  Result Value Ref Range Status   Specimen Description   Final    BLOOD RIGHT HAND Performed at [facility], [address], [place], [place] [address]    Special Requests   Final    BOTTLES DRAWN AEROBIC ONLY Blood Culture results may not be optimal due to an inadequate volume of blood received in culture bottles Performed at  [facility], [address], [place], [place] [address]    Culture   Final    NO GROWTH 3 DAYS Performed at [facility], [address] [place], [place] [address]    Report Status PENDING  Incomplete     Labs: Basic Metabolic Panel: Recent Labs  Lab 10/14/23 1038 10/15/23 0605 10/16/23 0542 10/17/23 0459 10/19/23 0729  NA 136 137 131* 134*  --   K 3.3* 3.9 3.6 3.7  --   CL 104 104 102 104  --   CO2 22 21* 23 20*  --   GLUCOSE 148* 83 111* 89  --   BUN 25* 28* 24* 19  --   CREATININE 1.38* 1.40* 1.11* 1.11* 1.17*  CALCIUM 8.1* 8.1* 7.7* 7.7*  --   MG  --  1.8 1.8 1.8  --   PHOS  --  3.0 1.4*  --   --    Liver Function Tests: Recent Labs  Lab 10/14/23 1038 10/15/23 0605 10/16/23 0542  AST 28 29 25   ALT 15 18 18   ALKPHOS 43 43 42  BILITOT 0.5 0.6 0.3  PROT 6.0* 5.7* 5.1*  ALBUMIN 3.1* 2.8* 2.4*   CBC: Recent Labs  Lab 10/14/23 1038 10/15/23 0605 10/16/23 0542 10/17/23 0459  WBC 14.1* 13.3* 9.8 10.4  NEUTROABS  --  11.6*  --   --   HGB 9.0* 10.5* 9.3* 9.5*  HCT 27.4* 32.7* 28.5* 28.4*  MCV 100.0 101.2* 99.7 99.3  PLT 179 190 200 226   CBG: Recent Labs  Lab 10/14/23 1059  GLUCAP 120*   Hgb A1c No results for input(s): "HGBA1C" in the last 72 hours. Lipid Profile No results for input(s): "CHOL", "HDL", "LDLCALC", "TRIG", "CHOLHDL", "LDLDIRECT" in the last 72 hours. Thyroid function studies No results for input(s): "TSH", "T4TOTAL", "T3FREE", "THYROIDAB" in the last 72 hours.  Invalid input(s): "FREET3" Urinalysis    Component Value Date/Time   COLORURINE YELLOW 10/14/2023 1200   APPEARANCEUR HAZY (A) 10/14/2023 1200   LABSPEC 1.024 10/14/2023 1200   PHURINE 5.0 10/14/2023 1200   GLUCOSEU NEGATIVE 10/14/2023 1200   HGBUR SMALL (A) 10/14/2023 1200   BILIRUBINUR NEGATIVE 10/14/2023 1200   BILIRUBINUR negative 06/20/2023 1231   KETONESUR 20 (A) 10/14/2023 1200   PROTEINUR  100 (A) 10/14/2023 1200   UROBILINOGEN 0.2  06/20/2023 1231   UROBILINOGEN 0.2 10/21/2010 2043   NITRITE NEGATIVE 10/14/2023 1200   LEUKOCYTESUR MODERATE (A) 10/14/2023 1200    FURTHER DISCHARGE INSTRUCTIONS:   Get Medicines reviewed and adjusted: Please take all your medications with you for your next visit with your Primary MD   Laboratory/radiological data: Please request your Primary MD to go over all hospital tests and procedure/radiological results at the follow up, please ask your Primary MD to get all Hospital records sent to his/her office.   In some cases, they will be blood work, cultures and biopsy results pending at the time of your discharge. Please request that your primary care M.D. goes through all the records of your hospital data and follows up on these results.   Also Note the following: If you experience worsening of your admission symptoms, develop shortness of breath, life threatening emergency, suicidal or homicidal thoughts you must seek medical attention immediately by calling 911 or calling your MD immediately  if symptoms less severe.   You must read complete instructions/literature along with all the possible adverse reactions/side effects for all the Medicines you take and that have been prescribed to you. Take any new Medicines after you have completely understood and accpet all the possible adverse reactions/side effects.    Do not drive when taking Pain medications or sleeping medications (Benzodaizepines)   Do not take more than prescribed Pain, Sleep and Anxiety Medications. It is not advisable to combine anxiety,sleep and pain medications without talking with your primary care practitioner   Special Instructions: If you have smoked or chewed Tobacco  in the last 2 yrs please stop smoking, stop any regular Alcohol  and or any Recreational drug use.   Wear Seat belts while driving.   Please note: You were cared for by a hospitalist during your hospital stay. Once you are discharged, your primary  care physician will handle any further medical issues. Please note that NO REFILLS for any discharge medications will be authorized once you are discharged, as it is imperative that you return to your primary care physician (or establish a relationship with a primary care physician if you do not have one) for your post hospital discharge needs so that they can reassess your need for medications and monitor your lab values.  Time coordinating discharge: 40 minutes  SIGNED:  Pamella Pert, MD, PhD 10/20/2023, 9:49 AM

## 2023-10-20 NOTE — Progress Notes (Signed)
 Called report to [facility] spoke with [facility]

## 2023-10-20 NOTE — Plan of Care (Signed)

## 2023-10-20 NOTE — Care Management Important Message (Signed)
 Important Message  Patient Details IM Letter given. Name: [name] MRN: [id number] Date of Birth: 17-Feb-1941   Important Message Given:  Yes - Medicare IM     Caren Macadam 10/20/2023, 10:27 AM

## 2023-10-21 LAB — CULTURE, BLOOD (ROUTINE X 2)
Culture: NO GROWTH
Culture: NO GROWTH

## 2024-01-13 ENCOUNTER — Inpatient Hospital Stay (HOSPITAL_COMMUNITY)
Admission: EM | Admit: 2024-01-13 | Discharge: 2024-01-16 | DRG: 853 | Disposition: A | Discharge status: Home Health | Attending: Family Medicine | Admitting: Family Medicine

## 2024-01-13 ENCOUNTER — Encounter (HOSPITAL_COMMUNITY): Payer: Self-pay | Admitting: Emergency Medicine

## 2024-01-13 ENCOUNTER — Emergency Department (HOSPITAL_COMMUNITY)

## 2024-01-13 ENCOUNTER — Other Ambulatory Visit: Payer: Self-pay

## 2024-01-13 DIAGNOSIS — Z1152 Encounter for screening for COVID-19: Secondary | ICD-10-CM | POA: Diagnosis not present

## 2024-01-13 DIAGNOSIS — G9341 Metabolic encephalopathy: Secondary | ICD-10-CM | POA: Diagnosis present

## 2024-01-13 DIAGNOSIS — I501 Left ventricular failure: Secondary | ICD-10-CM | POA: Diagnosis not present

## 2024-01-13 DIAGNOSIS — N136 Pyonephrosis: Secondary | ICD-10-CM | POA: Diagnosis present

## 2024-01-13 DIAGNOSIS — A4150 Gram-negative sepsis, unspecified: Secondary | ICD-10-CM | POA: Diagnosis present

## 2024-01-13 DIAGNOSIS — H9190 Unspecified hearing loss, unspecified ear: Secondary | ICD-10-CM | POA: Diagnosis present

## 2024-01-13 DIAGNOSIS — K219 Gastro-esophageal reflux disease without esophagitis: Secondary | ICD-10-CM | POA: Diagnosis present

## 2024-01-13 DIAGNOSIS — Z8673 Personal history of transient ischemic attack (TIA), and cerebral infarction without residual deficits: Secondary | ICD-10-CM | POA: Diagnosis not present

## 2024-01-13 DIAGNOSIS — E8770 Fluid overload, unspecified: Secondary | ICD-10-CM | POA: Diagnosis not present

## 2024-01-13 DIAGNOSIS — N3281 Overactive bladder: Secondary | ICD-10-CM | POA: Diagnosis present

## 2024-01-13 DIAGNOSIS — Z7982 Long term (current) use of aspirin: Secondary | ICD-10-CM | POA: Diagnosis not present

## 2024-01-13 DIAGNOSIS — Z8249 Family history of ischemic heart disease and other diseases of the circulatory system: Secondary | ICD-10-CM | POA: Diagnosis not present

## 2024-01-13 DIAGNOSIS — A4181 Sepsis due to Enterococcus: Principal | ICD-10-CM | POA: Diagnosis present

## 2024-01-13 DIAGNOSIS — Z9049 Acquired absence of other specified parts of digestive tract: Secondary | ICD-10-CM

## 2024-01-13 DIAGNOSIS — F0154 Vascular dementia, unspecified severity, with anxiety: Secondary | ICD-10-CM | POA: Diagnosis present

## 2024-01-13 DIAGNOSIS — N390 Urinary tract infection, site not specified: Secondary | ICD-10-CM | POA: Diagnosis not present

## 2024-01-13 DIAGNOSIS — Z882 Allergy status to sulfonamides status: Secondary | ICD-10-CM

## 2024-01-13 DIAGNOSIS — R112 Nausea with vomiting, unspecified: Secondary | ICD-10-CM | POA: Diagnosis present

## 2024-01-13 DIAGNOSIS — J449 Chronic obstructive pulmonary disease, unspecified: Secondary | ICD-10-CM | POA: Diagnosis present

## 2024-01-13 DIAGNOSIS — F17210 Nicotine dependence, cigarettes, uncomplicated: Secondary | ICD-10-CM | POA: Diagnosis present

## 2024-01-13 DIAGNOSIS — Z79899 Other long term (current) drug therapy: Secondary | ICD-10-CM | POA: Diagnosis not present

## 2024-01-13 DIAGNOSIS — N201 Calculus of ureter: Secondary | ICD-10-CM | POA: Diagnosis not present

## 2024-01-13 DIAGNOSIS — R32 Unspecified urinary incontinence: Secondary | ICD-10-CM | POA: Diagnosis present

## 2024-01-13 DIAGNOSIS — N179 Acute kidney failure, unspecified: Secondary | ICD-10-CM | POA: Diagnosis present

## 2024-01-13 DIAGNOSIS — R531 Weakness: Secondary | ICD-10-CM | POA: Diagnosis present

## 2024-01-13 DIAGNOSIS — A419 Sepsis, unspecified organism: Principal | ICD-10-CM

## 2024-01-13 DIAGNOSIS — K5289 Other specified noninfective gastroenteritis and colitis: Secondary | ICD-10-CM | POA: Diagnosis present

## 2024-01-13 DIAGNOSIS — Z833 Family history of diabetes mellitus: Secondary | ICD-10-CM | POA: Diagnosis not present

## 2024-01-13 DIAGNOSIS — F419 Anxiety disorder, unspecified: Secondary | ICD-10-CM | POA: Diagnosis not present

## 2024-01-13 LAB — CBC WITH DIFFERENTIAL/PLATELET
Abs Immature Granulocytes: 0.04 10*3/uL (ref 0.00–0.07)
Basophils Absolute: 0 10*3/uL (ref 0.0–0.1)
Basophils Relative: 0 %
Eosinophils Absolute: 0 10*3/uL (ref 0.0–0.5)
Eosinophils Relative: 0 %
HCT: 32.2 % — ABNORMAL LOW (ref 36.0–46.0)
Hemoglobin: 10.5 g/dL — ABNORMAL LOW (ref 12.0–15.0)
Immature Granulocytes: 0 %
Lymphocytes Relative: 9 %
Lymphs Abs: 1 10*3/uL (ref 0.7–4.0)
MCH: 30.6 pg (ref 26.0–34.0)
MCHC: 32.6 g/dL (ref 30.0–36.0)
MCV: 93.9 fL (ref 80.0–100.0)
Monocytes Absolute: 1.4 10*3/uL — ABNORMAL HIGH (ref 0.1–1.0)
Monocytes Relative: 13 %
Neutro Abs: 8.4 10*3/uL — ABNORMAL HIGH (ref 1.7–7.7)
Neutrophils Relative %: 78 %
Platelets: 281 10*3/uL (ref 150–400)
RBC: 3.43 MIL/uL — ABNORMAL LOW (ref 3.87–5.11)
RDW: 14.6 % (ref 11.5–15.5)
WBC: 10.8 10*3/uL — ABNORMAL HIGH (ref 4.0–10.5)
nRBC: 0 % (ref 0.0–0.2)

## 2024-01-13 LAB — RESP PANEL BY RT-PCR (RSV, FLU A&B, COVID)  RVPGX2
Influenza A by PCR: NEGATIVE
Influenza B by PCR: NEGATIVE
Resp Syncytial Virus by PCR: NEGATIVE
SARS Coronavirus 2 by RT PCR: NEGATIVE

## 2024-01-13 LAB — URINALYSIS, W/ REFLEX TO CULTURE (INFECTION SUSPECTED)
Bilirubin Urine: NEGATIVE
Glucose, UA: NEGATIVE mg/dL
Ketones, ur: NEGATIVE mg/dL
Nitrite: NEGATIVE
Protein, ur: 100 mg/dL — AB
RBC / HPF: >50 RBC/hpf (ref 0–5)
Specific Gravity, Urine: 1.019 (ref 1.005–1.030)
WBC, UA: >50 WBC/hpf (ref 0–5)
pH: 5 (ref 5.0–8.0)

## 2024-01-13 LAB — COMPREHENSIVE METABOLIC PANEL WITH GFR
ALT: 12 U/L (ref 0–44)
AST: 20 U/L (ref 15–41)
Albumin: 3.3 g/dL — ABNORMAL LOW (ref 3.5–5.0)
Alkaline Phosphatase: 44 U/L (ref 38–126)
Anion gap: 11 (ref 5–15)
BUN: 23 mg/dL (ref 8–23)
CO2: 26 mmol/L (ref 22–32)
Calcium: 9 mg/dL (ref 8.9–10.3)
Chloride: 98 mmol/L (ref 98–111)
Creatinine, Ser: 1.35 mg/dL — ABNORMAL HIGH (ref 0.44–1.00)
GFR, Estimated: 39 mL/min — ABNORMAL LOW (ref >60)
Glucose, Bld: 125 mg/dL — ABNORMAL HIGH (ref 70–99)
Potassium: 3.9 mmol/L (ref 3.5–5.1)
Sodium: 135 mmol/L (ref 135–145)
Total Bilirubin: 0.3 mg/dL (ref 0.0–1.2)
Total Protein: 7 g/dL (ref 6.5–8.1)

## 2024-01-13 LAB — I-STAT CG4 LACTIC ACID, ED
Lactic Acid, Venous: 1.4 mmol/L (ref 0.5–1.9)
Lactic Acid, Venous: 0.3 mmol/L — ABNORMAL LOW (ref 0.5–1.9)

## 2024-01-13 LAB — PROTIME-INR
INR: 1 (ref 0.8–1.2)
Prothrombin Time: 13 s (ref 11.4–15.2)

## 2024-01-13 MED ORDER — ASPIRIN 81 MG PO TBEC
81.0000 mg | DELAYED_RELEASE_TABLET | Freq: Every day | ORAL | Status: DC
  Administered 2024-01-13 – 2024-01-16 (×4): 81 mg via ORAL
  Filled 2024-01-13 (×4): qty 1

## 2024-01-13 MED ORDER — SODIUM CHLORIDE 0.9 % IV SOLN
1.0000 g | Freq: Two times a day (BID) | INTRAVENOUS | Status: DC
  Administered 2024-01-13 – 2024-01-16 (×7): 1 g via INTRAVENOUS
  Filled 2024-01-13 (×8): qty 1000

## 2024-01-13 MED ORDER — NICOTINE 14 MG/24HR TD PT24
14.0000 mg | MEDICATED_PATCH | Freq: Every day | TRANSDERMAL | Status: DC
  Administered 2024-01-13 – 2024-01-15 (×3): 14 mg via TRANSDERMAL
  Filled 2024-01-13 (×3): qty 1

## 2024-01-13 MED ORDER — ACETAMINOPHEN 500 MG PO TABS
1000.0000 mg | ORAL_TABLET | Freq: Once | ORAL | Status: AC
  Administered 2024-01-13: 1000 mg via ORAL
  Filled 2024-01-13: qty 2

## 2024-01-13 MED ORDER — SODIUM CHLORIDE 0.9 % IV SOLN
1.0000 g | INTRAVENOUS | Status: DC
  Administered 2024-01-14: 1 g via INTRAVENOUS
  Filled 2024-01-13 (×2): qty 10

## 2024-01-13 MED ORDER — DONEPEZIL HCL 10 MG PO TABS
10.0000 mg | ORAL_TABLET | Freq: Every morning | ORAL | Status: DC
  Administered 2024-01-13 – 2024-01-16 (×4): 10 mg via ORAL
  Filled 2024-01-13 (×4): qty 1

## 2024-01-13 MED ORDER — SODIUM CHLORIDE 0.9 % IV BOLUS
1000.0000 mL | Freq: Once | INTRAVENOUS | Status: AC
  Administered 2024-01-13: 1000 mL via INTRAVENOUS

## 2024-01-13 MED ORDER — SODIUM CHLORIDE 0.9 % IV BOLUS (SEPSIS)
1000.0000 mL | Freq: Once | INTRAVENOUS | Status: AC
  Administered 2024-01-13: 1000 mL via INTRAVENOUS

## 2024-01-13 MED ORDER — ESCITALOPRAM OXALATE 10 MG PO TABS
20.0000 mg | ORAL_TABLET | Freq: Every morning | ORAL | Status: DC
  Administered 2024-01-13 – 2024-01-16 (×4): 20 mg via ORAL
  Filled 2024-01-13 (×4): qty 2

## 2024-01-13 MED ORDER — PRESERVISION AREDS 2 PO CAPS: 1.0000 | ORAL_CAPSULE | Freq: Two times a day (BID) | ORAL | Status: DC

## 2024-01-13 MED ORDER — MEMANTINE HCL 10 MG PO TABS
5.0000 mg | ORAL_TABLET | Freq: Two times a day (BID) | ORAL | Status: DC
  Administered 2024-01-13 – 2024-01-16 (×7): 5 mg via ORAL
  Filled 2024-01-13 (×7): qty 1

## 2024-01-13 MED ORDER — ENOXAPARIN SODIUM 300 MG/3ML IJ SOLN
20.0000 mg | INTRAMUSCULAR | Status: DC
  Administered 2024-01-13 – 2024-01-14 (×2): 20 mg via SUBCUTANEOUS
  Filled 2024-01-13 (×6): qty 0.2

## 2024-01-13 MED ORDER — PROSIGHT PO TABS
1.0000 | ORAL_TABLET | Freq: Every day | ORAL | Status: DC
  Administered 2024-01-13 – 2024-01-16 (×4): 1 via ORAL
  Filled 2024-01-13 (×4): qty 1

## 2024-01-13 MED ORDER — SODIUM CHLORIDE 0.9 % IV SOLN: 1.0000 g | Freq: Three times a day (TID) | INTRAVENOUS | Status: DC

## 2024-01-13 MED ORDER — LACTATED RINGERS IV SOLN: INTRAVENOUS | Status: AC

## 2024-01-13 MED ORDER — SODIUM CHLORIDE 0.9 % IV SOLN
2.0000 g | Freq: Once | INTRAVENOUS | Status: AC
  Administered 2024-01-13: 2 g via INTRAVENOUS
  Filled 2024-01-13: qty 20

## 2024-01-13 NOTE — Sepsis Progress Note (Signed)
 Elink monitoring for the code sepsis protocol.

## 2024-01-13 NOTE — ED Triage Notes (Addendum)
 Pt BIB EMS from home with c/o progressive weakness since being outside in the heat on Sunday. Pt is usually able to move herself around, but has not been able to move herself around as much. Hx of dementia  138/68 94HR 154cbg 98RA

## 2024-01-13 NOTE — TOC CM/SW Note (Signed)
 Transition of Care [facility]) - Inpatient Brief Assessment   Patient Details  Name: [name] MRN: [id number] Date of Birth: 1941-01-05  Transition of [facility]) CM/SW Contact:    Lauraine FORBES Saa, LCSW Phone Number: 01/13/2024, 4:21 PM   Clinical Narrative:  4:21 PM Per chart review, patient resides at home. Patient has a PCP and insurance. Patient has SNF history with [facility]. Patient does not have HH or DME history. Patient's preferred pharmacy is [facility]. Patient does not have SDOH needs as of March 6th, 2025 and has history of dementia/memory loss. No TOC needs were identified at this time. TOC will continue to follow and be available to assist.  Transition of Care Asessment: Insurance and Status: Insurance coverage has been reviewed Patient has primary care physician: Yes Home environment has been reviewed: Private Residence Prior level of function:: N/A Prior/Current Home Services: No current home services Social Drivers of Health Review: SDOH reviewed no interventions necessary (As of March 6th, 2025. Patient has history of dementia/memory loss.) Readmission risk has been reviewed: Yes Transition of care needs: no transition of care needs at this time

## 2024-01-13 NOTE — H&P (Signed)
 History and Physical    Patient: [name] FMW:[id number] DOB: Nov 22, 1940 DOA: 01/13/2024 DOS: the patient was seen and examined on 01/13/2024 PCP: Patti Mliss LABOR, FNP  Patient coming from: Home  Chief Complaint:  Chief Complaint  Patient presents with   Weakness   HPI: [name] is a 83 y.o. female with medical history significant of vascular dementia, overactive bladder with incontinence, history of TIA, hearing loss, and recent admission in 09/2023 for AMS iso Enterococcus bacteremia/UTI who is admitted acute encephalopathy.  Pt unable to provide medical history, and husband not available by phone. From what I can gather, pt presented early this morning around 0200 with malaise/weakness after spending  a lot of time in the heat yesterday per EDP note.  In the ED, pt febrile and hypotensive 90/40s and started on aggressive IVF. Labs notable for Cr 1.35, WBC 10.8, lactic acid 1.4 down to 0.3. UA positive or LE, but negative for nitrite. Urine culture pending.  Pt received IV CTX and 3L IVF bolus in ED. Pt admitted to medicine for ongoing care.  Review of Systems: As mentioned in the history of present illness. All other systems reviewed and are negative. History reviewed. No pertinent past medical history. Past Surgical History:  Procedure Laterality Date   CHOLECYSTECTOMY  10/24/2010   COLECTOMY  2006   BENIGN TUMOR   TRANSESOPHAGEAL ECHOCARDIOGRAM (CATH LAB) N/A 10/17/2023   Procedure: TRANSESOPHAGEAL ECHOCARDIOGRAM;  Surgeon: Pietro Redell RAMAN, MD;  Location: [facility] INVASIVE CV LAB;  Service: Cardiovascular;  Laterality: N/A;   Social History:  reports that she has been smoking cigarettes. She has a 50 pack-year smoking history. She has never used smokeless tobacco. She reports that she does not drink alcohol  and does not use drugs.  Allergies  Allergen Reactions   Sulfa Antibiotics Itching and Rash    Family History  Problem Relation Age of Onset   Cancer Brother     Diabetes Brother    Heart disease Brother    Cancer Sister    Diabetes Sister    Breast cancer Neg Hx     Prior to Admission medications   Medication Sig Start Date End Date Taking? Authorizing Provider  ALPRAZolam  (XANAX ) 0.25 MG tablet Take 1 tablet (0.25 mg total) by mouth daily as needed for anxiety. Patient taking differently: Take 0.25 mg by mouth in the morning. 10/20/23  Yes Gherghe, Costin M, MD  aspirin  EC 81 MG tablet Take 81 mg by mouth daily.   Yes [provider]  BLINK TEARS 0.25 % SOLN Place 1 drop into both eyes in the morning.   Yes [provider]  Calcium-Vitamin D-Vitamin K 650-12.5-40 MG-MCG-MCG CHEW Chew 1 tablet by mouth daily.   Yes [provider]  cyanocobalamin  (VITAMIN B12) 1000 MCG tablet Take 1,000 mcg by mouth daily.   Yes [provider]  donepezil  (ARICEPT ) 10 MG tablet Take 10 mg by mouth in the morning. 04/07/23 04/06/24 Yes [provider]  escitalopram  (LEXAPRO ) 20 MG tablet Take 20 mg by mouth in the morning.   Yes [provider]  fluticasone  (FLONASE ) 50 MCG/ACT nasal spray 1-2 sprays per nostril daily if needed 01/25/20  Yes Bobbitt, Elgin Pepper, MD  Ibuprofen 200 MG CAPS Take 200 mg by mouth every 6 (six) hours as needed (for pain or headaches).   Yes [provider]  memantine  (NAMENDA ) 5 MG tablet Take 5 mg by mouth in the morning and at bedtime.   Yes [provider]  mirabegron  ER (MYRBETRIQ ) 50 MG TB24 tablet Take 50 mg by mouth in the morning.   Yes [provider]  Multiple Vitamins-Minerals (ONE A DAY WOMEN 50 PLUS PO) Take 1 tablet by mouth daily after breakfast.   Yes [provider]  Multiple Vitamins-Minerals (PRESERVISION AREDS 2) CAPS Take 1 capsule by mouth 2 (two) times daily.   Yes [provider]  Probiotic Product (PROBIOTIC PO) Take 1 capsule by mouth daily.   Yes [provider]  TYLENOL  500 MG tablet Take 500 mg by mouth every  6 (six) hours as needed for mild pain (pain score 1-3) or headache.   Yes [provider]  nicotine  (NICODERM CQ  - DOSED IN MG/24 HOURS) 21 mg/24hr patch Place 1 patch (21 mg total) onto the skin daily. Patient not taking: Reported on 01/13/2024 10/21/23   Trixie Nilda HERO, MD    Physical Exam: Vitals:   01/13/24 9251 01/13/24 0749 01/13/24 0823 01/13/24 0916  BP: (!) 95/52  (!) 109/46 (!) 101/43  Pulse: 60  62 65  Resp: 16  16 18   Temp:  98.6 F (37 C) 97.7 F (36.5 C) 97.8 F (36.6 C)  TempSrc:  Oral Oral Oral  SpO2: 100%  97% 99%  Weight:      Height:       General: Alert, oriented x3, resting comfortably in no acute distress Respiratory: Lungs clear to auscultation bilaterally with normal respiratory effort; no w/r/r Cardiovascular: Regular rate and rhythm w/o m/r/g Abdomen: Soft, nontender, nondistended. Positive bowel sounds   Data Reviewed:  Lab Results  Component Value Date   WBC 10.8 (H) 01/13/2024   HGB 10.5 (L) 01/13/2024   HCT 32.2 (L) 01/13/2024   MCV 93.9 01/13/2024   PLT 281 01/13/2024   Lab Results  Component Value Date   GLUCOSE 125 (H) 01/13/2024   CALCIUM 9.0 01/13/2024   NA 135 01/13/2024   K 3.9 01/13/2024   CO2 26 01/13/2024   CL 98 01/13/2024   BUN 23 01/13/2024   CREATININE 1.35 (H) 01/13/2024   Lab Results  Component Value Date   ALT 12 01/13/2024   AST 20 01/13/2024   ALKPHOS 44 01/13/2024   BILITOT 0.3 01/13/2024   Lab Results  Component Value Date   INR 1.0 01/13/2024    Radiology: [facility] Chest Port 1 View Result Date: 01/13/2024 CLINICAL DATA:  Sepsis EXAM: PORTABLE CHEST 1 VIEW COMPARISON:  10/14/2023 FINDINGS: The heart size and mediastinal contours are within normal limits. Both lungs are clear. The visualized skeletal structures are unremarkable. IMPRESSION: No active disease. Electronically Signed   By: Dorethia Molt M.D.   On: 01/13/2024 02:49    Assessment and Plan: 69F h/o vascular dementia, TIA, hearing loss,  overactive bladder with incontinence, and recent admission in 09/2023 for AMS iso Enterococcus bacteremia/UTI who is admitted sepsis 2/2 presumed UTI c/b acute encephalopathy.  Sepsis 2/2 presumed UTI Acute encephalopathy -Continue IV CTX daily for now -Start IV ampicillin  1g TID -NS @ 150cc/h for now -F/u urine and blood cultures; consider ID consult pending results  Vascular dementia -PTA lexapro  20mg  daily, donepezil  10mg  daily, and memantine  5mg  BID   Advance Care Planning:   Code Status: Full Code   Consults: N/A  Family Communication: N/A  Severity of Illness: The appropriate patient status for this patient is INPATIENT. Inpatient status is judged to be reasonable and necessary in order to provide the required intensity of service to ensure the patient's safety. The  patient's presenting symptoms, physical exam findings, and initial radiographic and laboratory data in the context of their chronic comorbidities is felt to place them at high risk for further clinical deterioration. Furthermore, it is not anticipated that the patient will be medically stable for discharge from the hospital within 2 midnights of admission.   * I certify that at the point of admission it is my clinical judgment that the patient will require inpatient hospital care spanning beyond 2 midnights from the point of admission due to high intensity of service, high risk for further deterioration and high frequency of surveillance required.*   ------- I spent 55 minutes reviewing previous labs/notes, obtaining separate history at the bedside, counseling/discussing the treatment plan outlined above, ordering medications/tests, and performing clinical documentation.  Author: Marsha Ada, MD 01/13/2024 9:55 AM  For on call review [email or web address].

## 2024-01-13 NOTE — Progress Notes (Signed)
 PHARMACY NOTE:  ANTIMICROBIAL RENAL DOSAGE ADJUSTMENT  Current antimicrobial regimen includes a mismatch between antimicrobial dosage and estimated renal function.  As per policy approved by the Pharmacy & Therapeutics and Medical Executive Committees, the antimicrobial dosage will be adjusted accordingly.  Current antimicrobial dosage:  Ampicillin  1g IV q8h  Indication: hx of E. Faecalis UTI  Renal Function: SCr trending up  Estimated Creatinine Clearance: 21.2 mL/min (A) (by C-G formula based on SCr of 1.35 mg/dL (H)). []      On intermittent HD, scheduled: []      On CRRT    Antimicrobial dosage has been changed to:  Ampicillin  1g IV every 12 hours  Additional comments:   Thank you for allowing pharmacy to be a part of this patient's care.  Harlene Boga, PharmD, BCPS, BCCCP Clinical Pharmacist Please refer to [facility] for [facility] Pharm[facility] numbers 01/13/2024 11:05 AM

## 2024-01-13 NOTE — Hospital Course (Signed)
 83 year old female history of vascular dementia, overactive bladder, TIA, hearing loss and previous history of sepsis secondary to Enterococcus UTI and bacteremia in March 2025 presented to emergency department complaining of progressive weakness since being asked outside of the heat on Sunday.  At presentation to ED found febrile, borderline hypotensive and tachypneic.  Respiratory panel negative.  CMP showing elevated creatinine 1.35 and GFR 39, low albumin 3.3 otherwise unremarkable.  CBC showing leukocytosis 10.8, stable H&H normal platelet count.  Blood cultures are in process.  Lactic acid within normal range.  UA showing evidence of UTI.  Chest x-ray evidence of pneumonia.  Not sure why code sepsis has been activated in the ED. Received 1 L of NS bolus currently on maintenance fluid LR and with ceftriaxone  2 g.  Hospitalist has been consulted for management of sepsis in the setting of acute cystitis.

## 2024-01-13 NOTE — ED Notes (Signed)
MD informed of blood pressure

## 2024-01-13 NOTE — ED Provider Notes (Signed)
 [facility]-EMERGENCY DEPT [facility] Emergency Department Provider Note MRN:  [id number]  Arrival date & time: 01/13/24     Chief Complaint   Weakness   History of Present Illness   [name] is a 83 y.o. year-old female with no pertinent past medical history presenting to the ED with chief complaint of weakness.  Spent a lot of time in the heat yesterday.  Progressive weakness and malaise since then.  Arrives with fever.  Review of Systems  A thorough review of systems was obtained and all systems are negative except as noted in the HPI and PMH.   Patient's Health History   History reviewed. No pertinent past medical history.  Past Surgical History:  Procedure Laterality Date   CHOLECYSTECTOMY  10/24/2010   COLECTOMY  2006   BENIGN TUMOR   TRANSESOPHAGEAL ECHOCARDIOGRAM (CATH LAB) N/A 10/17/2023   Procedure: TRANSESOPHAGEAL ECHOCARDIOGRAM;  Surgeon: Pietro Redell RAMAN, MD;  Location: [facility] INVASIVE CV LAB;  Service: Cardiovascular;  Laterality: N/A;    Family History  Problem Relation Age of Onset   Cancer Brother    Diabetes Brother    Heart disease Brother    Cancer Sister    Diabetes Sister    Breast cancer Neg Hx     Social History   Socioeconomic History   Marital status: Married    Spouse name: Not on file   Number of children: Not on file   Years of education: Not on file   Highest education level: Not on file  Occupational History   Not on file  Tobacco Use   Smoking status: Every Day    Current packs/day: 1.00    Average packs/day: 1 pack/day for 50.0 years (50.0 ttl pk-yrs)    Types: Cigarettes   Smokeless tobacco: Never  Vaping Use   Vaping status: Never Used  Substance and Sexual Activity   Alcohol  use: No   Drug use: No   Sexual activity: Not Currently  Other Topics Concern   Not on file  Social History Narrative   Not on file   Social Drivers of Health   Financial Resource Strain: Not on file  Food Insecurity: Low Risk  (09/25/2023)    Received from [facility]   Hunger Vital Sign    Within the past 12 months, you worried that your food would run out before you got money to buy more: Never true    Within the past 12 months, the food you bought just didn't last and you didn't have money to get more. : Never true  Transportation Needs: No Transportation Needs (09/25/2023)   Received from [employer]    In the past 12 months, has lack of reliable transportation kept you from medical appointments, meetings, work or from getting things needed for daily living? : No  Physical Activity: Not on file  Stress: Not on file  Social Connections: Not on file  Intimate Partner Violence: Not on file     Physical Exam   Vitals:   01/13/24 0345 01/13/24 0400  BP: (!) 106/44 (!) 107/43  Pulse: 79 82  Resp: (!) 22 (!) 24  Temp:    SpO2: 98% 97%    CONSTITUTIONAL: Well-appearing, NAD NEURO/PSYCH: Somnolent, wakes to voice, does not really want to answer questions, moves all extremities EYES:  eyes equal and reactive ENT/NECK:  no LAD, no JVD CARDIO: Regular rate, well-perfused, normal S1 and S2 PULM:  CTAB no wheezing or rhonchi GI/GU:  non-distended, non-tender  MSK/SPINE:  No gross deformities, no edema SKIN:  no rash, atraumatic   *Additional and/or pertinent findings included in MDM below  Diagnostic and Interventional Summary    EKG Interpretation Date/Time:  Tuesday January 13 2024 02:59:50 EDT Ventricular Rate:  85 PR Interval:  126 QRS Duration:  84 QT Interval:  346 QTC Calculation: 412 R Axis:   50  Text Interpretation: Sinus rhythm Confirmed by Theadore Sharper [phone]) on 01/13/2024 3:39:19 AM       Labs Reviewed  COMPREHENSIVE METABOLIC PANEL WITH GFR - Abnormal; Notable for the following components:      Result Value   Glucose, Bld 125 (*)    Creatinine, Ser 1.35 (*)    Albumin 3.3 (*)    GFR, Estimated 39 (*)    All other components within normal limits  CBC WITH  DIFFERENTIAL/PLATELET - Abnormal; Notable for the following components:   WBC 10.8 (*)    RBC 3.43 (*)    Hemoglobin 10.5 (*)    HCT 32.2 (*)    Neutro Abs 8.4 (*)    Monocytes Absolute 1.4 (*)    All other components within normal limits  URINALYSIS, W/ REFLEX TO CULTURE (INFECTION SUSPECTED) - Abnormal; Notable for the following components:   APPearance CLOUDY (*)    Hgb urine dipstick MODERATE (*)    Protein, ur 100 (*)    Leukocytes,Ua LARGE (*)    Bacteria, UA FEW (*)    All other components within normal limits  I-STAT CG4 LACTIC ACID, ED - Abnormal; Notable for the following components:   Lactic Acid, Venous 0.3 (*)    All other components within normal limits  RESP PANEL BY RT-PCR (RSV, FLU A&B, COVID)  RVPGX2  CULTURE, BLOOD (ROUTINE X 2)  CULTURE, BLOOD (ROUTINE X 2)  URINE CULTURE  PROTIME-INR  I-STAT CG4 LACTIC ACID, ED    DG Chest Port 1 View  Final Result      Medications  lactated ringers  infusion ( Intravenous New Bag/Given 01/13/24 0433)  sodium chloride  0.9 % bolus 1,000 mL (0 mLs Intravenous Stopped 01/13/24 0347)  cefTRIAXone  (ROCEPHIN ) 2 g in sodium chloride  0.9 % 100 mL IVPB (0 g Intravenous Stopped 01/13/24 0347)  acetaminophen  (TYLENOL ) tablet 1,000 mg (1,000 mg Oral Given 01/13/24 0246)     Procedures  /  Critical Care .Critical Care  Performed by: Theadore Sharper HERO, MD Authorized by: Theadore Sharper HERO, MD   Critical care provider statement:    Critical care time (minutes):  35   Critical care was necessary to treat or prevent imminent or life-threatening deterioration of the following conditions:  Sepsis   Critical care was time spent personally by me on the following activities:  Development of treatment plan with patient or surrogate, discussions with consultants, evaluation of patient's response to treatment, examination of patient, ordering and review of laboratory studies, ordering and review of radiographic studies, ordering and performing  treatments and interventions, pulse oximetry, re-evaluation of patient's condition and review of old charts   ED Course and Medical Decision Making  Initial Impression and Ddx Concern for sepsis versus heat exhaustion.  If sepsis unclear source of infection.  Abdomen soft.  Past medical/surgical history that increases complexity of ED encounter: None  Interpretation of Diagnostics I personally reviewed the EKG and my interpretation is as follows: Sinus rhythm  No significant blood count or electrolyte disturbance.  Minimal leukocytosis urinalysis suspicious for infection  Patient Reassessment and Ultimate Disposition/Management     Plan is  for hospitalist admission for urosepsis.  Temperature trending down.  Patient management required discussion with the following services or consulting groups:  Hospitalist Service  Complexity of Problems Addressed Acute illness or injury that poses threat of life of bodily function  Additional Data Reviewed and Analyzed Further history obtained from: EMS on arrival  Additional Factors Impacting ED Encounter Risk Consideration of hospitalization  Ozell HERO. Theadore, MD [facility] Emergency Medicine [facility] [email or web address]  Final Clinical Impressions(s) / ED Diagnoses     ICD-10-CM   1. Sepsis, due to unspecified organism, unspecified whether acute organ dysfunction present [facility])  A41.9       ED Discharge Orders     None        Discharge Instructions Discussed with and Provided to Patient:   Discharge Instructions   None      Theadore Ozell HERO, MD 01/13/24 [phone]

## 2024-01-14 ENCOUNTER — Inpatient Hospital Stay (HOSPITAL_COMMUNITY)

## 2024-01-14 ENCOUNTER — Inpatient Hospital Stay (HOSPITAL_COMMUNITY): Admitting: Anesthesiology

## 2024-01-14 ENCOUNTER — Encounter (HOSPITAL_COMMUNITY)
Admission: EM | Disposition: A | Discharge status: Home Health | Payer: Self-pay | Source: Home / Self Care | Attending: Family Medicine

## 2024-01-14 DIAGNOSIS — N201 Calculus of ureter: Secondary | ICD-10-CM | POA: Diagnosis not present

## 2024-01-14 DIAGNOSIS — F419 Anxiety disorder, unspecified: Secondary | ICD-10-CM

## 2024-01-14 DIAGNOSIS — J449 Chronic obstructive pulmonary disease, unspecified: Secondary | ICD-10-CM | POA: Diagnosis not present

## 2024-01-14 DIAGNOSIS — A4150 Gram-negative sepsis, unspecified: Secondary | ICD-10-CM | POA: Diagnosis not present

## 2024-01-14 DIAGNOSIS — N390 Urinary tract infection, site not specified: Secondary | ICD-10-CM

## 2024-01-14 DIAGNOSIS — F17210 Nicotine dependence, cigarettes, uncomplicated: Secondary | ICD-10-CM

## 2024-01-14 HISTORY — PX: CYSTOSCOPY W/ URETERAL STENT PLACEMENT: SHX1429

## 2024-01-14 LAB — BASIC METABOLIC PANEL WITH GFR
Anion gap: 7 (ref 5–15)
BUN: 16 mg/dL (ref 8–23)
CO2: 20 mmol/L — ABNORMAL LOW (ref 22–32)
Calcium: 7.9 mg/dL — ABNORMAL LOW (ref 8.9–10.3)
Chloride: 108 mmol/L (ref 98–111)
Creatinine, Ser: 1.14 mg/dL — ABNORMAL HIGH (ref 0.44–1.00)
GFR, Estimated: 47 mL/min — ABNORMAL LOW (ref >60)
Glucose, Bld: 110 mg/dL — ABNORMAL HIGH (ref 70–99)
Potassium: 3.5 mmol/L (ref 3.5–5.1)
Sodium: 135 mmol/L (ref 135–145)

## 2024-01-14 SURGERY — CYSTOSCOPY, WITH RETROGRADE PYELOGRAM AND URETERAL STENT INSERTION
Anesthesia: Monitor Anesthesia Care | Site: Ureter | Laterality: Left

## 2024-01-14 MED ORDER — ALPRAZOLAM 0.5 MG PO TABS: 0.2500 mg | ORAL_TABLET | Freq: Every day | ORAL | Status: DC | PRN

## 2024-01-14 MED ORDER — ONDANSETRON HCL 4 MG/2ML IJ SOLN: 4.0000 mg | Freq: Three times a day (TID) | INTRAMUSCULAR | Status: DC | PRN

## 2024-01-14 MED ORDER — SUCCINYLCHOLINE CHLORIDE 200 MG/10ML IV SOSY
PREFILLED_SYRINGE | INTRAVENOUS | Status: AC
  Filled 2024-01-14: qty 10

## 2024-01-14 MED ORDER — ONDANSETRON HCL 4 MG/2ML IJ SOLN
4.0000 mg | INTRAMUSCULAR | Status: AC
  Administered 2024-01-14: 4 mg via INTRAVENOUS
  Filled 2024-01-14: qty 2

## 2024-01-14 MED ORDER — ORAL CARE MOUTH RINSE: 15.0000 mL | OROMUCOSAL | Status: DC | PRN

## 2024-01-14 MED ORDER — IOHEXOL 350 MG/ML SOLN
60.0000 mL | Freq: Once | INTRAVENOUS | Status: AC | PRN
  Administered 2024-01-14: 60 mL via INTRAVENOUS

## 2024-01-14 MED ORDER — PROPOFOL 10 MG/ML IV BOLUS
INTRAVENOUS | Status: DC | PRN
  Administered 2024-01-14: 15 mg via INTRAVENOUS
  Administered 2024-01-14: 20 mg via INTRAVENOUS

## 2024-01-14 MED ORDER — FENTANYL CITRATE (PF) 100 MCG/2ML IJ SOLN: 25.0000 ug | INTRAMUSCULAR | Status: DC | PRN

## 2024-01-14 MED ORDER — SODIUM CHLORIDE 0.9 % IR SOLN
Status: DC | PRN
  Administered 2024-01-14: 3000 mL via INTRAVESICAL

## 2024-01-14 MED ORDER — SODIUM CHLORIDE 0.9 % IV SOLN: INTRAVENOUS | Status: DC | PRN

## 2024-01-14 MED ORDER — IOHEXOL 300 MG/ML  SOLN
INTRAMUSCULAR | Status: DC | PRN
Start: 2024-01-14 — End: 2024-01-14
  Administered 2024-01-14: 100 mL via URETHRAL

## 2024-01-14 MED ORDER — FENTANYL CITRATE (PF) 250 MCG/5ML IJ SOLN
INTRAMUSCULAR | Status: AC
  Filled 2024-01-14: qty 5

## 2024-01-14 MED ORDER — LACTATED RINGERS IV SOLN: INTRAVENOUS | Status: DC

## 2024-01-14 MED ORDER — LIDOCAINE HCL (CARDIAC) PF 100 MG/5ML IV SOSY
PREFILLED_SYRINGE | INTRAVENOUS | Status: DC | PRN
  Administered 2024-01-14: 60 mg via INTRAVENOUS

## 2024-01-14 MED ORDER — PROPOFOL 500 MG/50ML IV EMUL
INTRAVENOUS | Status: DC | PRN
  Administered 2024-01-14: 45 ug/kg/min via INTRAVENOUS

## 2024-01-14 MED ORDER — FENTANYL CITRATE (PF) 100 MCG/2ML IJ SOLN
INTRAMUSCULAR | Status: DC | PRN
  Administered 2024-01-14: 25 ug via INTRAVENOUS

## 2024-01-14 MED ORDER — PROPOFOL 10 MG/ML IV BOLUS
INTRAVENOUS | Status: AC
Start: 2024-01-14 — End: 2024-01-14
  Filled 2024-01-14: qty 20

## 2024-01-14 MED ORDER — AMISULPRIDE (ANTIEMETIC) 5 MG/2ML IV SOLN: 10.0000 mg | Freq: Once | INTRAVENOUS | Status: DC | PRN

## 2024-01-14 SURGICAL SUPPLY — 18 items
BAG URO CATCHER STRL LF (MISCELLANEOUS) ×1 IMPLANT
BASKET ZERO TIP NITINOL 2.4FR (BASKET) IMPLANT
CATH URETL OPEN 5X70 (CATHETERS) ×1 IMPLANT
DRSG TEGADERM 4X4.75 (GAUZE/BANDAGES/DRESSINGS) IMPLANT
EXTRACTOR STONE 1.7FRX115CM (UROLOGICAL SUPPLIES) IMPLANT
GLOVE BIO SURGEON STRL SZ 6.5 (GLOVE) ×1 IMPLANT
GOWN STRL REUS W/ TWL LRG LVL3 (GOWN DISPOSABLE) ×1 IMPLANT
GUIDEWIRE STR DUAL SENSOR (WIRE) ×1 IMPLANT
KIT TURNOVER KIT B (KITS) ×1 IMPLANT
MANIFOLD NEPTUNE II (INSTRUMENTS) ×1 IMPLANT
PACK CYSTO (CUSTOM PROCEDURE TRAY) ×1 IMPLANT
SHEATH NAVIGATOR HD 11/13X28 (SHEATH) IMPLANT
SHEATH NAVIGATOR HD 11/13X36 (SHEATH) IMPLANT
SHEATH NAVIGATOR HD 12/14X46 (SHEATH) IMPLANT
SLEEVE SCD COMPRESS KNEE MED (STOCKING) ×1 IMPLANT
SOL .9 NS 3000ML IRR UROMATIC (IV SOLUTION) ×1 IMPLANT
TUBE CONNECTING 12X1/4 (SUCTIONS) ×1 IMPLANT
TUBING UROLOGY SET (TUBING) ×1 IMPLANT

## 2024-01-14 NOTE — Transfer of Care (Signed)
 Immediate Anesthesia Transfer of Care Note  Patient: [name]  Procedure(s) Performed: CYSTOSCOPY, WITH RETROGRADE PYELOGRAM AND URETERAL STENT INSERTION (Left: Ureter)  Patient Location: PACU  Anesthesia Type:MAC  Level of Consciousness: awake, alert , and oriented  Airway & Oxygen Therapy: Patient Spontanous Breathing and Patient connected to nasal cannula oxygen  Post-op Assessment: Report given to RN, Post -op Vital signs reviewed and stable, and Patient moving all extremities  Post vital signs: Reviewed and stable  Last Vitals:  Vitals Value Taken Time  BP 126/56 01/14/24 21:39  Temp    Pulse 72 01/14/24 21:43  Resp 22 01/14/24 21:43  SpO2 97 % 01/14/24 21:43  Vitals shown include unfiled device data.  Last Pain:  Vitals:   01/14/24 0800  TempSrc:   PainSc: 0-No pain         Complications: No notable events documented.

## 2024-01-14 NOTE — Anesthesia Preprocedure Evaluation (Addendum)
 Anesthesia Evaluation  Patient identified by MRN, date of birth, ID band Patient awake    Reviewed: Allergy  & Precautions, NPO status , Patient's Chart, lab work & pertinent test results  Airway Mallampati: II  TM Distance: >3 FB Neck ROM: Full    Dental  (+) Dental Advisory Given   Pulmonary COPD, Current Smoker   breath sounds clear to auscultation       Cardiovascular negative cardio ROS  Rhythm:Regular Rate:Normal     Neuro/Psych negative neurological ROS     GI/Hepatic Neg liver ROS,GERD  ,,  Endo/Other  negative endocrine ROS    Renal/GU ARFRenal disease     Musculoskeletal  (+) Arthritis ,    Abdominal   Peds  Hematology  (+) Blood dyscrasia, anemia   Anesthesia Other Findings   Reproductive/Obstetrics                             Anesthesia Physical Anesthesia Plan  ASA: 3  Anesthesia Plan: MAC   Post-op Pain Management:    Induction:   PONV Risk Score and Plan: 1 and Propofol  infusion and Ondansetron  Airway Management Planned: Natural Airway and Simple Face Mask  Additional Equipment:   Intra-op Plan:   Post-operative Plan:   Informed Consent: I have reviewed the patients History and Physical, chart, labs and discussed the procedure including the risks, benefits and alternatives for the proposed anesthesia with the patient or authorized representative who has indicated his/her understanding and acceptance.       Plan Discussed with:   Anesthesia Plan Comments:        Anesthesia Quick Evaluation

## 2024-01-14 NOTE — Progress Notes (Addendum)
 PROGRESS NOTE    [name]  FMW:[id number] DOB: [birth year] DOA: 01/13/2024 PCP: Patti Mliss LABOR, FNP  Chief Complaint  Patient presents with   Weakness    Brief Narrative:    [name] is [name] 83 y.o. female with medical history significant of vascular dementia, overactive bladder with incontinence, history of TIA, hearing loss, and recent admission in 09/2023 for AMS iso Enterococcus bacteremia/UTI who is admitted acute encephalopathy.   She's been admitted with sepsis due to [name] UTI.  Assessment & Plan:   Principal Problem:   Sepsis due to gram-negative urinary tract infection (HCC)  Sepsis due to Urinary Tract Infection  Presented with fever, tachypnea.  Rules in for sepsis.  Urine culture with enterococcus. Sensitivities pending Blood cultures pending Follow CT abd/pelvis given N/V this am  Addendum: L hydro, urology has been consulted.  NPO for now.   Addendum: Volume overload, CT with bilateral effusions, anasarca, free fluid in pelvis.  Follow BNP.  UA has 100 mg/dl protein - would reevaluate after UTI treated.  LFT's wnl.  Hold additional IVF for now.  Echo in march with normal EF.  Repeat limited echo for EF.  Nausea  Vomiting Suspect related to UTI above Follow CT abd/pelvis   AKI Baseline creatinine around 1.1 Improved, follow with IVF   Vascular Dementia Anxiety Lexapro , donepezil , memantine  Reportedly taking xanax  qam (will resume this prn) Delirium precautions     DVT prophylaxis: lovenox  Code Status: full Family Communication: husband over phone Disposition:   Status is: Inpatient Remains inpatient appropriate because: need for ongoing inpatient care   Consultants:  none  Procedures:  none  Antimicrobials:  Anti-infectives (From admission, onward)    Start     Dose/Rate Route Frequency Ordered Stop   01/14/24 1000  cefTRIAXone  (ROCEPHIN ) 1 g in sodium chloride  0.9 % 100 mL IVPB        1 g 200 mL/hr over 30 Minutes Intravenous  Every 24 hours 01/13/24 0738 01/18/24 0959   01/13/24 1400  ampicillin  (OMNIPEN) 1 g in sodium chloride  0.9 % 100 mL IVPB  Status:  Discontinued        1 g 300 mL/hr over 20 Minutes Intravenous Every 8 hours 01/13/24 1009 01/13/24 1106   01/13/24 1200  ampicillin  (OMNIPEN) 1 g in sodium chloride  0.9 % 100 mL IVPB        1 g 300 mL/hr over 20 Minutes Intravenous Every 12 hours 01/13/24 1106     01/13/24 0230  cefTRIAXone  (ROCEPHIN ) 2 g in sodium chloride  0.9 % 100 mL IVPB        2 g 200 mL/hr over 30 Minutes Intravenous Once 01/13/24 0220 01/13/24 0347       Subjective: Vomiting, says she hasn't had [name] chance to eat yet  Objective: Vitals:   01/13/24 1753 01/13/24 2016 01/14/24 0347 01/14/24 0831  BP: (!) 122/52 (!) 120/51 (!) 123/50 119/65  Pulse: 72 77 73 73  Resp: 18 16 16    Temp: 98.6 F (37 C) 98.6 F (37 C) 99.3 F (37.4 C) 98 F (36.7 C)  TempSrc: Oral Oral Oral   SpO2: 96% 98% 94% 90%  Weight:      Height:        Intake/Output Summary (Last 24 hours) at 01/14/2024 1459 Last data filed at 01/14/2024 0500 Gross per 24 hour  Intake 200 ml  Output --  Net 200 ml   Filed Weights   01/13/24 0210  Weight: 44.5 kg  Examination:  General exam: Appears uncomfortable, actively vomiting orange emesis Respiratory system: Clear to auscultation. Respiratory effort normal. Cardiovascular system: RRR Gastrointestinal system: Abdomen is nondistended, soft and nontender. Central nervous system: Alert and oriented. No focal neurological deficits. Extremities: no LEE  Data Reviewed: I have personally reviewed following labs and imaging studies  CBC: Recent Labs  Lab 01/13/24 0242  WBC 10.8*  NEUTROABS 8.4*  HGB 10.5*  HCT 32.2*  MCV 93.9  PLT 281    Basic Metabolic Panel: Recent Labs  Lab 01/13/24 0242 01/14/24 0508  NA 135 135  K 3.9 3.5  CL 98 108  CO2 26 20*  GLUCOSE 125* 110*  BUN 23 16  CREATININE 1.35* 1.14*  CALCIUM 9.0 7.9*     GFR: Estimated Creatinine Clearance: 25.1 mL/min ([name]) (by C-G formula based on SCr of 1.14 mg/dL (H)).  Liver Function Tests: Recent Labs  Lab 01/13/24 0242  AST 20  ALT 12  ALKPHOS 44  BILITOT 0.3  PROT 7.0  ALBUMIN 3.3*    CBG: No results for input(s): GLUCAP in the last 168 hours.   Recent Results (from the past 240 hours)  Resp panel by RT-PCR (RSV, Flu [name], Covid) Anterior Nasal Swab     Status: None   Collection Time: 01/13/24  2:20 AM   Specimen: Anterior Nasal Swab  Result Value Ref Range Status   SARS Coronavirus 2 by RT PCR NEGATIVE NEGATIVE Final   Influenza [name] by PCR NEGATIVE NEGATIVE Final   Influenza B by PCR NEGATIVE NEGATIVE Final    Comment: (NOTE) The Xpert Xpress SARS-CoV-2/FLU/RSV plus assay is intended as an aid in the diagnosis of influenza from Nasopharyngeal swab specimens and should not be used as [name] sole basis for treatment. Nasal washings and aspirates are unacceptable for Xpert Xpress SARS-CoV-2/FLU/RSV testing.  Fact Sheet for Patients: [name]  Fact Sheet for Healthcare Providers: [name]  This test is not yet approved or cleared by the [place]  FDA and has been authorized for detection and/or diagnosis of SARS-CoV-2 by FDA under an Emergency Use Authorization (EUA). This EUA will remain in effect (meaning this test can be used) for the duration of the COVID-19 declaration under Section 564(b)(1) of the Act, 21 U.S.C. section 360bbb-3(b)(1), unless the authorization is terminated or revoked.     Resp Syncytial Virus by PCR NEGATIVE NEGATIVE Final    Comment: (NOTE) Fact Sheet for Patients: [name]  Fact Sheet for Healthcare Providers: [name]  This test is not yet approved or cleared by the [place]  FDA and has been authorized for detection and/or diagnosis of SARS-CoV-2 by FDA  under an Emergency Use Authorization (EUA). This EUA will remain in effect (meaning this test can be used) for the duration of the COVID-19 declaration under Section 564(b)(1) of the Act, 21 U.S.C. section 360bbb-3(b)(1), unless the authorization is terminated or revoked.  Performed at [facility], [address], [place], [place] [address]   Blood Culture (routine x 2)     Status: None (Preliminary result)   Collection Time: 01/13/24  2:42 AM   Specimen: BLOOD  Result Value Ref Range Status   Specimen Description BLOOD LEFT ANTECUBITAL  Final   Special Requests   Final    BOTTLES DRAWN AEROBIC AND ANAEROBIC Blood Culture results may not be optimal due to an inadequate volume of blood received in culture bottles   Culture   Final    NO GROWTH 1 DAY Performed at [facility], [address] [place], [place] [address]    Report Status PENDING  Incomplete  Blood Culture (routine x 2)     Status: None (Preliminary result)   Collection Time: 01/13/24  2:42 AM   Specimen: BLOOD LEFT FOREARM  Result Value Ref Range Status   Specimen Description BLOOD LEFT FOREARM  Final   Special Requests   Final    BOTTLES DRAWN AEROBIC AND ANAEROBIC Blood Culture results may not be optimal due to an inadequate volume of blood received in culture bottles   Culture   Final    NO GROWTH 1 DAY Performed at [facility], [address], [place], [place] [address]    Report Status PENDING  Incomplete  Urine Culture     Status: Abnormal (Preliminary result)   Collection Time: 01/13/24  4:00 AM   Specimen: Urine, Random  Result Value Ref Range Status   Specimen Description URINE, RANDOM  Final   Special Requests NONE Reflexed from [id number]  Final   Culture (Emmaleah Meroney)  Final    >=100,000 COLONIES/mL ENTEROCOCCUS FAECALIS SUSCEPTIBILITIES TO FOLLOW Performed at [facility], [address] [place], [place] [address]    Report Status PENDING  Incomplete         Radiology  Studies: DG Chest Port 1 View Result Date: 01/13/2024 CLINICAL DATA:  Sepsis EXAM: PORTABLE CHEST 1 VIEW COMPARISON:  10/14/2023 FINDINGS: The heart size and mediastinal contours are within normal limits. Both lungs are clear. The visualized skeletal structures are unremarkable. IMPRESSION: No active disease. Electronically Signed   By: Dorethia Molt M.D.   On: 01/13/2024 02:49        Scheduled Meds:  aspirin  EC  81 mg Oral Daily   donepezil   10 mg Oral q AM   enoxaparin  (LOVENOX ) injection  20 mg Subcutaneous Q24H   escitalopram   20 mg Oral q AM   memantine   5 mg Oral BID   multivitamin  1 tablet Oral Daily   nicotine   14 mg Transdermal Daily   Continuous Infusions:  ampicillin  (OMNIPEN) IV 1 g (01/14/24 1340)   cefTRIAXone  (ROCEPHIN )  IV 1 g (01/14/24 0952)   lactated ringers  75 mL/hr at 01/14/24 1341     LOS: 1 day    Time spent: over 30 min    Meliton Monte, MD [facility]   To contact the attending provider between 7A-7P or the covering provider during after hours 7P-7A, please log into the web site [email or web address] and access using universal [place] password for that web site. If you do not have the password, please call the hospital operator.  01/14/2024, 2:59 PM

## 2024-01-14 NOTE — Op Note (Signed)
 Preoperative diagnosis:  Left obstructing ureteral calculus UTI with sepsis   Postoperative diagnosis:  Left obstructing ureteral calculus UTI with sepsis   Procedure:  Cystoscopy left ureteral stent placement   Surgeon: Valli Shank, MD  Anesthesia: MAC  Complications: None  Intraoperative findings:   Normal urethra Bilateral orthotopic Uos Purulent efflux from left UO after wire was passed 6Fr x 24cm - no string left ureteral stent  EBL: Minimal  Specimens: None  Indication: [name] is a 83 y.o. patient with 9mm left obstructing proximal ureteral calculus with UTI. After reviewing the management options for treatment, he elected to proceed with the above surgical procedure(s). We have discussed the potential benefits and risks of the procedure, side effects of the proposed treatment, the likelihood of the patient achieving the goals of the procedure, and any potential problems that might occur during the procedure or recuperation. Informed consent has been obtained.  Description of procedure:  The patient was taken to the operating room and general anesthesia was induced.  The patient was placed in the dorsal lithotomy position, prepped and draped in the usual sterile fashion, and preoperative antibiotics were administered. A preoperative time-out was performed.   Cystourethroscopy was performed.  The patient's urethra was examined and was normal. The bladder was then systematically examined in its entirety. There was no evidence for any bladder tumors, stones, or other mucosal pathology.    Attention then turned to the left ureteral orifice and a ureteral catheter was used to intubate the ureteral orifice.  A 0.38 sensor guidewire was then advanced up the left ureter into the renal pelvis under fluoroscopic guidance.  The ureteral stent was advanced over the wire using Seldinger technique.  The stent was positioned appropriately under fluoroscopic and cystoscopic  guidance.  The wire was then removed with an adequate stent curl noted in the renal pelvis as well as in the bladder.  The bladder was then emptied and the procedure ended.  Due to purulent efflux, a foley catheter was left in place. The patient appeared to tolerate the procedure well and without complications.  The patient was able to be awakened and transferred to the recovery unit in satisfactory condition.   Plan: Continue maximum drainage with foley for 24 hours.  Valli Shank, M.D.

## 2024-01-14 NOTE — Anesthesia Postprocedure Evaluation (Signed)
 Anesthesia Post Note  Patient: [name]  Procedure(s) Performed: CYSTOSCOPY, WITH RETROGRADE PYELOGRAM AND URETERAL STENT INSERTION (Left: Ureter)     Patient location during evaluation: PACU Anesthesia Type: MAC Level of consciousness: awake and alert Pain management: pain level controlled Vital Signs Assessment: post-procedure vital signs reviewed and stable Respiratory status: spontaneous breathing, nonlabored ventilation, respiratory function stable and patient connected to nasal cannula oxygen Cardiovascular status: stable and blood pressure returned to baseline Postop Assessment: no apparent nausea or vomiting Anesthetic complications: no  No notable events documented.  Last Vitals:  Vitals:   01/14/24 2145 01/14/24 2200  BP: (!) 118/52 (!) 117/52  Pulse: 70 71  Resp: 17 (!) 25  Temp:  37 C  SpO2: 98% 97%    Last Pain:  Vitals:   01/14/24 2200  TempSrc:   PainSc: 0-No pain                 Epifanio Lamar BRAVO

## 2024-01-14 NOTE — Plan of Care (Signed)

## 2024-01-14 NOTE — Consult Note (Signed)
 I have been asked to see the patient by Dr. Meliton Monte for evaluation and management of obstructing left ureteral calculus and UTI.  History of present illness: 83 yo woman with history of vascular dementia and recent hospital admission for Enterococcus bacteremia admitted for acute encephalopathy.  Patient was febrile and hypotensive in the ED yesterday.  Creatinine was elevated at 1.35.  Urinalysis was obtained but CT scan was not done till today.  CT of the abdomen pelvis shows a left 9 mm UPJ calculus.   Review of systems: A 12 point comprehensive review of systems was obtained and is negative unless otherwise stated in the history of present illness.  Patient Active Problem List   Diagnosis Date Noted   Sepsis due to gram-negative urinary tract infection (HCC) 01/13/2024   Bacteremia 10/17/2023   UTI (urinary tract infection) 10/14/2023   Cerebrovascular disease 02/04/2023   Moderate vascular dementia without behavioral disturbance, psychotic disturbance, mood disturbance, or anxiety (HCC) 02/04/2023   Memory loss 12/30/2022   Unsteady gait 12/30/2022   Atopic dermatitis 02/12/2021   Gastro-esophageal reflux disease without esophagitis 02/12/2021   Gingivitis 02/12/2021   Impairment of balance 02/12/2021   Irritable bowel syndrome with diarrhea 02/12/2021   Major depressive disorder 02/12/2021   Plantar wart of right foot 02/12/2021   Urinary hesitancy 02/12/2021   Chondrodermatitis nodularis helicis of left ear 12/06/2020   Allergic urticaria 01/25/2020   Perennial allergic rhinitis 01/25/2020   Perennial allergic rhinitis 01/25/2020   Hematuria 02/22/2019   Degenerative disc disease, lumbar 04/17/2018   Degenerative scoliosis in adult patient 04/17/2018   SI joint arthritis (HCC) 04/17/2018   Degenerative disc disease, cervical 09/09/2017   Spondylolisthesis of cervical region 09/09/2017   Chronic swimmer's ear of both sides 06/03/2017   Tobacco use 05/06/2017    COPD (chronic obstructive pulmonary disease) (HCC) 05/06/2017   History of renal calculi 01/14/2017   Anxiety 09/05/2016   Renal stone 09/05/2016   Hoarseness 06/20/2016   Laryngopharyngeal reflux (LPR) 06/20/2016    No current facility-administered medications on file prior to encounter.   Current Outpatient Medications on File Prior to Encounter  Medication Sig Dispense Refill   ALPRAZolam  (XANAX ) 0.25 MG tablet Take 1 tablet (0.25 mg total) by mouth daily as needed for anxiety. (Patient taking differently: Take 0.25 mg by mouth in the morning.) 10 tablet 0   aspirin  EC 81 MG tablet Take 81 mg by mouth daily.     BLINK TEARS 0.25 % SOLN Place 1 drop into both eyes in the morning.     Calcium-Vitamin D-Vitamin K 650-12.5-40 MG-MCG-MCG CHEW Chew 1 tablet by mouth daily.     cyanocobalamin  (VITAMIN B12) 1000 MCG tablet Take 1,000 mcg by mouth daily.     donepezil  (ARICEPT ) 10 MG tablet Take 10 mg by mouth in the morning.     escitalopram  (LEXAPRO ) 20 MG tablet Take 20 mg by mouth in the morning.     fluticasone  (FLONASE ) 50 MCG/ACT nasal spray 1-2 sprays per nostril daily if needed 16 g 1   Ibuprofen 200 MG CAPS Take 200 mg by mouth every 6 (six) hours as needed (for pain or headaches).     memantine  (NAMENDA ) 5 MG tablet Take 5 mg by mouth in the morning and at bedtime.     mirabegron  ER (MYRBETRIQ ) 50 MG TB24 tablet Take 50 mg by mouth in the morning.     Multiple Vitamins-Minerals (ONE A DAY WOMEN 50 PLUS PO) Take 1 tablet by  mouth daily after breakfast.     Multiple Vitamins-Minerals (PRESERVISION AREDS 2) CAPS Take 1 capsule by mouth 2 (two) times daily.     Probiotic Product (PROBIOTIC PO) Take 1 capsule by mouth daily.     TYLENOL  500 MG tablet Take 500 mg by mouth every 6 (six) hours as needed for mild pain (pain score 1-3) or headache.     nicotine  (NICODERM CQ  - DOSED IN MG/24 HOURS) 21 mg/24hr patch Place 1 patch (21 mg total) onto the skin daily. (Patient not taking: Reported on  01/13/2024)      History reviewed. No pertinent past medical history.  Past Surgical History:  Procedure Laterality Date   CHOLECYSTECTOMY  10/24/2010   COLECTOMY  2006   BENIGN TUMOR   TRANSESOPHAGEAL ECHOCARDIOGRAM (CATH LAB) N/A 10/17/2023   Procedure: TRANSESOPHAGEAL ECHOCARDIOGRAM;  Surgeon: Pietro Redell RAMAN, MD;  Location: [facility] INVASIVE CV LAB;  Service: Cardiovascular;  Laterality: N/A;    Social History   Tobacco Use   Smoking status: Every Day    Current packs/day: 1.00    Average packs/day: 1 pack/day for 50.0 years (50.0 ttl pk-yrs)    Types: Cigarettes   Smokeless tobacco: Never  Vaping Use   Vaping status: Never Used  Substance Use Topics   Alcohol  use: No   Drug use: No    Family History  Problem Relation Age of Onset   Cancer Brother    Diabetes Brother    Heart disease Brother    Cancer Sister    Diabetes Sister    Breast cancer Neg Hx     PE: Vitals:   01/13/24 1753 01/13/24 2016 01/14/24 0347 01/14/24 0831  BP: (!) 122/52 (!) 120/51 (!) 123/50 119/65  Pulse: 72 77 73 73  Resp: 18 16 16    Temp: 98.6 F (37 C) 98.6 F (37 C) 99.3 F (37.4 C) 98 F (36.7 C)  TempSrc: Oral Oral Oral   SpO2: 96% 98% 94% 90%  Weight:      Height:       Patient appears to be in no acute distress  patient is alert and oriented x3 Atraumatic normocephalic head No cervical or supraclavicular lymphadenopathy appreciated No increased work of breathing, no audible wheezes/rhonchi Regular sinus rhythm/rate Abdomen is soft, nontender, nondistended, no CVA or suprapubic tenderness Lower extremities are symmetric without appreciable edema Grossly neurologically intact No identifiable skin lesions  Recent Labs    01/13/24 0242  WBC 10.8*  HGB 10.5*  HCT 32.2*   Recent Labs    01/13/24 0242 01/14/24 0508  NA 135 135  K 3.9 3.5  CL 98 108  CO2 26 20*  GLUCOSE 125* 110*  BUN 23 16  CREATININE 1.35* 1.14*  CALCIUM 9.0 7.9*   Recent Labs    01/13/24 0242   INR 1.0   No results for input(s): LABURIN in the last 72 hours. Results for orders placed or performed during the hospital encounter of 01/13/24  Resp panel by RT-PCR (RSV, Flu A&B, Covid) Anterior Nasal Swab     Status: None   Collection Time: 01/13/24  2:20 AM   Specimen: Anterior Nasal Swab  Result Value Ref Range Status   SARS Coronavirus 2 by RT PCR NEGATIVE NEGATIVE Final   Influenza A by PCR NEGATIVE NEGATIVE Final   Influenza B by PCR NEGATIVE NEGATIVE Final    Comment: (NOTE) The Xpert Xpress SARS-CoV-2/FLU/RSV plus assay is intended as an aid in the diagnosis of influenza from Nasopharyngeal swab specimens  and should not be used as a sole basis for treatment. Nasal washings and aspirates are unacceptable for Xpert Xpress SARS-CoV-2/FLU/RSV testing.  Fact Sheet for Patients: [name]  Fact Sheet for Healthcare Providers: [name]  This test is not yet approved or cleared by the [place]  FDA and has been authorized for detection and/or diagnosis of SARS-CoV-2 by FDA under an Emergency Use Authorization (EUA). This EUA will remain in effect (meaning this test can be used) for the duration of the COVID-19 declaration under Section 564(b)(1) of the Act, 21 U.S.C. section 360bbb-3(b)(1), unless the authorization is terminated or revoked.     Resp Syncytial Virus by PCR NEGATIVE NEGATIVE Final    Comment: (NOTE) Fact Sheet for Patients: [name]  Fact Sheet for Healthcare Providers: [name]  This test is not yet approved or cleared by the [place]  FDA and has been authorized for detection and/or diagnosis of SARS-CoV-2 by FDA under an Emergency Use Authorization (EUA). This EUA will remain in effect (meaning this test can be used) for the duration of the COVID-19 declaration under Section 564(b)(1) of the Act, 21  U.S.C. section 360bbb-3(b)(1), unless the authorization is terminated or revoked.  Performed at [facility], [address], [place], [place] [address]   Blood Culture (routine x 2)     Status: None (Preliminary result)   Collection Time: 01/13/24  2:42 AM   Specimen: BLOOD  Result Value Ref Range Status   Specimen Description BLOOD LEFT ANTECUBITAL  Final   Special Requests   Final    BOTTLES DRAWN AEROBIC AND ANAEROBIC Blood Culture results may not be optimal due to an inadequate volume of blood received in culture bottles   Culture   Final    NO GROWTH 1 DAY Performed at [facility], [address] [place], [place] [address]    Report Status PENDING  Incomplete  Blood Culture (routine x 2)     Status: None (Preliminary result)   Collection Time: 01/13/24  2:42 AM   Specimen: BLOOD LEFT FOREARM  Result Value Ref Range Status   Specimen Description BLOOD LEFT FOREARM  Final   Special Requests   Final    BOTTLES DRAWN AEROBIC AND ANAEROBIC Blood Culture results may not be optimal due to an inadequate volume of blood received in culture bottles   Culture   Final    NO GROWTH 1 DAY Performed at [facility], [address], [place], [place] [address]    Report Status PENDING  Incomplete  Urine Culture     Status: Abnormal (Preliminary result)   Collection Time: 01/13/24  4:00 AM   Specimen: Urine, Random  Result Value Ref Range Status   Specimen Description URINE, RANDOM  Final   Special Requests NONE Reflexed from [id number]  Final   Culture (A)  Final    >=100,000 COLONIES/mL ENTEROCOCCUS FAECALIS SUSCEPTIBILITIES TO FOLLOW Performed at [facility], [address], [place], [place] [address]    Report Status PENDING  Incomplete    Imaging: CT Abd/Pelvis  IMPRESSION: 1. Moderate left-sided hydroureteronephrosis due to an obstructive calculus at the left UVJ, measuring 9 x 8 x 9 mm. Correlation with urinalysis recommended to exclude superimposed  infection. 2. Nonobstructive calculi in the right kidney, including a 6 mm renal pelvis calculus. No hydronephrosis. 3. Small amount of layering fluid in the distal esophagus, possibly due to incomplete emptying or gastroesophageal reflux disease. 4. Bilateral pleural effusions, moderate in volume on the  right and small to moderate on the left. Anasarca and small volume free fluid in the pelvis, all likely related to patient's volume status. 5. Total colonic diverticulosis. No changes of acute diverticulitis.   Aortic Atherosclerosis (ICD10-I70.0).     Electronically Signed   By: Rogelia Myers M.D.   On: 01/14/2024 17:05  Assessment/Plan:  9mm left proximal ureteral calculus with sepsis secondary to UTI:  -risks and benefits of cystoscopy with left ureteral stent placement discussed with patient and her husband Butch. - She understands that this will be a staged procedure and she will need to return for definitive removal of stone after she has been treated for infection - to OR tonight for left ureteral stent placement  Thank you for involving me in this patient's care, I will continue to follow along.Please page with any further questions or concerns. Amarilis Belflower D Enda Santo

## 2024-01-15 ENCOUNTER — Encounter (HOSPITAL_COMMUNITY): Payer: Self-pay | Admitting: Urology

## 2024-01-15 ENCOUNTER — Inpatient Hospital Stay (HOSPITAL_COMMUNITY)

## 2024-01-15 DIAGNOSIS — N390 Urinary tract infection, site not specified: Secondary | ICD-10-CM | POA: Diagnosis not present

## 2024-01-15 DIAGNOSIS — A4150 Gram-negative sepsis, unspecified: Secondary | ICD-10-CM | POA: Diagnosis not present

## 2024-01-15 DIAGNOSIS — I501 Left ventricular failure: Secondary | ICD-10-CM | POA: Diagnosis not present

## 2024-01-15 LAB — CBC WITH DIFFERENTIAL/PLATELET
Abs Immature Granulocytes: 0.05 10*3/uL (ref 0.00–0.07)
Basophils Absolute: 0.1 10*3/uL (ref 0.0–0.1)
Basophils Relative: 1 %
Eosinophils Absolute: 0 10*3/uL (ref 0.0–0.5)
Eosinophils Relative: 0 %
HCT: 26.4 % — ABNORMAL LOW (ref 36.0–46.0)
Hemoglobin: 8.6 g/dL — ABNORMAL LOW (ref 12.0–15.0)
Immature Granulocytes: 1 %
Lymphocytes Relative: 7 %
Lymphs Abs: 0.8 10*3/uL (ref 0.7–4.0)
MCH: 30.4 pg (ref 26.0–34.0)
MCHC: 32.6 g/dL (ref 30.0–36.0)
MCV: 93.3 fL (ref 80.0–100.0)
Monocytes Absolute: 0.9 10*3/uL (ref 0.1–1.0)
Monocytes Relative: 8 %
Neutro Abs: 9.1 10*3/uL — ABNORMAL HIGH (ref 1.7–7.7)
Neutrophils Relative %: 83 %
Platelets: 256 10*3/uL (ref 150–400)
RBC: 2.83 MIL/uL — ABNORMAL LOW (ref 3.87–5.11)
RDW: 14.3 % (ref 11.5–15.5)
WBC: 10.9 10*3/uL — ABNORMAL HIGH (ref 4.0–10.5)
nRBC: 0 % (ref 0.0–0.2)

## 2024-01-15 LAB — COMPREHENSIVE METABOLIC PANEL WITH GFR
ALT: 12 U/L (ref 0–44)
AST: 15 U/L (ref 15–41)
Albumin: 2.3 g/dL — ABNORMAL LOW (ref 3.5–5.0)
Alkaline Phosphatase: 39 U/L (ref 38–126)
Anion gap: 8 (ref 5–15)
BUN: 15 mg/dL (ref 8–23)
CO2: 20 mmol/L — ABNORMAL LOW (ref 22–32)
Calcium: 7.9 mg/dL — ABNORMAL LOW (ref 8.9–10.3)
Chloride: 106 mmol/L (ref 98–111)
Creatinine, Ser: 1.29 mg/dL — ABNORMAL HIGH (ref 0.44–1.00)
GFR, Estimated: 41 mL/min — ABNORMAL LOW (ref >60)
Glucose, Bld: 98 mg/dL (ref 70–99)
Potassium: 3.5 mmol/L (ref 3.5–5.1)
Sodium: 134 mmol/L — ABNORMAL LOW (ref 135–145)
Total Bilirubin: 0.6 mg/dL (ref 0.0–1.2)
Total Protein: 5.7 g/dL — ABNORMAL LOW (ref 6.5–8.1)

## 2024-01-15 LAB — HEMOGLOBIN AND HEMATOCRIT, BLOOD
HCT: 26.4 % — ABNORMAL LOW (ref 36.0–46.0)
Hemoglobin: 8.9 g/dL — ABNORMAL LOW (ref 12.0–15.0)

## 2024-01-15 LAB — ECHOCARDIOGRAM LIMITED
Height: 59 in
S' Lateral: 2.8 cm
Weight: 1615.53 [oz_av]

## 2024-01-15 LAB — PHOSPHORUS: Phosphorus: 2.7 mg/dL (ref 2.5–4.6)

## 2024-01-15 LAB — CULTURE, BLOOD (ROUTINE X 2): Culture: NO GROWTH

## 2024-01-15 LAB — URINE CULTURE: Culture: >=100000 — AB

## 2024-01-15 LAB — MAGNESIUM: Magnesium: 1.6 mg/dL — ABNORMAL LOW (ref 1.7–2.4)

## 2024-01-15 LAB — BRAIN NATRIURETIC PEPTIDE: B Natriuretic Peptide: 774 pg/mL — ABNORMAL HIGH (ref 0.0–100.0)

## 2024-01-15 MED ORDER — CHLORHEXIDINE GLUCONATE CLOTH 2 % EX PADS
6.0000 | MEDICATED_PAD | Freq: Every day | CUTANEOUS | Status: DC
  Administered 2024-01-15 – 2024-01-16 (×2): 6 via TOPICAL

## 2024-01-15 MED ORDER — MAGNESIUM SULFATE 2 GM/50ML IV SOLN
2.0000 g | Freq: Once | INTRAVENOUS | Status: AC
  Administered 2024-01-15: 2 g via INTRAVENOUS
  Filled 2024-01-15: qty 50

## 2024-01-15 MED ORDER — NICOTINE 21 MG/24HR TD PT24
21.0000 mg | MEDICATED_PATCH | Freq: Every day | TRANSDERMAL | Status: DC
  Administered 2024-01-15 – 2024-01-16 (×2): 21 mg via TRANSDERMAL
  Filled 2024-01-15 (×2): qty 1

## 2024-01-15 NOTE — Evaluation (Signed)
 Physical Therapy Evaluation Patient Details Name: [name] MRN: [id number] DOB: Nov 27, 1940 Today's Date: 01/15/2024  History of Present Illness  Patient is a 83 year old female who presents 6/24 with acute encephalopathy.  Pt with obstructing stone now s/p left ureteral stent placement 6/25. PMH: vascular dementia, over active bladder, TIA, hearing loss.  Clinical Impression  PTA, pt lives with her spouse, is modI ambulation (recently using Rollator due to increased weakness/malaise), and requires assist for IADL's due to vascular dementia. Pt agreeable to participate with encouragement. Overall is mobilizing fairly. Ambulating 30 ft with a Rollator and CGA. Further distance limited due to bowel incontinence and assisted to bathroom. Pt would benefit from follow up HHPT to address deficits and maximize functional mobility.      If plan is discharge home, recommend the following: A little help with walking and/or transfers;A little help with bathing/dressing/bathroom;Assistance with cooking/housework;Assist for transportation;Help with stairs or ramp for entrance;Supervision due to cognitive status   Can travel by private vehicle        Equipment Recommendations None recommended by PT  Recommendations for Other Services       Functional Status Assessment Patient has had a recent decline in their functional status and demonstrates the ability to make significant improvements in function in a reasonable and predictable amount of time.     Precautions / Restrictions Precautions Precautions: Fall Precaution/Restrictions Comments: Bowel incontinence (wears depends) Restrictions Weight Bearing Restrictions Per Provider Order: No      Mobility  Bed Mobility Overal bed mobility: Needs Assistance Bed Mobility: Supine to Sit, Sit to Supine     Supine to sit: Supervision Sit to supine: Min assist   General bed mobility comments: Increased time, no physical assist to exit bed.  MinA for LE elevation back into bed    Transfers Overall transfer level: Needs assistance Equipment used: Rollator (4 wheels) Transfers: Sit to/from Stand Sit to Stand: Contact guard assist, Min assist           General transfer comment: CGA from edge of bed, minA from low toilet seat    Ambulation/Gait Ambulation/Gait assistance: Contact guard assist Gait Distance (Feet): 30 Feet Assistive device: Rollator (4 wheels) Gait Pattern/deviations: Step-through pattern, Decreased stride length, Decreased dorsiflexion - right, Decreased dorsiflexion - left, Shuffle Gait velocity: decreased Gait velocity interpretation: <1.31 ft/sec, indicative of household ambulator   General Gait Details: Shuffling gait (pt spouse states this is baseline), CGA for safety  Stairs            Wheelchair Mobility     Tilt Bed    Modified Rankin (Stroke Patients Only)       Balance Overall balance assessment: Needs assistance Sitting-balance support: No upper extremity supported, Feet supported Sitting balance-Leahy Scale: Good     Standing balance support: Single extremity supported, During functional activity Standing balance-Leahy Scale: Fair                               Pertinent Vitals/Pain Pain Assessment Pain Assessment: No/denies pain    Home Living Family/patient expects to be discharged to:: Private residence Living Arrangements: Spouse/significant other Available Help at Discharge: Family;Available 24 hours/day Type of Home: House Home Access: Stairs to enter Entrance Stairs-Rails: Doctor, general practice of Steps: 4   Home Layout: One level Home Equipment: Grab bars - tub/shower;Educational psychologist (4 wheels) Additional Comments: Pt lives with husband who is retired    Air cabin crew Prior  Level of Function : Independent/Modified Independent             Mobility Comments: Able to walk around in yard, picking up sticks. Started  using Rollator 2 days ago due to weakness/malaise. Shuffling gait in past week according to spouse ADLs Comments: Independent ADL's, spouse helps with household chores and cooking and medication management (pt prepares medications and dispenses them)     Extremity/Trunk Assessment   Upper Extremity Assessment Upper Extremity Assessment: Overall WFL for tasks assessed    Lower Extremity Assessment Lower Extremity Assessment: Generalized weakness    Cervical / Trunk Assessment Cervical / Trunk Assessment: Kyphotic  Communication   Communication Communication: Impaired Factors Affecting Communication: Hearing impaired    Cognition Arousal: Alert Behavior During Therapy: WFL for tasks assessed/performed   PT - Cognitive impairments: History of cognitive impairments                       PT - Cognition Comments: Hx of vascular dementia, pt able to follow commands with increased time Following commands: Intact       Cueing Cueing Techniques: Verbal cues     General Comments      Exercises     Assessment/Plan    PT Assessment Patient needs continued PT services  PT Problem List Decreased strength;Decreased activity tolerance;Decreased balance;Decreased mobility;Decreased cognition;Decreased safety awareness       PT Treatment Interventions DME instruction;Gait training;Stair training;Functional mobility training;Therapeutic activities;Therapeutic exercise;Balance training;Patient/family education    PT Goals (Current goals can be found in the Care Plan section)  Acute Rehab PT Goals Patient Stated Goal: did not state PT Goal Formulation: With patient/family Time For Goal Achievement: 01/29/24 Potential to Achieve Goals: Fair    Frequency Min 2X/week     Co-evaluation               AM-PAC PT 6 Clicks Mobility  Outcome Measure Help needed turning from your back to your side while in a flat bed without using bedrails?: A Little Help needed  moving from lying on your back to sitting on the side of a flat bed without using bedrails?: A Little Help needed moving to and from a bed to a chair (including a wheelchair)?: A Little Help needed standing up from a chair using your arms (e.g., wheelchair or bedside chair)?: A Little Help needed to walk in hospital room?: A Little Help needed climbing 3-5 steps with a railing? : A Lot 6 Click Score: 17    End of Session Equipment Utilized During Treatment: Gait belt Activity Tolerance: Patient tolerated treatment well Patient left: in bed;with call bell/phone within reach;with bed alarm set;with family/visitor present Nurse Communication: Mobility status PT Visit Diagnosis: Unsteadiness on feet (R26.81);Muscle weakness (generalized) (M62.81)    Time: 8955-8879 PT Time Calculation (min) (ACUTE ONLY): 36 min   Charges:   PT Evaluation $PT Eval Low Complexity: 1 Low PT Treatments $Therapeutic Activity: 8-22 mins PT General Charges $$ ACUTE PT VISIT: 1 Visit         Aleck Daring, PT, DPT Acute Rehabilitation Services Office [phone]   [name] 01/15/2024, 1:25 PM

## 2024-01-15 NOTE — Progress Notes (Signed)
 PROGRESS NOTE    [name]  FMW:[id number] DOB: 1941/05/02 DOA: 01/13/2024 PCP: Patti Mliss LABOR, FNP  Chief Complaint  Patient presents with   Weakness    Brief Narrative:    [name] is [name] 83 y.o. female with medical history significant of vascular dementia, overactive bladder with incontinence, history of TIA, hearing loss, and recent admission in 09/2023 for AMS iso Enterococcus bacteremia/UTI who is admitted acute encephalopathy.   She's been admitted with sepsis due to [name] UTI.  Assessment & Plan:   Principal Problem:   Sepsis due to gram-negative urinary tract infection (HCC)  Sepsis due to Urinary Tract Infection  UTI with obstructing stone Hydronephrosis  Presented with fever, tachypnea.  Ruled in for sepsis.  Urine culture with enterococcus. Sensitive to ampicillin   Blood cultures NGx2 CT with moderate L sided hydro with obstructive calculus at the L UVJ  Nausea  Vomiting Suspect related to UTI above Resolved today  Volume overload CT with bilateral effusions, anasarca, free fluid in pelvis.   BNP 774.  UA has 100 mg/dl protein - would reevaluate after UTI treated.  LFT's wnl.   Hold additional IVF for now.   Echo in march with normal EF.  Repeat limited echo for EF. Strict I/O, daily weights  AKI Fluctuating, will monitor for now  Vascular Dementia Anxiety Lexapro , donepezil , memantine  Reportedly taking xanax  qam (will resume this prn) Delirium precautions   Layering Fluid in Distal Esophagus Incomplete emptying vs GERD Follow outpatient  PPI  Diarrhea Chronic diarrhea, outpatient GI ordered C diff, GI path panel, fecal calprotectin, and pancreatic elastase.  Will obtain GI path panel, fecal calprotectin, pancreatic elastase while here.  Will hold off on C diff unless symptoms c/w this.     DVT prophylaxis: lovenox  Code Status: full Family Communication: husband over phone Disposition:   Status is: Inpatient Remains inpatient  appropriate because: need for ongoing inpatient care   Consultants:  none  Procedures:  none  Antimicrobials:  Anti-infectives (From admission, onward)    Start     Dose/Rate Route Frequency Ordered Stop   01/14/24 1000  cefTRIAXone  (ROCEPHIN ) 1 g in sodium chloride  0.9 % 100 mL IVPB  Status:  Discontinued        1 g 200 mL/hr over 30 Minutes Intravenous Every 24 hours 01/13/24 0738 01/14/24 1503   01/13/24 1400  ampicillin  (OMNIPEN) 1 g in sodium chloride  0.9 % 100 mL IVPB  Status:  Discontinued        1 g 300 mL/hr over 20 Minutes Intravenous Every 8 hours 01/13/24 1009 01/13/24 1106   01/13/24 1200  ampicillin  (OMNIPEN) 1 g in sodium chloride  0.9 % 100 mL IVPB        1 g 300 mL/hr over 20 Minutes Intravenous Every 12 hours 01/13/24 1106     01/13/24 0230  cefTRIAXone  (ROCEPHIN ) 2 g in sodium chloride  0.9 % 100 mL IVPB        2 g 200 mL/hr over 30 Minutes Intravenous Once 01/13/24 0220 01/13/24 0347       Subjective: Feels rough [name] little better than yesterday  Objective: Vitals:   01/14/24 2145 01/14/24 2200 01/15/24 0353 01/15/24 0822  BP: (!) 118/52 (!) 117/52 (!) 124/57 (!) 119/53  Pulse: 70 71 84 81  Resp: 17 (!) 25 18 18   Temp:  98.6 F (37 C) 98.2 F (36.8 C) 98.1 F (36.7 C)  TempSrc:    Oral  SpO2: 98% 97% 91% 90%  Weight:  Height:        Intake/Output Summary (Last 24 hours) at 01/15/2024 1047 Last data filed at 01/15/2024 0338 Gross per 24 hour  Intake 568.27 ml  Output 25 ml  Net 543.27 ml   Filed Weights   01/13/24 0210  Weight: 44.5 kg    Examination:  General: No acute distress. Cardiovascular: RRR Lungs: unlabored Abdomen: Soft, nontender, mild distension  Neurological: Alert and oriented 3. Moves all extremities 4. Cranial nerves II through XII grossly intact. Skin: Warm and dry. No rashes or lesions. Extremities: no LE edema, dependent edema to hips   Data Reviewed: I have personally reviewed following labs and imaging  studies  CBC: Recent Labs  Lab 01/13/24 0242 01/15/24 0419  WBC 10.8* 10.9*  NEUTROABS 8.4* 9.1*  HGB 10.5* 8.6*  HCT 32.2* 26.4*  MCV 93.9 93.3  PLT 281 256    Basic Metabolic Panel: Recent Labs  Lab 01/13/24 0242 01/14/24 0508 01/15/24 0419  NA 135 135 134*  K 3.9 3.5 3.5  CL 98 108 106  CO2 26 20* 20*  GLUCOSE 125* 110* 98  BUN 23 16 15   CREATININE 1.35* 1.14* 1.29*  CALCIUM 9.0 7.9* 7.9*  MG  --   --  1.6*  PHOS  --   --  2.7    GFR: Estimated Creatinine Clearance: 22.1 mL/min ([name]) (by C-G formula based on SCr of 1.29 mg/dL (H)).  Liver Function Tests: Recent Labs  Lab 01/13/24 0242 01/15/24 0419  AST 20 15  ALT 12 12  ALKPHOS 44 39  BILITOT 0.3 0.6  PROT 7.0 5.7*  ALBUMIN 3.3* 2.3*    CBG: No results for input(s): GLUCAP in the last 168 hours.   Recent Results (from the past 240 hours)  Resp panel by RT-PCR (RSV, Flu [name]&B, Covid) Anterior Nasal Swab     Status: None   Collection Time: 01/13/24  2:20 AM   Specimen: Anterior Nasal Swab  Result Value Ref Range Status   SARS Coronavirus 2 by RT PCR NEGATIVE NEGATIVE Final   Influenza [name] by PCR NEGATIVE NEGATIVE Final   Influenza B by PCR NEGATIVE NEGATIVE Final    Comment: (NOTE) The Xpert Xpress SARS-CoV-2/FLU/RSV plus assay is intended as an aid in the diagnosis of influenza from Nasopharyngeal swab specimens and should not be used as [name] sole basis for treatment. Nasal washings and aspirates are unacceptable for Xpert Xpress SARS-CoV-2/FLU/RSV testing.  Fact Sheet for Patients: [name]  Fact Sheet for Healthcare Providers: [name]  This test is not yet approved or cleared by the [place]  FDA and has been authorized for detection and/or diagnosis of SARS-CoV-2 by FDA under an Emergency Use Authorization (EUA). This EUA will remain in effect (meaning this test can be used) for the duration of the COVID-19 declaration  under Section 564(b)(1) of the Act, 21 U.S.C. section 360bbb-3(b)(1), unless the authorization is terminated or revoked.     Resp Syncytial Virus by PCR NEGATIVE NEGATIVE Final    Comment: (NOTE) Fact Sheet for Patients: [name]  Fact Sheet for Healthcare Providers: [name]  This test is not yet approved or cleared by the [place]  FDA and has been authorized for detection and/or diagnosis of SARS-CoV-2 by FDA under an Emergency Use Authorization (EUA). This EUA will remain in effect (meaning this test can be used) for the duration of the COVID-19 declaration under Section 564(b)(1) of the Act, 21 U.S.C. section 360bbb-3(b)(1), unless the authorization is terminated or revoked.  Performed at [facility]  Hospital Lab, [address] [place], [place] [address]   Blood Culture (routine x 2)     Status: None (Preliminary result)   Collection Time: 01/13/24  2:42 AM   Specimen: BLOOD  Result Value Ref Range Status   Specimen Description BLOOD LEFT ANTECUBITAL  Final   Special Requests   Final    BOTTLES DRAWN AEROBIC AND ANAEROBIC Blood Culture results may not be optimal due to an inadequate volume of blood received in culture bottles   Culture   Final    NO GROWTH 2 DAYS Performed at [facility], [address], [place], [place] [address]    Report Status PENDING  Incomplete  Blood Culture (routine x 2)     Status: None (Preliminary result)   Collection Time: 01/13/24  2:42 AM   Specimen: BLOOD LEFT FOREARM  Result Value Ref Range Status   Specimen Description BLOOD LEFT FOREARM  Final   Special Requests   Final    BOTTLES DRAWN AEROBIC AND ANAEROBIC Blood Culture results may not be optimal due to an inadequate volume of blood received in culture bottles   Culture   Final    NO GROWTH 2 DAYS Performed at [facility], [address] [place], [place] [address]    Report Status PENDING   Incomplete  Urine Culture     Status: Abnormal   Collection Time: 01/13/24  4:00 AM   Specimen: Urine, Random  Result Value Ref Range Status   Specimen Description URINE, RANDOM  Final   Special Requests   Final    NONE Reflexed from [id number] Performed at [facility], [address], [place], [place] [address]    Culture >=100,000 COLONIES/mL ENTEROCOCCUS FAECALIS (Dhanya Bogle)  Final   Report Status 01/15/2024 FINAL  Final   Organism ID, Bacteria ENTEROCOCCUS FAECALIS (Finnian Husted)  Final      Susceptibility   Enterococcus faecalis - MIC*    AMPICILLIN  <=2 SENSITIVE Sensitive     NITROFURANTOIN <=16 SENSITIVE Sensitive     VANCOMYCIN 1 SENSITIVE Sensitive     * >=100,000 COLONIES/mL ENTEROCOCCUS FAECALIS         Radiology Studies: CT ABDOMEN PELVIS W CONTRAST Addendum Date: 01/15/2024 ADDENDUM REPORT: 01/15/2024 08:14 ADDENDUM: There is Jonnell Hentges voice-recognition in the body and impression of the report. The obstructive calculus on the left is at the UPJ and not at the UVJ. Electronically Signed   By: Rogelia Myers M.D.   On: 01/15/2024 08:14   Result Date: 01/15/2024 CLINICAL DATA:  nausea and vomiting EXAM: CT ABDOMEN AND PELVIS WITH CONTRAST TECHNIQUE: Multidetector CT imaging of the abdomen and pelvis was performed using the standard protocol following bolus administration of intravenous contrast. RADIATION DOSE REDUCTION: This exam was performed according to the departmental dose-optimization program which includes automated exposure control, adjustment of the mA and/or kV according to patient size and/or use of iterative reconstruction technique. CONTRAST:  60mL OMNIPAQUE  IOHEXOL  350 MG/ML SOLN COMPARISON:  October 15, 2023, September 05, 2016 FINDINGS: Lower chest: No cardiomegaly. Moderate volume right and small to moderate volume left pleural effusions with bibasilar compressive atelectasis. Hepatobiliary: Small cyst in the right hepatic lobe.Cholecystectomy. Mild dilation of the intrahepatic and  extrahepatic bile ducts, likely related to the prior cholecystectomy. The portal veins are patent. Pancreas: No mass or main ductal dilation. No peripancreatic inflammation or fluid collection. Spleen: Normal size. No mass. Adrenals/Urinary Tract: No adrenal masses. No renal mass. Delayed perfusion of the left kidney with  moderate left-sided hydroureteronephrosis due to an obstructive calculus at the left UVJ, which measures 9 x 8 x 9 mm (APxTRxCC). Moderate bilateral perinephric stranding. Nonobstructive right calyceal calculus. In the right renal pelvis, there is Marsa Matteo nonobstructive calculus measuring 6 mm. The urinary bladder is distended without focal abnormality. Stomach/Bowel: Small amount of fluid layering in the distal esophagus. The stomach contains ingested material without focal abnormality. No small bowel wall thickening or inflammation. No small bowel obstruction.Postsurgical changes of an ileocecectomy. Descending and sigmoid colonic diverticulosis. No changes of acute diverticulitis. Vascular/Lymphatic: No aortic aneurysm. Diffuse aortoiliac atherosclerosis. No intraabdominal or pelvic lymphadenopathy. Reproductive: Age-related atrophy of the uterus and ovaries. No concerning adnexal mass. Calcified uterine fibroids.Small volume free fluid in the pelvis, which is likely related to patient's volume status. Other: No pneumoperitoneum, ascites, or mesenteric inflammation. Musculoskeletal: Osteopenia. Chronic compression fracture of L2. Age-indeterminate moderate compression fracture of T10. No retropulsion. Mild bilateral hip osteoarthritis. Mild anasarca. IMPRESSION: 1. Moderate left-sided hydroureteronephrosis due to an obstructive calculus at the left UVJ, measuring 9 x 8 x 9 mm. Correlation with urinalysis recommended to exclude superimposed infection. 2. Nonobstructive calculi in the right kidney, including Yulitza Shorts 6 mm renal pelvis calculus. No hydronephrosis. 3. Small amount of layering fluid in the distal  esophagus, possibly due to incomplete emptying or gastroesophageal reflux disease. 4. Bilateral pleural effusions, moderate in volume on the right and small to moderate on the left. Anasarca and small volume free fluid in the pelvis, all likely related to patient's volume status. 5. Total colonic diverticulosis. No changes of acute diverticulitis. Aortic Atherosclerosis (ICD10-I70.0). Electronically Signed: By: Rogelia Myers M.D. On: 01/14/2024 17:05   DG C-Arm 1-60 Min Result Date: 01/14/2024 CLINICAL DATA:  Cystoscopy and left ureteral stent placement performed. EXAM: DG C-ARM 1-60 MIN FINDINGS: Dose: Cumulative Air Kerma 1.4mG y Fluoro time 10s IMPRESSION: C-arm fluoro guidance provided. Electronically Signed   By: Fonda Field M.D.   On: 01/14/2024 21:42        Scheduled Meds:  aspirin  EC  81 mg Oral Daily   donepezil   10 mg Oral q AM   enoxaparin  (LOVENOX ) injection  20 mg Subcutaneous Q24H   escitalopram   20 mg Oral q AM   memantine   5 mg Oral BID   multivitamin  1 tablet Oral Daily   nicotine   21 mg Transdermal Daily   Continuous Infusions:  ampicillin  (OMNIPEN) IV 1 g (01/15/24 0958)     LOS: 2 days    Time spent: over 30 min    Meliton Monte, MD [facility]   To contact the attending provider between 7A-7P or the covering provider during after hours 7P-7A, please log into the web site [email or web address] and access using universal [place] password for that web site. If you do not have the password, please call the hospital operator.  01/15/2024, 10:47 AM

## 2024-01-15 NOTE — Evaluation (Addendum)
 Occupational Therapy Evaluation Patient Details Name: [name] MRN: [id number] DOB: 09-10-1940 Today's Date: 01/15/2024   History of Present Illness   Patient is a 83 year old female who presents 6/24 with acute encephalopathy. Pt with obstructing stone now s/p left ureteral stent placement 6/25. PMH: vascular dementia, over active bladder, TIA, hearing loss.      Clinical Impressions Pt resting comfortably in bed, no pain, in/out of sleep at beginning of session but became more alert with OOB activities. Pt oriented to self, [place], stated it was June 2005, not aware of situation. Per chart Pt's PLOF mod I, was picking up sticks in yard recently, recent increased use of rollator at home last 2 days. Lives with husband who can assist as needed. Pt currently able to ambulate around room with 1 HHA, no LOB, able to perform STS multiple times at [facility]. Pt set up/supervision for dressing/bathing, increased difficulty for LB dressing but able to complete with increased effort. Pt would benefit from continued acute OT to maximize functional activity tolerance, HHOT recommended to improve safety and functional independence in home setting.     If plan is discharge home, recommend the following:   A little help with walking and/or transfers;A little help with bathing/dressing/bathroom;Assistance with cooking/housework;Assist for transportation;Help with stairs or ramp for entrance     Functional Status Assessment   Patient has had a recent decline in their functional status and demonstrates the ability to make significant improvements in function in a reasonable and predictable amount of time.     Equipment Recommendations   None recommended by OT     Recommendations for Other Services         Precautions/Restrictions         Mobility Bed Mobility Overal bed mobility: Needs Assistance Bed Mobility: Supine to Sit     Supine to sit: Supervision     General bed  mobility comments: supervision for safety, Pt tired at beginning of session, became more alert with OOB activities    Transfers Overall transfer level: Needs assistance Equipment used: 1 person hand held assist Transfers: Sit to/from Stand, Bed to chair/wheelchair/BSC Sit to Stand: Contact guard assist     Step pivot transfers: Contact guard assist     General transfer comment: 1 person HHA transfer to recliner, ambulating 10 feet. uses rollator at home      Balance Overall balance assessment: Needs assistance Sitting-balance support: No upper extremity supported, Feet supported Sitting balance-Leahy Scale: Good     Standing balance support: Single extremity supported, During functional activity Standing balance-Leahy Scale: Fair Standing balance comment: stands 1 HHA, able to static stand unsupported but benefits from RW or handheld support                           ADL either performed or assessed with clinical judgement   ADL Overall ADL's : Needs assistance/impaired                                       General ADL Comments: set up/supervision, increased time for don/doff socks due to stiffness but able to complete, transfers with 1 HHA.     Vision         Perception         Praxis         Pertinent Vitals/Pain Pain Assessment Pain Assessment:  No/denies pain     Extremity/Trunk Assessment Upper Extremity Assessment Upper Extremity Assessment: Overall WFL for tasks assessed   Lower Extremity Assessment Lower Extremity Assessment: Generalized weakness   Cervical / Trunk Assessment Cervical / Trunk Assessment: Kyphotic   Communication Communication Communication: Impaired Factors Affecting Communication: Hearing impaired   Cognition Arousal: Alert Behavior During Therapy: WFL for tasks assessed/performed Cognition: Cognition impaired   Orientation impairments: Time, Situation         OT - Cognition Comments: in/out  of sleep at beginning, became more alert as session progressed. doesn't remember what brought her here, stated it was June 2005, oriented to [facility] and self.                 Following commands: Intact       Cueing  General Comments   Cueing Techniques: Verbal cues      Exercises     Shoulder Instructions      Home Living Family/patient expects to be discharged to:: Private residence Living Arrangements: Spouse/significant other Available Help at Discharge: Family;Available 24 hours/day Type of Home: House Home Access: Stairs to enter [employer] of Steps: 4 Entrance Stairs-Rails: Right;Left Home Layout: One level     Bathroom Shower/Tub: Tub/shower unit         Home Equipment: Grab bars - tub/shower;Educational psychologist (4 wheels)   Additional Comments: Pt lives with husband who is retired      Prior Functioning/Environment Prior Level of Function : Independent/Modified Independent             Mobility Comments: Able to walk around in yard, picking up sticks. Started using Rollator 2 days ago due to weakness/malaise. Shuffling gait in past week according to spouse ADLs Comments: Independent ADL's, spouse helps with household chores and cooking and medication management (pt prepares medications and dispenses them)    OT Problem List: Decreased strength;Decreased activity tolerance;Impaired balance (sitting and/or standing)   OT Treatment/Interventions: Self-care/ADL training;Therapeutic exercise;Energy conservation;Therapeutic activities;Patient/family education      OT Goals(Current goals can be found in the care plan section)   Acute Rehab OT Goals Patient Stated Goal: to return home OT Goal Formulation: With patient Time For Goal Achievement: 01/29/24 Potential to Achieve Goals: Good   OT Frequency:  Min 2X/week    Co-evaluation              AM-PAC OT 6 Clicks Daily Activity     Outcome Measure Help from another  person eating meals?: None Help from another person taking care of personal grooming?: A Little Help from another person toileting, which includes using toliet, bedpan, or urinal?: A Little Help from another person bathing (including washing, rinsing, drying)?: A Little Help from another person to put on and taking off regular upper body clothing?: A Little Help from another person to put on and taking off regular lower body clothing?: A Little 6 Click Score: 19   End of Session Equipment Utilized During Treatment: Gait belt Nurse Communication: Mobility status  Activity Tolerance: Patient tolerated treatment well Patient left: in chair;with call bell/phone within reach;with chair alarm set  OT Visit Diagnosis: Unsteadiness on feet (R26.81);Muscle weakness (generalized) (M62.81);Other symptoms and signs involving cognitive function                Time: 1240-1304 OT Time Calculation (min): 24 min Charges:  OT General Charges $OT Visit: 1 Visit OT Evaluation $OT Eval Low Complexity: 1 Low OT Treatments $Self Care/Home Management : 8-22 mins  [name]  [place], OTR/L   [name] 01/15/2024, 1:26 PM

## 2024-01-15 NOTE — Plan of Care (Signed)

## 2024-01-15 NOTE — Progress Notes (Addendum)
 1 Day Post-Op Subjective: First time meeting [name].  She was alert, oriented, no distress resting in bed.  No acute events overnight.  She was very sweet and pleasantly confused, stating that her husband was her memory and that I needed to speak with him.  I contacted her husband [name] by phone and he informed me of her vascular dementia and stated that she had no real memory.  She in fact did not recall having surgery last night.  I discussed the case and plan regarding outpatient follow-up in the second step for her stone management.  He expressed understanding and will follow-up with [facility].  Objective: Vital signs in last 24 hours: Temp:  [98 F (36.7 C)-98.6 F (37 C)] 98.1 F (36.7 C) (06/26 0822) Pulse Rate:  [70-84] 81 (06/26 0822) Resp:  [16-25] 18 (06/26 0822) BP: (111-126)/(52-57) 119/53 (06/26 0822) SpO2:  [86 %-100 %] 90 % (06/26 0822) FiO2 (%):  [28 %] 28 % (06/25 2134) Weight:  [45.8 kg] 45.8 kg (06/26 1052)  Assessment/Plan: 83 year old female with vascular dementia, recent enterococcal bacteremia, active enterococcal UTI. Admitted for acute encephalopathy, AKI, and obstructing 9 mm UPJ calculus.  # Left UPJ calculus # AKI # UTI-Enterococcus  Agree with ampicillin  twice daily. Definitive stone management on an outpatient basis. Trend labs, AKI has worsened slightly overnight but this is not an unexpected finding after instrumentation.  Continue rehydrating and reevaluate in the a.m. Pending morning labs, should be able to discharge once cleared medically. Foley catheter to stay in place for 24 hours to maximize drainage.  Will remove tomorrow a.m. Some hematuria.  Please hold Lovenox  if possible  Intake/Output from previous day: 06/25 0701 - 06/26 0700 In: 568.3 [I.V.:268.3; IV Piggyback:300] Out: 25 [Urine:25]  Intake/Output this shift: No intake/output data recorded.  Physical Exam:  General: Alert and oriented CV: No cyanosis Lungs:  equal chest rise Gu: Foley in place draining blood-tinged urine.  Lab Results: Recent Labs    01/13/24 0242 01/15/24 0419  HGB 10.5* 8.6*  HCT 32.2* 26.4*   BMET Recent Labs    01/13/24 0242 01/14/24 0508 01/15/24 0419  NA 135 135 134*  K 3.9 3.5 3.5  CL 98 108 106  CO2 26 20* 20*  GLUCOSE 125* 110* 98  BUN 23 16 15   CREATININE 1.35* 1.14* 1.29*  CALCIUM 9.0 7.9* 7.9*  HGB 10.5*  --  8.6*  WBC 10.8*  --  10.9*     Studies/Results: CT ABDOMEN PELVIS W CONTRAST Addendum Date: 01/15/2024 ADDENDUM REPORT: 01/15/2024 08:14 ADDENDUM: There is a voice-recognition in the body and impression of the report. The obstructive calculus on the left is at the UPJ and not at the UVJ. Electronically Signed   By: Rogelia Myers M.D.   On: 01/15/2024 08:14   Result Date: 01/15/2024 CLINICAL DATA:  nausea and vomiting EXAM: CT ABDOMEN AND PELVIS WITH CONTRAST TECHNIQUE: Multidetector CT imaging of the abdomen and pelvis was performed using the standard protocol following bolus administration of intravenous contrast. RADIATION DOSE REDUCTION: This exam was performed according to the departmental dose-optimization program which includes automated exposure control, adjustment of the mA and/or kV according to patient size and/or use of iterative reconstruction technique. CONTRAST:  60mL OMNIPAQUE  IOHEXOL  350 MG/ML SOLN COMPARISON:  October 15, 2023, September 05, 2016 FINDINGS: Lower chest: No cardiomegaly. Moderate volume right and small to moderate volume left pleural effusions with bibasilar compressive atelectasis. Hepatobiliary: Small cyst in the right hepatic lobe.Cholecystectomy. Mild dilation of  the intrahepatic and extrahepatic bile ducts, likely related to the prior cholecystectomy. The portal veins are patent. Pancreas: No mass or main ductal dilation. No peripancreatic inflammation or fluid collection. Spleen: Normal size. No mass. Adrenals/Urinary Tract: No adrenal masses. No renal mass. Delayed  perfusion of the left kidney with moderate left-sided hydroureteronephrosis due to an obstructive calculus at the left UVJ, which measures 9 x 8 x 9 mm (APxTRxCC). Moderate bilateral perinephric stranding. Nonobstructive right calyceal calculus. In the right renal pelvis, there is a nonobstructive calculus measuring 6 mm. The urinary bladder is distended without focal abnormality. Stomach/Bowel: Small amount of fluid layering in the distal esophagus. The stomach contains ingested material without focal abnormality. No small bowel wall thickening or inflammation. No small bowel obstruction.Postsurgical changes of an ileocecectomy. Descending and sigmoid colonic diverticulosis. No changes of acute diverticulitis. Vascular/Lymphatic: No aortic aneurysm. Diffuse aortoiliac atherosclerosis. No intraabdominal or pelvic lymphadenopathy. Reproductive: Age-related atrophy of the uterus and ovaries. No concerning adnexal mass. Calcified uterine fibroids.Small volume free fluid in the pelvis, which is likely related to patient's volume status. Other: No pneumoperitoneum, ascites, or mesenteric inflammation. Musculoskeletal: Osteopenia. Chronic compression fracture of L2. Age-indeterminate moderate compression fracture of T10. No retropulsion. Mild bilateral hip osteoarthritis. Mild anasarca. IMPRESSION: 1. Moderate left-sided hydroureteronephrosis due to an obstructive calculus at the left UVJ, measuring 9 x 8 x 9 mm. Correlation with urinalysis recommended to exclude superimposed infection. 2. Nonobstructive calculi in the right kidney, including a 6 mm renal pelvis calculus. No hydronephrosis. 3. Small amount of layering fluid in the distal esophagus, possibly due to incomplete emptying or gastroesophageal reflux disease. 4. Bilateral pleural effusions, moderate in volume on the right and small to moderate on the left. Anasarca and small volume free fluid in the pelvis, all likely related to patient's volume status. 5. Total  colonic diverticulosis. No changes of acute diverticulitis. Aortic Atherosclerosis (ICD10-I70.0). Electronically Signed: By: Rogelia Myers M.D. On: 01/14/2024 17:05   DG C-Arm 1-60 Min Result Date: 01/14/2024 CLINICAL DATA:  Cystoscopy and left ureteral stent placement performed. EXAM: DG C-ARM 1-60 MIN FINDINGS: Dose: Cumulative Air Kerma 1.4mG y Fluoro time 10s IMPRESSION: C-arm fluoro guidance provided. Electronically Signed   By: Fonda Field M.D.   On: 01/14/2024 21:42      LOS: 2 days   Ole Bourdon, NP [facility] Pager: [phone]  01/15/2024, 12:14 PM

## 2024-01-16 ENCOUNTER — Inpatient Hospital Stay (HOSPITAL_COMMUNITY)

## 2024-01-16 ENCOUNTER — Other Ambulatory Visit (HOSPITAL_COMMUNITY): Payer: Self-pay

## 2024-01-16 DIAGNOSIS — N390 Urinary tract infection, site not specified: Secondary | ICD-10-CM | POA: Diagnosis not present

## 2024-01-16 DIAGNOSIS — A4150 Gram-negative sepsis, unspecified: Secondary | ICD-10-CM | POA: Diagnosis not present

## 2024-01-16 LAB — CBC WITH DIFFERENTIAL/PLATELET
Abs Immature Granulocytes: 0.07 10*3/uL (ref 0.00–0.07)
Basophils Absolute: 0.1 10*3/uL (ref 0.0–0.1)
Basophils Relative: 1 %
Eosinophils Absolute: 0.3 10*3/uL (ref 0.0–0.5)
Eosinophils Relative: 3 %
HCT: 25.9 % — ABNORMAL LOW (ref 36.0–46.0)
Hemoglobin: 8.7 g/dL — ABNORMAL LOW (ref 12.0–15.0)
Immature Granulocytes: 1 %
Lymphocytes Relative: 12 %
Lymphs Abs: 1.2 10*3/uL (ref 0.7–4.0)
MCH: 31 pg (ref 26.0–34.0)
MCHC: 33.6 g/dL (ref 30.0–36.0)
MCV: 92.2 fL (ref 80.0–100.0)
Monocytes Absolute: 1 10*3/uL (ref 0.1–1.0)
Monocytes Relative: 10 %
Neutro Abs: 7.3 10*3/uL (ref 1.7–7.7)
Neutrophils Relative %: 73 %
Platelets: 285 10*3/uL (ref 150–400)
RBC: 2.81 MIL/uL — ABNORMAL LOW (ref 3.87–5.11)
RDW: 14.1 % (ref 11.5–15.5)
WBC: 9.9 10*3/uL (ref 4.0–10.5)
nRBC: 0 % (ref 0.0–0.2)

## 2024-01-16 LAB — COMPREHENSIVE METABOLIC PANEL WITH GFR
ALT: 11 U/L (ref 0–44)
AST: 15 U/L (ref 15–41)
Albumin: 2.2 g/dL — ABNORMAL LOW (ref 3.5–5.0)
Alkaline Phosphatase: 39 U/L (ref 38–126)
Anion gap: 10 (ref 5–15)
BUN: 13 mg/dL (ref 8–23)
CO2: 21 mmol/L — ABNORMAL LOW (ref 22–32)
Calcium: 8.2 mg/dL — ABNORMAL LOW (ref 8.9–10.3)
Chloride: 106 mmol/L (ref 98–111)
Creatinine, Ser: 1.02 mg/dL — ABNORMAL HIGH (ref 0.44–1.00)
GFR, Estimated: 54 mL/min — ABNORMAL LOW (ref >60)
Glucose, Bld: 111 mg/dL — ABNORMAL HIGH (ref 70–99)
Potassium: 3.5 mmol/L (ref 3.5–5.1)
Sodium: 137 mmol/L (ref 135–145)
Total Bilirubin: <0.2 mg/dL (ref 0.0–1.2)
Total Protein: 5.4 g/dL — ABNORMAL LOW (ref 6.5–8.1)

## 2024-01-16 LAB — GASTROINTESTINAL PANEL BY PCR, STOOL (REPLACES STOOL CULTURE)

## 2024-01-16 LAB — MAGNESIUM: Magnesium: 1.9 mg/dL (ref 1.7–2.4)

## 2024-01-16 LAB — CALPROTECTIN, FECAL: Calprotectin, Fecal: 13 ug/g (ref 0–120)

## 2024-01-16 LAB — PHOSPHORUS: Phosphorus: 2.5 mg/dL (ref 2.5–4.6)

## 2024-01-16 LAB — PANCREATIC ELASTASE, FECAL: Pancreatic Elastase-1, Stool: >800 ug Elast./g (ref >200)

## 2024-01-16 MED ORDER — FUROSEMIDE 20 MG PO TABS
20.0000 mg | ORAL_TABLET | Freq: Every day | ORAL | 0 refills | Status: DC
  Filled 2024-01-16: qty 7, 7d supply, fill #0

## 2024-01-16 MED ORDER — MELATONIN 5 MG PO TABS
5.0000 mg | ORAL_TABLET | Freq: Every evening | ORAL | Status: DC | PRN
  Administered 2024-01-16: 5 mg via ORAL
  Filled 2024-01-16: qty 1

## 2024-01-16 MED ORDER — AMOXICILLIN 500 MG PO CAPS
500.0000 mg | ORAL_CAPSULE | Freq: Two times a day (BID) | ORAL | 0 refills | Status: AC
  Filled 2024-01-16: qty 20, 10d supply, fill #0

## 2024-01-16 MED ORDER — PANTOPRAZOLE SODIUM 40 MG PO TBEC
40.0000 mg | DELAYED_RELEASE_TABLET | Freq: Every day | ORAL | 0 refills | Status: AC
  Filled 2024-01-16: qty 30, 30d supply, fill #0

## 2024-01-16 MED ORDER — POTASSIUM CHLORIDE CRYS ER 20 MEQ PO TBCR
20.0000 meq | EXTENDED_RELEASE_TABLET | Freq: Every day | ORAL | 0 refills | Status: DC
  Filled 2024-01-16: qty 7, 7d supply, fill #0

## 2024-01-16 MED ORDER — FUROSEMIDE 10 MG/ML IJ SOLN
20.0000 mg | Freq: Once | INTRAMUSCULAR | Status: AC
  Administered 2024-01-16: 20 mg via INTRAVENOUS
  Filled 2024-01-16: qty 4

## 2024-01-16 NOTE — Progress Notes (Signed)
 PT Cancellation Note  Patient Details Name: [name] MRN: [id number] DOB: [birth year]   Cancelled Treatment:    Reason Eval/Treat Not Completed: Patient declined, no reason specified (secondary to fatigue)  Aleck Daring, PT, DPT Acute Rehabilitation Services Office [phone]    [name] 01/16/2024, 10:57 AM

## 2024-01-16 NOTE — TOC Transition Note (Signed)
 Transition of Care [facility]) - Discharge Note   Patient Details  Name: [name] MRN: [id number] Date of Birth: 1941/04/01  [facility]) CM/SW Contact:  Roxie KANDICE Stain, RN Phone Number: 01/16/2024, 4:47 PM   Clinical Narrative:    Patient stable for discharge. Lynette with [facility] notified.  Husband will transport home.    Final next level of care: Home w Home Health Services Barriers to Discharge: Barriers Resolved   Patient Goals and CMS Choice Patient states their goals for this hospitalization and ongoing recovery are:: Return home CMS Medicare.gov Compare Post Acute Care list provided to:: Patient Represenative (must comment) Choice offered to / list presented to : Spouse      Discharge Placement               Home        Discharge Plan and Services Additional resources added to the After Visit Summary for     Discharge Planning Services: CM Consult Post Acute Care Choice: Home Health                    HH Arranged: PT, OT [facility] Agency: [facility] Date [facility] Agency Contacted: 01/16/24 Time HH Agency Contacted: [phone] Representative spoke with at [facility] Agency: Arna  Social Drivers of Health (SDOH) Interventions SDOH Screenings   Food Insecurity: No Food Insecurity (01/14/2024)  Housing: Low Risk  (01/14/2024)  Transportation Needs: No Transportation Needs (01/14/2024)  Utilities: Not At Risk (01/14/2024)  Social Connections: Moderately Isolated (01/14/2024)  Tobacco Use: High Risk (01/13/2024)     Readmission Risk Interventions    10/20/2023   10:53 AM 10/16/2023    1:47 PM  Readmission Risk Prevention Plan  Post Dischage Appt Complete   Medication Screening Complete   Transportation Screening Complete Complete  PCP or Specialist Appt within 5-7 Days  Complete  Home Care Screening  Complete  Medication Review (RN CM)  Complete

## 2024-01-16 NOTE — TOC Initial Note (Signed)
 Transition of Care [facility]) - Initial/Assessment Note    Patient Details  Name: [name] MRN: [id number] Date of Birth: April 11, 1941  Transition [facility]) CM/SW Contact:    Roxie KANDICE Stain, RN Phone Number: 01/16/2024, 1:58 PM  Clinical Narrative:                 Spoke to patient's husband, Lynwood, regarding transition needs.  Patient lives with spouse and has walker. Patient has used [facility] in the past and would like to use them again.   Lynette with [facility] can accept referral.  Spouse can transport home.  Address, Phone number and PCP verified.  Expected Discharge Plan: Home w Home Health Services Barriers to Discharge: Continued Medical Work up   Patient Goals and CMS Choice Patient states their goals for this hospitalization and ongoing recovery are:: Return home CMS Medicare.gov Compare Post Acute Care list provided to:: Patient Represenative (must comment) Choice offered to / list presented to : Spouse      Expected Discharge Plan and Services   Discharge Planning Services: CM Consult Post Acute Care Choice: Home Health Living arrangements for the past 2 months: Single Family Home                           HH Arranged: PT, OT HH Agency: [facility] Date [facility] Agency Contacted: 01/16/24 Time HH Agency Contacted: 1358 Representative spoke with at [facility] Agency: Arna  Prior Living Arrangements/Services Living arrangements for the past 2 months: Single Family Home Lives with:: Spouse Patient language and need for interpreter reviewed:: Yes Do you feel safe going back to the place where you live?: Yes      Need for Family Participation in Patient Care: Yes (Comment) Care giver support system in place?: Yes (comment) Current home services: DME (walker, shower chair) Criminal Activity/Legal Involvement Pertinent to Current Situation/Hospitalization: No - Comment as needed  Activities of Daily Living   ADL Screening (condition at time of  admission) Independently performs ADLs?: Yes (appropriate for developmental age) Is the patient deaf or have difficulty hearing?: Yes Does the patient have difficulty seeing, even when wearing glasses/contacts?: Yes Does the patient have difficulty concentrating, remembering, or making decisions?: Yes  Permission Sought/Granted Permission sought to share information with : Photographer granted to share info w AGENCY: HH        Emotional Assessment Appearance:: Appears stated age       Alcohol  / Substance Use: Not Applicable    Admission diagnosis:  Sepsis due to gram-negative urinary tract infection (HCC) [A41.50, N39.0] Sepsis, due to unspecified organism, unspecified whether acute organ dysfunction present [facility]) [A41.9] Patient Active Problem List   Diagnosis Date Noted   Sepsis due to gram-negative urinary tract infection (HCC) 01/13/2024   Bacteremia 10/17/2023   UTI (urinary tract infection) 10/14/2023   Cerebrovascular disease 02/04/2023   Moderate vascular dementia without behavioral disturbance, psychotic disturbance, mood disturbance, or anxiety (HCC) 02/04/2023   Memory loss 12/30/2022   Unsteady gait 12/30/2022   Atopic dermatitis 02/12/2021   Gastro-esophageal reflux disease without esophagitis 02/12/2021   Gingivitis 02/12/2021   Impairment of balance 02/12/2021   Irritable bowel syndrome with diarrhea 02/12/2021   Major depressive disorder 02/12/2021   Plantar wart of right foot 02/12/2021   Urinary hesitancy 02/12/2021   Chondrodermatitis nodularis helicis of left ear 12/06/2020   Allergic urticaria 01/25/2020   Perennial allergic rhinitis 01/25/2020  Perennial allergic rhinitis 01/25/2020   Hematuria 02/22/2019   Degenerative disc disease, lumbar 04/17/2018   Degenerative scoliosis in adult patient 04/17/2018   SI joint arthritis (HCC) 04/17/2018   Degenerative disc disease, cervical 09/09/2017   Spondylolisthesis  of cervical region 09/09/2017   Chronic swimmer's ear of both sides 06/03/2017   Tobacco use 05/06/2017   COPD (chronic obstructive pulmonary disease) (HCC) 05/06/2017   History of renal calculi 01/14/2017   Anxiety 09/05/2016   Renal stone 09/05/2016   Hoarseness 06/20/2016   Laryngopharyngeal reflux (LPR) 06/20/2016   PCP:  Patti Mliss LABOR, FNP Pharmacy:   [facility] - [place], [place] - [address] [place] [address] Phone: [phone] Fax: [phone]  [facility] [address] [place] [address] Phone: [phone] Fax: [phone]     Social Drivers of Health (SDOH) Social History: SDOH Screenings   Food Insecurity: No Food Insecurity (01/14/2024)  Housing: Low Risk  (01/14/2024)  Transportation Needs: No Transportation Needs (01/14/2024)  Utilities: Not At Risk (01/14/2024)  Social Connections: Moderately Isolated (01/14/2024)  Tobacco Use: High Risk (01/13/2024)   SDOH Interventions:     Readmission Risk Interventions    10/20/2023   10:53 AM 10/16/2023    1:47 PM  Readmission Risk Prevention Plan  Post Dischage Appt Complete   Medication Screening Complete   Transportation Screening Complete Complete  PCP or Specialist Appt within 5-7 Days  Complete  Home Care Screening  Complete  Medication Review (RN CM)  Complete

## 2024-01-16 NOTE — Progress Notes (Signed)
 Incontinent episode of urine in pull-up from home after foley removal. Dr. Perri made aware, husband at bedside to transport patient home. Discharge instructions reviewed with husband and patient, verbalized understanding.

## 2024-01-16 NOTE — Discharge Summary (Signed)
 Physician Discharge Summary  KHLOEY CHERN FMW:[id number] DOB: [birth year] DOA: 01/13/2024  PCP: Patti Mliss LABOR, FNP  Admit date: 01/13/2024 Discharge date: 01/16/2024  Time spent: 40 minutes  Recommendations for Outpatient Follow-up:  Follow outpatient CBC/CMP  Needs urology follow up for definitive stone management Follow repeat CXR to follow effusion  Follow volume status on lasix, consider whether to continue long term or adjust dose Repeat UA sometime after resolution of acute processes, follow proteinuria  Layering fluid in distal esophagus, follow outpatient, consider GERD - PPI started Follow labs requested by outpatient GI for chronic diarrhea  Discharge Diagnoses:  Principal Problem:   Sepsis due to gram-negative urinary tract infection (HCC)   Discharge Condition: stable  Diet recommendation: heart healthy  Filed Weights   01/13/24 0210 01/15/24 1052 01/16/24 0610  Weight: 44.5 kg 45.8 kg 45 kg    History of present illness:   MAKESHIA SEAT is Jefte Carithers 83 y.o. female with medical history significant of vascular dementia, overactive bladder with incontinence, history of TIA, hearing loss, and recent admission in 09/2023 for AMS iso Enterococcus bacteremia/UTI who is admitted acute encephalopathy.    She's been admitted with sepsis due to Thorn Demas UTI.  CT showed hydronephrosis with obstructive calculus at L UVJ.  Now s/p L ureteral stent placement.  Improved clinically, discharging with plans for definitive stone management with urology outpatient.    See below for additional details  Hospital Course:  Assessment and Plan:  Sepsis due to Urinary Tract Infection  UTI with obstructing stone Hydronephrosis  CT with moderate L sided hydro with obstructive calculus at the L UVJ Presented with fever, tachypnea.  Ruled in for sepsis.  Urine culture with enterococcus. Sensitive to ampicillin  .  Now s/p L ureteral stent placement.  Discharge on amoxicillin  adjusted for renal  function.  Needs definitive stone management with urology outpatient.  Foley d/c'd today.   Blood cultures NGx2  Nausea  Vomiting Suspect related to UTI above Resolved    Volume overload CT with bilateral effusions, anasarca, free fluid in pelvis.   CXR 6/27 with attenuation of R base, suspect this represents effusion - needs outpatient follow up BNP 774.  UA has 100 mg/dl protein - would reevaluate after UTI treated.  LFT's wnl.   Echo in march with normal EF.  Repeat echo with preserved EF. Given dose of IV lasix here today.  Will discharge with 7 days of lasix + kcl.  Will need outpatient evaulation by PCP to determine if this is something she'll need long term.  Needs repeat imaging outpatient.   Strict I/O, daily weights   AKI Fluctuating, will monitor for now   Vascular Dementia Anxiety Lexapro , donepezil , memantine , xanax  Delirium precautions    Layering Fluid in Distal Esophagus Incomplete emptying vs GERD Follow outpatient  PPI   Diarrhea Chronic diarrhea, outpatient GI ordered C diff, GI path panel, fecal calprotectin, and pancreatic elastase.  Will obtain GI path panel (negative), fecal calprotectin, pancreatic elastase while here.  Will hold off on C diff unless symptoms c/w this.  Follow with outpatient GI    Procedures: 6/25 Procedure:   Cystoscopy left ureteral stent placement   6/26 echo IMPRESSIONS     1. Left ventricular ejection fraction, by estimation, is 65 to 70%. The  left ventricle has normal function. The average left ventricular global  longitudinal strain is -20.4 %. The global longitudinal strain is normal.   2. Right ventricular systolic function is normal. The right ventricular  size  is moderately enlarged. There is moderately elevated pulmonary artery  systolic pressure. The estimated right ventricular systolic pressure is  50.8 mmHg.   3. Left atrial size was mildly dilated.   4. Right atrial size was moderately dilated.   5. The  mitral valve is degenerative. Trivial mitral valve regurgitation.   6. Tricuspid valve regurgitation is moderate.   7. The aortic valve is tricuspid. Aortic valve regurgitation is not  visualized. No aortic stenosis is present.   8. The inferior vena cava is dilated in size with >50% respiratory  variability, suggesting right atrial pressure of 8 mmHg.  Consultations: urology  Discharge Exam: Vitals:   01/16/24 0419 01/16/24 0900  BP: 122/60 (!) 121/57  Pulse: 76 71  Resp: 16 17  Temp: 98.2 F (36.8 C) 98.2 F (36.8 C)  SpO2: 92% 95%   No complaints Discussed d/c plan with husband  General: No acute distress. Cardiovascular: Heart sounds show Chevi Lim regular rate, and rhythm. No gallops or rubs. No murmurs. No JVD. Lungs: Clear to auscultation bilaterally with good air movement. No rales, rhonchi or wheezes. Abdomen: Soft, nontender, nondistended with normal active bowel sounds. No masses. No hepatosplenomegaly. Neurological: Alert and oriented 3. Moves all extremities 4 with equal strength. Cranial nerves II through XII grossly intact. Skin: Warm and dry. No rashes or lesions. Extremities: No clubbing or cyanosis. No edema. Dependent edema to hip.    Discharge Instructions   Discharge Instructions     Call MD for:  difficulty breathing, headache or visual disturbances   Complete by: As directed    Call MD for:  extreme fatigue   Complete by: As directed    Call MD for:  hives   Complete by: As directed    Call MD for:  persistant dizziness or light-headedness   Complete by: As directed    Call MD for:  persistant nausea and vomiting   Complete by: As directed    Call MD for:  redness, tenderness, or signs of infection (pain, swelling, redness, odor or green/yellow discharge around incision site)   Complete by: As directed    Call MD for:  severe uncontrolled pain   Complete by: As directed    Call MD for:  temperature >100.4   Complete by: As directed    Diet - low  sodium heart healthy   Complete by: As directed    Discharge instructions   Complete by: As directed    You were seen for sepsis ( Henery Betzold severe infection) which was Jakson Delpilar result of Noemy Hallmon urinary tract infection with an obstructing stone.  You've now had Kyree Adriano stent placed to relieve the obstruction.    We'll continue antibiotics outpatient.  You'll need to follow up with urology for definitive stone management as an outpatient.  Your CT scan had evidence of extra fluid and you have some swelling in your hips.  You should have repeat imaging outpatient to follow the extra fluid on you.  You may need some fluid pills as an outpatient, I'll send you home with 7 days of lasix.  You should follow up with your PCP outpatient to determine if this should be continued long term.  If you have worsening swelling or shortness of breath, return to the hospital.    You had imaging findings showing fluid in your distal esophagus.  I'll start you on protonix for reflux.  Follow this with your outpatient doctor.    Return for new, recurrent, or worsening symptoms.  Please ask  your PCP to request records from this hospitalization so they know what was done and what the next steps will be.   Increase activity slowly   Complete by: As directed    No wound care   Complete by: As directed       Allergies as of 01/16/2024       Reactions   Sulfa Antibiotics Itching, Rash        Medication List     TAKE these medications    ALPRAZolam  0.25 MG tablet Commonly known as: XANAX  Take 1 tablet (0.25 mg total) by mouth daily as needed for anxiety. What changed: when to take this   amoxicillin  500 MG capsule Commonly known as: AMOXIL  Take 1 capsule (500 mg total) by mouth 2 (two) times daily for 10 days.   aspirin  EC 81 MG tablet Take 81 mg by mouth daily.   Blink Tears 0.25 % Soln Generic drug: Polyethylene Glycol 400 Place 1 drop into both eyes in the morning.   Calcium-Vitamin D-Vitamin K 650-12.5-40  MG-MCG-MCG Chew Chew 1 tablet by mouth daily.   cyanocobalamin  1000 MCG tablet Commonly known as: VITAMIN B12 Take 1,000 mcg by mouth daily.   donepezil  10 MG tablet Commonly known as: ARICEPT  Take 10 mg by mouth in the morning.   fluticasone  50 MCG/ACT nasal spray Commonly known as: Flonase  1-2 sprays per nostril daily if needed   furosemide 20 MG tablet Commonly known as: Lasix Take 1 tablet (20 mg total) by mouth daily for 7 days. Follow up with your outpatient PCP your volume status and repeat labs within Jarek Longton week.  They'll decide whether this is Jarell Mcewen medicine that needs to be continued long term.   Ibuprofen 200 MG Caps Take 200 mg by mouth every 6 (six) hours as needed (for pain or headaches).   Lexapro  20 MG tablet Generic drug: escitalopram  Take 20 mg by mouth in the morning.   memantine  5 MG tablet Commonly known as: NAMENDA  Take 5 mg by mouth in the morning and at bedtime.   mirabegron  ER 50 MG Tb24 tablet Commonly known as: MYRBETRIQ  Take 50 mg by mouth in the morning.   nicotine  21 mg/24hr patch Commonly known as: NICODERM CQ  - dosed in mg/24 hours Place 1 patch (21 mg total) onto the skin daily.   potassium chloride 10 MEQ tablet Commonly known as: Klor-Con 10 Take 2 tablets (20 mEq total) by mouth daily for 7 days.   PreserVision AREDS 2 Caps Take 1 capsule by mouth 2 (two) times daily.   ONE Azriella Mattia DAY WOMEN 50 PLUS PO Take 1 tablet by mouth daily after breakfast.   PROBIOTIC PO Take 1 capsule by mouth daily.   TYLENOL  500 MG tablet Generic drug: acetaminophen  Take 500 mg by mouth every 6 (six) hours as needed for mild pain (pain score 1-3) or headache.       Allergies  Allergen Reactions   Sulfa Antibiotics Itching and Rash    Follow-up Information     [facility] Follow up.   Specialty: Home Health Services Why: Home health has been arranged. They will contact yo to schedule apt within 48 hrs post discharge. Contact  information: [address] [place] [address] [phone]                  The results of significant diagnostics from this hospitalization (including imaging, microbiology, ancillary and laboratory) are listed below for reference.  Significant Diagnostic Studies: DG CHEST PORT 1 VIEW Result Date: 01/16/2024 CLINICAL DATA:  142230 Pleural effusion 142230 EXAM: PORTABLE CHEST - 1 VIEW COMPARISON:  January 13, 2024 FINDINGS: Hazy attenuation of the right lung base. No pneumothorax. No cardiomegaly. Aortic atherosclerosis. no acute fracture or destructive lesions. Multilevel thoracic osteophytosis. IMPRESSION: Hazy attenuation of the right lung base, which could represent developing airspace disease, such as pneumonia, or layering pleural effusion. Electronically Signed   By: Rogelia Myers M.D.   On: 01/16/2024 14:09   ECHOCARDIOGRAM LIMITED Result Date: 01/15/2024    ECHOCARDIOGRAM LIMITED REPORT   Patient Name:   ALLINA RICHES Date of Exam: 01/15/2024 Medical Rec #:  [id number]       Height:       59.0 in Accession #:    [id number]      Weight:       98.1 lb Date of Birth:  12/18/1940       BSA:          1.363 m Patient Age:    83 years        BP:           124/57 mmHg Patient Gender: F               HR:           76 bpm. Exam Location:  Inpatient Procedure: 2D Echo, 3D Echo, Limited Echo, Cardiac Doppler, Color Doppler and            Strain Analysis (Both Spectral and Color Flow Doppler were utilized            during procedure). Indications:    LV function  History:        Patient has prior history of Echocardiogram examinations, most                 recent 10/17/2023.  Sonographer:    Therisa Crouch Referring Phys: [phone] Jovante Hammitt CALDWELL POWELL JR IMPRESSIONS  1. Left ventricular ejection fraction, by estimation, is 65 to 70%. The left ventricle has normal function. The average left ventricular global longitudinal strain is -20.4 %. The global longitudinal strain is normal.  2. Right  ventricular systolic function is normal. The right ventricular size is moderately enlarged. There is moderately elevated pulmonary artery systolic pressure. The estimated right ventricular systolic pressure is 50.8 mmHg.  3. Left atrial size was mildly dilated.  4. Right atrial size was moderately dilated.  5. The mitral valve is degenerative. Trivial mitral valve regurgitation.  6. Tricuspid valve regurgitation is moderate.  7. The aortic valve is tricuspid. Aortic valve regurgitation is not visualized. No aortic stenosis is present.  8. The inferior vena cava is dilated in size with >50% respiratory variability, suggesting right atrial pressure of 8 mmHg. FINDINGS  Left Ventricle: Left ventricular ejection fraction, by estimation, is 65 to 70%. The left ventricle has normal function. The average left ventricular global longitudinal strain is -20.4 %. Strain was performed and the global longitudinal strain is normal. The left ventricular internal cavity size was small. Right Ventricle: The right ventricular size is moderately enlarged. Right ventricular systolic function is normal. There is moderately elevated pulmonary artery systolic pressure. The tricuspid regurgitant velocity is 3.27 m/s, and with an assumed right atrial pressure of 8 mmHg, the estimated right ventricular systolic pressure is 50.8 mmHg. Left Atrium: Left atrial size was mildly dilated. Right Atrium: Right atrial size was moderately dilated. Pericardium: There is no evidence of pericardial effusion.  Mitral Valve: The mitral valve is degenerative in appearance. Trivial mitral valve regurgitation. Tricuspid Valve: Tricuspid valve regurgitation is moderate. Aortic Valve: The aortic valve is tricuspid. Aortic valve regurgitation is not visualized. No aortic stenosis is present. Pulmonic Valve: The pulmonic valve was normal in structure. Pulmonic valve regurgitation is trivial. Aorta: The aortic root and ascending aorta are structurally normal, with  no evidence of dilitation. Venous: The inferior vena cava is dilated in size with greater than 50% respiratory variability, suggesting right atrial pressure of 8 mmHg. LEFT VENTRICLE PLAX 2D LVIDd:         4.60 cm LVIDs:         2.80 cm 2D Longitudinal Strain LV PW:         0.70 cm 2D Strain GLS Avg:     -20.4 % LV IVS:        0.80 cm                        3D Volume EF                        LV 3D EDV:   51.48 ml                        LV 3D ESV:   20.06 ml RIGHT VENTRICLE             IVC RV Basal diam:  3.50 cm     IVC diam: 2.20 cm RV Mid diam:    3.40 cm RV S prime:     13.10 cm/s TAPSE (M-mode): 2.4 cm LEFT ATRIUM         Index       RIGHT ATRIUM           Index LA diam:    2.20 cm 1.61 cm/m  RA Area:     14.90 cm                                 RA Volume:   35.60 ml  26.13 ml/m   AORTA Ao Root diam: 2.70 cm TRICUSPID VALVE TR Peak grad:   42.8 mmHg TR Vmax:        327.00 cm/s Toribio Fuel MD Electronically signed by Toribio Fuel MD Signature Date/Time: 01/15/2024/2:09:33 PM    Final    CT ABDOMEN PELVIS W CONTRAST Addendum Date: 01/15/2024 ADDENDUM REPORT: 01/15/2024 08:14 ADDENDUM: There is Hamza Empson voice-recognition in the body and impression of the report. The obstructive calculus on the left is at the UPJ and not at the UVJ. Electronically Signed   By: Rogelia Myers M.D.   On: 01/15/2024 08:14   Result Date: 01/15/2024 CLINICAL DATA:  nausea and vomiting EXAM: CT ABDOMEN AND PELVIS WITH CONTRAST TECHNIQUE: Multidetector CT imaging of the abdomen and pelvis was performed using the standard protocol following bolus administration of intravenous contrast. RADIATION DOSE REDUCTION: This exam was performed according to the departmental dose-optimization program which includes automated exposure control, adjustment of the mA and/or kV according to patient size and/or use of iterative reconstruction technique. CONTRAST:  60mL OMNIPAQUE  IOHEXOL  350 MG/ML SOLN COMPARISON:  October 15, 2023, September 05, 2016 FINDINGS: Lower chest: No cardiomegaly. Moderate volume right and small to moderate volume left pleural effusions with bibasilar compressive atelectasis. Hepatobiliary: Small cyst in the right hepatic lobe.Cholecystectomy. Mild dilation of  the intrahepatic and extrahepatic bile ducts, likely related to the prior cholecystectomy. The portal veins are patent. Pancreas: No mass or main ductal dilation. No peripancreatic inflammation or fluid collection. Spleen: Normal size. No mass. Adrenals/Urinary Tract: No adrenal masses. No renal mass. Delayed perfusion of the left kidney with moderate left-sided hydroureteronephrosis due to an obstructive calculus at the left UVJ, which measures 9 x 8 x 9 mm (APxTRxCC). Moderate bilateral perinephric stranding. Nonobstructive right calyceal calculus. In the right renal pelvis, there is Man Bonneau nonobstructive calculus measuring 6 mm. The urinary bladder is distended without focal abnormality. Stomach/Bowel: Small amount of fluid layering in the distal esophagus. The stomach contains ingested material without focal abnormality. No small bowel wall thickening or inflammation. No small bowel obstruction.Postsurgical changes of an ileocecectomy. Descending and sigmoid colonic diverticulosis. No changes of acute diverticulitis. Vascular/Lymphatic: No aortic aneurysm. Diffuse aortoiliac atherosclerosis. No intraabdominal or pelvic lymphadenopathy. Reproductive: Age-related atrophy of the uterus and ovaries. No concerning adnexal mass. Calcified uterine fibroids.Small volume free fluid in the pelvis, which is likely related to patient's volume status. Other: No pneumoperitoneum, ascites, or mesenteric inflammation. Musculoskeletal: Osteopenia. Chronic compression fracture of L2. Age-indeterminate moderate compression fracture of T10. No retropulsion. Mild bilateral hip osteoarthritis. Mild anasarca. IMPRESSION: 1. Moderate left-sided hydroureteronephrosis due to an obstructive calculus at  the left UVJ, measuring 9 x 8 x 9 mm. Correlation with urinalysis recommended to exclude superimposed infection. 2. Nonobstructive calculi in the right kidney, including Demontre Padin 6 mm renal pelvis calculus. No hydronephrosis. 3. Small amount of layering fluid in the distal esophagus, possibly due to incomplete emptying or gastroesophageal reflux disease. 4. Bilateral pleural effusions, moderate in volume on the right and small to moderate on the left. Anasarca and small volume free fluid in the pelvis, all likely related to patient's volume status. 5. Total colonic diverticulosis. No changes of acute diverticulitis. Aortic Atherosclerosis (ICD10-I70.0). Electronically Signed: By: Rogelia Myers M.D. On: 01/14/2024 17:05   DG C-Arm 1-60 Min Result Date: 01/14/2024 CLINICAL DATA:  Cystoscopy and left ureteral stent placement performed. EXAM: DG C-ARM 1-60 MIN FINDINGS: Dose: Cumulative Air Kerma 1.4mG y Fluoro time 10s IMPRESSION: C-arm fluoro guidance provided. Electronically Signed   By: Fonda Field M.D.   On: 01/14/2024 21:42   DG Chest Port 1 View Result Date: 01/13/2024 CLINICAL DATA:  Sepsis EXAM: PORTABLE CHEST 1 VIEW COMPARISON:  10/14/2023 FINDINGS: The heart size and mediastinal contours are within normal limits. Both lungs are clear. The visualized skeletal structures are unremarkable. IMPRESSION: No active disease. Electronically Signed   By: Dorethia Molt M.D.   On: 01/13/2024 02:49    Microbiology: Recent Results (from the past 240 hours)  Resp panel by RT-PCR (RSV, Flu Mackinley Kiehn&B, Covid) Anterior Nasal Swab     Status: None   Collection Time: 01/13/24  2:20 AM   Specimen: Anterior Nasal Swab  Result Value Ref Range Status   SARS Coronavirus 2 by RT PCR NEGATIVE NEGATIVE Final   Influenza Birgitta Uhlir by PCR NEGATIVE NEGATIVE Final   Influenza B by PCR NEGATIVE NEGATIVE Final    Comment: (NOTE) The Xpert Xpress SARS-CoV-2/FLU/RSV plus assay is intended as an aid in the diagnosis of influenza from  Nasopharyngeal swab specimens and should not be used as Riki Berninger sole basis for treatment. Nasal washings and aspirates are unacceptable for Xpert Xpress SARS-CoV-2/FLU/RSV testing.  Fact Sheet for Patients: [name]  Fact Sheet for Healthcare Providers: [name]  This test is not yet approved or cleared by the [place]  FDA and has  been authorized for detection and/or diagnosis of SARS-CoV-2 by FDA under an Emergency Use Authorization (EUA). This EUA will remain in effect (meaning this test can be used) for the duration of the COVID-19 declaration under Section 564(b)(1) of the Act, 21 U.S.C. section 360bbb-3(b)(1), unless the authorization is terminated or revoked.     Resp Syncytial Virus by PCR NEGATIVE NEGATIVE Final    Comment: (NOTE) Fact Sheet for Patients: [name]  Fact Sheet for Healthcare Providers: [name]  This test is not yet approved or cleared by the [place]  FDA and has been authorized for detection and/or diagnosis of SARS-CoV-2 by FDA under an Emergency Use Authorization (EUA). This EUA will remain in effect (meaning this test can be used) for the duration of the COVID-19 declaration under Section 564(b)(1) of the Act, 21 U.S.C. section 360bbb-3(b)(1), unless the authorization is terminated or revoked.  Performed at [facility], [address], [place], [place] [address]   Blood Culture (routine x 2)     Status: None (Preliminary result)   Collection Time: 01/13/24  2:42 AM   Specimen: BLOOD  Result Value Ref Range Status   Specimen Description BLOOD LEFT ANTECUBITAL  Final   Special Requests   Final    BOTTLES DRAWN AEROBIC AND ANAEROBIC Blood Culture results may not be optimal due to an inadequate volume of blood received in culture bottles   Culture   Final    NO GROWTH 3 DAYS Performed at [facility], [address], [place], [place] [address]    Report Status PENDING  Incomplete  Blood Culture (routine x 2)     Status: None (Preliminary result)   Collection Time: 01/13/24  2:42 AM   Specimen: BLOOD LEFT FOREARM  Result Value Ref Range Status   Specimen Description BLOOD LEFT FOREARM  Final   Special Requests   Final    BOTTLES DRAWN AEROBIC AND ANAEROBIC Blood Culture results may not be optimal due to an inadequate volume of blood received in culture bottles   Culture   Final    NO GROWTH 3 DAYS Performed at [facility], [address] [place], [place] [address]    Report Status PENDING  Incomplete  Urine Culture     Status: Abnormal   Collection Time: 01/13/24  4:00 AM   Specimen: Urine, Random  Result Value Ref Range Status   Specimen Description URINE, RANDOM  Final   Special Requests   Final    NONE Reflexed from [id number] Performed at [facility], [address], [place], [place] [address]    Culture >=100,000 COLONIES/mL ENTEROCOCCUS FAECALIS (Xavyer Steenson)  Final   Report Status 01/15/2024 FINAL  Final   Organism ID, Bacteria ENTEROCOCCUS FAECALIS (Taseen Marasigan)  Final      Susceptibility   Enterococcus faecalis - MIC*    AMPICILLIN  <=2 SENSITIVE Sensitive     NITROFURANTOIN <=16 SENSITIVE Sensitive     VANCOMYCIN 1 SENSITIVE Sensitive     * >=100,000 COLONIES/mL ENTEROCOCCUS FAECALIS  Gastrointestinal Panel by PCR , Stool     Status: None   Collection Time: 01/15/24 10:55 AM   Specimen: Stool  Result Value Ref Range Status   Campylobacter species NOT DETECTED NOT DETECTED Final   Plesimonas shigelloides NOT DETECTED NOT DETECTED Final   Salmonella species NOT DETECTED NOT DETECTED Final   Yersinia enterocolitica NOT DETECTED NOT DETECTED Final   Vibrio species NOT DETECTED NOT DETECTED Final   Vibrio cholerae NOT DETECTED  NOT DETECTED Final   Enteroaggregative E coli (EAEC) NOT DETECTED NOT DETECTED Final   Enteropathogenic E coli (EPEC) NOT DETECTED NOT  DETECTED Final   Enterotoxigenic E coli (ETEC) NOT DETECTED NOT DETECTED Final   Shiga like toxin producing E coli (STEC) NOT DETECTED NOT DETECTED Final   Shigella/Enteroinvasive E coli (EIEC) NOT DETECTED NOT DETECTED Final   Cryptosporidium NOT DETECTED NOT DETECTED Final   Cyclospora cayetanensis NOT DETECTED NOT DETECTED Final   Entamoeba histolytica NOT DETECTED NOT DETECTED Final   Giardia lamblia NOT DETECTED NOT DETECTED Final   Adenovirus F40/41 NOT DETECTED NOT DETECTED Final   Astrovirus NOT DETECTED NOT DETECTED Final   Norovirus GI/GII NOT DETECTED NOT DETECTED Final   Rotavirus Carisa Backhaus NOT DETECTED NOT DETECTED Final   Sapovirus (I, II, IV, and V) NOT DETECTED NOT DETECTED Final    Comment: Performed at [facility], [address] [place], [place] [address]     Labs: Basic Metabolic Panel: Recent Labs  Lab 01/13/24 0242 01/14/24 0508 01/15/24 0419 01/16/24 0602  NA 135 135 134* 137  K 3.9 3.5 3.5 3.5  CL 98 108 106 106  CO2 26 20* 20* 21*  GLUCOSE 125* 110* 98 111*  BUN 23 16 15 13   CREATININE 1.35* 1.14* 1.29* 1.02*  CALCIUM 9.0 7.9* 7.9* 8.2*  MG  --   --  1.6* 1.9  PHOS  --   --  2.7 2.5   Liver Function Tests: Recent Labs  Lab 01/13/24 0242 01/15/24 0419 01/16/24 0602  AST 20 15 15   ALT 12 12 11   ALKPHOS 44 39 39  BILITOT 0.3 0.6 <0.2  PROT 7.0 5.7* 5.4*  ALBUMIN 3.3* 2.3* 2.2*   No results for input(s): LIPASE, AMYLASE in the last 168 hours. No results for input(s): AMMONIA in the last 168 hours. CBC: Recent Labs  Lab 01/13/24 0242 01/15/24 0419 01/15/24 1731 01/16/24 0602  WBC 10.8* 10.9*  --  9.9  NEUTROABS 8.4* 9.1*  --  7.3  HGB 10.5* 8.6* 8.9* 8.7*  HCT 32.2* 26.4* 26.4* 25.9*  MCV 93.9 93.3  --  92.2  PLT 281 256  --  285   Cardiac Enzymes: No results for input(s): CKTOTAL, CKMB, CKMBINDEX, TROPONINI in the last 168 hours. BNP: BNP (last 3 results) Recent Labs    01/14/24 2327  BNP 774.0*    ProBNP  (last 3 results) No results for input(s): PROBNP in the last 8760 hours.  CBG: No results for input(s): GLUCAP in the last 168 hours.     Signed:  Meliton Monte MD.  [facility] 01/16/2024, 2:18 PM

## 2024-01-18 LAB — CULTURE, BLOOD (ROUTINE X 2): Culture: NO GROWTH

## 2024-01-29 ENCOUNTER — Other Ambulatory Visit: Payer: Self-pay | Admitting: Urology

## 2024-01-29 NOTE — Progress Notes (Signed)
 Sent message, via epic in basket, requesting orders in epic from Careers adviser.

## 2024-02-04 NOTE — Patient Instructions (Signed)
 SURGICAL WAITING ROOM VISITATION Patients having surgery or a procedure may have no more than 2 support people in the waiting area - these visitors may rotate in the visitor waiting room.   If the patient needs to stay at the hospital during part of their recovery, the visitor guidelines for inpatient rooms apply.  PRE-OP VISITATION  Pre-op nurse will coordinate an appropriate time for 1 support person to accompany the patient in pre-op.  This support person may not rotate.  This visitor will be contacted when the time is appropriate for the visitor to come back in the pre-op area.  Please refer to the [facility] website for the visitor guidelines for Inpatients (after your surgery is over and you are in a regular room).  You are not required to quarantine at this time prior to your surgery. However, you must do this: Hand Hygiene often Do NOT share personal items Notify your provider if you are in close contact with someone who has COVID or you develop fever 100.4 or greater, new onset of sneezing, cough, sore throat, shortness of breath or body aches.  If you test positive for Covid or have been in contact with anyone that has tested positive in the last 10 days please notify you surgeon.    Your procedure is scheduled on:  THURSDAY  February 19, 2024  Report to [facility] Main Entrance: [place] entrance where the [employer] is available.   Report to admitting at: 11:30    AM  Call this number if you have any questions or problems the morning of surgery [phone]  DO NOT EAT OR DRINK ANYTHING AFTER MIDNIGHT THE NIGHT PRIOR TO YOUR SURGERY / PROCEDURE.   FOLLOW  ANY ADDITIONAL PRE OP INSTRUCTIONS YOU RECEIVED FROM YOUR SURGEON'S OFFICE!!!   Oral Hygiene is also important to reduce your risk of infection.        Remember - BRUSH YOUR TEETH THE MORNING OF SURGERY WITH YOUR REGULAR TOOTHPASTE  Do NOT smoke after Midnight the night before surgery.  STOP TAKING all  Vitamins, Herbs and supplements 1 week before your surgery.   Take ONLY these medicines the morning of surgery with A SIP OF WATER: Mirabegron , Memantine , Donepezil , escitalopram . You may take pantoprazole  and Tylenol  if needed.   You may not have any metal on your body including hair pins, jewelry, and body piercing  Do not wear make-up, lotions, powders, perfumes or deodorant  Do not wear nail polish including gel and S&S, artificial / acrylic nails, or any other type of covering on natural nails including finger and toenails. If you have artificial nails, gel coating, etc., that needs to be removed by a nail salon, Please have this removed prior to surgery. Not doing so may mean that your surgery could be cancelled or delayed if the Surgeon or anesthesia staff feels like they are unable to monitor you safely.   Do not shave 48 hours prior to surgery to avoid nicks in your skin which may contribute to postoperative infections.   Contacts, Hearing Aids, dentures or bridgework may not be worn into surgery. DENTURES WILL BE REMOVED PRIOR TO SURGERY PLEASE DO NOT APPLY Poly grip OR ADHESIVES!!!  Patients discharged on the day of surgery will not be allowed to drive home.  Someone NEEDS to stay with you for the first 24 hours after anesthesia.  Do not bring your home medications to the hospital. The Pharmacy will dispense medications listed on your medication list to you  during your admission in the Hospital.  Please read over the following fact sheets you were given: IF YOU HAVE QUESTIONS ABOUT YOUR PRE-OP INSTRUCTIONS, PLEASE CALL [phone].   [place] - Preparing for Surgery Before surgery, you can play an important role.  Because skin is not sterile, your skin needs to be as free of germs as possible.  You can reduce the number of germs on your skin by washing with CHG (chlorahexidine gluconate) soap before surgery.  CHG is an antiseptic cleaner which kills germs and bonds with the  skin to continue killing germs even after washing. Please DO NOT use if you have an allergy  to CHG or antibacterial soaps.  If your skin becomes reddened/irritated stop using the CHG and inform your nurse when you arrive at Short Stay. Do not shave (including legs and underarms) for at least 48 hours prior to the first CHG shower.  You may shave your face/neck.  Please follow these instructions carefully:  1.  Shower with CHG Soap the night before surgery and the  morning of surgery.  2.  If you choose to wash your hair, wash your hair first as usual with your normal  shampoo.  3.  After you shampoo, rinse your hair and body thoroughly to remove the shampoo.                             4.  Use CHG as you would any other liquid soap.  You can apply chg directly to the skin and wash.  Gently with a scrungie or clean washcloth.  5.  Apply the CHG Soap to your body ONLY FROM THE NECK DOWN.   Do not use on face/ open                           Wound or open sores. Avoid contact with eyes, ears mouth and genitals (private parts).                       Wash face,  Genitals (private parts) with your normal soap.             6.  Wash thoroughly, paying special attention to the area where your  surgery  will be performed.  7.  Thoroughly rinse your body with warm water from the neck down.  8.  DO NOT shower/wash with your normal soap after using and rinsing off the CHG Soap.            9.  Pat yourself dry with a clean towel.            10.  Wear clean pajamas.            11.  Place clean sheets on your bed the night of your first shower and do not  sleep with pets.  ON THE DAY OF SURGERY : Do not apply any lotions/deodorants the morning of surgery.  Please wear clean clothes to the hospital/surgery center.    FAILURE TO FOLLOW THESE INSTRUCTIONS MAY RESULT IN THE CANCELLATION OF YOUR SURGERY  PATIENT SIGNATURE_________________________________  NURSE  SIGNATURE__________________________________  ________________________________________________________________________

## 2024-02-04 NOTE — Progress Notes (Signed)
 The patient 's husband identified her using 2 approved identifiers. Patient has Dementia and Memory loss. All issues noted in this document were discussed and addressed, Mr [name], voiced understanding and agreement with all preoperative instructions.   The patient was emailed the surgery instructions per his request.      The patient was instructed to call our  Admitting Office [phone] or [phone]) to complete their Pre-surgical Interview.    MED RECON. Done on 01-30-24   COVID Vaccine received:  []  No [x]  Yes Date of any COVID positive Test in last 90 days:  none  PCP - Meghan Minors, FNP at [facility]  Cardiologist - none Neurology- Richardson Metro, MD  Chest x-ray - 01-26-2024  2v  [facility] EKG - 01-13-24  Epic  Stress Test -  ECHO - 01-15-2024  Epic Cardiac Cath -  CT Coronary Calcium score:   Bowel Prep - [x]  No  []   Yes ______  Pacemaker / ICD device [x]  No []  Yes   Spinal Cord Stimulator:[x]  No []  Yes       History of Sleep Apnea? [x]  No []  Yes   CPAP used?- [x]  No []  Yes    Does the patient monitor blood sugar?   [x]  N/A   []  No []  Yes  Patient has: [x]  NO Hx DM   []  Pre-DM   []  DM1  []   DM2  Blood Thinner / Instructions:  none Aspirin  Instructions:  ASA 81 mg   Hold x 7 days, patient aware.   ERAS Protocol Ordered: [x]  No  []  Yes Patient is to be NPO after: MN prior  Dental hx: []  Dentures:  []  N/A      []  Bridge or Partial:                   [x]  Loose or Damaged teeth:   Activity level: Able to walk up 2 flights of stairs without becoming significantly short of breath or having chest pain?  [x]  No   []    Yes   Anesthesia review: Dementia on meds (Memory loss), anxiety, GERD,  Hx TIA, HOH-  HAs ,  COPD,  Patient denies any S&S of respiratory illness or Covid - no shortness of breath, fever, cough or chest pain at PAT appointment.

## 2024-02-05 ENCOUNTER — Encounter (HOSPITAL_COMMUNITY)
Admission: RE | Admit: 2024-02-05 | Discharge: 2024-02-05 | Disposition: A | Discharge status: Home / Self Care | Source: Ambulatory Visit | Attending: Urology | Admitting: Urology

## 2024-02-05 ENCOUNTER — Encounter (HOSPITAL_COMMUNITY): Payer: Self-pay

## 2024-02-05 HISTORY — DX: Unspecified osteoarthritis, unspecified site: M19.90

## 2024-02-05 HISTORY — DX: Anemia, unspecified: D64.9

## 2024-02-05 HISTORY — DX: Chronic obstructive pulmonary disease, unspecified: J44.9

## 2024-02-05 HISTORY — DX: Unspecified dementia, unspecified severity, without behavioral disturbance, psychotic disturbance, mood disturbance, and anxiety: F03.90

## 2024-02-05 HISTORY — DX: Anxiety disorder, unspecified: F41.9

## 2024-02-05 HISTORY — DX: Gastro-esophageal reflux disease without esophagitis: K21.9

## 2024-02-05 HISTORY — DX: Unspecified macular degeneration: H35.30

## 2024-02-05 HISTORY — DX: Cerebral infarction, unspecified: I63.9

## 2024-02-05 HISTORY — DX: Chronic kidney disease, unspecified: N18.9

## 2024-02-05 HISTORY — DX: Nicotine dependence, unspecified, uncomplicated: F17.200

## 2024-02-05 HISTORY — DX: Personal history of urinary calculi: Z87.442

## 2024-02-09 ENCOUNTER — Encounter (HOSPITAL_COMMUNITY): Payer: Self-pay

## 2024-02-09 NOTE — Progress Notes (Signed)
 Case: [id number] Date/Time: 02/19/24 1330   Procedure: CYSTOSCOPY/URETEROSCOPY/HOLMIUM LASER/STENT PLACEMENT (Left) - CYSTOSCOPY/LEFT URETEROSCOPY/HOLMIUM LASER/STENT EXCHANGE   Anesthesia type: General   Diagnosis: Ureteral calculus [N20.1]   Pre-op diagnosis: LEFT URETERAL CALCULUS   Location: [facility] PROCEDURE [facility] / [facility]   Surgeons: Elisabeth Valli BIRCH, MD       DISCUSSION: [name] is an 83 year old female who was evaluated prior to surgery above.  Past medical history of current smoking, COPD (by imaging), vascular dementia, history of TIA, GERD, arthritis, anxiety, anemia  Patient was admitted from 6/24 to 6/27 due to urosepsis due to UTI and obstructive kidney stones.  She underwent left ureteral stent placement on 01/14/2024 with Dr. Elisabeth.  Now scheduled for definitive surgery.  Patient also had issues with volume overload with bilateral pleural effusions and anasarca.  Echo showed normal LVEF with moderate pulmonary hypertension, moderate TR.  She was treated with Lasix .  Seen by PCP for hospital follow-up on 01/26/2024.  Noted to be improving. Chest x-ray on 7/7 showed clear lungs. Has issues with chronic diarrhea which are improving.  Follows with Neurology for vascular dementia. On Aricept  and Namenda .   VS: Ht 4' 10 (1.473 m)   Wt 41.7 kg   BMI 19.23 kg/m   PROVIDERS: Patti Mliss LABOR, FNP   LABS: Labs reviewed: Acceptable for surgery.  Anemia stable (all labs ordered are listed, but only abnormal results are displayed)  Labs Reviewed - No data to display   IMAGES: Chest x-ray 01/26/2024:  FINDINGS:  Supportive devices: None. Cardiovascular/lungs/pleura: Aortic arch calcifications. Normal cardiomediastinal silhouette. No pleural effusions, pneumothorax, or focal airspace opacities. Other: No acute osseous findings. Chronic T10 compression fracture without interval height loss. Abdominal aortic calcifications. Cholecystectomy clips. Partially imaged left double-J  ureteral stent. Redemonstrated left renal calculus.  EKG 01/13/2024:  Normal sinus rhythm, rate 80  CV:  Echo 01/15/2024:  IMPRESSIONS    1. Left ventricular ejection fraction, by estimation, is 65 to 70%. The left ventricle has normal function. The average left ventricular global longitudinal strain is -20.4 %. The global longitudinal strain is normal.  2. Right ventricular systolic function is normal. The right ventricular size is moderately enlarged. There is moderately elevated pulmonary artery systolic pressure. The estimated right ventricular systolic pressure is 50.8 mmHg.  3. Left atrial size was mildly dilated.  4. Right atrial size was moderately dilated.  5. The mitral valve is degenerative. Trivial mitral valve regurgitation.  6. Tricuspid valve regurgitation is moderate.  7. The aortic valve is tricuspid. Aortic valve regurgitation is not visualized. No aortic stenosis is present.  8. The inferior vena cava is dilated in size with >50% respiratory variability, suggesting right atrial pressure of 8 mmHg.  Past Medical History:  Diagnosis Date   Anemia    Anxiety    Arthritis    Chronic kidney disease    COPD (chronic obstructive pulmonary disease) (HCC)    Dementia (HCC)    vascular dementia   GERD (gastroesophageal reflux disease)    History of kidney stones    Macular degeneration of left eye    Wet and gets injections q monthly   Smoker    1- 1 1/2 ppd  per Husband   Stroke [facility])    TIAs    Past Surgical History:  Procedure Laterality Date   CHOLECYSTECTOMY  10/24/2010   Laparoscopic   COLECTOMY  07/22/2004   BENIGN TUMOR   CYSTOSCOPY W/ URETERAL STENT PLACEMENT Left 01/14/2024   Procedure: CYSTOSCOPY, WITH  RETROGRADE PYELOGRAM AND URETERAL STENT INSERTION;  Surgeon: Elisabeth Valli BIRCH, MD;  Location: [facility];  Service: Urology;  Laterality: Left;   DILATION AND CURETTAGE OF UTERUS     EYE SURGERY Bilateral    cataract extraction   TRANSESOPHAGEAL  ECHOCARDIOGRAM (CATH LAB) N/A 10/17/2023   Procedure: TRANSESOPHAGEAL ECHOCARDIOGRAM;  Surgeon: Pietro Redell RAMAN, MD;  Location: [facility] INVASIVE CV LAB;  Service: Cardiovascular;  Laterality: N/A;    MEDICATIONS:  aspirin  EC 81 MG tablet   BLINK TEARS 0.25 % SOLN   Calcium-Vitamin D-Vitamin K 650-12.5-40 MG-MCG-MCG CHEW   cyanocobalamin  (VITAMIN B12) 1000 MCG tablet   donepezil  (ARICEPT ) 10 MG tablet   escitalopram  (LEXAPRO ) 20 MG tablet   Ibuprofen 200 MG CAPS   memantine  (NAMENDA ) 5 MG tablet   mirabegron  ER (MYRBETRIQ ) 50 MG TB24 tablet   Multiple Vitamins-Minerals (ONE A DAY WOMEN 50 PLUS PO)   Multiple Vitamins-Minerals (PRESERVISION AREDS 2) CAPS   pantoprazole  (PROTONIX ) 40 MG tablet   Probiotic Product (PROBIOTIC PO)   TYLENOL  500 MG tablet   No current facility-administered medications for this encounter.   Burnard CHRISTELLA Odis DEVONNA [facility] Short Stay/Anesthesiology [facility] Phone [phone] 02/09/2024 2:13 PM

## 2024-02-09 NOTE — Telephone Encounter (Signed)
 Copied from CRM #[id number]. Topic: Medication/Rx - Rx Refill >> Feb 09, 2024 11:49 AM Sari DASEN wrote: Mr. Gowin is calling for medication request (Obtain: Medication Name, Dosage, Quantity, Frequency, Quantity Requested (example: 30-day supply.)   Include all details related to the request(s) below:  Patient husband called on her behalf to request a new prescription for the pantoprazole  (PROTONIX ) 40 mg EC tablet     Confirm and type the Best Contact Number below:  Patient/caller contact number:          [phone]   [] Home  [] Mobile  [] Work [] Other   [] Okay to leave a voicemail   Medication List:  Current Outpatient Medications:  .  alpha tocopherol (VITAMIN E) 268 mg (400 unit) capsule, Take 400 Units by mouth daily., Disp: , Rfl:  .  aspirin  81 mg EC tablet, Take 81 mg by mouth daily., Disp: , Rfl:  .  cholecalciferol (VITAMIN D3) 1,000 unit (25 mcg) tablet, Take 1,000 Units by mouth., Disp: , Rfl:  .  cyanocobalamin /folic acid (vitamin B12-folic acid) 500-400 mcg tab, Take 1 tablet by mouth daily., Disp: , Rfl:  .  donepeziL  (ARICEPT ) 10 mg tablet, Take 1 tablet (10 mg total) by mouth daily., Disp: 90 tablet, Rfl: 1 .  escitalopram  (LEXAPRO ) 20 mg tablet, Take 1 tablet (20 mg total) by mouth daily., Disp: 90 tablet, Rfl: 2 .  L. acidophilus-L. rhamnosus (Florajen Women) 15 billion cell cap, Take  by mouth Once Daily., Disp: , Rfl:  .  memantine  (NAMENDA ) 5 mg tablet, Take 1 tablet (5 mg total) by mouth 2 (two) times a day., Disp: 180 tablet, Rfl: 1 .  mirabegron  (MYRBETRIQ ) 50 mg Tb24, Take 1 tablet (50 mg total) by mouth daily., Disp: 30 tablet, Rfl: 11 .  multivitamin cap, Take 1 capsule by mouth., Disp: , Rfl:  .  pantoprazole  (PROTONIX ) 40 mg EC tablet, Take 40 mg by mouth every morning before breakfast. Added during hospitalization 6.24.25, Disp: , Rfl:  .  triamcinolone acetonide (KENALOG) 0.1 % cream, Apply topically 2 (two) times a day. Apply to sore on left ear twice  daily., Disp: 15 g, Rfl: 1     Medication Request/Refills: Pharmacy Information (if applicable)   [] Not Applicable       [x]  Pharmacy listed  Send Medication Request to:                                                 [] Pharmacy not listed (added to pharmacy list in Epic) Send Medication Request to:      Listed Pharmacies: [facility] - [place], [place] - [address] - PHONE: [phone] - FAX: [phone]

## 2024-02-09 NOTE — Telephone Encounter (Signed)
 Rx sent.

## 2024-02-09 NOTE — Anesthesia Preprocedure Evaluation (Addendum)
 Anesthesia Evaluation    Airway        Dental   Pulmonary Current Smoker          Cardiovascular      Neuro/Psych    GI/Hepatic   Endo/Other    Renal/GU      Musculoskeletal   Abdominal   Peds  Hematology   Anesthesia Other Findings   Reproductive/Obstetrics                              Anesthesia Physical Anesthesia Plan  ASA:   Anesthesia Plan:    Post-op Pain Management:    Induction:   PONV Risk Score and Plan:   Airway Management Planned:   Additional Equipment:   Intra-op Plan:   Post-operative Plan:   Informed Consent:   Plan Discussed with:   Anesthesia Plan Comments: (See PAT note from 7/17  Case cancelled for NPO status)         Anesthesia Quick Evaluation

## 2024-02-19 ENCOUNTER — Encounter (HOSPITAL_COMMUNITY)
Admission: RE | Disposition: A | Discharge status: Home / Self Care | Payer: Self-pay | Source: Home / Self Care | Attending: Urology

## 2024-02-19 ENCOUNTER — Encounter (HOSPITAL_COMMUNITY): Payer: Self-pay | Admitting: Medical

## 2024-02-19 ENCOUNTER — Ambulatory Visit (HOSPITAL_COMMUNITY)
Admission: RE | Admit: 2024-02-19 | Discharge: 2024-02-19 | Disposition: A | Discharge status: Home / Self Care | Attending: Urology | Admitting: Urology

## 2024-02-19 ENCOUNTER — Encounter (HOSPITAL_COMMUNITY): Payer: Self-pay | Admitting: Anesthesiology

## 2024-02-19 DIAGNOSIS — Z539 Procedure and treatment not carried out, unspecified reason: Secondary | ICD-10-CM | POA: Insufficient documentation

## 2024-02-19 DIAGNOSIS — N200 Calculus of kidney: Secondary | ICD-10-CM

## 2024-02-19 DIAGNOSIS — N201 Calculus of ureter: Secondary | ICD-10-CM | POA: Insufficient documentation

## 2024-02-19 SURGERY — CYSTOSCOPY/URETEROSCOPY/HOLMIUM LASER/STENT PLACEMENT
Anesthesia: General | Laterality: Left

## 2024-02-19 MED ORDER — LACTATED RINGERS IV SOLN: INTRAVENOUS | Status: DC

## 2024-02-19 MED ORDER — FENTANYL CITRATE (PF) 100 MCG/2ML IJ SOLN
INTRAMUSCULAR | Status: AC
Start: 2024-02-19 — End: 2024-02-19
  Filled 2024-02-19: qty 2

## 2024-02-19 MED ORDER — ONDANSETRON HCL 4 MG/2ML IJ SOLN
INTRAMUSCULAR | Status: AC
  Filled 2024-02-19: qty 2

## 2024-02-19 MED ORDER — MIDAZOLAM HCL 2 MG/2ML IJ SOLN
INTRAMUSCULAR | Status: AC
  Filled 2024-02-19: qty 2

## 2024-02-19 MED ORDER — DEXAMETHASONE SODIUM PHOSPHATE 10 MG/ML IJ SOLN
INTRAMUSCULAR | Status: AC
  Filled 2024-02-19: qty 1

## 2024-02-19 MED ORDER — PROPOFOL 10 MG/ML IV BOLUS
INTRAVENOUS | Status: AC
  Filled 2024-02-19: qty 20

## 2024-02-19 MED ORDER — ORAL CARE MOUTH RINSE: 15.0000 mL | Freq: Once | OROMUCOSAL | Status: DC

## 2024-02-19 MED ORDER — PHENYLEPHRINE 80 MCG/ML (10ML) SYRINGE FOR IV PUSH (FOR BLOOD PRESSURE SUPPORT)
PREFILLED_SYRINGE | INTRAVENOUS | Status: AC
Start: 2024-02-19 — End: 2024-02-19
  Filled 2024-02-19: qty 10

## 2024-02-19 MED ORDER — ACETAMINOPHEN 500 MG PO TABS: 1000.0000 mg | ORAL_TABLET | Freq: Once | ORAL | Status: DC

## 2024-02-19 MED ORDER — LIDOCAINE HCL (PF) 2 % IJ SOLN
INTRAMUSCULAR | Status: AC
  Filled 2024-02-19: qty 5

## 2024-02-19 MED ORDER — CEFAZOLIN SODIUM-DEXTROSE 2-4 GM/100ML-% IV SOLN: 2.0000 g | INTRAVENOUS | Status: DC

## 2024-02-19 MED ORDER — CHLORHEXIDINE GLUCONATE 0.12 % MT SOLN: 15.0000 mL | Freq: Once | OROMUCOSAL | Status: DC

## 2024-02-19 MED ORDER — EPHEDRINE 5 MG/ML INJ
INTRAVENOUS | Status: AC
Start: 2024-02-19 — End: 2024-02-19
  Filled 2024-02-19: qty 5

## 2024-02-19 NOTE — Progress Notes (Signed)
 Per Dr Elisabeth Patient is cancelled today because she had solid food with medications at 0900 am

## 2024-02-19 NOTE — Progress Notes (Signed)
 Patient ate donut this morning and would postpone patient surgery past 5pm.  Will reschedule. UA today.  Discussed with husband and patient and they understand.  Will also consider adding right side at next time as well.

## 2024-03-02 ENCOUNTER — Other Ambulatory Visit: Payer: Self-pay | Admitting: Urology

## 2024-03-08 NOTE — Patient Instructions (Addendum)
 SURGICAL WAITING ROOM VISITATION  Patients having surgery or a procedure may have no more than 2 support people in the waiting area - these visitors may rotate.    Children under the age of 88 must have an adult with them who is not the patient.  Visitors with respiratory illnesses are discouraged from visiting and should remain at home.  If the patient needs to stay at the hospital during part of their recovery, the visitor guidelines for inpatient rooms apply. Pre-op nurse will coordinate an appropriate time for 1 support person to accompany patient in pre-op.  This support person may not rotate.    Please refer to the [facility] website for the visitor guidelines for Inpatients (after your surgery is over and you are in a regular room).       Your procedure is scheduled on: 03/11/24   Report to [facility] Main Entrance    Report to admitting at 9 AM   Call this number if you have problems the morning of surgery [phone]   Do not eat food or drink liquids :After Midnight.but may have sips of water to take meds.        Oral Hygiene is also important to reduce your risk of infection.                                    Remember - BRUSH YOUR TEETH THE MORNING OF SURGERY WITH YOUR REGULAR TOOTHPASTE  DENTURES WILL BE REMOVED PRIOR TO SURGERY PLEASE DO NOT APPLY Poly grip OR ADHESIVES!!!   Stop all vitamins and herbal supplements 7 days before surgery.   Take these medicines the morning of surgery with A SIP OF WATER: donepezil (aricept ), Escitaprolam(lexapro ), Myrbetriq (mirabegron ), Pantoprazole (protonix )             You may not have any metal on your body including hair pins, jewelry, and body piercing             Do not wear make-up, lotions, powders, perfumes/cologne, or deodorant  Do not wear nail polish including gel and S&S, artificial/acrylic nails, or any other type of covering on natural nails including finger and toenails. If you have artificial nails,  gel coating, etc. that needs to be removed by a nail salon please have this removed prior to surgery or surgery may need to be canceled/ delayed if the surgeon/ anesthesia feels like they are unable to be safely monitored.   Do not shave  48 hours prior to surgery.    Do not bring valuables to the hospital. [facility] IS NOT             RESPONSIBLE   FOR VALUABLES.   Contacts, glasses, dentures or bridgework may not be worn into surgery.  DO NOT BRING YOUR HOME MEDICATIONS TO THE HOSPITAL. PHARMACY WILL DISPENSE MEDICATIONS LISTED ON YOUR MEDICATION LIST TO YOU DURING YOUR ADMISSION IN THE HOSPITAL!    Patients discharged on the day of surgery will not be allowed to drive home.  Someone NEEDS to stay with you for the first 24 hours after anesthesia.   Special Instructions: Bring a copy of your healthcare power of attorney and living will documents the day of surgery if you haven't scanned them before.              Please read over the following fact sheets you were given: IF YOU HAVE QUESTIONS ABOUT YOUR PRE-OP INSTRUCTIONS  PLEASE CALL [phone] [place]   If you received a COVID test during your pre-op visit  it is requested that you wear a mask when out in public, stay away from anyone that may not be feeling well and notify your surgeon if you develop symptoms. If you test positive for Covid or have been in contact with anyone that has tested positive in the last 10 days please notify you surgeon.    [place] - Preparing for Surgery Before surgery, you can play an important role.  Because skin is not sterile, your skin needs to be as free of germs as possible.  You can reduce the number of germs on your skin by washing with CHG (chlorahexidine gluconate) soap before surgery.  CHG is an antiseptic cleaner which kills germs and bonds with the skin to continue killing germs even after washing. Please DO NOT use if you have an allergy  to CHG or antibacterial soaps.  If your skin becomes  reddened/irritated stop using the CHG and inform your nurse when you arrive at Short Stay. Do not shave (including legs and underarms) for at least 48 hours prior to the first CHG shower.  You may shave your face/neck.  Please follow these instructions carefully:  1.  Shower with CHG Soap the night before surgery and the  morning of surgery.  2.  If you choose to wash your hair, wash your hair first as usual with your normal  shampoo.  3.  After you shampoo, rinse your hair and body thoroughly to remove the shampoo.                             4.  Use CHG as you would any other liquid soap.  You can apply chg directly to the skin and wash.  Gently with a scrungie or clean washcloth.  5.  Apply the CHG Soap to your body ONLY FROM THE NECK DOWN.   Do   not use on face/ open                           Wound or open sores. Avoid contact with eyes, ears mouth and   genitals (private parts).                       Wash face,  Genitals (private parts) with your normal soap.             6.  Wash thoroughly, paying special attention to the area where your    surgery  will be performed.  7.  Thoroughly rinse your body with warm water from the neck down.  8.  DO NOT shower/wash with your normal soap after using and rinsing off the CHG Soap.                9.  Pat yourself dry with a clean towel.            10.  Wear clean pajamas.            11.  Place clean sheets on your bed the night of your first shower and do not  sleep with pets. Day of Surgery : Do not apply any lotions/deodorants the morning of surgery.  Please wear clean clothes to the hospital/surgery center.  FAILURE TO FOLLOW THESE INSTRUCTIONS MAY RESULT IN THE CANCELLATION OF YOUR SURGERY  PATIENT SIGNATURE_________________________________  NURSE SIGNATURE__________________________________  ________________________________________________________________________

## 2024-03-08 NOTE — Progress Notes (Signed)
 COVID Vaccine received:  []  No [x]  Yes Date of any COVID positive Test in last 90 days: no PCP - Mliss Minors FNP Cardiologist - n/a Neuro- [facility]  Chest x-ray - 01/16/24 Epic EKG -  01/13/24 Epic Stress Test -  ECHO - 01/15/24 Epic Cardiac Cath -   Bowel Prep - [x]  No  []   Yes ______  Pacemaker / ICD device [x]  No []  Yes   Spinal Cord Stimulator:[x]  No []  Yes       History of Sleep Apnea? [x]  No []  Yes   CPAP used?- [x]  No []  Yes    Does the patient monitor blood sugar?          [x]  No []  Yes  []  N/A  Patient has: [x]  NO Hx DM   []  Pre-DM                 []  DM1  []   DM2 Does patient have a [employer] or Dexacom? []  No []  Yes   Fasting Blood Sugar Ranges-  Checks Blood Sugar _____ times a day  GLP1 agonist / usual dose - no GLP1 instructions:  SGLT-2 inhibitors / usual dose - no SGLT-2 instructions:   Blood Thinner / Instructions:n/a Aspirin  Instructions:ASA 81mg  last dose 02/28/24  Comments:   Activity level: Patient is able to climb a flight of stairs without difficulty; [x]  No CP  [x]  No SOB,   Patient can perform ADLs without assistance.   Anesthesia review: CKD, COPD, TIAs, Smoker, vascular dementia  Patient denies shortness of breath, fever, cough and chest pain at PAT appointment.  Patient verbalized understanding and agreement to the Pre-Surgical Instructions that were given to them at this PAT appointment. Patient was also educated of the need to review these PAT instructions again prior to his/her surgery.I reviewed the appropriate phone numbers to call if they have any and questions or concerns.

## 2024-03-09 ENCOUNTER — Encounter (HOSPITAL_COMMUNITY): Payer: Self-pay

## 2024-03-09 ENCOUNTER — Encounter (HOSPITAL_COMMUNITY)
Admission: RE | Admit: 2024-03-09 | Discharge: 2024-03-09 | Disposition: A | Discharge status: Home / Self Care | Source: Ambulatory Visit | Attending: Urology | Admitting: Urology

## 2024-03-09 ENCOUNTER — Other Ambulatory Visit: Payer: Self-pay

## 2024-03-09 VITALS — BP 134/59 | HR 74 | Temp 98.0°F | Resp 16 | Ht <= 58 in | Wt 94.0 lb

## 2024-03-09 DIAGNOSIS — Z01812 Encounter for preprocedural laboratory examination: Secondary | ICD-10-CM | POA: Insufficient documentation

## 2024-03-09 DIAGNOSIS — A419 Sepsis, unspecified organism: Secondary | ICD-10-CM | POA: Diagnosis not present

## 2024-03-09 DIAGNOSIS — A4181 Sepsis due to Enterococcus: Secondary | ICD-10-CM | POA: Diagnosis not present

## 2024-03-09 DIAGNOSIS — Z01818 Encounter for other preprocedural examination: Secondary | ICD-10-CM

## 2024-03-09 LAB — BASIC METABOLIC PANEL WITH GFR
Anion gap: 9 (ref 5–15)
BUN: 26 mg/dL — ABNORMAL HIGH (ref 8–23)
CO2: 26 mmol/L (ref 22–32)
Calcium: 9.3 mg/dL (ref 8.9–10.3)
Chloride: 102 mmol/L (ref 98–111)
Creatinine, Ser: 0.94 mg/dL (ref 0.44–1.00)
GFR, Estimated: 60 mL/min — ABNORMAL LOW (ref >60)
Glucose, Bld: 106 mg/dL — ABNORMAL HIGH (ref 70–99)
Potassium: 3.9 mmol/L (ref 3.5–5.1)
Sodium: 137 mmol/L (ref 135–145)

## 2024-03-09 LAB — CBC
HCT: 33.2 % — ABNORMAL LOW (ref 36.0–46.0)
Hemoglobin: 10.4 g/dL — ABNORMAL LOW (ref 12.0–15.0)
MCH: 29.6 pg (ref 26.0–34.0)
MCHC: 31.3 g/dL (ref 30.0–36.0)
MCV: 94.6 fL (ref 80.0–100.0)
Platelets: 330 K/uL (ref 150–400)
RBC: 3.51 MIL/uL — ABNORMAL LOW (ref 3.87–5.11)
RDW: 14.4 % (ref 11.5–15.5)
WBC: 7.5 K/uL (ref 4.0–10.5)
nRBC: 0 % (ref 0.0–0.2)

## 2024-03-10 NOTE — Anesthesia Preprocedure Evaluation (Signed)
 Anesthesia Evaluation  Patient identified by MRN, date of birth, ID band Patient awake    Reviewed: Allergy  & Precautions, NPO status , Patient's Chart, lab work & pertinent test results  Airway Mallampati: II  TM Distance: >3 FB Neck ROM: Full    Dental  (+) Upper Dentures, Partial Lower, Missing, Dental Advisory Given   Pulmonary COPD, Current Smoker   breath sounds clear to auscultation       Cardiovascular (-) Past MI negative cardio ROS  Rhythm:Regular Rate:Normal     Neuro/Psych  PSYCHIATRIC DISORDERS Anxiety Depression   Dementia CVA    GI/Hepatic Neg liver ROS,GERD  ,,  Endo/Other  negative endocrine ROS    Renal/GU ARFRenal disease     Musculoskeletal  (+) Arthritis ,    Abdominal   Peds  Hematology  (+) Blood dyscrasia, anemia   Anesthesia Other Findings   Reproductive/Obstetrics                              Anesthesia Physical Anesthesia Plan  ASA: 3  Anesthesia Plan: General   Post-op Pain Management:    Induction: Intravenous  PONV Risk Score and Plan: 2 and Propofol  infusion and Ondansetron   Airway Management Planned: LMA  Additional Equipment: None  Intra-op Plan:   Post-operative Plan: Extubation in OR  Informed Consent: I have reviewed the patients History and Physical, chart, labs and discussed the procedure including the risks, benefits and alternatives for the proposed anesthesia with the patient or authorized representative who has indicated his/her understanding and acceptance.       Plan Discussed with:   Anesthesia Plan Comments: (Reviewed on 7/17. Pt was canceled DOS due to eating a donut. Now rescheduled. Seen by PCP on 8/12 and all issues stable. Pre op labs stable)        Anesthesia Quick Evaluation

## 2024-03-11 ENCOUNTER — Encounter (HOSPITAL_COMMUNITY)
Admission: RE | Disposition: A | Discharge status: Home / Self Care | Payer: Self-pay | Source: Home / Self Care | Attending: Urology

## 2024-03-11 ENCOUNTER — Encounter (HOSPITAL_COMMUNITY): Payer: Self-pay | Admitting: Urology

## 2024-03-11 ENCOUNTER — Emergency Department (HOSPITAL_COMMUNITY)

## 2024-03-11 ENCOUNTER — Encounter (HOSPITAL_COMMUNITY): Payer: Self-pay | Admitting: Internal Medicine

## 2024-03-11 ENCOUNTER — Ambulatory Visit (HOSPITAL_COMMUNITY)
Admission: RE | Admit: 2024-03-11 | Discharge: 2024-03-11 | Disposition: A | Discharge status: Home / Self Care | Source: Home / Self Care | Attending: Urology | Admitting: Urology

## 2024-03-11 ENCOUNTER — Other Ambulatory Visit: Payer: Self-pay

## 2024-03-11 ENCOUNTER — Ambulatory Visit (HOSPITAL_COMMUNITY): Payer: Self-pay | Admitting: Medical

## 2024-03-11 ENCOUNTER — Ambulatory Visit (HOSPITAL_COMMUNITY)

## 2024-03-11 ENCOUNTER — Ambulatory Visit (HOSPITAL_COMMUNITY): Admitting: Anesthesiology

## 2024-03-11 ENCOUNTER — Inpatient Hospital Stay (HOSPITAL_COMMUNITY)
Admission: EM | Admit: 2024-03-11 | Discharge: 2024-03-20 | DRG: 853 | Disposition: A | Discharge status: Home / Self Care | Attending: Family Medicine | Admitting: Family Medicine

## 2024-03-11 DIAGNOSIS — Z01812 Encounter for preprocedural laboratory examination: Secondary | ICD-10-CM

## 2024-03-11 DIAGNOSIS — G9341 Metabolic encephalopathy: Secondary | ICD-10-CM | POA: Diagnosis present

## 2024-03-11 DIAGNOSIS — Z9841 Cataract extraction status, right eye: Secondary | ICD-10-CM

## 2024-03-11 DIAGNOSIS — R4182 Altered mental status, unspecified: Secondary | ICD-10-CM

## 2024-03-11 DIAGNOSIS — F17200 Nicotine dependence, unspecified, uncomplicated: Secondary | ICD-10-CM

## 2024-03-11 DIAGNOSIS — E876 Hypokalemia: Secondary | ICD-10-CM | POA: Diagnosis present

## 2024-03-11 DIAGNOSIS — Z79899 Other long term (current) drug therapy: Secondary | ICD-10-CM

## 2024-03-11 DIAGNOSIS — N202 Calculus of kidney with calculus of ureter: Secondary | ICD-10-CM | POA: Diagnosis present

## 2024-03-11 DIAGNOSIS — Z882 Allergy status to sulfonamides status: Secondary | ICD-10-CM

## 2024-03-11 DIAGNOSIS — F01B0 Vascular dementia, moderate, without behavioral disturbance, psychotic disturbance, mood disturbance, and anxiety: Secondary | ICD-10-CM | POA: Diagnosis present

## 2024-03-11 DIAGNOSIS — F418 Other specified anxiety disorders: Secondary | ICD-10-CM

## 2024-03-11 DIAGNOSIS — N179 Acute kidney failure, unspecified: Secondary | ICD-10-CM | POA: Diagnosis present

## 2024-03-11 DIAGNOSIS — D631 Anemia in chronic kidney disease: Secondary | ICD-10-CM | POA: Diagnosis present

## 2024-03-11 DIAGNOSIS — Z833 Family history of diabetes mellitus: Secondary | ICD-10-CM | POA: Diagnosis not present

## 2024-03-11 DIAGNOSIS — A419 Sepsis, unspecified organism: Secondary | ICD-10-CM | POA: Diagnosis present

## 2024-03-11 DIAGNOSIS — R6520 Severe sepsis without septic shock: Secondary | ICD-10-CM | POA: Diagnosis present

## 2024-03-11 DIAGNOSIS — Z8249 Family history of ischemic heart disease and other diseases of the circulatory system: Secondary | ICD-10-CM

## 2024-03-11 DIAGNOSIS — R197 Diarrhea, unspecified: Secondary | ICD-10-CM | POA: Diagnosis present

## 2024-03-11 DIAGNOSIS — N390 Urinary tract infection, site not specified: Secondary | ICD-10-CM | POA: Diagnosis present

## 2024-03-11 DIAGNOSIS — F32A Depression, unspecified: Secondary | ICD-10-CM | POA: Insufficient documentation

## 2024-03-11 DIAGNOSIS — M1990 Unspecified osteoarthritis, unspecified site: Secondary | ICD-10-CM | POA: Insufficient documentation

## 2024-03-11 DIAGNOSIS — B952 Enterococcus as the cause of diseases classified elsewhere: Secondary | ICD-10-CM

## 2024-03-11 DIAGNOSIS — K219 Gastro-esophageal reflux disease without esophagitis: Secondary | ICD-10-CM | POA: Insufficient documentation

## 2024-03-11 DIAGNOSIS — F17210 Nicotine dependence, cigarettes, uncomplicated: Secondary | ICD-10-CM | POA: Insufficient documentation

## 2024-03-11 DIAGNOSIS — Z9842 Cataract extraction status, left eye: Secondary | ICD-10-CM

## 2024-03-11 DIAGNOSIS — N3001 Acute cystitis with hematuria: Secondary | ICD-10-CM | POA: Diagnosis not present

## 2024-03-11 DIAGNOSIS — A4181 Sepsis due to Enterococcus: Secondary | ICD-10-CM | POA: Diagnosis present

## 2024-03-11 DIAGNOSIS — Z87442 Personal history of urinary calculi: Secondary | ICD-10-CM

## 2024-03-11 DIAGNOSIS — Z8673 Personal history of transient ischemic attack (TIA), and cerebral infarction without residual deficits: Secondary | ICD-10-CM | POA: Diagnosis not present

## 2024-03-11 DIAGNOSIS — G9340 Encephalopathy, unspecified: Secondary | ICD-10-CM | POA: Diagnosis not present

## 2024-03-11 DIAGNOSIS — J449 Chronic obstructive pulmonary disease, unspecified: Secondary | ICD-10-CM

## 2024-03-11 DIAGNOSIS — Z7982 Long term (current) use of aspirin: Secondary | ICD-10-CM

## 2024-03-11 DIAGNOSIS — Z9049 Acquired absence of other specified parts of digestive tract: Secondary | ICD-10-CM | POA: Diagnosis not present

## 2024-03-11 DIAGNOSIS — R413 Other amnesia: Secondary | ICD-10-CM | POA: Diagnosis present

## 2024-03-11 DIAGNOSIS — D649 Anemia, unspecified: Secondary | ICD-10-CM | POA: Insufficient documentation

## 2024-03-11 DIAGNOSIS — F419 Anxiety disorder, unspecified: Secondary | ICD-10-CM | POA: Insufficient documentation

## 2024-03-11 DIAGNOSIS — R7881 Bacteremia: Secondary | ICD-10-CM | POA: Diagnosis not present

## 2024-03-11 HISTORY — PX: CYSTOSCOPY/URETEROSCOPY/HOLMIUM LASER/STENT PLACEMENT: SHX6546

## 2024-03-11 LAB — CBC WITH DIFFERENTIAL/PLATELET
Abs Immature Granulocytes: 0.11 K/uL — ABNORMAL HIGH (ref 0.00–0.07)
Basophils Absolute: 0 K/uL (ref 0.0–0.1)
Basophils Relative: 0 %
Eosinophils Absolute: 0 K/uL (ref 0.0–0.5)
Eosinophils Relative: 0 %
HCT: 30.9 % — ABNORMAL LOW (ref 36.0–46.0)
Hemoglobin: 10 g/dL — ABNORMAL LOW (ref 12.0–15.0)
Immature Granulocytes: 1 %
Lymphocytes Relative: 2 %
Lymphs Abs: 0.3 K/uL — ABNORMAL LOW (ref 0.7–4.0)
MCH: 30.6 pg (ref 26.0–34.0)
MCHC: 32.4 g/dL (ref 30.0–36.0)
MCV: 94.5 fL (ref 80.0–100.0)
Monocytes Absolute: 0.6 K/uL (ref 0.1–1.0)
Monocytes Relative: 4 %
Neutro Abs: 15.1 K/uL — ABNORMAL HIGH (ref 1.7–7.7)
Neutrophils Relative %: 93 %
Platelets: 296 K/uL (ref 150–400)
RBC: 3.27 MIL/uL — ABNORMAL LOW (ref 3.87–5.11)
RDW: 14.2 % (ref 11.5–15.5)
WBC: 16.1 K/uL — ABNORMAL HIGH (ref 4.0–10.5)
nRBC: 0 % (ref 0.0–0.2)

## 2024-03-11 LAB — URINALYSIS, ROUTINE W REFLEX MICROSCOPIC
Bacteria, UA: NONE SEEN
Bilirubin Urine: NEGATIVE
Glucose, UA: NEGATIVE mg/dL
Ketones, ur: NEGATIVE mg/dL
Nitrite: NEGATIVE
Protein, ur: 100 mg/dL — AB
RBC / HPF: >50 RBC/hpf (ref 0–5)
Specific Gravity, Urine: 1.014 (ref 1.005–1.030)
WBC, UA: >50 WBC/hpf (ref 0–5)
pH: 7 (ref 5.0–8.0)

## 2024-03-11 LAB — I-STAT VENOUS BLOOD GAS, ED
Acid-Base Excess: 2 mmol/L (ref 0.0–2.0)
Bicarbonate: 28.1 mmol/L — ABNORMAL HIGH (ref 20.0–28.0)
Calcium, Ion: 1.13 mmol/L — ABNORMAL LOW (ref 1.15–1.40)
HCT: 32 % — ABNORMAL LOW (ref 36.0–46.0)
Hemoglobin: 10.9 g/dL — ABNORMAL LOW (ref 12.0–15.0)
O2 Saturation: 52 %
Potassium: 4 mmol/L (ref 3.5–5.1)
Sodium: 139 mmol/L (ref 135–145)
TCO2: 30 mmol/L (ref 22–32)
pCO2, Ven: 51.9 mmHg (ref 44–60)
pH, Ven: 7.342 (ref 7.25–7.43)
pO2, Ven: 30 mmHg — CL (ref 32–45)

## 2024-03-11 LAB — COMPREHENSIVE METABOLIC PANEL WITH GFR
ALT: 28 U/L (ref 0–44)
AST: 22 U/L (ref 15–41)
Albumin: 2.4 g/dL — ABNORMAL LOW (ref 3.5–5.0)
Alkaline Phosphatase: 49 U/L (ref 38–126)
Anion gap: 8 (ref 5–15)
BUN: 21 mg/dL (ref 8–23)
CO2: 23 mmol/L (ref 22–32)
Calcium: 7.3 mg/dL — ABNORMAL LOW (ref 8.9–10.3)
Chloride: 108 mmol/L (ref 98–111)
Creatinine, Ser: 1.02 mg/dL — ABNORMAL HIGH (ref 0.44–1.00)
GFR, Estimated: 54 mL/min — ABNORMAL LOW (ref >60)
Glucose, Bld: 94 mg/dL (ref 70–99)
Potassium: 3.7 mmol/L (ref 3.5–5.1)
Sodium: 139 mmol/L (ref 135–145)
Total Bilirubin: 0.8 mg/dL (ref 0.0–1.2)
Total Protein: 5.4 g/dL — ABNORMAL LOW (ref 6.5–8.1)

## 2024-03-11 LAB — CBC
HCT: 29.3 % — ABNORMAL LOW (ref 36.0–46.0)
Hemoglobin: 9.7 g/dL — ABNORMAL LOW (ref 12.0–15.0)
MCH: 30.6 pg (ref 26.0–34.0)
MCHC: 33.1 g/dL (ref 30.0–36.0)
MCV: 92.4 fL (ref 80.0–100.0)
Platelets: 266 K/uL (ref 150–400)
RBC: 3.17 MIL/uL — ABNORMAL LOW (ref 3.87–5.11)
RDW: 14.4 % (ref 11.5–15.5)
WBC: 24.9 K/uL — ABNORMAL HIGH (ref 4.0–10.5)
nRBC: 0 % (ref 0.0–0.2)

## 2024-03-11 LAB — TSH: TSH: 1.381 u[IU]/mL (ref 0.350–4.500)

## 2024-03-11 LAB — I-STAT CG4 LACTIC ACID, ED: Lactic Acid, Venous: 1.4 mmol/L (ref 0.5–1.9)

## 2024-03-11 LAB — GLUCOSE, CAPILLARY: Glucose-Capillary: 143 mg/dL — ABNORMAL HIGH (ref 70–99)

## 2024-03-11 LAB — CREATININE, SERUM
Creatinine, Ser: 1.25 mg/dL — ABNORMAL HIGH (ref 0.44–1.00)
GFR, Estimated: 43 mL/min — ABNORMAL LOW (ref >60)

## 2024-03-11 LAB — CBG MONITORING, ED: Glucose-Capillary: 102 mg/dL — ABNORMAL HIGH (ref 70–99)

## 2024-03-11 LAB — CK: Total CK: 86 U/L (ref 38–234)

## 2024-03-11 SURGERY — CYSTOSCOPY/URETEROSCOPY/HOLMIUM LASER/STENT PLACEMENT
Anesthesia: General | Site: Pelvis | Laterality: Left

## 2024-03-11 MED ORDER — LIDOCAINE HCL (PF) 2 % IJ SOLN
INTRAMUSCULAR | Status: AC
  Filled 2024-03-11: qty 5

## 2024-03-11 MED ORDER — FENTANYL CITRATE (PF) 100 MCG/2ML IJ SOLN
INTRAMUSCULAR | Status: AC
  Filled 2024-03-11: qty 2

## 2024-03-11 MED ORDER — ACETAMINOPHEN 650 MG RE SUPP: 650.0000 mg | Freq: Four times a day (QID) | RECTAL | Status: DC | PRN

## 2024-03-11 MED ORDER — FENTANYL CITRATE (PF) 100 MCG/2ML IJ SOLN
INTRAMUSCULAR | Status: DC | PRN
  Administered 2024-03-11 (×6): 25 ug via INTRAVENOUS

## 2024-03-11 MED ORDER — ENOXAPARIN SODIUM 30 MG/0.3ML IJ SOSY
30.0000 mg | PREFILLED_SYRINGE | INTRAMUSCULAR | Status: DC
  Filled 2024-03-11: qty 0.3

## 2024-03-11 MED ORDER — VANCOMYCIN HCL IN DEXTROSE 1-5 GM/200ML-% IV SOLN
1000.0000 mg | Freq: Once | INTRAVENOUS | Status: AC
  Administered 2024-03-11: 1000 mg via INTRAVENOUS
  Filled 2024-03-11: qty 200

## 2024-03-11 MED ORDER — FENTANYL CITRATE PF 50 MCG/ML IJ SOSY: 25.0000 ug | PREFILLED_SYRINGE | INTRAMUSCULAR | Status: DC | PRN

## 2024-03-11 MED ORDER — OXYCODONE HCL 5 MG PO TABS: 5.0000 mg | ORAL_TABLET | Freq: Once | ORAL | Status: DC | PRN

## 2024-03-11 MED ORDER — PROPOFOL 500 MG/50ML IV EMUL
INTRAVENOUS | Status: DC | PRN
  Administered 2024-03-11: 150 ug/kg/min via INTRAVENOUS

## 2024-03-11 MED ORDER — ONDANSETRON HCL 4 MG/2ML IJ SOLN
INTRAMUSCULAR | Status: DC | PRN
  Administered 2024-03-11: 4 mg via INTRAVENOUS

## 2024-03-11 MED ORDER — ENOXAPARIN SODIUM 300 MG/3ML IJ SOLN
20.0000 mg | Freq: Every day | INTRAMUSCULAR | Status: DC
  Administered 2024-03-11: 20 mg via SUBCUTANEOUS
  Filled 2024-03-11 (×3): qty 0.2

## 2024-03-11 MED ORDER — SODIUM CHLORIDE 0.9 % IV BOLUS
1000.0000 mL | Freq: Once | INTRAVENOUS | Status: AC
  Administered 2024-03-11: 1000 mL via INTRAVENOUS

## 2024-03-11 MED ORDER — CHLORHEXIDINE GLUCONATE 0.12 % MT SOLN
15.0000 mL | Freq: Once | OROMUCOSAL | Status: AC
  Administered 2024-03-11: 15 mL via OROMUCOSAL

## 2024-03-11 MED ORDER — ACETAMINOPHEN 10 MG/ML IV SOLN
650.0000 mg | Freq: Once | INTRAVENOUS | Status: AC
  Administered 2024-03-11: 1000 mg via INTRAVENOUS
  Filled 2024-03-11: qty 100

## 2024-03-11 MED ORDER — VANCOMYCIN HCL 750 MG/150ML IV SOLN: 750.0000 mg | INTRAVENOUS | Status: DC

## 2024-03-11 MED ORDER — PIPERACILLIN-TAZOBACTAM 3.375 G IVPB 30 MIN
3.3750 g | Freq: Once | INTRAVENOUS | Status: AC
  Administered 2024-03-11: 3.375 g via INTRAVENOUS
  Filled 2024-03-11: qty 50

## 2024-03-11 MED ORDER — ACETAMINOPHEN 325 MG PO TABS
650.0000 mg | ORAL_TABLET | Freq: Four times a day (QID) | ORAL | Status: DC | PRN
Start: 2024-03-11 — End: 2024-03-20
  Administered 2024-03-12 – 2024-03-20 (×6): 650 mg via ORAL
  Filled 2024-03-11 (×6): qty 2

## 2024-03-11 MED ORDER — PHENYLEPHRINE 80 MCG/ML (10ML) SYRINGE FOR IV PUSH (FOR BLOOD PRESSURE SUPPORT)
PREFILLED_SYRINGE | INTRAVENOUS | Status: AC
  Filled 2024-03-11: qty 10

## 2024-03-11 MED ORDER — ACETAMINOPHEN 10 MG/ML IV SOLN: 1000.0000 mg | Freq: Once | INTRAVENOUS | Status: DC

## 2024-03-11 MED ORDER — PROPOFOL 10 MG/ML IV BOLUS
INTRAVENOUS | Status: DC | PRN
Start: 2024-03-11 — End: 2024-03-11
  Administered 2024-03-11: 80 mg via INTRAVENOUS

## 2024-03-11 MED ORDER — ONDANSETRON HCL 4 MG/2ML IJ SOLN
INTRAMUSCULAR | Status: AC
  Filled 2024-03-11: qty 2

## 2024-03-11 MED ORDER — SODIUM CHLORIDE 0.9 % IR SOLN
Status: DC | PRN
Start: 2024-03-11 — End: 2024-03-11
  Administered 2024-03-11: 6000 mL via INTRAVESICAL

## 2024-03-11 MED ORDER — OXYCODONE HCL 5 MG/5ML PO SOLN: 5.0000 mg | Freq: Once | ORAL | Status: DC | PRN

## 2024-03-11 MED ORDER — PROPOFOL 10 MG/ML IV BOLUS
INTRAVENOUS | Status: AC
  Filled 2024-03-11: qty 20

## 2024-03-11 MED ORDER — DEXTROSE IN LACTATED RINGERS 5 % IV SOLN: INTRAVENOUS | Status: AC

## 2024-03-11 MED ORDER — CEPHALEXIN 250 MG PO CAPS: 250.0000 mg | ORAL_CAPSULE | Freq: Every day | ORAL | 0 refills | Status: DC

## 2024-03-11 MED ORDER — CEFAZOLIN SODIUM-DEXTROSE 2-4 GM/100ML-% IV SOLN
2.0000 g | INTRAVENOUS | Status: AC
  Administered 2024-03-11: 2 g via INTRAVENOUS
  Filled 2024-03-11: qty 100

## 2024-03-11 MED ORDER — ORAL CARE MOUTH RINSE: 15.0000 mL | Freq: Once | OROMUCOSAL | Status: AC

## 2024-03-11 MED ORDER — LACTATED RINGERS IV SOLN: INTRAVENOUS | Status: DC

## 2024-03-11 MED ORDER — DROPERIDOL 2.5 MG/ML IJ SOLN: 0.6250 mg | Freq: Once | INTRAMUSCULAR | Status: DC | PRN

## 2024-03-11 MED ORDER — ACETAMINOPHEN 325 MG PO TABS: 975.0000 mg | ORAL_TABLET | Freq: Once | ORAL | Status: DC

## 2024-03-11 MED ORDER — PIPERACILLIN-TAZOBACTAM 3.375 G IVPB
3.3750 g | Freq: Three times a day (TID) | INTRAVENOUS | Status: DC
  Administered 2024-03-12 (×2): 3.375 g via INTRAVENOUS
  Filled 2024-03-11 (×3): qty 50

## 2024-03-11 MED ORDER — DEXAMETHASONE SODIUM PHOSPHATE 10 MG/ML IJ SOLN
INTRAMUSCULAR | Status: AC
  Filled 2024-03-11: qty 1

## 2024-03-11 MED ORDER — SODIUM CHLORIDE 0.9 % IV SOLN: 1.0000 g | Freq: Once | INTRAVENOUS | Status: DC

## 2024-03-11 MED ORDER — LIDOCAINE HCL (PF) 2 % IJ SOLN
INTRAMUSCULAR | Status: DC | PRN
  Administered 2024-03-11: 60 mg via INTRADERMAL

## 2024-03-11 MED ORDER — IOHEXOL 300 MG/ML  SOLN
INTRAMUSCULAR | Status: DC | PRN
  Administered 2024-03-11: 10 mL via URETHRAL

## 2024-03-11 SURGICAL SUPPLY — 18 items
BAG URO CATCHER STRL LF (MISCELLANEOUS) ×1 IMPLANT
BASKET LASER NITINOL 1.9FR (BASKET) IMPLANT
BASKET ZERO TIP NITINOL 2.4FR (BASKET) IMPLANT
CATH URETL OPEN 5X70 (CATHETERS) ×1 IMPLANT
CLOTH BEACON ORANGE TIMEOUT ST (SAFETY) ×1 IMPLANT
DRSG TEGADERM 2-3/8X2-3/4 SM (GAUZE/BANDAGES/DRESSINGS) IMPLANT
FIBER LASER MOSES 200 DFL (Laser) IMPLANT
GLOVE BIO SURGEON STRL SZ 6.5 (GLOVE) ×1 IMPLANT
GOWN STRL REUS W/ TWL LRG LVL3 (GOWN DISPOSABLE) ×1 IMPLANT
GUIDEWIRE STR DUAL SENSOR (WIRE) ×1 IMPLANT
KIT TURNOVER KIT A (KITS) ×1 IMPLANT
MANIFOLD NEPTUNE II (INSTRUMENTS) ×1 IMPLANT
PACK CYSTO (CUSTOM PROCEDURE TRAY) ×1 IMPLANT
SHEATH NAVIGATOR HD 11/13X28 (SHEATH) IMPLANT
SHEATH NAVIGATOR HD 11/13X36 (SHEATH) IMPLANT
TRACTIP FLEXIVA PULS ID 200XHI (Laser) IMPLANT
TUBING CONNECTING 10 (TUBING) ×1 IMPLANT
TUBING UROLOGY SET (TUBING) ×1 IMPLANT

## 2024-03-11 NOTE — ED Provider Notes (Signed)
 [facility] Provider Note   CSN: [id number] Arrival date & time: 03/11/24  1713     Patient presents with: Altered Mental Status   [name] is a 83 y.o. female with past medical history of tobacco use, COPD, dementia presents to emergency department via EMS for evaluation of AMS.  Underwent L&R ureteral stents placement and lithotripsy today for definitive bilateral stone removal by Dr. Valli Shank.  Per urology note, was discharged today at 1415. Was DC with keflex  but did not take any yet. Husband on scene reports that she needed assistance from hospital into his vehicle and it took multiple people to get her in vehicle. Vehicle had AC on and husband reports it took approximately one hour to get her in vehicle. When he got home he was unable to get her out of the vehicle as she continued to be very lethargic.  He called FD to assist getting her into their home. Reports that patient was without air conditioning in a car for only about 15 minutes until EMS arrived.   Prior to procedure, patient was at baseline and had no complaints of cough, congestion, NVD, nor abd pain. Is incontinent at baseline and wears depends consistently.  Baseline mentation is A&Ox1 alert to self but normally able to perform her own ADLs without assistance  Of note, was admitted for urosepsis 2/2 to 9mm L UVJ obstructive infected stone with L ureteral stent placement on 01/14/24 by Valli Shank.    Altered Mental Status Presenting symptoms: confusion        Prior to Admission medications   Medication Sig Start Date End Date Taking? Authorizing Provider  aspirin  EC 81 MG tablet Take 81 mg by mouth in the morning.   Yes [provider]  bismuth subsalicylate (PEPTO BISMOL) 262 MG/15ML suspension Take 30 mLs by mouth every 6 (six) hours as needed for indigestion or diarrhea or loose stools.   Yes [provider]  BLINK TEARS 0.25 % SOLN  Place 1 drop into both eyes in the morning.   Yes [provider]  Calcium-Vitamin D-Vitamin K 650-12.5-40 MG-MCG-MCG CHEW Chew 1 tablet by mouth in the morning.   Yes [provider]  cyanocobalamin  (VITAMIN B12) 1000 MCG tablet Take 1,000 mcg by mouth in the morning.   Yes [provider]  donepezil  (ARICEPT ) 10 MG tablet Take 10 mg by mouth in the morning. 04/07/23 04/06/24 Yes [provider]  escitalopram  (LEXAPRO ) 20 MG tablet Take 20 mg by mouth in the morning.   Yes [provider]  Ibuprofen  200 MG CAPS Take 200 mg by mouth every 8 (eight) hours as needed (pain/headaches.).   Yes [provider]  loperamide (IMODIUM A-D) 2 MG tablet Take 2 mg by mouth as needed for diarrhea or loose stools.   Yes [provider]  memantine  (NAMENDA ) 5 MG tablet Take 5 mg by mouth in the morning and at bedtime.   Yes [provider]  mirabegron  ER (MYRBETRIQ ) 50 MG TB24 tablet Take 50 mg by mouth in the morning.   Yes [provider]  Multiple Vitamins-Minerals (ONE A DAY WOMEN 50 PLUS PO) Take 1 tablet by mouth in the morning.   Yes [provider]  Multiple Vitamins-Minerals (PRESERVISION AREDS 2) CAPS Take 1 capsule by mouth in the morning and at bedtime.   Yes [provider]  pantoprazole  (PROTONIX ) 40 MG tablet Take 1 tablet (40 mg total) by mouth  daily. Patient taking differently: Take 40 mg by mouth 2 (two) times daily. 01/16/24 03/11/24 Yes [name], MD  Probiotic Product (PROBIOTIC PO) Take 1 capsule by mouth in the morning.   Yes [provider]  cephALEXin  (KEFLEX ) 250 MG capsule Take 1 capsule (250 mg total) by mouth daily. Patient not taking: Reported on 03/11/2024 03/11/24   Elisabeth Valli BIRCH, MD    Allergies: Sulfa antibiotics    Review of Systems  Psychiatric/Behavioral:  Positive for confusion.     Updated Vital Signs BP (!) 100/48 (BP Location: Right Arm)   Pulse 95    Temp 97.7 F (36.5 C) (Oral)   Resp (!) 24   Ht 4' 10 (1.473 m)   Wt 45.9 kg   SpO2 97%   BMI 21.15 kg/m   Physical Exam Vitals and nursing note reviewed.  Constitutional:      General: She is not in acute distress.    Comments: Very fatigued and warm to touch. Will open eyes to verbal stimuli but nonverbal   HENT:     Head: Normocephalic and atraumatic.  Eyes:     Conjunctiva/sclera: Conjunctivae normal.  Cardiovascular:     Rate and Rhythm: Tachycardia present.  Pulmonary:     Effort: Pulmonary effort is normal. No respiratory distress.     Breath sounds: Normal breath sounds.  Skin:    Coloration: Skin is not jaundiced or pale.  Neurological:     GCS: GCS eye subscore is 3. GCS verbal subscore is 2. GCS motor subscore is 5.     Comments: Neuroexam is very limited secondary to current patient condition.  Will open eyes to verbal stimuli but does not follow direction so unable to test motor, and sensation.  Nonverbal     (all labs ordered are listed, but only abnormal results are displayed) Labs Reviewed  CBC WITH DIFFERENTIAL/PLATELET - Abnormal; Notable for the following components:      Result Value   WBC 16.1 (*)    RBC 3.27 (*)    Hemoglobin 10.0 (*)    HCT 30.9 (*)    Neutro Abs 15.1 (*)    Lymphs Abs 0.3 (*)    Abs Immature Granulocytes 0.11 (*)    All other components within normal limits  COMPREHENSIVE METABOLIC PANEL WITH GFR - Abnormal; Notable for the following components:   Creatinine, Ser 1.02 (*)    Calcium 7.3 (*)    Total Protein 5.4 (*)    Albumin 2.4 (*)    GFR, Estimated 54 (*)    All other components within normal limits  URINALYSIS, ROUTINE W REFLEX MICROSCOPIC - Abnormal; Notable for the following components:   APPearance HAZY (*)    Hgb urine dipstick LARGE (*)    Protein, ur 100 (*)    Leukocytes,Ua LARGE (*)    All other components within normal limits  CBC - Abnormal; Notable for the following components:   WBC 24.9 (*)    RBC  3.17 (*)    Hemoglobin 9.7 (*)    HCT 29.3 (*)    All other components within normal limits  CREATININE, SERUM - Abnormal; Notable for the following components:   Creatinine, Ser 1.25 (*)    GFR, Estimated 43 (*)    All other components within normal limits  CBG MONITORING, ED - Abnormal; Notable for the following components:   Glucose-Capillary 102 (*)    All other components within normal limits  I-STAT VENOUS BLOOD GAS, ED -  Abnormal; Notable for the following components:   pO2, Ven 30 (*)    Bicarbonate 28.1 (*)    Calcium, Ion 1.13 (*)    HCT 32.0 (*)    Hemoglobin 10.9 (*)    All other components within normal limits  CULTURE, BLOOD (ROUTINE X 2)  CULTURE, BLOOD (ROUTINE X 2)  CK  TSH  COMPREHENSIVE METABOLIC PANEL WITH GFR  CBC  I-STAT CG4 LACTIC ACID, ED    EKG: EKG Interpretation Date/Time:  Thursday March 11 2024 17:40:14 EDT Ventricular Rate:  99 PR Interval:  127 QRS Duration:  83 QT Interval:  340 QTC Calculation: 437 R Axis:   72  Text Interpretation: Sinus rhythm Ventricular premature complex Aberrant conduction of SV complex(es) Confirmed by Yolande Charleston [phone]) on 03/11/2024 6:37:07 PM  Radiology: DG Chest Portable 1 View Result Date: 03/11/2024 CLINICAL DATA:  Fever. EXAM: PORTABLE CHEST 1 VIEW COMPARISON:  Chest radiograph dated 01/16/2024. FINDINGS: No focal consolidation, pleural effusion or pneumothorax. The cardiac silhouette is within normal limits. Atherosclerotic calcification of the aorta. No acute osseous pathology. IMPRESSION: No active disease. Electronically Signed   By: Vanetta Chou M.D.   On: 03/11/2024 18:06   DG C-Arm 1-60 Min-No Report Result Date: 03/11/2024 Fluoroscopy was utilized by the requesting physician.  No radiographic interpretation.   DG C-Arm 1-60 Min-No Report Result Date: 03/11/2024 Fluoroscopy was utilized by the requesting physician.  No radiographic interpretation.     .Critical Care  Performed by:  Minnie Tinnie BRAVO, PA Authorized by: Minnie Tinnie BRAVO, PA   Critical care provider statement:    Critical care time (minutes):  42   Critical care was necessary to treat or prevent imminent or life-threatening deterioration of the following conditions:  Sepsis   Critical care was time spent personally by me on the following activities:  Discussions with consultants, examination of patient, obtaining history from patient or surrogate, pulse oximetry, re-evaluation of patient's condition, ordering and review of radiographic studies and ordering and review of laboratory studies   Care discussed with: admitting provider      Medications Ordered in the ED  acetaminophen  (TYLENOL ) tablet 650 mg (has no administration in time range)    Or  acetaminophen  (TYLENOL ) suppository 650 mg (has no administration in time range)  dextrose  5 % in lactated ringers  infusion ( Intravenous New Bag/Given 03/11/24 2207)  piperacillin -tazobactam (ZOSYN ) IVPB 3.375 g (has no administration in time range)  vancomycin  (VANCOREADY) IVPB 750 mg/150 mL (has no administration in time range)  enoxaparin  (LOVENOX ) 100 mg/mL injection 20 mg (has no administration in time range)  sodium chloride  0.9 % bolus 1,000 mL (0 mLs Intravenous Stopped 03/11/24 1922)  piperacillin -tazobactam (ZOSYN ) IVPB 3.375 g (0 g Intravenous Stopped 03/11/24 2020)  vancomycin  (VANCOCIN ) IVPB 1000 mg/200 mL premix (0 mg Intravenous Stopped 03/11/24 2135)  acetaminophen  (OFIRMEV ) IV 650 mg (0 mg Intravenous Stopped 03/11/24 2020)                                    Medical Decision Making Amount and/or Complexity of Data Reviewed Labs: ordered. Radiology: ordered.  Risk Prescription drug management. Decision regarding hospitalization.   Patient presents to the ED for concern of altered mentation, fever, this involves an extensive number of treatment options, and is a complaint that carries with it a high risk of complications and morbidity.  The  differential diagnosis includes environmental hyperthermia, sepsis, hypoglycemia, hyperglycemia, thyroid   crisis, rhabdomyolysis, pneumonia   Co morbidities that complicate the patient evaluation  Recent sepsis from infected kidney stone   Additional history obtained:  Additional history obtained from [facility], Nursing, Outside Medical Records, and Past Admission   External records from outside source obtained and reviewed including husband at bedside, triage note, most recent admission from 02/19/2024, op note from stent placement today   Lab Tests:  I Ordered, and personally interpreted labs.  The pertinent results include:   WBC 16.1 Hgb 10 pCO2 WNL Lactic acid 1.4 Creatinine 1.02 CBG 102    Imaging Studies ordered:  I ordered imaging studies including chest x-ray I independently visualized and interpreted imaging which showed no acute cardiopulmonary pathology I agree with the radiologist interpretation   Cardiac Monitoring:  The patient was maintained on a cardiac monitor.  I personally viewed and interpreted the cardiac monitored which showed an underlying rhythm of: NSR at 99 bpm with no ST nor T wave abnormalities   Medicines ordered and prescription drug management:  I ordered medication including Unasyn , vancomycin , Tylenol , IVF for urosepsis, fever, hydration Reevaluation of the patient after these medicines showed that the patient improved I have reviewed the patients home medicines and have made adjustments as needed    Critical Interventions:  sepsis   Consultations Obtained:  I requested consultation with urology Dr. Nieves,  and discussed lab and imaging findings as well as pertinent plan - they recommend:  [facility] medical admission Place foley Will see patient tonight I requested consultation with hospitalist,  and discussed lab and imaging findings as well as pertinent plan - Dr. Franky accepts patient for admission   Problem List / ED  Course:  AMS Does have dementia at baseline.  Normally able to do her own ADLs, A&Ox1 to self Has been able to open eyes to verbal stimuli but otherwise does not follow commands and is nonverbal In setting of fever did also obtain TSH to ensure no thyroid  storm which was WNL Likely secondary to sepsis Sepsis Tachycardia and fever. No lactic acidosis Leukocytosis of 16.1 Did not initially activate sepsis as there was concern regarding environmental hyperthermia according to story and patient being outside in heat for prolonged period however following obtaining lab work and leukocytosis, did quickly initiate IV antibiotics, IVF, Tylenol  Likely 2/2 to urosepsis as she recently underwent lithotripsy, bilateral uretral stent placement today Provided unasyn  and vanc for staph coverage per pharmacy recommendation. Previous urine cultures notable for enterococcus faecalis sensitive to ampicillin  Consulted urology as noted above.  See their note. Chest x-ray negative for infection BC pending   Reevaluation:  After the interventions noted above, I reevaluated the patient and found that they have :improved   Social Determinants of Health:  Current tobacco abuse   Dispostion:  After consideration of the diagnostic results and the patients response to treatment, I feel that the patent would benefit from admission for suspected urosepsis, urology follow-up.   Discussed ED workup, disposition, return to ED precautions with patient who expresses understanding agrees with plan.  All questions answered to their satisfaction.  They are agreeable to plan.  Discharge instructions provided on paperwork  Staffed patient with Dr. Jakie who individually assessed patient, reviewed ED workup.  He agrees with plan.  Final diagnoses:  Altered mental status, unspecified altered mental status type  Sepsis, due to unspecified organism, unspecified whether acute organ dysfunction present [facility])    ED  Discharge Orders     None  Minnie Tinnie BRAVO, PA 03/11/24 2314    Yolande Lamar BROCKS, MD 03/12/24 [phone]

## 2024-03-11 NOTE — ED Triage Notes (Signed)
 Pt is from home. PT had a procedure done at [facility] this morning for Kidney stone, unsure of the procedure per EMS. Per the husband, she was normal with a baseline of alert with baseline dementia. Normally walks and does ADLs. When she came out of the procedure around 3pm, she wasn't able to get into the vehicle on her own. She had to be picked up and placed in the car to go home. About 30 minutes after getting home he was unable to wake her to get her out of the car. EMS got her out of the car at home. For the 30 minutes she was sitting in the car at home the car was off. Pt had vomited Pt was hot to touch when EMS got to her, they placed ice packs on her.Pt will look at you but doesn't respond or follow commands.  140/80 110 Cbg 120 99.8 Temporal  RR 30 O2 86% RA   -    92% on 2L [place]

## 2024-03-11 NOTE — H&P (Signed)
 History and Physical    [name] FMW:[id number] DOB: 18-May-1941 DOA: 03/11/2024  Patient coming from: Home.  Chief Complaint: Decreased response.  HPI: [name] is a 83 y.o. female with history of vascular dementia, prior TIA was brought to the ER after patient was found to be less responsive as noted by patient's husband.  Patient has had prior history of recurrent UTI and was admitted in June 2025 for left-sided hydronephrosis in the setting of sepsis had stent placed at that time.  Patient was taken to the OR today by Dr. Elisabeth urologist and had bilateral ureteroscopy laser lithotripsy basket stone extraction left ureteral stent exchange and right ureteral stent placement.  Following which patient was taken home and was noted to be weak and less responsive and was brought to the ER.  ED Course: In the ER patient was febrile with temperature 103 F hypotensive tachycardic concerning for sepsis.  Patient was confused.  Was given fluid bolus started on empiric antibiotics for sepsis likely from UTI.  Chest x-ray unremarkable UA is concerning for urine infection.  Dr. Nieves urologist was consulted requested placing Foley catheter and continue empiric antibiotics for now.  Labs show creatinine of 1 calcium 7.3 WBC 16.1 hemoglobin 10 platelets 296 lactic acid 1.4.  At the time of my exam patient is more alert but still not back to baseline.  Review of Systems: As per HPI, rest all negative.   Past Medical History:  Diagnosis Date   Anemia    Anxiety    Arthritis    Chronic kidney disease    COPD (chronic obstructive pulmonary disease) (HCC)    Dementia (HCC)    vascular dementia   GERD (gastroesophageal reflux disease)    History of kidney stones    Macular degeneration of left eye    Wet and gets injections q monthly   Smoker    1- 1 1/2 ppd  per Husband   Stroke [facility])    TIAs    Past Surgical History:  Procedure Laterality Date   CHOLECYSTECTOMY  10/24/2010    Laparoscopic   COLECTOMY  07/22/2004   BENIGN TUMOR   CYSTOSCOPY W/ URETERAL STENT PLACEMENT Left 01/14/2024   Procedure: CYSTOSCOPY, WITH RETROGRADE PYELOGRAM AND URETERAL STENT INSERTION;  Surgeon: Elisabeth Valli BIRCH, MD;  Location: [facility] OR;  Service: Urology;  Laterality: Left;   DILATION AND CURETTAGE OF UTERUS     EYE SURGERY Bilateral    cataract extraction   TRANSESOPHAGEAL ECHOCARDIOGRAM (CATH LAB) N/A 10/17/2023   Procedure: TRANSESOPHAGEAL ECHOCARDIOGRAM;  Surgeon: Pietro Redell RAMAN, MD;  Location: [facility] INVASIVE CV LAB;  Service: Cardiovascular;  Laterality: N/A;     reports that she has been smoking cigarettes. She has a 50 pack-year smoking history. She has never used smokeless tobacco. She reports that she does not drink alcohol  and does not use drugs.  Allergies  Allergen Reactions   Sulfa Antibiotics Itching and Rash    Family History  Problem Relation Age of Onset   Cancer Brother    Diabetes Brother    Heart disease Brother    Cancer Sister    Diabetes Sister    Breast cancer Neg Hx     Prior to Admission medications   Medication Sig Start Date End Date Taking? Authorizing Provider  aspirin  EC 81 MG tablet Take 81 mg by mouth daily.    [provider]  BLINK TEARS 0.25 % SOLN Place 1 drop into both eyes in the morning.  [provider]  Calcium-Vitamin D-Vitamin K 650-12.5-40 MG-MCG-MCG CHEW Chew 1 tablet by mouth in the morning.    [provider]  cephALEXin  (KEFLEX ) 250 MG capsule Take 1 capsule (250 mg total) by mouth daily. 03/11/24   Elisabeth Valli BIRCH, MD  cyanocobalamin  (VITAMIN B12) 1000 MCG tablet Take 1,000 mcg by mouth in the morning.    [provider]  donepezil  (ARICEPT ) 10 MG tablet Take 10 mg by mouth in the morning. 04/07/23 04/06/24  [provider]  escitalopram  (LEXAPRO ) 20 MG tablet Take 20 mg by mouth in the morning.    [provider]  Ibuprofen  200 MG CAPS Take 400 mg by mouth every 8  (eight) hours as needed (pain/headaches.).    [provider]  memantine  (NAMENDA ) 5 MG tablet Take 5 mg by mouth in the morning and at bedtime.    [provider]  mirabegron  ER (MYRBETRIQ ) 50 MG TB24 tablet Take 50 mg by mouth in the morning.    [provider]  Multiple Vitamins-Minerals (ONE A DAY WOMEN 50 PLUS PO) Take 1 tablet by mouth in the morning.    [provider]  Multiple Vitamins-Minerals (PRESERVISION AREDS 2) CAPS Take 1 capsule by mouth in the morning and at bedtime.    [provider]  pantoprazole  (PROTONIX ) 40 MG tablet Take 1 tablet (40 mg total) by mouth daily. 01/16/24 03/11/24  [name], MD  Probiotic Product (PROBIOTIC PO) Take 1 capsule by mouth in the morning.    [provider]  TYLENOL  500 MG tablet Take 500 mg by mouth every 6 (six) hours as needed for mild pain (pain score 1-3) or headache.    [provider]    Physical Exam: Constitutional: Moderately built and nourished. Vitals:   03/11/24 1731 03/11/24 1850 03/11/24 2045 03/11/24 2135  BP: (!) 122/100  (!) 100/42   Pulse: (!) 105  99   Resp: (!) 27  (!) 23   Temp: (!) 103.1 F (39.5 C) (!) 101.6 F (38.7 C)  98.2 F (36.8 C)  TempSrc: Rectal Rectal  Axillary  SpO2: 100%  98%   Weight:      Height:       Eyes: Anicteric no pallor. ENMT: No discharge from the ears eyes nose or mouth. Neck: No mass felt.  No neck rigidity.   Respiratory: No rhonchi or crepitations. Cardiovascular: S1-S2 heard. Abdomen: Soft nontender bowel sound present. Musculoskeletal: No edema. Skin: No rash. Neurologic: Patient is lethargic arousable follows commands.  Moving all extremities. Psychiatric: Appears confused.   Labs on Admission: I have personally reviewed following labs and imaging studies  CBC: Recent Labs  Lab 03/09/24 1350 03/11/24 1748 03/11/24 1753  WBC 7.5 16.1*  --   NEUTROABS  --  15.1*  --   HGB 10.4* 10.0* 10.9*  HCT  33.2* 30.9* 32.0*  MCV 94.6 94.5  --   PLT 330 296  --    Basic Metabolic Panel: Recent Labs  Lab 03/09/24 1350 03/11/24 1748 03/11/24 1753  NA 137 139 139  K 3.9 3.7 4.0  CL 102 108  --   CO2 26 23  --   GLUCOSE 106* 94  --   BUN 26* 21  --   CREATININE 0.94 1.02*  --   CALCIUM 9.3 7.3*  --    GFR: Estimated Creatinine Clearance: 26.5 mL/min (A) (by C-G formula based on SCr of 1.02 mg/dL (H)). Liver Function Tests: Recent Labs  Lab 03/11/24 1748  AST 22  ALT 28  ALKPHOS 49  BILITOT 0.8  PROT 5.4*  ALBUMIN 2.4*   No results for input(s): LIPASE, AMYLASE in the last 168 hours. No results for input(s): AMMONIA in the last 168 hours. Coagulation Profile: No results for input(s): INR, PROTIME in the last 168 hours. Cardiac Enzymes: Recent Labs  Lab 03/11/24 1748  CKTOTAL 86   BNP (last 3 results) No results for input(s): PROBNP in the last 8760 hours. HbA1C: No results for input(s): HGBA1C in the last 72 hours. CBG: Recent Labs  Lab 03/11/24 1739  GLUCAP 102*   Lipid Profile: No results for input(s): CHOL, HDL, LDLCALC, TRIG, CHOLHDL, LDLDIRECT in the last 72 hours. Thyroid  Function Tests: Recent Labs    03/11/24 1748  TSH 1.381   Anemia Panel: No results for input(s): VITAMINB12, FOLATE, FERRITIN, TIBC, IRON, RETICCTPCT in the last 72 hours. Urine analysis:    Component Value Date/Time   COLORURINE YELLOW 01/13/2024 0400   APPEARANCEUR CLOUDY (A) 01/13/2024 0400   LABSPEC 1.019 01/13/2024 0400   PHURINE 5.0 01/13/2024 0400   GLUCOSEU NEGATIVE 01/13/2024 0400   HGBUR MODERATE (A) 01/13/2024 0400   BILIRUBINUR NEGATIVE 01/13/2024 0400   BILIRUBINUR negative 06/20/2023 1231   KETONESUR NEGATIVE 01/13/2024 0400   PROTEINUR 100 (A) 01/13/2024 0400   UROBILINOGEN 0.2 06/20/2023 1231   UROBILINOGEN 0.2 10/21/2010 2043   NITRITE NEGATIVE 01/13/2024 0400   LEUKOCYTESUR LARGE (A) 01/13/2024 0400   Sepsis  Labs: @LABRCNTIP (procalcitonin:4,lacticidven:4) )No results found for this or any previous visit (from the past 240 hours).   Radiological Exams on Admission: DG Chest Portable 1 View Result Date: 03/11/2024 CLINICAL DATA:  Fever. EXAM: PORTABLE CHEST 1 VIEW COMPARISON:  Chest radiograph dated 01/16/2024. FINDINGS: No focal consolidation, pleural effusion or pneumothorax. The cardiac silhouette is within normal limits. Atherosclerotic calcification of the aorta. No acute osseous pathology. IMPRESSION: No active disease. Electronically Signed   By: Vanetta Chou M.D.   On: 03/11/2024 18:06   DG C-Arm 1-60 Min-No Report Result Date: 03/11/2024 Fluoroscopy was utilized by the requesting physician.  No radiographic interpretation.   DG C-Arm 1-60 Min-No Report Result Date: 03/11/2024 Fluoroscopy was utilized by the requesting physician.  No radiographic interpretation.    EKG: Independently reviewed.  Normal sinus rhythm.  Assessment/Plan Principal Problem:   Sepsis (HCC) Active Problems:   COPD (chronic obstructive pulmonary disease) (HCC)   Memory loss   UTI (urinary tract infection)   Acute encephalopathy   ARF (acute renal failure) (HCC)    Sepsis secondary to UTI with bilateral ureteral stent placement this morning.  Appreciate urology consult and recommendation.  Foley catheter placed.  Patient on empiric antibiotics continue hydration.  Follow cultures. Acute encephalopathy secondary to sepsis.  Slowly improving.  Continue to monitor closely. Acute renal failure creatinine mildly increased from baseline when compared to 2 days ago.  Continue hydration and follow metabolic panel. History of dementia takes Namenda  and Aricept .  Presently NPO. Anemia appears to be chronic. Prior history of TIA takes aspirin . COPD mentioned the chart presently not wheezing.  Since patient has sepsis will need close monitoring further workup and more than 2 midnight stay.   DVT prophylaxis:  Heparin . Code Status: Full code. Family Communication: Unable to reach patient's husband. Disposition Plan: Progressive care. Consults called: Urologist. Admission status: Inpatient.

## 2024-03-11 NOTE — Transfer of Care (Signed)
 Immediate Anesthesia Transfer of Care Note  Patient: [name]  Procedure(s) Performed: CYSTOSCOPY/URETEROSCOPY/HOLMIUM LASER/STENT PLACEMENT (Left: Pelvis)  Patient Location: PACU  Anesthesia Type:General  Level of Consciousness: drowsy  Airway & Oxygen Therapy: Patient Spontanous Breathing and Patient connected to face mask oxygen  Post-op Assessment: Report given to RN and Post -op Vital signs reviewed and stable  Post vital signs: Reviewed and stable  Last Vitals:  Vitals Value Taken Time  BP 129/75 03/11/24 12:23  Temp    Pulse 82 03/11/24 12:22  Resp 18 03/11/24 12:22  SpO2 98 % 03/11/24 12:22  Vitals shown include unfiled device data.  Last Pain:  Vitals:   03/11/24 0925  TempSrc: Oral  PainSc: 0-No pain         Complications: No notable events documented.

## 2024-03-11 NOTE — ED Notes (Signed)
 CCMD called by this NT.

## 2024-03-11 NOTE — Progress Notes (Signed)
 Pharmacy Antibiotic Note  [name] is a 83 y.o. female admitted on 03/11/2024 with sepsis.  Pharmacy has been consulted for Vancomycin  and Zosyn  dosing. Urine cx (12/2023) grew enterococcus faecalis. Received dose of Vanc and Zosyn  in ED.  Plan: Zosyn  3.375gm IV q8h Vancomycin  750 mg IV Q 48 hrs. Goal AUC 400-550. Expected AUC: 460 SCr used: 1.02 Will f/u renal function, micro data, and pt's clinical condition Vanc levels prn   Height: 4' 10 (147.3 cm) Weight: 42.6 kg (94 lb) IBW/kg (Calculated) : 40.9  Temp (24hrs), Avg:98.9 F (37.2 C), Min:96.3 F (35.7 C), Max:103.1 F (39.5 C)  Recent Labs  Lab 03/09/24 1350 03/11/24 1748 03/11/24 1754  WBC 7.5 16.1*  --   CREATININE 0.94 1.02*  --   LATICACIDVEN  --   --  1.4    Estimated Creatinine Clearance: 26.5 mL/min (A) (by C-G formula based on SCr of 1.02 mg/dL (H)).    Allergies  Allergen Reactions   Sulfa Antibiotics Itching and Rash    Antimicrobials this admission: 8/21 Vanc >>  8/21 Zosyn  >>   Microbiology results: 8/21 BCx:   Thank you for allowing pharmacy to be a part of this patient's care.  Vito Ralph, PharmD, BCPS Please see amion for complete clinical pharmacist phone list 03/11/2024 9:49 PM

## 2024-03-11 NOTE — Anesthesia Postprocedure Evaluation (Signed)
 Anesthesia Post Note  Patient: [name]  Procedure(s) Performed: CYSTOSCOPY/URETEROSCOPY/HOLMIUM LASER/STENT PLACEMENT (Left: Pelvis)     Patient location during evaluation: PACU Anesthesia Type: General Level of consciousness: awake and alert Pain management: pain level controlled Vital Signs Assessment: post-procedure vital signs reviewed and stable Respiratory status: spontaneous breathing, nonlabored ventilation, respiratory function stable and patient connected to nasal cannula oxygen Cardiovascular status: blood pressure returned to baseline and stable Postop Assessment: no apparent nausea or vomiting Anesthetic complications: no   No notable events documented.  Last Vitals:  Vitals:   03/11/24 1245 03/11/24 1300  BP: (!) 135/55 (!) 126/56  Pulse: 72 72  Resp: 15 18  Temp:  36.6 C  SpO2: 92% 96%    Last Pain:  Vitals:   03/11/24 1300  TempSrc:   PainSc: 0-No pain                 Thom JONELLE Peoples

## 2024-03-11 NOTE — Discharge Instructions (Signed)
 DISCHARGE INSTRUCTIONS FOR KIDNEY STONE/URETERAL STENT   MEDICATIONS:  1. Resume all your other meds from home  2. AZO over the counter can help with the burning/stinging when you urinate. 3. Take Cephalexin  daily while the stents are in place   ACTIVITY:  1. No strenuous activity x 1week  2. No driving while on narcotic pain medications  3. Drink plenty of water  4. Continue to walk at home - you can still get blood clots when you are at home, so keep active, but don't over do it.  5. May return to work/school tomorrow or when you feel ready   BATHING:  1. You can shower and we recommend daily showers  2. You have a string coming from your urethra: The stent string is attached to your ureteral stent. Do not pull on this.   SIGNS/SYMPTOMS TO CALL:  Please call us  if you have a fever greater than 101.5, uncontrolled nausea/vomiting, uncontrolled pain, dizziness, unable to urinate, bloody urine, chest pain, shortness of breath, leg swelling, leg pain, redness around wound, drainage from wound, or any other concerns or questions.   You can reach us  at [phone].   FOLLOW-UP:  1. You have strings attached to your stents, you may remove it on Monday, August 25th. To do this, pull the strings until the stents are completely removed. You may feel an odd sensation in your back.

## 2024-03-11 NOTE — Progress Notes (Signed)
 Upon arrival to unit patient remained on nasal cannula 2L with an SPO2 WNL, see flowsheets. Patient is alert and oriented to her full name and DOB. Patient has foley in place and remains on continuous telemetry. No family available at this time.   Safety measures are in place, call light is within reach, and 4P's addressed.

## 2024-03-11 NOTE — Consult Note (Addendum)
 Consultation: Sepsis following ureteroscopy Requested by: Dr. Lamar Shan   History of Present Illness: [name] is an 83 yo female with dementia who developed severe weakness and lethargy following bilateral ureteroscopy, laser lithotripsy, basket stone extraction, left 31F x 22cm ureteral stent exchange, right 31Fr x 22cm ureteral stent placement today, 03/11/2024, with Dr. Elisabeth for left ureteral calculus and right renal calculus.  White count 16, creatinine 1.02. Her Urine cx from Jun 2025 with enterococcus when she was urgently stented for the left ureteral calculus.  UA pending.  She has been resuscitated and received vancomycin  and Zosyn .  Temp looks better at 97.7, pulse in the 95, 100/48.  97% on nasal cannula.  She was seen and examined in her room on 6 E. with nurses as they performed their admission assessment.  Past Medical History:  Diagnosis Date   Anemia    Anxiety    Arthritis    Chronic kidney disease    COPD (chronic obstructive pulmonary disease) (HCC)    Dementia (HCC)    vascular dementia   GERD (gastroesophageal reflux disease)    History of kidney stones    Macular degeneration of left eye    Wet and gets injections q monthly   Smoker    1- 1 1/2 ppd  per Husband   Stroke [facility])    TIAs   Past Surgical History:  Procedure Laterality Date   CHOLECYSTECTOMY  10/24/2010   Laparoscopic   COLECTOMY  07/22/2004   BENIGN TUMOR   CYSTOSCOPY W/ URETERAL STENT PLACEMENT Left 01/14/2024   Procedure: CYSTOSCOPY, WITH RETROGRADE PYELOGRAM AND URETERAL STENT INSERTION;  Surgeon: Elisabeth Valli BIRCH, MD;  Location: [facility] OR;  Service: Urology;  Laterality: Left;   DILATION AND CURETTAGE OF UTERUS     EYE SURGERY Bilateral    cataract extraction   TRANSESOPHAGEAL ECHOCARDIOGRAM (CATH LAB) N/A 10/17/2023   Procedure: TRANSESOPHAGEAL ECHOCARDIOGRAM;  Surgeon: Pietro Redell RAMAN, MD;  [facility] INVASIVE CV LAB;  Service: Cardiovascular;  Laterality: N/A;    Home Medications:   Medications Prior to Admission  Medication Sig Dispense Refill Last Dose/Taking   BLINK TEARS 0.25 % SOLN Place 1 drop into both eyes in the morning.   03/10/2024   aspirin  EC 81 MG tablet Take 81 mg by mouth daily.      Calcium-Vitamin D-Vitamin K 650-12.5-40 MG-MCG-MCG CHEW Chew 1 tablet by mouth in the morning.      cephALEXin  (KEFLEX ) 250 MG capsule Take 1 capsule (250 mg total) by mouth daily. 7 capsule 0    cyanocobalamin  (VITAMIN B12) 1000 MCG tablet Take 1,000 mcg by mouth in the morning.      donepezil  (ARICEPT ) 10 MG tablet Take 10 mg by mouth in the morning.      escitalopram  (LEXAPRO ) 20 MG tablet Take 20 mg by mouth in the morning.      Ibuprofen  200 MG CAPS Take 200 mg by mouth every 8 (eight) hours as needed (pain/headaches.).   Unknown   memantine  (NAMENDA ) 5 MG tablet Take 5 mg by mouth in the morning and at bedtime.      mirabegron  ER (MYRBETRIQ ) 50 MG TB24 tablet Take 50 mg by mouth in the morning.      Multiple Vitamins-Minerals (ONE A DAY WOMEN 50 PLUS PO) Take 1 tablet by mouth in the morning.      Multiple Vitamins-Minerals (PRESERVISION AREDS 2) CAPS Take 1 capsule by mouth in the morning and at bedtime.      pantoprazole  (PROTONIX )  40 MG tablet Take 1 tablet (40 mg total) by mouth daily. 30 tablet 0    Probiotic Product (PROBIOTIC PO) Take 1 capsule by mouth in the morning.      Allergies:  Allergies  Allergen Reactions   Sulfa Antibiotics Itching and Rash    Family History  Problem Relation Age of Onset   Cancer Brother    Diabetes Brother    Heart disease Brother    Cancer Sister    Diabetes Sister    Breast cancer Neg Hx    Social History:  reports that she has been smoking cigarettes. She has a 50 pack-year smoking history. She has never used smokeless tobacco. She reports that she does not drink alcohol  and does not use drugs.  ROS: A complete review of systems was performed.  All systems are negative except for pertinent findings as noted. Review of  Systems  Unable to perform ROS: Mental status change     Physical Exam:  Vital signs in last 24 hours: Temp:  [96.3 F (35.7 C)-103.1 F (39.5 C)] 98.2 F (36.8 C) (08/21 2135) Pulse Rate:  [72-105] 99 (08/21 2045) Resp:  [14-27] 23 (08/21 2045) BP: (100-135)/(42-100) 100/42 (08/21 2045) SpO2:  [92 %-100 %] 98 % (08/21 2045) Weight:  [42.6 kg] 42.6 kg (08/21 1726) General:  No acute distress, responds to verbal stimuli HEENT: Normocephalic, atraumatic Cardiovascular: Regular rate and rhythm Lungs: Regular rate and effort Abdomen: Soft, nontender, nondistended, no abdominal masses Back: No CVA tenderness Extremities: No edema Neurologic: Grossly intact GU: Foley catheter in place with stent strings emanating and tucked in the vagina.  Urine clear.  Laboratory Data:  Results for orders placed or performed during the hospital encounter of 03/11/24 (from the past 24 hours)  CBG monitoring, ED     Status: Abnormal   Collection Time: 03/11/24  5:39 PM  Result Value Ref Range   Glucose-Capillary 102 (H) 70 - 99 mg/dL   Comment 1 Notify RN    Comment 2 Document in Chart   CBC with Differential     Status: Abnormal   Collection Time: 03/11/24  5:48 PM  Result Value Ref Range   WBC 16.1 (H) 4.0 - 10.5 K/uL   RBC 3.27 (L) 3.87 - 5.11 MIL/uL   Hemoglobin 10.0 (L) 12.0 - 15.0 g/dL   HCT 69.0 (L) 63.9 - 53.9 %   MCV 94.5 80.0 - 100.0 fL   MCH 30.6 26.0 - 34.0 pg   MCHC 32.4 30.0 - 36.0 g/dL   RDW 85.7 88.4 - 84.4 %   Platelets 296 150 - 400 K/uL   nRBC 0.0 0.0 - 0.2 %   Neutrophils Relative % 93 %   Neutro Abs 15.1 (H) 1.7 - 7.7 K/uL   Lymphocytes Relative 2 %   Lymphs Abs 0.3 (L) 0.7 - 4.0 K/uL   Monocytes Relative 4 %   Monocytes Absolute 0.6 0.1 - 1.0 K/uL   Eosinophils Relative 0 %   Eosinophils Absolute 0.0 0.0 - 0.5 K/uL   Basophils Relative 0 %   Basophils Absolute 0.0 0.0 - 0.1 K/uL   Immature Granulocytes 1 %   Abs Immature Granulocytes 0.11 (H) 0.00 - 0.07 K/uL   Comprehensive metabolic panel     Status: Abnormal   Collection Time: 03/11/24  5:48 PM  Result Value Ref Range   Sodium 139 135 - 145 mmol/L   Potassium 3.7 3.5 - 5.1 mmol/L   Chloride 108 98 - 111 mmol/L  CO2 23 22 - 32 mmol/L   Glucose, Bld 94 70 - 99 mg/dL   BUN 21 8 - 23 mg/dL   Creatinine, Ser 8.97 (H) 0.44 - 1.00 mg/dL   Calcium 7.3 (L) 8.9 - 10.3 mg/dL   Total Protein 5.4 (L) 6.5 - 8.1 g/dL   Albumin 2.4 (L) 3.5 - 5.0 g/dL   AST 22 15 - 41 U/L   ALT 28 0 - 44 U/L   Alkaline Phosphatase 49 38 - 126 U/L   Total Bilirubin 0.8 0.0 - 1.2 mg/dL   GFR, Estimated 54 (L) >60 mL/min   Anion gap 8 5 - 15  CK     Status: None   Collection Time: 03/11/24  5:48 PM  Result Value Ref Range   Total CK 86 38 - 234 U/L  TSH     Status: None   Collection Time: 03/11/24  5:48 PM  Result Value Ref Range   TSH 1.381 0.350 - 4.500 uIU/mL  I-Stat venous blood gas, ([facility] ED, [facility], [facility])     Status: Abnormal   Collection Time: 03/11/24  5:53 PM  Result Value Ref Range   pH, Ven 7.342 7.25 - 7.43   pCO2, Ven 51.9 44 - 60 mmHg   pO2, Ven 30 (LL) 32 - 45 mmHg   Bicarbonate 28.1 (H) 20.0 - 28.0 mmol/L   TCO2 30 22 - 32 mmol/L   O2 Saturation 52 %   Acid-Base Excess 2.0 0.0 - 2.0 mmol/L   Sodium 139 135 - 145 mmol/L   Potassium 4.0 3.5 - 5.1 mmol/L   Calcium, Ion 1.13 (L) 1.15 - 1.40 mmol/L   HCT 32.0 (L) 36.0 - 46.0 %   Hemoglobin 10.9 (L) 12.0 - 15.0 g/dL   Sample type VENOUS    Comment NOTIFIED PHYSICIAN   I-Stat CG4 Lactic Acid, ED ([facility] and [facility] ONLY)     Status: None   Collection Time: 03/11/24  5:54 PM  Result Value Ref Range   Lactic Acid, Venous 1.4 0.5 - 1.9 mmol/L   No results found for this or any previous visit (from the past 240 hours). Creatinine: Recent Labs    03/09/24 1350 03/11/24 1748  CREATININE 0.94 1.02*    Impression/Assessment/plan:  83 year old with sepsis after bilateral ureteroscopy-  continue broad-spectrum antibiotics to cover for Enterococcus and follow  new cultures. Foley catheter in place to max drain the system as she has ureteral stents in place (prevent reflux), but also to prevent premature stent removal.  Once she is stable, the Foley can simply be removed and the strings should stay in place.  I will notify Dr. Elisabeth of admission.  [name] 03/11/2024, 9:58 PM

## 2024-03-11 NOTE — H&P (Signed)
 Cc: urolithiasis  History of present illness: 83 yo woman with history of vascular dementia and recent hospital admission for Enterococcus bacteremia admitted for acute encephalopathy.  Patient was febrile, hypotensive and creatinine was elevated at 1.35.  CT of the abdomen pelvis shows a left 9 mm UPJ calculus.  She underwent emergent left ureteral stent placement on 01/14/2024.  She is initially scheduled for surgery 2 weeks ago however she ate prior to surgery and it was canceled.  She now returns for definitive management of left ureteral calculus.  As she has comorbidities as well as right renal calculi we decided to add bilateral to the procedure.   Review of systems: Patient is sleepy and minimally responsive this morning  Patient Active Problem List   Diagnosis Date Noted   Sepsis due to gram-negative urinary tract infection (HCC) 01/13/2024   Bacteremia 10/17/2023   UTI (urinary tract infection) 10/14/2023   Cerebrovascular disease 02/04/2023   Moderate vascular dementia without behavioral disturbance, psychotic disturbance, mood disturbance, or anxiety (HCC) 02/04/2023   Memory loss 12/30/2022   Unsteady gait 12/30/2022   Atopic dermatitis 02/12/2021   Gastro-esophageal reflux disease without esophagitis 02/12/2021   Gingivitis 02/12/2021   Impairment of balance 02/12/2021   Irritable bowel syndrome with diarrhea 02/12/2021   Major depressive disorder 02/12/2021   Plantar wart of right foot 02/12/2021   Urinary hesitancy 02/12/2021   Chondrodermatitis nodularis helicis of left ear 12/06/2020   Allergic urticaria 01/25/2020   Perennial allergic rhinitis 01/25/2020   Perennial allergic rhinitis 01/25/2020   Hematuria 02/22/2019   Degenerative disc disease, lumbar 04/17/2018   Degenerative scoliosis in adult patient 04/17/2018   SI joint arthritis (HCC) 04/17/2018   Degenerative disc disease, cervical 09/09/2017   Spondylolisthesis of cervical region 09/09/2017   Chronic  swimmer's ear of both sides 06/03/2017   Tobacco use 05/06/2017   COPD (chronic obstructive pulmonary disease) (HCC) 05/06/2017   History of renal calculi 01/14/2017   Anxiety 09/05/2016   Renal stone 09/05/2016   Hoarseness 06/20/2016   Laryngopharyngeal reflux (LPR) 06/20/2016    No current facility-administered medications on file prior to encounter.   Current Outpatient Medications on File Prior to Encounter  Medication Sig Dispense Refill   BLINK TEARS 0.25 % SOLN Place 1 drop into both eyes in the morning.     donepezil  (ARICEPT ) 10 MG tablet Take 10 mg by mouth in the morning.     escitalopram  (LEXAPRO ) 20 MG tablet Take 20 mg by mouth in the morning.     Ibuprofen  200 MG CAPS Take 400 mg by mouth every 8 (eight) hours as needed (pain/headaches.).     memantine  (NAMENDA ) 5 MG tablet Take 5 mg by mouth in the morning and at bedtime.     mirabegron  ER (MYRBETRIQ ) 50 MG TB24 tablet Take 50 mg by mouth in the morning.     pantoprazole  (PROTONIX ) 40 MG tablet Take 1 tablet (40 mg total) by mouth daily. 30 tablet 0   TYLENOL  500 MG tablet Take 500 mg by mouth every 6 (six) hours as needed for mild pain (pain score 1-3) or headache.     aspirin  EC 81 MG tablet Take 81 mg by mouth daily.     Calcium-Vitamin D-Vitamin K 650-12.5-40 MG-MCG-MCG CHEW Chew 1 tablet by mouth in the morning.     cyanocobalamin  (VITAMIN B12) 1000 MCG tablet Take 1,000 mcg by mouth in the morning.     Multiple Vitamins-Minerals (ONE A DAY WOMEN 50 PLUS PO) Take  1 tablet by mouth in the morning.     Multiple Vitamins-Minerals (PRESERVISION AREDS 2) CAPS Take 1 capsule by mouth in the morning and at bedtime.     Probiotic Product (PROBIOTIC PO) Take 1 capsule by mouth in the morning.      Past Medical History:  Diagnosis Date   Anemia    Anxiety    Arthritis    Chronic kidney disease    COPD (chronic obstructive pulmonary disease) (HCC)    Dementia (HCC)    vascular dementia   GERD (gastroesophageal reflux  disease)    History of kidney stones    Macular degeneration of left eye    Wet and gets injections q monthly   Smoker    1- 1 1/2 ppd  per Husband   Stroke [facility])    TIAs    Past Surgical History:  Procedure Laterality Date   CHOLECYSTECTOMY  10/24/2010   Laparoscopic   COLECTOMY  07/22/2004   BENIGN TUMOR   CYSTOSCOPY W/ URETERAL STENT PLACEMENT Left 01/14/2024   Procedure: CYSTOSCOPY, WITH RETROGRADE PYELOGRAM AND URETERAL STENT INSERTION;  Surgeon: Elisabeth Valli BIRCH, MD;  Location: [facility] OR;  Service: Urology;  Laterality: Left;   DILATION AND CURETTAGE OF UTERUS     EYE SURGERY Bilateral    cataract extraction   TRANSESOPHAGEAL ECHOCARDIOGRAM (CATH LAB) N/A 10/17/2023   Procedure: TRANSESOPHAGEAL ECHOCARDIOGRAM;  Surgeon: Pietro Redell RAMAN, MD;  Location: [facility] INVASIVE CV LAB;  Service: Cardiovascular;  Laterality: N/A;    Social History   Tobacco Use   Smoking status: Every Day    Current packs/day: 1.00    Average packs/day: 1 pack/day for 50.0 years (50.0 ttl pk-yrs)    Types: Cigarettes   Smokeless tobacco: Never  Vaping Use   Vaping status: Never Used  Substance Use Topics   Alcohol  use: No   Drug use: No    Family History  Problem Relation Age of Onset   Cancer Brother    Diabetes Brother    Heart disease Brother    Cancer Sister    Diabetes Sister    Breast cancer Neg Hx     PE: Vitals:   03/11/24 0925  BP: 130/69  Pulse: 77  Resp: 16  Temp: 98.5 F (36.9 C)  TempSrc: Oral  Weight: 42.6 kg  Height: 4' 10 (1.473 m)   Patient appears to be in no acute distress  Patient is lethargic Atraumatic normocephalic head No cervical or supraclavicular lymphadenopathy appreciated No increased work of breathing, no audible wheezes/rhonchi Regular sinus rhythm/rate Abdomen is soft, nontender, nondistended, no CVA or suprapubic tenderness Lower extremities are symmetric without appreciable edema No identifiable skin lesions  Recent Labs     03/09/24 1350  WBC 7.5  HGB 10.4*  HCT 33.2*   Recent Labs    03/09/24 1350  NA 137  K 3.9  CL 102  CO2 26  GLUCOSE 106*  BUN 26*  CREATININE 0.94  CALCIUM 9.3   No results for input(s): LABPT, INR in the last 72 hours. No results for input(s): LABURIN in the last 72 hours. Results for orders placed or performed during the hospital encounter of 01/13/24  Resp panel by RT-PCR (RSV, Flu A&B, Covid) Anterior Nasal Swab     Status: None   Collection Time: 01/13/24  2:20 AM   Specimen: Anterior Nasal Swab  Result Value Ref Range Status   SARS Coronavirus 2 by RT PCR NEGATIVE NEGATIVE Final   Influenza A by PCR  NEGATIVE NEGATIVE Final   Influenza B by PCR NEGATIVE NEGATIVE Final    Comment: (NOTE) The Xpert Xpress SARS-CoV-2/FLU/RSV plus assay is intended as an aid in the diagnosis of influenza from Nasopharyngeal swab specimens and should not be used as a sole basis for treatment. Nasal washings and aspirates are unacceptable for Xpert Xpress SARS-CoV-2/FLU/RSV testing.  Fact Sheet for Patients: [name]  Fact Sheet for Healthcare Providers: [name]  This test is not yet approved or cleared by the [place]  FDA and has been authorized for detection and/or diagnosis of SARS-CoV-2 by FDA under an Emergency Use Authorization (EUA). This EUA will remain in effect (meaning this test can be used) for the duration of the COVID-19 declaration under Section 564(b)(1) of the Act, 21 U.S.C. section 360bbb-3(b)(1), unless the authorization is terminated or revoked.     Resp Syncytial Virus by PCR NEGATIVE NEGATIVE Final    Comment: (NOTE) Fact Sheet for Patients: [name]  Fact Sheet for Healthcare Providers: [name]  This test is not yet approved or cleared by the [place]  FDA and has been authorized for detection and/or  diagnosis of SARS-CoV-2 by FDA under an Emergency Use Authorization (EUA). This EUA will remain in effect (meaning this test can be used) for the duration of the COVID-19 declaration under Section 564(b)(1) of the Act, 21 U.S.C. section 360bbb-3(b)(1), unless the authorization is terminated or revoked.  Performed at [facility], [address], [place], [place] [address]   Blood Culture (routine x 2)     Status: None   Collection Time: 01/13/24  2:42 AM   Specimen: BLOOD  Result Value Ref Range Status   Specimen Description BLOOD LEFT ANTECUBITAL  Final   Special Requests   Final    BOTTLES DRAWN AEROBIC AND ANAEROBIC Blood Culture results may not be optimal due to an inadequate volume of blood received in culture bottles   Culture   Final    NO GROWTH 5 DAYS Performed at [facility], [address] [place], [place] [address]    Report Status 01/18/2024 FINAL  Final  Blood Culture (routine x 2)     Status: None   Collection Time: 01/13/24  2:42 AM   Specimen: BLOOD LEFT FOREARM  Result Value Ref Range Status   Specimen Description BLOOD LEFT FOREARM  Final   Special Requests   Final    BOTTLES DRAWN AEROBIC AND ANAEROBIC Blood Culture results may not be optimal due to an inadequate volume of blood received in culture bottles   Culture   Final    NO GROWTH 5 DAYS Performed at [facility], [address], [place], [place] [address]    Report Status 01/18/2024 FINAL  Final  Urine Culture     Status: Abnormal   Collection Time: 01/13/24  4:00 AM   Specimen: Urine, Random  Result Value Ref Range Status   Specimen Description URINE, RANDOM  Final   Special Requests   Final    NONE Reflexed from [id number] Performed at [facility], [address], [place], [place] [address]    Culture >=100,000 COLONIES/mL ENTEROCOCCUS FAECALIS (A)  Final   Report Status 01/15/2024 FINAL  Final   Organism ID, Bacteria ENTEROCOCCUS FAECALIS (A)  Final      Susceptibility    Enterococcus faecalis - MIC*    AMPICILLIN  <=2 SENSITIVE Sensitive     NITROFURANTOIN <=16 SENSITIVE Sensitive     VANCOMYCIN  1 SENSITIVE Sensitive     * >=  100,000 COLONIES/mL ENTEROCOCCUS FAECALIS  Calprotectin, Fecal     Status: None   Collection Time: 01/15/24 10:55 AM   Specimen: Stool  Result Value Ref Range Status   Calprotectin, Fecal 13 0 - 120 ug/g Final    Comment: (NOTE) Concentration     Interpretation   Follow-Up < 5 - 50 ug/g     Normal           None >50 -120 ug/g     Borderline       Re-evaluate in 4-6 weeks    >120 ug/g     Abnormal         Repeat as clinically                                   indicated Performed At: [facility] [address] [place], [place] [id number] Jennette Shorter MD Ey:[id number]   Gastrointestinal Panel by PCR , Stool     Status: None   Collection Time: 01/15/24 10:55 AM   Specimen: Stool  Result Value Ref Range Status   Campylobacter species NOT DETECTED NOT DETECTED Final   Plesimonas shigelloides NOT DETECTED NOT DETECTED Final   Salmonella species NOT DETECTED NOT DETECTED Final   Yersinia enterocolitica NOT DETECTED NOT DETECTED Final   Vibrio species NOT DETECTED NOT DETECTED Final   Vibrio cholerae NOT DETECTED NOT DETECTED Final   Enteroaggregative E coli (EAEC) NOT DETECTED NOT DETECTED Final   Enteropathogenic E coli (EPEC) NOT DETECTED NOT DETECTED Final   Enterotoxigenic E coli (ETEC) NOT DETECTED NOT DETECTED Final   Shiga like toxin producing E coli (STEC) NOT DETECTED NOT DETECTED Final   Shigella/Enteroinvasive E coli (EIEC) NOT DETECTED NOT DETECTED Final   Cryptosporidium NOT DETECTED NOT DETECTED Final   Cyclospora cayetanensis NOT DETECTED NOT DETECTED Final   Entamoeba histolytica NOT DETECTED NOT DETECTED Final   Giardia lamblia NOT DETECTED NOT DETECTED Final   Adenovirus F40/41 NOT DETECTED NOT DETECTED Final   Astrovirus NOT DETECTED NOT DETECTED Final   Norovirus GI/GII NOT DETECTED NOT DETECTED  Final   Rotavirus A NOT DETECTED NOT DETECTED Final   Sapovirus (I, II, IV, and V) NOT DETECTED NOT DETECTED Final    Comment: Performed at [facility], [address] [place], [place] [address]    Imaging: CT Abd/Pelvis IMPRESSION: 1. Moderate left-sided hydroureteronephrosis due to an obstructive calculus at the left UVJ, measuring 9 x 8 x 9 mm. Correlation with urinalysis recommended to exclude superimposed infection. 2. Nonobstructive calculi in the right kidney, including a 6 mm renal pelvis calculus. No hydronephrosis. 3. Small amount of layering fluid in the distal esophagus, possibly due to incomplete emptying or gastroesophageal reflux disease. 4. Bilateral pleural effusions, moderate in volume on the right and small to moderate on the left. Anasarca and small volume free fluid in the pelvis, all likely related to patient's volume status. 5. Total colonic diverticulosis. No changes of acute diverticulitis.   Aortic Atherosclerosis (ICD10-I70.0).   Electronically Signed: By: Rogelia Myers M.D. On: 01/14/2024 17:05    Imp/Recommendations: 1.  Urolithiasis: -Left UPJ calculus s/p left ureteral stent and right renal calculi - Risks and benefits of bilateral ureteroscopy with laser lithotripsy and stent placement/exchange discussed with the patient's husband and the patient.  Informed consent was obtained today.  Surgery today   Krisandra Bueno D Darsha Zumstein

## 2024-03-11 NOTE — Anesthesia Procedure Notes (Signed)
 Procedure Name: LMA Insertion Date/Time: 03/11/2024 10:42 AM  Performed by: Augusta Daved SAILOR, CRNAPre-anesthesia Checklist: Patient identified, Emergency Drugs available, Suction available and Patient being monitored Patient Re-evaluated:Patient Re-evaluated prior to induction Oxygen Delivery Method: Circle System Utilized Preoxygenation: Pre-oxygenation with 100% oxygen Induction Type: IV induction Ventilation: Mask ventilation without difficulty LMA: LMA inserted LMA Size: 3.0 Number of attempts: 1 Placement Confirmation: positive ETCO2 Tube secured with: Tape Dental Injury: Teeth and Oropharynx as per pre-operative assessment

## 2024-03-11 NOTE — ED Notes (Signed)
Ice placed on patient.

## 2024-03-11 NOTE — Op Note (Signed)
 Preoperative diagnosis: left ureteral calculus, right renal calculus  Postoperative diagnosis: left ureteral calculus, right renal calculus  Procedure:  Cystoscopy bilateral ureteroscopy, laser lithotripsy, basket stone extraction left 62F x 22cm ureteral stent exchange, right 62Fr x 22cm ureteral stent placement Right retrograde pyelogram  Surgeon: Valli Shank, MD  Anesthesia: General  Complications: None  Intraoperative findings:  Normal urethra Bilateral orthotropic ureteral orifices Right retrograde pyelography demonstrated a filling defect within the left renal pelvis consistent with the patient's known calculus without other abnormalities. Bladder mucosa normal without masses   EBL: Minimal  Specimens: left renal calculus  Disposition of specimens: [facility] for stone analysis  Indication: [name] is a 83 y.o.   patient with a left ureteral stone and associated left symptoms s/p emergent left stent placement for infected stone who now returns for definitive removal of stones. After reviewing the management options for treatment, the patient elected to proceed with the above surgical procedure(s). We have discussed the potential benefits and risks of the procedure, side effects of the proposed treatment, the likelihood of the patient achieving the goals of the procedure, and any potential problems that might occur during the procedure or recuperation. Informed consent has been obtained.   Description of procedure:  The patient was taken to the operating room and general anesthesia was induced.  The patient was placed in the dorsal lithotomy position, prepped and draped in the usual sterile fashion, and preoperative antibiotics were administered. A preoperative time-out was performed.   Cystourethroscopy was performed.  The patient's urethra was examined and was normal.   The bladder was then systematically examined in its entirety. There was no  evidence for any bladder tumors, stones, or other mucosal pathology.    Attention then turned to the left ureteral orifice and the existing ureteral stent was grasped with a grasper and brought to the urethral meatus.  A 0.38 sensor wire was then advanced through the ureteral stent up the left ureter and into the renal pelvis under fluoroscopic guidance.  The stent was removed.  A second wire was placed alongside the first wire in similar manner.  1 wire was secured as a safety wire.  A ureteral access sheath was then placed over the second wire advanced to the proximal ureter with fluoroscopic guidance.  The inner sheath and wire were removed.  Flexible ureteroscopy took place and the UPJ calculus was encountered.  It was then fragmented with a 242 m.  Reinspection of the ureter revealed no remaining visible stones or fragments.  The ureteroscope was then removed in unison with the access sheath take care to examine the ureter on the way out.  The wire was then backloaded through the cystoscope and a ureteral stent was advance over the wire using Seldinger technique.  The stent was positioned appropriately under fluoroscopic and cystoscopic guidance.  The wire was then removed with an adequate stent curl noted in the renal pelvis as well as in the bladder.  Attention then turned to the right side.  2 sensor wires were placed in a similar manner as well as an access sheath.  The inner sheath and wire were removed and flexible ureteroscopy took place.  A renal pelvis calculus was seen and dusted.  There was a second stone seen in the lower pole that was difficult to access.  Attempts at removing this were unsuccessful.  The ureteroscope was then removed in unison with the access sheath taking care to examine the ureter on the way  out.  There were no remaining stone fragments in the ureter or trauma noted.  The wire was then backloaded and a 6 [place] by 22 cm double-J ureteral stent was placed in standard  fashion.  The tether was left on both of the stents.  The bladder was then emptied and the procedure ended.  The patient appeared to tolerate the procedure well and without complications.  The patient was able to be awakened and transferred to the recovery unit in satisfactory condition.   Disposition: The tether of the stents were tucked inside the patient's vagina.  Instructions for removing the stent have been provided to the patient.

## 2024-03-12 ENCOUNTER — Encounter (HOSPITAL_COMMUNITY): Payer: Self-pay | Admitting: Urology

## 2024-03-12 DIAGNOSIS — N179 Acute kidney failure, unspecified: Secondary | ICD-10-CM | POA: Diagnosis not present

## 2024-03-12 DIAGNOSIS — A419 Sepsis, unspecified organism: Secondary | ICD-10-CM

## 2024-03-12 DIAGNOSIS — G9340 Encephalopathy, unspecified: Secondary | ICD-10-CM | POA: Diagnosis not present

## 2024-03-12 DIAGNOSIS — N3001 Acute cystitis with hematuria: Secondary | ICD-10-CM | POA: Diagnosis not present

## 2024-03-12 DIAGNOSIS — Z87442 Personal history of urinary calculi: Secondary | ICD-10-CM

## 2024-03-12 DIAGNOSIS — B952 Enterococcus as the cause of diseases classified elsewhere: Secondary | ICD-10-CM

## 2024-03-12 DIAGNOSIS — D649 Anemia, unspecified: Secondary | ICD-10-CM

## 2024-03-12 LAB — GLUCOSE, CAPILLARY
Glucose-Capillary: 111 mg/dL — ABNORMAL HIGH (ref 70–99)
Glucose-Capillary: 138 mg/dL — ABNORMAL HIGH (ref 70–99)
Glucose-Capillary: 151 mg/dL — ABNORMAL HIGH (ref 70–99)
Glucose-Capillary: 155 mg/dL — ABNORMAL HIGH (ref 70–99)

## 2024-03-12 LAB — BLOOD CULTURE ID PANEL (REFLEXED) - BCID2

## 2024-03-12 LAB — COMPREHENSIVE METABOLIC PANEL WITH GFR
ALT: 13 U/L (ref 0–44)
AST: 25 U/L (ref 15–41)
Albumin: 2.4 g/dL — ABNORMAL LOW (ref 3.5–5.0)
Alkaline Phosphatase: 42 U/L (ref 38–126)
Anion gap: 7 (ref 5–15)
BUN: 20 mg/dL (ref 8–23)
CO2: 24 mmol/L (ref 22–32)
Calcium: 8.1 mg/dL — ABNORMAL LOW (ref 8.9–10.3)
Chloride: 106 mmol/L (ref 98–111)
Creatinine, Ser: 1.2 mg/dL — ABNORMAL HIGH (ref 0.44–1.00)
GFR, Estimated: 45 mL/min — ABNORMAL LOW (ref >60)
Glucose, Bld: 131 mg/dL — ABNORMAL HIGH (ref 70–99)
Potassium: 3.4 mmol/L — ABNORMAL LOW (ref 3.5–5.1)
Sodium: 137 mmol/L (ref 135–145)
Total Bilirubin: 0.4 mg/dL (ref 0.0–1.2)
Total Protein: 5.6 g/dL — ABNORMAL LOW (ref 6.5–8.1)

## 2024-03-12 LAB — CBC
HCT: 27.4 % — ABNORMAL LOW (ref 36.0–46.0)
Hemoglobin: 9.4 g/dL — ABNORMAL LOW (ref 12.0–15.0)
MCH: 31.1 pg (ref 26.0–34.0)
MCHC: 34.3 g/dL (ref 30.0–36.0)
MCV: 90.7 fL (ref 80.0–100.0)
Platelets: 267 K/uL (ref 150–400)
RBC: 3.02 MIL/uL — ABNORMAL LOW (ref 3.87–5.11)
RDW: 14.4 % (ref 11.5–15.5)
WBC: 24.1 K/uL — ABNORMAL HIGH (ref 4.0–10.5)
nRBC: 0 % (ref 0.0–0.2)

## 2024-03-12 MED ORDER — CHLORHEXIDINE GLUCONATE CLOTH 2 % EX PADS
6.0000 | MEDICATED_PAD | Freq: Every day | CUTANEOUS | Status: DC
  Administered 2024-03-12 – 2024-03-19 (×7): 6 via TOPICAL

## 2024-03-12 MED ORDER — SODIUM CHLORIDE 0.9 % IV SOLN
400.0000 mg | Freq: Once | INTRAVENOUS | Status: AC
  Administered 2024-03-13: 400 mg via INTRAVENOUS
  Filled 2024-03-12: qty 4

## 2024-03-12 MED ORDER — POLYVINYL ALCOHOL 1.4 % OP SOLN
1.0000 [drp] | Freq: Every morning | OPHTHALMIC | Status: DC
  Administered 2024-03-14 – 2024-03-19 (×6): 1 [drp] via OPHTHALMIC
  Filled 2024-03-12: qty 15

## 2024-03-12 MED ORDER — POTASSIUM CHLORIDE CRYS ER 20 MEQ PO TBCR
40.0000 meq | EXTENDED_RELEASE_TABLET | Freq: Once | ORAL | Status: AC
  Administered 2024-03-12: 40 meq via ORAL
  Filled 2024-03-12: qty 2

## 2024-03-12 MED ORDER — MIRABEGRON ER 50 MG PO TB24
50.0000 mg | ORAL_TABLET | Freq: Every morning | ORAL | Status: DC
Start: 2024-03-12 — End: 2024-03-20
  Administered 2024-03-12 – 2024-03-20 (×9): 50 mg via ORAL
  Filled 2024-03-12 (×9): qty 1

## 2024-03-12 MED ORDER — MEMANTINE HCL 10 MG PO TABS
5.0000 mg | ORAL_TABLET | Freq: Two times a day (BID) | ORAL | Status: DC
  Administered 2024-03-12 – 2024-03-20 (×16): 5 mg via ORAL
  Filled 2024-03-12 (×16): qty 1

## 2024-03-12 MED ORDER — ENOXAPARIN SODIUM 30 MG/0.3ML IJ SOSY: 20.0000 mg | PREFILLED_SYRINGE | Freq: Every day | INTRAMUSCULAR | Status: DC

## 2024-03-12 MED ORDER — ESCITALOPRAM OXALATE 10 MG PO TABS
20.0000 mg | ORAL_TABLET | Freq: Every morning | ORAL | Status: DC
Start: 2024-03-13 — End: 2024-03-20
  Administered 2024-03-13 – 2024-03-20 (×8): 20 mg via ORAL
  Filled 2024-03-12 (×8): qty 2

## 2024-03-12 MED ORDER — SODIUM CHLORIDE 0.9 % IV SOLN
2.0000 g | Freq: Three times a day (TID) | INTRAVENOUS | Status: DC
  Administered 2024-03-12 – 2024-03-16 (×11): 2 g via INTRAVENOUS
  Filled 2024-03-12 (×12): qty 2000

## 2024-03-12 MED ORDER — DONEPEZIL HCL 10 MG PO TABS
10.0000 mg | ORAL_TABLET | Freq: Every morning | ORAL | Status: DC
Start: 2024-03-12 — End: 2024-03-20
  Administered 2024-03-12 – 2024-03-20 (×9): 10 mg via ORAL
  Filled 2024-03-12 (×9): qty 1

## 2024-03-12 MED ORDER — HEPARIN SODIUM (PORCINE) 5000 UNIT/ML IJ SOLN
5000.0000 [IU] | Freq: Three times a day (TID) | INTRAMUSCULAR | Status: DC
  Administered 2024-03-12 – 2024-03-20 (×22): 5000 [IU] via SUBCUTANEOUS
  Filled 2024-03-12 (×23): qty 1

## 2024-03-12 NOTE — Progress Notes (Addendum)
 MEWS Progress Note  Patient Details Name: [name] MRN: [id number] DOB: March 17, 1941 Today's Date: 03/12/2024   MEWS Flowsheet Documentation:  Assess: MEWS Score Temp: (!) 101.7 F (38.7 C) BP: (!) 104/42 MAP (mmHg): (!) 62 Pulse Rate: 86 ECG Heart Rate: 85 Resp: 16 Level of Consciousness: Responds to Voice SpO2: 97 % O2 Device: Room Air Patient Activity (if Appropriate): In bed O2 Flow Rate (L/min): 2 L/min Assess: MEWS Score MEWS Temp: 2 MEWS Systolic: 0 MEWS Pulse: 0 MEWS RR: 0 MEWS LOC: 1 MEWS Score: 3 MEWS Score Color: Yellow Assess: SIRS CRITERIA SIRS Temperature : 1 SIRS Respirations : 0 SIRS Pulse: 0 SIRS WBC: 1 SIRS Score Sum : 2 SIRS Temperature : 1 SIRS Pulse: 0 SIRS Respirations : 0 SIRS WBC: 1 SIRS Score Sum : 2 Assess: if the MEWS score is Yellow or Red Were vital signs accurate and taken at a resting state?: Yes Does the patient meet 2 or more of the SIRS criteria?: Yes Does the patient have a confirmed or suspected source of infection?: Yes MEWS guidelines implemented : Yes, yellow Treat MEWS Interventions: Considered administering scheduled or prn medications/treatments as ordered Take Vital Signs Increase Vital Sign Frequency : Yellow: Q2hr x1, continue Q4hrs until patient remains green for 12hrs Escalate MEWS: Escalate: Yellow: Discuss with charge nurse and consider notifying provider and/or RRT Notify: Charge Nurse/RN Name of Charge Nurse/RN Notified: Museum/gallery curator  Administered tylenol  as ordered for her temp 101.7. Her mental status is improving.   Loyce Blazing 03/12/2024, 1:06 PM

## 2024-03-12 NOTE — Progress Notes (Signed)
 PROGRESS NOTE    [name]  FMW:[id number] DOB: 1941/01/11 DOA: 03/11/2024 PCP: Patti Mliss LABOR, FNP   Brief Narrative:  [name] is a 83 y.o. female who presents from home with worsening mental status accompanied by family, husband, who indicates she was previously well with no prior complaints.  Patient had bilateral ureteroscopy with laser lithotripsy stone extraction with left ureteral stent exchange and right ureteral stent placement.  Patient recovered well and ultimately discharged home from procedure.  While at home patient's mental status continued to deteriorate and husband brought her to the hospital for further evaluation and treatment.  In the ED patient noted to be febrile tachycardic hypotensive meeting sepsis criteria given presumed infection of UTI with recent instrumentation.  Foley placed at intake per urology request, antibiotics initiated with broad-spectrum coverage.  Assessment & Plan:   Principal Problem:   Sepsis (HCC) Active Problems:   COPD (chronic obstructive pulmonary disease) (HCC)   Memory loss   UTI (urinary tract infection)   Acute encephalopathy   ARF (acute renal failure) (HCC)   Anemia  Sepsis secondary to UTI, PO Rule out bacteremia - Recent bilateral ureteral stent placements 820 1:25 AM with laser lithotripsy -Concern for infected nephrolithiasis with disseminated infection after lithotripsy given patient's improvement postoperatively this is unlikely related to anesthesia or medications. -Continue broad-spectrum antibiotics vancomycin , Zosyn  until cultures result - Blood culture initially positive for gram-positive cocci, likely contaminant given 1 out of 2 cultures positive  Acute metabolic encephalopathy secondary to sepsis. Appears to be improving slowly, follow with family given history of vascular dementia unclear baseline at this time.  Rule out acute kidney injury versus evolving CKD 2 - Complicated by recent  hydronephrosis/obstructing nephrolithiasis, recent stent placements and urological procedure.   - Baseline creatinine earlier this year 0.7 -more recently 1.1-1.4; elevation over the last month likely secondary to above, not her true baseline. Currently 1.2 -Continue IV fluids, advance diet as tolerated, follow repeat labs  History of dementia takes donepezil , memantine - continue  Anemia, normocytic/chronic, likely anemia chronic disease given above  - No acute signs or symptoms of bleeding, follow repeat labs - Iron panel pending  Prior history of TIA takes aspirin . COPD mentioned the chart presently not wheezing.  DVT prophylaxis: heparin  injection 5,000 Units Start: 03/12/24 1015 Code Status:   Code Status: Full Code  Family Communication: None present  Status is: Inpatient  Dispo: The patient is from: Home              Anticipated d/c is to: Home              Anticipated d/c date is: To be determined              Patient currently not medically stable for discharge  Consultants:  Urology  Procedures:  None  Antimicrobials:  Vancomycin , Zosyn   Subjective: No acute issues or events overnight, patient reports feeling really no better than when admitted but otherwise denies nausea vomiting diarrhea constipation headache fevers chills or chest pain  Objective: Vitals:   03/12/24 0700 03/12/24 0808 03/12/24 1218 03/12/24 1249  BP: (!) 116/45 (!) 105/42 (!) 122/45 (!) 104/42  Pulse: 93 83 89 86  Resp:  14 16   Temp:  (!) 100.4 F (38 C) (!) 100.9 F (38.3 C) (!) 101.7 F (38.7 C)  TempSrc:  Oral Oral Axillary  SpO2: 94% 95% 96% 97%  Weight:      Height:  Intake/Output Summary (Last 24 hours) at 03/12/2024 1344 Last data filed at 03/12/2024 0500 Gross per 24 hour  Intake 1315 ml  Output 600 ml  Net 715 ml   Filed Weights   03/11/24 1726 03/11/24 2159 03/12/24 0500  Weight: 42.6 kg 45.9 kg 45.6 kg    Examination:  General:  Pleasantly resting in  bed, No acute distress.  Alert to person only HEENT:  Normocephalic atraumatic.  Sclerae nonicteric, noninjected.  Extraocular movements intact bilaterally. Neck:  Without mass or deformity.  Trachea is midline. Lungs:  Clear to auscultate bilaterally without rhonchi, wheeze, or rales. Heart:  Regular rate and rhythm.  Without murmurs, rubs, or gallops. Abdomen:  Soft, minimally tender lower abdomen midline, nondistended.  Without guarding or rebound. Extremities: Without cyanosis, clubbing, edema, or obvious deformity. Skin:  Warm and dry, no erythema.  Data Reviewed: I have personally reviewed following labs and imaging studies  CBC: Recent Labs  Lab 03/09/24 1350 03/11/24 1748 03/11/24 1753 03/11/24 2231 03/12/24 0525  WBC 7.5 16.1*  --  24.9* 24.1*  NEUTROABS  --  15.1*  --   --   --   HGB 10.4* 10.0* 10.9* 9.7* 9.4*  HCT 33.2* 30.9* 32.0* 29.3* 27.4*  MCV 94.6 94.5  --  92.4 90.7  PLT 330 296  --  266 267   Basic Metabolic Panel: Recent Labs  Lab 03/09/24 1350 03/11/24 1748 03/11/24 1753 03/11/24 2231 03/12/24 0525  NA 137 139 139  --  137  K 3.9 3.7 4.0  --  3.4*  CL 102 108  --   --  106  CO2 26 23  --   --  24  GLUCOSE 106* 94  --   --  131*  BUN 26* 21  --   --  20  CREATININE 0.94 1.02*  --  1.25* 1.20*  CALCIUM 9.3 7.3*  --   --  8.1*   GFR: Estimated Creatinine Clearance: 22.5 mL/min (A) (by C-G formula based on SCr of 1.2 mg/dL (H)).  Liver Function Tests: Recent Labs  Lab 03/11/24 1748 03/12/24 0525  AST 22 25  ALT 28 13  ALKPHOS 49 42  BILITOT 0.8 0.4  PROT 5.4* 5.6*  ALBUMIN 2.4* 2.4*   Cardiac Enzymes: Recent Labs  Lab 03/11/24 1748  CKTOTAL 86   CBG: Recent Labs  Lab 03/11/24 1739 03/11/24 2336 03/12/24 0725 03/12/24 1217  GLUCAP 102* 143* 138* 151*    Thyroid  Function Tests: Recent Labs    03/11/24 1748  TSH 1.381   Sepsis Labs: Recent Labs  Lab 03/11/24 1754  LATICACIDVEN 1.4    Recent Results (from the past  240 hours)  Blood culture (routine x 2)     Status: None (Preliminary result)   Collection Time: 03/11/24  7:05 PM   Specimen: BLOOD  Result Value Ref Range Status   Specimen Description BLOOD LEFT ANTECUBITAL  Final   Special Requests   Final    BOTTLES DRAWN AEROBIC AND ANAEROBIC Blood Culture adequate volume   Culture  Setup Time   Final    GRAM POSITIVE COCCI IN CHAINS ANAEROBIC BOTTLE ONLY Organism ID to follow Performed at [facility], [address] [place], [place] [address]    Culture GRAM POSITIVE COCCI IN CHAINS  Final   Report Status PENDING  Incomplete  Blood culture (routine x 2)     Status: None (Preliminary result)   Collection Time: 03/11/24  7:09 PM   Specimen: BLOOD  Result Value Ref Range Status   Specimen Description BLOOD RIGHT ANTECUBITAL  Final   Special Requests   Final    BOTTLES DRAWN AEROBIC AND ANAEROBIC Blood Culture results may not be optimal due to an inadequate volume of blood received in culture bottles   Culture   Final    NO GROWTH < 24 HOURS Performed at [facility], [address] [place], [place] [address]    Report Status PENDING  Incomplete         Radiology Studies: DG Chest Portable 1 View Result Date: 03/11/2024 CLINICAL DATA:  Fever. EXAM: PORTABLE CHEST 1 VIEW COMPARISON:  Chest radiograph dated 01/16/2024. FINDINGS: No focal consolidation, pleural effusion or pneumothorax. The cardiac silhouette is within normal limits. Atherosclerotic calcification of the aorta. No acute osseous pathology. IMPRESSION: No active disease. Electronically Signed   By: Vanetta Chou M.D.   On: 03/11/2024 18:06   DG C-Arm 1-60 Min-No Report Result Date: 03/11/2024 Fluoroscopy was utilized by the requesting physician.  No radiographic interpretation.   DG C-Arm 1-60 Min-No Report Result Date: 03/11/2024 Fluoroscopy was utilized by the requesting physician.  No radiographic interpretation.        Scheduled Meds:   Chlorhexidine  Gluconate Cloth  6 each Topical Daily   [START ON 03/13/2024] donepezil   10 mg Oral q AM   [START ON 03/13/2024] escitalopram   20 mg Oral q AM   heparin  injection (subcutaneous)  5,000 Units Subcutaneous Q8H   memantine   5 mg Oral BID   [START ON 03/13/2024] mirabegron  ER  50 mg Oral q AM   [START ON 03/13/2024] Polyethylene Glycol 400  1 drop Both Eyes q AM   Continuous Infusions:  dextrose  5% lactated ringers  100 mL/hr at 03/12/24 1022   piperacillin -tazobactam (ZOSYN )  IV 3.375 g (03/12/24 1301)   [START ON 03/13/2024] vancomycin        LOS: 1 day   Time spent:  Elsie JAYSON Montclair, DO [facility]  If 7PM-7AM, please contact night-coverage [email or web address]  03/12/2024, 1:44 PM

## 2024-03-12 NOTE — Progress Notes (Signed)
 PHARMACY - PHYSICIAN COMMUNICATION CRITICAL VALUE ALERT - BLOOD CULTURE IDENTIFICATION (BCID)  [name] is an 83 y.o. female who presented to [facility] on 03/11/2024 with a chief complaint of decreased response  Assessment:  1/4 Enterococcus faecalis bacteremia (possible urinary source)  Name of physician (or Provider) Contacted: Elsie JAYSON Montclair, MD  Current antibiotics: Vancomycin  + Zosyn   Changes to prescribed antibiotics recommended:  Recommendations accepted by provider, narrowing to ampicillin .  Results for orders placed or performed during the hospital encounter of 03/11/24  Blood Culture ID Panel (Reflexed) (Collected: 03/11/2024  7:05 PM)  Result Value Ref Range   Enterococcus faecalis DETECTED (A) NOT DETECTED   Enterococcus Faecium NOT DETECTED NOT DETECTED   Listeria monocytogenes NOT DETECTED NOT DETECTED   Staphylococcus species NOT DETECTED NOT DETECTED   Staphylococcus aureus (BCID) NOT DETECTED NOT DETECTED   Staphylococcus epidermidis NOT DETECTED NOT DETECTED   Staphylococcus lugdunensis NOT DETECTED NOT DETECTED   Streptococcus species NOT DETECTED NOT DETECTED   Streptococcus agalactiae NOT DETECTED NOT DETECTED   Streptococcus pneumoniae NOT DETECTED NOT DETECTED   Streptococcus pyogenes NOT DETECTED NOT DETECTED   A.calcoaceticus-baumannii NOT DETECTED NOT DETECTED   Bacteroides fragilis NOT DETECTED NOT DETECTED   Enterobacterales NOT DETECTED NOT DETECTED   Enterobacter cloacae complex NOT DETECTED NOT DETECTED   Escherichia coli NOT DETECTED NOT DETECTED   Klebsiella aerogenes NOT DETECTED NOT DETECTED   Klebsiella oxytoca NOT DETECTED NOT DETECTED   Klebsiella pneumoniae NOT DETECTED NOT DETECTED   Proteus species NOT DETECTED NOT DETECTED   Salmonella species NOT DETECTED NOT DETECTED   Serratia marcescens NOT DETECTED NOT DETECTED   Haemophilus influenzae NOT DETECTED NOT DETECTED   Neisseria meningitidis NOT DETECTED NOT DETECTED    Pseudomonas aeruginosa NOT DETECTED NOT DETECTED   Stenotrophomonas maltophilia NOT DETECTED NOT DETECTED   Candida albicans NOT DETECTED NOT DETECTED   Candida auris NOT DETECTED NOT DETECTED   Candida glabrata NOT DETECTED NOT DETECTED   Candida krusei NOT DETECTED NOT DETECTED   Candida parapsilosis NOT DETECTED NOT DETECTED   Candida tropicalis NOT DETECTED NOT DETECTED   Cryptococcus neoformans/gattii NOT DETECTED NOT DETECTED   Vancomycin  resistance NOT DETECTED NOT DETECTED    Thank you for allowing pharmacy to be involved with this patient's care.  Mendel Barter, PharmD PGY1 Clinical Pharmacist [facility]  03/12/2024 3:32 PM

## 2024-03-12 NOTE — Plan of Care (Signed)
  Problem: Clinical Measurements: Goal: Respiratory complications will improve Outcome: Progressing   Problem: Coping: Goal: Level of anxiety will decrease Outcome: Progressing   Problem: Elimination: Goal: Will not experience complications related to urinary retention Outcome: Progressing   Problem: Pain Managment: Goal: General experience of comfort will improve and/or be controlled Outcome: Progressing   Problem: Safety: Goal: Ability to remain free from injury will improve Outcome: Progressing   Problem: Education: Goal: Knowledge of General Education information will improve Description: Including pain rating scale, medication(s)/side effects and non-pharmacologic comfort measures Outcome: Not Met (add Reason) Note: On-going   Problem: Clinical Measurements: Goal: Ability to maintain clinical measurements within normal limits will improve Outcome: Not Met (add Reason) Note: On-going Goal: Diagnostic test results will improve Outcome: Not Met (add Reason) Note: On-going

## 2024-03-12 NOTE — Consult Note (Signed)
 [facility]    Date of Admission:  03/11/2024     Total days of antibiotics 2               Reason for Consult: Enterococcus Bactermia  Referring Provider: Champ/autoconsult Primary Care Provider: Patti Mliss LABOR, FNP   ASSESSMENT:  [name] is an 83 y/o caucasian female with vascular dementia and recent Enterococcal bacteremia with urinary source s/p treatment in March 2025 presenting with acute onset altered mental status and fever following a bilateral stent placement and lithotripsy for stone removal and found to have Enterococcus faecalis bacteremia.  [name] has new Enterococcal bacteremia likely stirred from recent Urology procedure. Will need repeat blood cultures for clearance of bacteremia. TTE to check for endocardits and may need TEE although DENOVA score appears low currently. Narrow antibiotics to ampicillin . Urinary catheter and post-surgical wound care per Urology. Standard/universal precautions. Remaining medical and supportive care per Internal Medicine.   PLAN:  Narrow antibiotics to ampicillin .  Obtain blood cultures for clearance of bacteremia. TTE to check for endocarditis Foley catheter and post-surgical wound care per Urology. Standard/universal precautions.  Remaining medical and supportive care per Internal Medicine.    Principal Problem:   Bacteremia due to Enterococcus Active Problems:   COPD (chronic obstructive pulmonary disease) (HCC)   Memory loss   UTI (urinary tract infection)   Sepsis (HCC)   Acute encephalopathy   ARF (acute renal failure) (HCC)   Anemia    artificial tears  1 drop Both Eyes q morning   Chlorhexidine  Gluconate Cloth  6 each Topical Daily   donepezil   10 mg Oral q morning   [START ON 03/13/2024] escitalopram   20 mg Oral q morning   heparin  injection (subcutaneous)  5,000 Units Subcutaneous Q8H   memantine   5 mg Oral BID   mirabegron  ER  50 mg Oral q morning     HPI: [name] is a 83 y.o. female with previous medical history of vascular dementia, overactive bladder, and enterococcal bacteremia s/p treatment in March 2025 presenting from home with acute onset altered mental status.  [name] is known to the ID service having been seen by Dr. Overton in March 2025 with Enterococcus faecalis bacteremia with suspected urinary source. Plan of care was to complete 2 weeks of antibiotics with amoxicillin  1 g q 8 and no clinic follow up.  [name] underwent bilateral uretral stent placement and lithotripsy on 03/11/24 for bilateral stone removal. Following procedure she was unable to get out of the care and had acute confusion and lethargy. Febrile with temperature of 103.1 F and white blood cell count of 24,100. Chest x-ray with no active disease. Initially started on vancomycin  and ampicillin /sulbactam. Urology consulted with recommendation for continuation of broad spectrum antibiotics and placement of Foley catheter that can be removed when appropriate. Blood cultures have turned positive for Enterococcus faecalis.   [name] is at beside and provides additional details. She is much improved since arriving to the hospital. No artificial joints or implants. Does smoke tobacco and requesting nicotine  patch. Has had some issues with diarrhea recently that has improved with dietary changes.   Review of Systems: Review of Systems  Unable to perform ROS: Dementia     Past Medical History:  Diagnosis Date   Anemia    Anxiety    Arthritis    Chronic kidney disease    COPD (chronic obstructive pulmonary disease) (HCC)    Dementia (  HCC)    vascular dementia   GERD (gastroesophageal reflux disease)    History of kidney stones    Macular degeneration of left eye    Wet and gets injections q monthly   Smoker    1- 1 1/2 ppd  per Husband   Stroke (HCC)    TIAs    Social History   Tobacco Use   Smoking status: Every Day    Current packs/day: 1.00    Average  packs/day: 1 pack/day for 50.0 years (50.0 ttl pk-yrs)    Types: Cigarettes   Smokeless tobacco: Never  Vaping Use   Vaping status: Never Used  Substance Use Topics   Alcohol  use: No   Drug use: No    Family History  Problem Relation Age of Onset   Cancer Brother    Diabetes Brother    Heart disease Brother    Cancer Sister    Diabetes Sister    Breast cancer Neg Hx     Allergies  Allergen Reactions   Sulfa Antibiotics Itching and Rash    OBJECTIVE: Blood pressure (!) 99/45, pulse 95, temperature 99.7 F (37.6 C), temperature source Axillary, resp. rate 16, height 4' 10 (1.473 m), weight 45.6 kg, SpO2 96%.  Physical Exam Constitutional:      General: She is not in acute distress.    Appearance: She is well-developed.     Comments: Lying in bed with head of bed partially elevated.   Cardiovascular:     Rate and Rhythm: Normal rate and regular rhythm.     Heart sounds: Normal heart sounds.  Pulmonary:     Effort: Pulmonary effort is normal.     Breath sounds: Normal breath sounds.  Skin:    General: Skin is warm and dry.  Neurological:     Mental Status: She is alert and oriented to person, place, and time.     Lab Results Lab Results  Component Value Date   WBC 24.1 (H) 03/12/2024   HGB 9.4 (L) 03/12/2024   HCT 27.4 (L) 03/12/2024   MCV 90.7 03/12/2024   PLT 267 03/12/2024    Lab Results  Component Value Date   CREATININE 1.20 (H) 03/12/2024   BUN 20 03/12/2024   NA 137 03/12/2024   K 3.4 (L) 03/12/2024   CL 106 03/12/2024   CO2 24 03/12/2024    Lab Results  Component Value Date   ALT 13 03/12/2024   AST 25 03/12/2024   ALKPHOS 42 03/12/2024   BILITOT 0.4 03/12/2024     Microbiology: Recent Results (from the past 240 hours)  Blood culture (routine x 2)     Status: None (Preliminary result)   Collection Time: 03/11/24  7:05 PM   Specimen: BLOOD  Result Value Ref Range Status   Specimen Description BLOOD LEFT ANTECUBITAL  Final   Special  Requests   Final    BOTTLES DRAWN AEROBIC AND ANAEROBIC Blood Culture adequate volume   Culture  Setup Time   Final    GRAM POSITIVE COCCI IN CHAINS ANAEROBIC BOTTLE ONLY CRITICAL RESULT CALLED TO, READ BACK BY AND VERIFIED WITH: PHARMD KARLEN BROVEY ON 03/12/24 @ 1351 BY DRT Performed at [facility], [address], [place], [place] [address]    Culture GRAM POSITIVE COCCI IN CHAINS  Final   Report Status PENDING  Incomplete  Blood Culture ID Panel (Reflexed)     Status: Abnormal   Collection Time: 03/11/24  7:05 PM  Result Value  Ref Range Status   Enterococcus faecalis DETECTED (A) NOT DETECTED Final    Comment: CRITICAL RESULT CALLED TO, READ BACK BY AND VERIFIED WITH: PHARMD KARLEN BROVEY ON 03/12/24 @ 1351 BY DRT    Enterococcus Faecium NOT DETECTED NOT DETECTED Final   Listeria monocytogenes NOT DETECTED NOT DETECTED Final   Staphylococcus species NOT DETECTED NOT DETECTED Final   Staphylococcus aureus (BCID) NOT DETECTED NOT DETECTED Final   Staphylococcus epidermidis NOT DETECTED NOT DETECTED Final   Staphylococcus lugdunensis NOT DETECTED NOT DETECTED Final   Streptococcus species NOT DETECTED NOT DETECTED Final   Streptococcus agalactiae NOT DETECTED NOT DETECTED Final   Streptococcus pneumoniae NOT DETECTED NOT DETECTED Final   Streptococcus pyogenes NOT DETECTED NOT DETECTED Final   A.calcoaceticus-baumannii NOT DETECTED NOT DETECTED Final   Bacteroides fragilis NOT DETECTED NOT DETECTED Final   Enterobacterales NOT DETECTED NOT DETECTED Final   Enterobacter cloacae complex NOT DETECTED NOT DETECTED Final   Escherichia coli NOT DETECTED NOT DETECTED Final   Klebsiella aerogenes NOT DETECTED NOT DETECTED Final   Klebsiella oxytoca NOT DETECTED NOT DETECTED Final   Klebsiella pneumoniae NOT DETECTED NOT DETECTED Final   Proteus species NOT DETECTED NOT DETECTED Final   Salmonella species NOT DETECTED NOT DETECTED Final   Serratia marcescens NOT DETECTED NOT  DETECTED Final   Haemophilus influenzae NOT DETECTED NOT DETECTED Final   Neisseria meningitidis NOT DETECTED NOT DETECTED Final   Pseudomonas aeruginosa NOT DETECTED NOT DETECTED Final   Stenotrophomonas maltophilia NOT DETECTED NOT DETECTED Final   Candida albicans NOT DETECTED NOT DETECTED Final   Candida auris NOT DETECTED NOT DETECTED Final   Candida glabrata NOT DETECTED NOT DETECTED Final   Candida krusei NOT DETECTED NOT DETECTED Final   Candida parapsilosis NOT DETECTED NOT DETECTED Final   Candida tropicalis NOT DETECTED NOT DETECTED Final   Cryptococcus neoformans/gattii NOT DETECTED NOT DETECTED Final   Vancomycin  resistance NOT DETECTED NOT DETECTED Final    Comment: Performed at [facility], [address] [place], [place] [address]  Blood culture (routine x 2)     Status: None (Preliminary result)   Collection Time: 03/11/24  7:09 PM   Specimen: BLOOD  Result Value Ref Range Status   Specimen Description BLOOD RIGHT ANTECUBITAL  Final   Special Requests   Final    BOTTLES DRAWN AEROBIC AND ANAEROBIC Blood Culture results may not be optimal due to an inadequate volume of blood received in culture bottles   Culture   Final    NO GROWTH < 24 HOURS Performed at [facility], [address] [place], [place] [address]    Report Status PENDING  Incomplete    I have personally spent 32 minutes involved in face-to-face and non-face-to-face activities for this patient on the day of the visit. Professional time spent includes the following activities: preparing to see the patient (review of tests), obtaining and reviewing separately obtained history (admission/discharge record), performing a medically appropriate examination, ordering medications, communicating with other health care professionals, documenting clinical information in the EMR, communicating results and counseling patient and family regarding medication and plan of care, and care coordination.   Greg  Katlin Bortner, NP [facility]  03/12/2024  3:14 PM

## 2024-03-13 ENCOUNTER — Inpatient Hospital Stay (HOSPITAL_COMMUNITY)

## 2024-03-13 DIAGNOSIS — R7881 Bacteremia: Secondary | ICD-10-CM | POA: Diagnosis not present

## 2024-03-13 DIAGNOSIS — A419 Sepsis, unspecified organism: Secondary | ICD-10-CM | POA: Diagnosis not present

## 2024-03-13 DIAGNOSIS — G9340 Encephalopathy, unspecified: Secondary | ICD-10-CM | POA: Diagnosis not present

## 2024-03-13 DIAGNOSIS — N179 Acute kidney failure, unspecified: Secondary | ICD-10-CM | POA: Diagnosis not present

## 2024-03-13 DIAGNOSIS — N3001 Acute cystitis with hematuria: Secondary | ICD-10-CM | POA: Diagnosis not present

## 2024-03-13 LAB — ECHOCARDIOGRAM COMPLETE
AR max vel: 2.36 cm2
AV Area VTI: 2.64 cm2
AV Area mean vel: 2.45 cm2
AV Mean grad: 4 mmHg
AV Peak grad: 7.5 mmHg
Ao pk vel: 1.37 m/s
Area-P 1/2: 3.53 cm2
Calc EF: 64 %
Height: 58 in
S' Lateral: 2.9 cm
Single Plane A2C EF: 63.6 %
Single Plane A4C EF: 64.3 %
Weight: 1608.48 [oz_av]

## 2024-03-13 LAB — BASIC METABOLIC PANEL WITH GFR
Anion gap: 3 — ABNORMAL LOW (ref 5–15)
BUN: 18 mg/dL (ref 8–23)
CO2: 25 mmol/L (ref 22–32)
Calcium: 7.9 mg/dL — ABNORMAL LOW (ref 8.9–10.3)
Chloride: 110 mmol/L (ref 98–111)
Creatinine, Ser: 1.09 mg/dL — ABNORMAL HIGH (ref 0.44–1.00)
GFR, Estimated: 50 mL/min — ABNORMAL LOW (ref >60)
Glucose, Bld: 114 mg/dL — ABNORMAL HIGH (ref 70–99)
Potassium: 3.4 mmol/L — ABNORMAL LOW (ref 3.5–5.1)
Sodium: 138 mmol/L (ref 135–145)

## 2024-03-13 LAB — CBC
HCT: 25.4 % — ABNORMAL LOW (ref 36.0–46.0)
Hemoglobin: 8.3 g/dL — ABNORMAL LOW (ref 12.0–15.0)
MCH: 30.2 pg (ref 26.0–34.0)
MCHC: 32.7 g/dL (ref 30.0–36.0)
MCV: 92.4 fL (ref 80.0–100.0)
Platelets: 239 K/uL (ref 150–400)
RBC: 2.75 MIL/uL — ABNORMAL LOW (ref 3.87–5.11)
RDW: 14.8 % (ref 11.5–15.5)
WBC: 11.1 K/uL — ABNORMAL HIGH (ref 4.0–10.5)
nRBC: 0 % (ref 0.0–0.2)

## 2024-03-13 LAB — URINE CULTURE: Culture: <10000 — AB

## 2024-03-13 LAB — GLUCOSE, CAPILLARY
Glucose-Capillary: 110 mg/dL — ABNORMAL HIGH (ref 70–99)
Glucose-Capillary: 126 mg/dL — ABNORMAL HIGH (ref 70–99)
Glucose-Capillary: 140 mg/dL — ABNORMAL HIGH (ref 70–99)
Glucose-Capillary: 170 mg/dL — ABNORMAL HIGH (ref 70–99)

## 2024-03-13 LAB — C DIFFICILE QUICK SCREEN W PCR REFLEX
C Diff antigen: NEGATIVE
C Diff interpretation: NOT DETECTED
C Diff toxin: NEGATIVE

## 2024-03-13 LAB — IRON AND TIBC
Iron: 10 ug/dL — ABNORMAL LOW (ref 28–170)
Saturation Ratios: 4 % — ABNORMAL LOW (ref 10.4–31.8)
TIBC: 244 ug/dL — ABNORMAL LOW (ref 250–450)
UIBC: 234 ug/dL

## 2024-03-13 LAB — FERRITIN: Ferritin: 75 ng/mL (ref 11–307)

## 2024-03-13 MED ORDER — ENSURE PLUS HIGH PROTEIN PO LIQD
237.0000 mL | Freq: Two times a day (BID) | ORAL | Status: DC
  Administered 2024-03-14 – 2024-03-20 (×13): 237 mL via ORAL

## 2024-03-13 MED ORDER — NICOTINE 21 MG/24HR TD PT24
21.0000 mg | MEDICATED_PATCH | Freq: Every day | TRANSDERMAL | Status: DC
  Administered 2024-03-13 – 2024-03-20 (×8): 21 mg via TRANSDERMAL
  Filled 2024-03-13 (×8): qty 1

## 2024-03-13 MED ORDER — GERHARDT'S BUTT CREAM
TOPICAL_CREAM | Freq: Two times a day (BID) | CUTANEOUS | Status: DC
  Filled 2024-03-13 (×4): qty 60

## 2024-03-13 NOTE — Plan of Care (Signed)
   Problem: Coping: Goal: Level of anxiety will decrease Outcome: Progressing   Problem: Elimination: Goal: Will not experience complications related to bowel motility Outcome: Progressing

## 2024-03-13 NOTE — Progress Notes (Signed)
 Echocardiogram 2D Echocardiogram has been performed.  Masato Pettie N Jazalynn Mireles,RDCS 03/13/2024, 2:33 PM

## 2024-03-13 NOTE — Progress Notes (Signed)
  Subjective: Doing well no N/V. Tolerating diet no F/C. WBC improved dramatically   Objective: Vital signs in last 24 hours: Temp:  [97.8 F (36.6 C)-103 F (39.4 C)] 97.8 F (36.6 C) (08/23 0817) Pulse Rate:  [77-106] 92 (08/23 0817) Resp:  [16-20] 17 (08/23 0817) BP: (97-122)/(40-51) 102/46 (08/23 0817) SpO2:  [92 %-97 %] 96 % (08/23 0817)  Intake/Output from previous day: 08/22 0701 - 08/23 0700 In: 1480.9 [P.O.:415.5; I.V.:815.6; IV Piggyback:249.7] Out: 1100 [Urine:1100] Intake/Output this shift: Total I/O In: 594 [P.O.:594] Out: -   Physical Exam:  General: Alert and oriented CV: RRR Lungs: Clear GU: catheter in place, stent still in bladder, strings visible   Lab Results: Recent Labs    03/11/24 2231 03/12/24 0525 03/13/24 0507  HGB 9.7* 9.4* 8.3*  HCT 29.3* 27.4* 25.4*   BMET Recent Labs    03/12/24 0525 03/13/24 0507  NA 137 138  K 3.4* 3.4*  CL 106 110  CO2 24 25  GLUCOSE 131* 114*  BUN 20 18  CREATININE 1.20* 1.09*  CALCIUM 8.1* 7.9*     Studies/Results: DG Chest Portable 1 View Result Date: 03/11/2024 CLINICAL DATA:  Fever. EXAM: PORTABLE CHEST 1 VIEW COMPARISON:  Chest radiograph dated 01/16/2024. FINDINGS: No focal consolidation, pleural effusion or pneumothorax. The cardiac silhouette is within normal limits. Atherosclerotic calcification of the aorta. No acute osseous pathology. IMPRESSION: No active disease. Electronically Signed   By: Vanetta Chou M.D.   On: 03/11/2024 18:06   DG C-Arm 1-60 Min-No Report Result Date: 03/11/2024 Fluoroscopy was utilized by the requesting physician.  No radiographic interpretation.   DG C-Arm 1-60 Min-No Report Result Date: 03/11/2024 Fluoroscopy was utilized by the requesting physician.  No radiographic interpretation.    Assessment/Plan: 31 F w/ hx of dementia s/p BL URS and LL w/ bilateral 6x62fr stent represented for sepsis. Catheter in place and stents stil in place.   # Post URS sepsis   - Cr improving to baseline  - WBC 11.1 - improved from 24  - afebrile non tachycardic  - blood Cx pending - no growht currently - UCX- insignificant growth  - once patient stable would recommend removing catheter without removing the stents  -Leave stents in place - recommend 24 hr trial of oral abx prior to DC to confirm adequate coverage    LOS: 2 days   Jackey Pea MD 03/13/2024, 9:28 AM [facility]

## 2024-03-13 NOTE — Plan of Care (Signed)
  Problem: Health Behavior/Discharge Planning: Goal: Ability to manage health-related needs will improve Outcome: Progressing   Problem: Clinical Measurements: Goal: Ability to maintain clinical measurements within normal limits will improve Outcome: Progressing Goal: Will remain free from infection Outcome: Not Applicable Goal: Diagnostic test results will improve Outcome: Progressing Goal: Respiratory complications will improve Outcome: Not Applicable Goal: Cardiovascular complication will be avoided Outcome: Not Applicable   Problem: Nutrition: Goal: Adequate nutrition will be maintained Outcome: Progressing

## 2024-03-13 NOTE — Progress Notes (Addendum)
 PROGRESS NOTE    [name]  FMW:[id number] DOB: 1941/05/20 DOA: 03/11/2024 PCP: Patti Mliss LABOR, FNP   Brief Narrative:  [name] is a 83 y.o. female who presents from home with worsening mental status accompanied by family, husband, who indicates she was previously well with no prior complaints.  Patient had bilateral ureteroscopy with laser lithotripsy stone extraction with left ureteral stent exchange and right ureteral stent placement.  Patient recovered well and ultimately discharged home from procedure.  While at home patient's mental status continued to deteriorate and husband brought her to the hospital for further evaluation and treatment.  In the ED patient noted to be febrile tachycardic hypotensive meeting sepsis criteria given presumed infection of UTI with recent instrumentation.  Foley placed at intake per urology request, antibiotics initiated with broad-spectrum coverage.  Assessment & Plan:   Principal Problem:   Bacteremia due to Enterococcus Active Problems:   COPD (chronic obstructive pulmonary disease) (HCC)   Memory loss   UTI (urinary tract infection)   Sepsis (HCC)   Acute encephalopathy   ARF (acute renal failure) (HCC)   Anemia  Sepsis secondary to UTI, PO Enterococcus bacteremia - Recent bilateral ureteral stent placements 820 1:25 AM with laser lithotripsy - Disseminated infection likely present prior to procedure given immediately positive blood cultures only hours after procedure. - Narrow antibiotics to ampicillin  per infectious disease - Blood culture positive for Enterococcus  Acute metabolic encephalopathy secondary to sepsis. Appears to be improving slowly, follow with family given history of vascular dementia unclear baseline at this time.  Acute kidney injury  Unlikely CKD 2 - Complicated by recent hydronephrosis/obstructing nephrolithiasis, recent stent placements and urological procedure.  - Foley can be removed prior to  discharge, stents will remain - Baseline creatinine earlier this year 0.7 -more recently 1.1-1.4; elevation over the last month likely secondary to above, not her true baseline. - Creatinine continues to downtrend appropriately - Continue IV fluids, advance diet as tolerated, follow repeat labs  Hypocalcemia  - Acute on chronic, likely at least partially hemodilutional -continue to follow, no signs or symptoms of clinical hypocalcemia  History of dementia takes donepezil , memantine - continue  Anemia, normocytic/chronic, likely anemia chronic disease given above  - No acute signs or symptoms of bleeding, follow repeat labs - Iron panel pending  Prior history of TIA takes aspirin . COPD noted in the chart presently not wheezing.  DVT prophylaxis: heparin  injection 5,000 Units Start: 03/12/24 1015 Code Status:   Code Status: Full Code  Family Communication: None present  Status is: Inpatient  Dispo: The patient is from: Home              Anticipated d/c is to: Home              Anticipated d/c date is: To be determined              Patient currently not medically stable for discharge  Consultants:  Urology  Procedures:  None  Antimicrobials:  Ampicillin   Subjective: No acute issues or events overnight., feels markedly better today, noted episodes of diarrhea overnight, ongoing, pending rectal tube placement by staff this morning  Objective: Vitals:   03/12/24 2100 03/12/24 2255 03/12/24 2331 03/13/24 0355  BP:  (!) 112/47 (!) 108/40 (!) 102/49  Pulse:  (!) 106 95 80  Resp:   16 20  Temp: (!) 100.5 F (38.1 C) (!) 103 F (39.4 C) (!) 101.9 F (38.8 C) 97.8 F (36.6 C)  TempSrc:  Oral Oral Oral  SpO2:  93% 93% 95%  Weight:      Height:        Intake/Output Summary (Last 24 hours) at 03/13/2024 0759 Last data filed at 03/13/2024 0700 Gross per 24 hour  Intake 1480.85 ml  Output 1100 ml  Net 380.85 ml   Filed Weights   03/11/24 1726 03/11/24 2159 03/12/24 0500   Weight: 42.6 kg 45.9 kg 45.6 kg    Examination:  General:  Pleasantly resting in bed, No acute distress.  Alert to person place and situation HEENT:  Normocephalic atraumatic.  Sclerae nonicteric, noninjected.  Extraocular movements intact bilaterally. Neck:  Without mass or deformity.  Trachea is midline. Lungs:  Clear to auscultate bilaterally without rhonchi, wheeze, or rales. Heart:  Regular rate and rhythm.  Without murmurs, rubs, or gallops. Abdomen:  Soft, minimally tender lower abdomen midline, nondistended.  Without guarding or rebound. Extremities: Without cyanosis, clubbing, edema, or obvious deformity. Skin:  Warm and dry, no erythema.  Data Reviewed: I have personally reviewed following labs and imaging studies  CBC: Recent Labs  Lab 03/09/24 1350 03/11/24 1748 03/11/24 1753 03/11/24 2231 03/12/24 0525 03/13/24 0507  WBC 7.5 16.1*  --  24.9* 24.1* 11.1*  NEUTROABS  --  15.1*  --   --   --   --   HGB 10.4* 10.0* 10.9* 9.7* 9.4* 8.3*  HCT 33.2* 30.9* 32.0* 29.3* 27.4* 25.4*  MCV 94.6 94.5  --  92.4 90.7 92.4  PLT 330 296  --  266 267 239   Basic Metabolic Panel: Recent Labs  Lab 03/09/24 1350 03/11/24 1748 03/11/24 1753 03/11/24 2231 03/12/24 0525 03/13/24 0507  NA 137 139 139  --  137 138  K 3.9 3.7 4.0  --  3.4* 3.4*  CL 102 108  --   --  106 110  CO2 26 23  --   --  24 25  GLUCOSE 106* 94  --   --  131* 114*  BUN 26* 21  --   --  20 18  CREATININE 0.94 1.02*  --  1.25* 1.20* 1.09*  CALCIUM 9.3 7.3*  --   --  8.1* 7.9*   GFR: Estimated Creatinine Clearance: 24.8 mL/min (A) (by C-G formula based on SCr of 1.09 mg/dL (H)).  Liver Function Tests: Recent Labs  Lab 03/11/24 1748 03/12/24 0525  AST 22 25  ALT 28 13  ALKPHOS 49 42  BILITOT 0.8 0.4  PROT 5.4* 5.6*  ALBUMIN 2.4* 2.4*   Cardiac Enzymes: Recent Labs  Lab 03/11/24 1748  CKTOTAL 86   CBG: Recent Labs  Lab 03/12/24 0725 03/12/24 1217 03/12/24 1553 03/12/24 2353  03/13/24 0731  GLUCAP 138* 151* 111* 155* 110*    Thyroid  Function Tests: Recent Labs    03/11/24 1748  TSH 1.381   Sepsis Labs: Recent Labs  Lab 03/11/24 1754  LATICACIDVEN 1.4    Recent Results (from the past 240 hours)  Blood culture (routine x 2)     Status: None (Preliminary result)   Collection Time: 03/11/24  7:05 PM   Specimen: BLOOD  Result Value Ref Range Status   Specimen Description BLOOD LEFT ANTECUBITAL  Final   Special Requests   Final    BOTTLES DRAWN AEROBIC AND ANAEROBIC Blood Culture adequate volume   Culture  Setup Time   Final    GRAM POSITIVE COCCI IN CHAINS ANAEROBIC BOTTLE ONLY CRITICAL RESULT CALLED TO, READ BACK BY AND VERIFIED WITH: PHARMD MENDEL  BROVEY ON 03/12/24 @ 1351 BY DRT Performed at [facility], [address], [place], [place] [address]    Culture GRAM POSITIVE COCCI IN CHAINS  Final   Report Status PENDING  Incomplete  Blood Culture ID Panel (Reflexed)     Status: Abnormal   Collection Time: 03/11/24  7:05 PM  Result Value Ref Range Status   Enterococcus faecalis DETECTED (A) NOT DETECTED Final    Comment: CRITICAL RESULT CALLED TO, READ BACK BY AND VERIFIED WITH: PHARMD KARLEN BROVEY ON 03/12/24 @ 1351 BY DRT    Enterococcus Faecium NOT DETECTED NOT DETECTED Final   Listeria monocytogenes NOT DETECTED NOT DETECTED Final   Staphylococcus species NOT DETECTED NOT DETECTED Final   Staphylococcus aureus (BCID) NOT DETECTED NOT DETECTED Final   Staphylococcus epidermidis NOT DETECTED NOT DETECTED Final   Staphylococcus lugdunensis NOT DETECTED NOT DETECTED Final   Streptococcus species NOT DETECTED NOT DETECTED Final   Streptococcus agalactiae NOT DETECTED NOT DETECTED Final   Streptococcus pneumoniae NOT DETECTED NOT DETECTED Final   Streptococcus pyogenes NOT DETECTED NOT DETECTED Final   A.calcoaceticus-baumannii NOT DETECTED NOT DETECTED Final   Bacteroides fragilis NOT DETECTED NOT DETECTED Final   Enterobacterales NOT  DETECTED NOT DETECTED Final   Enterobacter cloacae complex NOT DETECTED NOT DETECTED Final   Escherichia coli NOT DETECTED NOT DETECTED Final   Klebsiella aerogenes NOT DETECTED NOT DETECTED Final   Klebsiella oxytoca NOT DETECTED NOT DETECTED Final   Klebsiella pneumoniae NOT DETECTED NOT DETECTED Final   Proteus species NOT DETECTED NOT DETECTED Final   Salmonella species NOT DETECTED NOT DETECTED Final   Serratia marcescens NOT DETECTED NOT DETECTED Final   Haemophilus influenzae NOT DETECTED NOT DETECTED Final   Neisseria meningitidis NOT DETECTED NOT DETECTED Final   Pseudomonas aeruginosa NOT DETECTED NOT DETECTED Final   Stenotrophomonas maltophilia NOT DETECTED NOT DETECTED Final   Candida albicans NOT DETECTED NOT DETECTED Final   Candida auris NOT DETECTED NOT DETECTED Final   Candida glabrata NOT DETECTED NOT DETECTED Final   Candida krusei NOT DETECTED NOT DETECTED Final   Candida parapsilosis NOT DETECTED NOT DETECTED Final   Candida tropicalis NOT DETECTED NOT DETECTED Final   Cryptococcus neoformans/gattii NOT DETECTED NOT DETECTED Final   Vancomycin  resistance NOT DETECTED NOT DETECTED Final    Comment: Performed at [facility], [address], [place], [place] [address]  Blood culture (routine x 2)     Status: None (Preliminary result)   Collection Time: 03/11/24  7:09 PM   Specimen: BLOOD  Result Value Ref Range Status   Specimen Description BLOOD RIGHT ANTECUBITAL  Final   Special Requests   Final    BOTTLES DRAWN AEROBIC AND ANAEROBIC Blood Culture results may not be optimal due to an inadequate volume of blood received in culture bottles   Culture   Final    NO GROWTH < 24 HOURS Performed at [facility], [address], [place], [place] [address]    Report Status PENDING  Incomplete         Radiology Studies: DG Chest Portable 1 View Result Date: 03/11/2024 CLINICAL DATA:  Fever. EXAM: PORTABLE CHEST 1 VIEW COMPARISON:  Chest radiograph  dated 01/16/2024. FINDINGS: No focal consolidation, pleural effusion or pneumothorax. The cardiac silhouette is within normal limits. Atherosclerotic calcification of the aorta. No acute osseous pathology. IMPRESSION: No active disease. Electronically Signed   By: Vanetta Chou M.D.   On: 03/11/2024 18:06   DG  C-Arm 1-60 Min-No Report Result Date: 03/11/2024 Fluoroscopy was utilized by the requesting physician.  No radiographic interpretation.   DG C-Arm 1-60 Min-No Report Result Date: 03/11/2024 Fluoroscopy was utilized by the requesting physician.  No radiographic interpretation.        Scheduled Meds:  artificial tears  1 drop Both Eyes q morning   Chlorhexidine  Gluconate Cloth  6 each Topical Daily   donepezil   10 mg Oral q morning   escitalopram   20 mg Oral q morning   heparin  injection (subcutaneous)  5,000 Units Subcutaneous Q8H   memantine   5 mg Oral BID   mirabegron  ER  50 mg Oral q morning   Continuous Infusions:  ampicillin  (OMNIPEN) IV Stopped (03/13/24 0544)     LOS: 2 days   Time spent:  Elsie JAYSON [place], DO [facility]  If 7PM-7AM, please contact night-coverage [email or web address]  03/13/2024, 7:59 AM

## 2024-03-14 DIAGNOSIS — G9340 Encephalopathy, unspecified: Secondary | ICD-10-CM | POA: Diagnosis not present

## 2024-03-14 DIAGNOSIS — A419 Sepsis, unspecified organism: Secondary | ICD-10-CM | POA: Diagnosis not present

## 2024-03-14 DIAGNOSIS — N179 Acute kidney failure, unspecified: Secondary | ICD-10-CM | POA: Diagnosis not present

## 2024-03-14 DIAGNOSIS — N3001 Acute cystitis with hematuria: Secondary | ICD-10-CM | POA: Diagnosis not present

## 2024-03-14 LAB — GASTROINTESTINAL PANEL BY PCR, STOOL (REPLACES STOOL CULTURE)

## 2024-03-14 LAB — CULTURE, BLOOD (ROUTINE X 2): Special Requests: ADEQUATE

## 2024-03-14 LAB — GLUCOSE, CAPILLARY
Glucose-Capillary: 125 mg/dL — ABNORMAL HIGH (ref 70–99)
Glucose-Capillary: 112 mg/dL — ABNORMAL HIGH (ref 70–99)

## 2024-03-14 NOTE — Progress Notes (Signed)
 03/14/24 @ 05:11 Oral temp 101-2 tylenol  administered

## 2024-03-14 NOTE — Progress Notes (Signed)
 PROGRESS NOTE    [name]  FMW:[id number] DOB: 1940/10/22 DOA: 03/11/2024 PCP: Patti Mliss LABOR, FNP   Brief Narrative:  [name] is a 83 y.o. female who presents from home with worsening mental status after recent bilateral ureteroscopy with laser lithotripsy stone extraction with left ureteral stent exchange and right ureteral stent placement.  Patient has known comorbid conditions including vascular dementia, prior TIA, chronic anemia of chronic disease, COPD without oxygen. In the ED patient noted to be febrile tachycardic hypotensive meeting sepsis criteria given presumed infection of UTI with recent instrumentation.  Foley placed at intake per urology request, antibiotics initiated with broad-spectrum coverage.  Assessment & Plan:   Principal Problem:   Bacteremia due to Enterococcus Active Problems:   COPD (chronic obstructive pulmonary disease) (HCC)   Memory loss   UTI (urinary tract infection)   Sepsis (HCC)   Acute encephalopathy   ARF (acute renal failure) (HCC)   Anemia  Sepsis secondary to UTI, PO Enterococcus bacteremia - Recent bilateral ureteral stent placements 820 1:25 AM with laser lithotripsy - Disseminated infection likely present prior to procedure secondary to infected nephrolithiasis. - Narrow antibiotics to ampicillin  per infectious disease - Blood culture positive for Enterococcus - TTE - 60/65% without notable vegetation - will likely need TEE in the next few days to evaluate for valvular lesions.  Acute metabolic encephalopathy secondary to sepsis, resolved Appears to be improving slowly, follow with family given history of vascular dementia unclear baseline at this time.  Acute kidney injury  Unlikely CKD 2 - Complicated by recent hydronephrosis/obstructing nephrolithiasis, recent stent placements and urological procedure.  - Foley removed, stents will remain in place for now - Baseline creatinine earlier this year 0.7 -more recently  1.1-1.4; elevation over the last month likely secondary to above, not her true baseline. - Creatinine continues to downtrend appropriately - Continue IV fluids, advance diet as tolerated, follow repeat labs  Hypocalcemia - Acute on chronic, likely at least partially hemodilutional -continue to follow, no signs or symptoms of clinical hypocalcemia  History of dementia takes donepezil , memantine - continue  Anemia, normocytic/chronic, likely anemia chronic disease given above  - No acute signs or symptoms of bleeding, follow repeat labs - Iron panel consistent with mixed chronic disease anemia as well as for iron deficiency given normal ferritin  Prior history of TIA takes aspirin . COPD noted in the chart presently not wheezing.  DVT prophylaxis: heparin  injection 5,000 Units Start: 03/12/24 1015 Code Status:   Code Status: Full Code  Family Communication: None present  Status is: Inpatient  Dispo: The patient is from: Home              Anticipated d/c is to: Home              Anticipated d/c date is: To be determined              Patient currently not medically stable for discharge  Consultants:  Urology  Procedures:  None  Antimicrobials:  Ampicillin   Subjective: No acute issues or events overnight., feels moderately improved over the past 24 hours, denies nausea vomiting constipation headache fevers chills or chest pain.  Objective: Vitals:   03/13/24 1711 03/13/24 2038 03/13/24 2355 03/14/24 0511  BP: (!) 133/59 (!) 115/47 122/62 (!) 133/53  Pulse: 78 82 79 79  Resp: 18 16 18 19   Temp: 99.5 F (37.5 C) 98 F (36.7 C) 98.4 F (36.9 C) (!) 101 F (38.3 C)  TempSrc:  Oral  Oral Oral  SpO2: 96% 92% 95% 91%  Weight:      Height:        Intake/Output Summary (Last 24 hours) at 03/14/2024 0733 Last data filed at 03/13/2024 2038 Gross per 24 hour  Intake 1291 ml  Output 400 ml  Net 891 ml   Filed Weights   03/11/24 1726 03/11/24 2159 03/12/24 0500  Weight: 42.6  kg 45.9 kg 45.6 kg    Examination:  General:  Pleasantly resting in bed, No acute distress.  Alert to person place and situation HEENT:  Normocephalic atraumatic.  Sclerae nonicteric, noninjected.  Extraocular movements intact bilaterally. Neck:  Without mass or deformity.  Trachea is midline. Lungs:  Clear to auscultate bilaterally without rhonchi, wheeze, or rales. Heart:  Regular rate and rhythm.  Without murmurs, rubs, or gallops. Abdomen:  Soft, nontender, nondistended.  Without guarding or rebound. Extremities: Without cyanosis, clubbing, edema, or obvious deformity. Skin:  Warm and dry, no erythema.  Data Reviewed: I have personally reviewed following labs and imaging studies  CBC: Recent Labs  Lab 03/09/24 1350 03/11/24 1748 03/11/24 1753 03/11/24 2231 03/12/24 0525 03/13/24 0507  WBC 7.5 16.1*  --  24.9* 24.1* 11.1*  NEUTROABS  --  15.1*  --   --   --   --   HGB 10.4* 10.0* 10.9* 9.7* 9.4* 8.3*  HCT 33.2* 30.9* 32.0* 29.3* 27.4* 25.4*  MCV 94.6 94.5  --  92.4 90.7 92.4  PLT 330 296  --  266 267 239   Basic Metabolic Panel: Recent Labs  Lab 03/09/24 1350 03/11/24 1748 03/11/24 1753 03/11/24 2231 03/12/24 0525 03/13/24 0507  NA 137 139 139  --  137 138  K 3.9 3.7 4.0  --  3.4* 3.4*  CL 102 108  --   --  106 110  CO2 26 23  --   --  24 25  GLUCOSE 106* 94  --   --  131* 114*  BUN 26* 21  --   --  20 18  CREATININE 0.94 1.02*  --  1.25* 1.20* 1.09*  CALCIUM 9.3 7.3*  --   --  8.1* 7.9*   GFR: Estimated Creatinine Clearance: 24.8 mL/min (A) (by C-G formula based on SCr of 1.09 mg/dL (H)).  Liver Function Tests: Recent Labs  Lab 03/11/24 1748 03/12/24 0525  AST 22 25  ALT 28 13  ALKPHOS 49 42  BILITOT 0.8 0.4  PROT 5.4* 5.6*  ALBUMIN 2.4* 2.4*   Cardiac Enzymes: Recent Labs  Lab 03/11/24 1748  CKTOTAL 86   CBG: Recent Labs  Lab 03/12/24 2353 03/13/24 0731 03/13/24 1140 03/13/24 1557 03/13/24 2354  GLUCAP 155* 110* 126* 170* 140*     Thyroid  Function Tests: Recent Labs    03/11/24 1748  TSH 1.381   Sepsis Labs: Recent Labs  Lab 03/11/24 1754  LATICACIDVEN 1.4    Recent Results (from the past 240 hours)  Blood culture (routine x 2)     Status: Abnormal (Preliminary result)   Collection Time: 03/11/24  7:05 PM   Specimen: BLOOD  Result Value Ref Range Status   Specimen Description BLOOD LEFT ANTECUBITAL  Final   Special Requests   Final    BOTTLES DRAWN AEROBIC AND ANAEROBIC Blood Culture adequate volume   Culture  Setup Time   Final    GRAM POSITIVE COCCI IN CHAINS ANAEROBIC BOTTLE ONLY CRITICAL RESULT CALLED TO, READ BACK BY AND VERIFIED WITH: PHARMD KARLEN BROVEY ON 03/12/24 @ 1351  BY DRT    Culture (A)  Final    ENTEROCOCCUS FAECALIS SUSCEPTIBILITIES TO FOLLOW Performed at [facility], [address], [place], [place] [address]    Report Status PENDING  Incomplete  Blood Culture ID Panel (Reflexed)     Status: Abnormal   Collection Time: 03/11/24  7:05 PM  Result Value Ref Range Status   Enterococcus faecalis DETECTED (A) NOT DETECTED Final    Comment: CRITICAL RESULT CALLED TO, READ BACK BY AND VERIFIED WITH: PHARMD KARLEN BROVEY ON 03/12/24 @ 1351 BY DRT    Enterococcus Faecium NOT DETECTED NOT DETECTED Final   Listeria monocytogenes NOT DETECTED NOT DETECTED Final   Staphylococcus species NOT DETECTED NOT DETECTED Final   Staphylococcus aureus (BCID) NOT DETECTED NOT DETECTED Final   Staphylococcus epidermidis NOT DETECTED NOT DETECTED Final   Staphylococcus lugdunensis NOT DETECTED NOT DETECTED Final   Streptococcus species NOT DETECTED NOT DETECTED Final   Streptococcus agalactiae NOT DETECTED NOT DETECTED Final   Streptococcus pneumoniae NOT DETECTED NOT DETECTED Final   Streptococcus pyogenes NOT DETECTED NOT DETECTED Final   A.calcoaceticus-baumannii NOT DETECTED NOT DETECTED Final   Bacteroides fragilis NOT DETECTED NOT DETECTED Final   Enterobacterales NOT DETECTED NOT  DETECTED Final   Enterobacter cloacae complex NOT DETECTED NOT DETECTED Final   Escherichia coli NOT DETECTED NOT DETECTED Final   Klebsiella aerogenes NOT DETECTED NOT DETECTED Final   Klebsiella oxytoca NOT DETECTED NOT DETECTED Final   Klebsiella pneumoniae NOT DETECTED NOT DETECTED Final   Proteus species NOT DETECTED NOT DETECTED Final   Salmonella species NOT DETECTED NOT DETECTED Final   Serratia marcescens NOT DETECTED NOT DETECTED Final   Haemophilus influenzae NOT DETECTED NOT DETECTED Final   Neisseria meningitidis NOT DETECTED NOT DETECTED Final   Pseudomonas aeruginosa NOT DETECTED NOT DETECTED Final   Stenotrophomonas maltophilia NOT DETECTED NOT DETECTED Final   Candida albicans NOT DETECTED NOT DETECTED Final   Candida auris NOT DETECTED NOT DETECTED Final   Candida glabrata NOT DETECTED NOT DETECTED Final   Candida krusei NOT DETECTED NOT DETECTED Final   Candida parapsilosis NOT DETECTED NOT DETECTED Final   Candida tropicalis NOT DETECTED NOT DETECTED Final   Cryptococcus neoformans/gattii NOT DETECTED NOT DETECTED Final   Vancomycin  resistance NOT DETECTED NOT DETECTED Final    Comment: Performed at [facility], [address] [place], [place] [address]  Blood culture (routine x 2)     Status: None (Preliminary result)   Collection Time: 03/11/24  7:09 PM   Specimen: BLOOD  Result Value Ref Range Status   Specimen Description BLOOD RIGHT ANTECUBITAL  Final   Special Requests   Final    BOTTLES DRAWN AEROBIC AND ANAEROBIC Blood Culture results may not be optimal due to an inadequate volume of blood received in culture bottles   Culture  Setup Time   Final    GRAM POSITIVE COCCI IN CHAINS IN PAIRS AEROBIC BOTTLE ONLY Performed at [facility], [address] [place], [place] [address]    Culture GRAM POSITIVE COCCI  Final   Report Status PENDING  Incomplete  Urine Culture     Status: Abnormal   Collection Time: 03/12/24  4:45 AM   Specimen:  Urine, Clean Catch  Result Value Ref Range Status   Specimen Description URINE, CLEAN CATCH  Final   Special Requests NONE  Final   Culture (A)  Final    <10,000 COLONIES/mL INSIGNIFICANT GROWTH Performed at [facility], [address] [place], [place] [address]    Report Status 03/13/2024 FINAL  Final  Culture, blood (Routine X 2) w Reflex to ID Panel     Status: None (Preliminary result)   Collection Time: 03/13/24  5:07 AM   Specimen: BLOOD  Result Value Ref Range Status   Specimen Description BLOOD BLOOD LEFT ARM  Final   Special Requests   Final    BOTTLES DRAWN AEROBIC AND ANAEROBIC Blood Culture adequate volume   Culture   Final    NO GROWTH < 12 HOURS Performed at [facility], [address] [place], [place] [address]    Report Status PENDING  Incomplete  C Difficile Quick Screen w PCR reflex     Status: None   Collection Time: 03/13/24 10:48 AM   Specimen: Stool  Result Value Ref Range Status   C Diff antigen NEGATIVE NEGATIVE Final   C Diff toxin NEGATIVE NEGATIVE Final   C Diff interpretation No C. difficile detected.  Final    Comment: Performed at [facility], [address] [place], [place] [address]         Radiology Studies: ECHOCARDIOGRAM COMPLETE Result Date: 03/13/2024    ECHOCARDIOGRAM REPORT   Patient Name:   [name] Date of Exam: 03/13/2024 Medical Rec #:  [id number]       Height:       58.0 in Accession #:    [id number]      Weight:       100.5 lb Date of Birth:  [birth year]       BSA:          1.359 m Patient Age:    83 years        BP:           124/77 mmHg Patient Gender: F               HR:           85 bpm. Exam Location:  Inpatient Procedure: 2D Echo, Cardiac Doppler and Color Doppler (Both Spectral and Color            Flow Doppler were utilized during procedure). Indications:    Bacteremia  History:        Patient has prior history of Echocardiogram examinations, most                 recent 12/26/2023. COPD and Stroke;  Risk Factors:Current Smoker                 and CKD.  Sonographer:    Logan Shove RDCS Referring Phys: CORDELLA JONETTA JULY IMPRESSIONS  1. Left ventricular ejection fraction, by estimation, is 60 to 65%. Left ventricular ejection fraction by 2D MOD biplane is 64.0 %. The left ventricle has normal function. The left ventricle has no regional wall motion abnormalities. Left ventricular diastolic parameters are consistent with Grade I diastolic dysfunction (impaired relaxation).  2. Right ventricular systolic function is normal. The right ventricular size is normal. There is normal pulmonary artery systolic pressure. The estimated right ventricular systolic pressure is 24.9 mmHg.  3. Left atrial size was moderately dilated.  4. Right atrial size was moderately dilated.  5. The mitral valve is abnormal. Mild mitral valve regurgitation.  6. The aortic valve is tricuspid. Aortic valve regurgitation is not visualized.  7. The inferior vena cava is normal in size with greater than 50% respiratory variability, suggesting right atrial pressure  of 3 mmHg. Comparison(s): Changes from prior study are noted. 12/26/2023: LVEF 65-70%, mild LAE, moderate RAE, trivial MR, moderate TR. Conclusion(s)/Recommendation(s): No evidence of valvular vegetations on this transthoracic echocardiogram. Consider a transesophageal echocardiogram to exclude infective endocarditis if clinically indicated. FINDINGS  Left Ventricle: Left ventricular ejection fraction, by estimation, is 60 to 65%. Left ventricular ejection fraction by 2D MOD biplane is 64.0 %. The left ventricle has normal function. The left ventricle has no regional wall motion abnormalities. The left ventricular internal cavity size was normal in size. There is no left ventricular hypertrophy. Left ventricular diastolic parameters are consistent with Grade I diastolic dysfunction (impaired relaxation). Indeterminate filling pressures. Right Ventricle: The right ventricular size is normal. No  increase in right ventricular wall thickness. Right ventricular systolic function is normal. There is normal pulmonary artery systolic pressure. The tricuspid regurgitant velocity is 2.34 m/s, and  with an assumed right atrial pressure of 3 mmHg, the estimated right ventricular systolic pressure is 24.9 mmHg. Left Atrium: Left atrial size was moderately dilated. Right Atrium: Right atrial size was moderately dilated. Pericardium: There is no evidence of pericardial effusion. Mitral Valve: The mitral valve is abnormal. There is mild thickening of the anterior and posterior mitral valve leaflet(s). Mild mitral valve regurgitation, with posteriorly-directed jet. Tricuspid Valve: The tricuspid valve is grossly normal. Tricuspid valve regurgitation is trivial. Aortic Valve: The aortic valve is tricuspid. Aortic valve regurgitation is not visualized. Aortic valve mean gradient measures 4.0 mmHg. Aortic valve peak gradient measures 7.5 mmHg. Aortic valve area, by VTI measures 2.64 cm. Pulmonic Valve: The pulmonic valve was grossly normal. Pulmonic valve regurgitation is trivial. Aorta: The aortic root and ascending aorta are structurally normal, with no evidence of dilitation. Venous: The inferior vena cava is normal in size with greater than 50% respiratory variability, suggesting right atrial pressure of 3 mmHg. IAS/Shunts: No atrial level shunt detected by color flow Doppler.  LEFT VENTRICLE PLAX 2D                        Biplane EF (MOD) LVIDd:         4.70 cm         LV Biplane EF:   Left LVIDs:         2.90 cm                          ventricular LV PW:         0.70 cm                          ejection LV IVS:        0.80 cm                          fraction by LVOT diam:     2.00 cm                          2D MOD LV SV:         69                               biplane is LV SV Index:   51  64.0 %. LVOT Area:     3.14 cm                                Diastology                                 LV e' medial:    11.00 cm/s LV Volumes (MOD)               LV E/e' medial:  9.7 LV vol d, MOD    69.2 ml       LV e' lateral:   11.10 cm/s A2C:                           LV E/e' lateral: 9.6 LV vol d, MOD    75.6 ml A4C: LV vol s, MOD    25.2 ml A2C: LV vol s, MOD    27.0 ml A4C: LV SV MOD A2C:   44.0 ml LV SV MOD A4C:   75.6 ml LV SV MOD BP:    47.0 ml RIGHT VENTRICLE             IVC RV Basal diam:  3.30 cm     IVC diam: 1.60 cm RV S prime:     17.50 cm/s TAPSE (M-mode): 3.2 cm LEFT ATRIUM             Index        RIGHT ATRIUM           Index LA diam:        2.50 cm 1.84 cm/m   RA Area:     18.20 cm LA Vol (A2C):   77.4 ml 56.93 ml/m  RA Volume:   54.70 ml  40.24 ml/m LA Vol (A4C):   50.1 ml 36.85 ml/m LA Biplane Vol: 67.8 ml 49.87 ml/m  AORTIC VALVE AV Area (Vmax):    2.36 cm AV Area (Vmean):   2.45 cm AV Area (VTI):     2.64 cm AV Vmax:           137.00 cm/s AV Vmean:          90.900 cm/s AV VTI:            0.261 m AV Peak Grad:      7.5 mmHg AV Mean Grad:      4.0 mmHg LVOT Vmax:         103.00 cm/s LVOT Vmean:        71.000 cm/s LVOT VTI:          0.219 m LVOT/AV VTI ratio: 0.84  AORTA Ao Root diam: 2.60 cm Ao Asc diam:  2.90 cm MITRAL VALVE                TRICUSPID VALVE MV Area (PHT): 3.53 cm     TR Peak grad:   21.9 mmHg MV Decel Time: 215 msec     TR Vmax:        234.00 cm/s MV E velocity: 107.00 cm/s MV A velocity: 103.00 cm/s  SHUNTS MV E/A ratio:  1.04         Systemic VTI:  0.22 m  Systemic Diam: 2.00 cm Vinie Maxcy MD Electronically signed by Vinie Maxcy MD Signature Date/Time: 03/13/2024/3:52:35 PM    Final         Scheduled Meds:  artificial tears  1 drop Both Eyes q morning   Chlorhexidine  Gluconate Cloth  6 each Topical Daily   donepezil   10 mg Oral q morning   escitalopram   20 mg Oral q morning   feeding supplement  237 mL Oral BID BM   Gerhardt's butt cream   Topical BID   heparin  injection (subcutaneous)  5,000 Units Subcutaneous Q8H    memantine   5 mg Oral BID   mirabegron  ER  50 mg Oral q morning   nicotine   21 mg Transdermal Daily   Continuous Infusions:  ampicillin  (OMNIPEN) IV 2 g (03/14/24 0527)     LOS: 3 days   Time spent:  Elsie JAYSON Montclair, DO [facility]  If 7PM-7AM, please contact night-coverage [email or web address]  03/14/2024, 7:33 AM

## 2024-03-14 NOTE — Evaluation (Signed)
 Physical Therapy Evaluation Patient Details Name: [name] MRN: [id number] DOB: April 30, 1941 Today's Date: 03/14/2024  History of Present Illness  Pt is an 83 y.o. female admitted 8/21 had scheduled Cystoscopy & basket stone extraction at [facility], then in PM pt became lethargic, vomited, and was in un-airconditioned car ~ 15 minutes. Workup for sepsis. Recent admission for AMS d/t Enterococcus bacteremia  PMH: vascular dementia, UTI, DDD, COPD   Clinical Impression  Pt in bed upon arrival of PT, agreeable to evaluation at this time. Prior to admission the pt was independent with all mobility, living at home with her spouse who is available 24/7. The pt was able to complete bed mobility with min-modA and sit-stand transfers with minA of 2. She currently presents with deficits in strength, stability, and activity tolerance, but anticipate will be able to make progress with continued skilled PT acutely and use of DME. Mobility was most limited this session by incontinence of bowel and bladder with standing, will continue to progress mobility and independence to allow for safest return home with spouse.      If plan is discharge home, recommend the following: A lot of help with walking and/or transfers;A lot of help with bathing/dressing/bathroom;Assistance with cooking/housework;Assist for transportation;Help with stairs or ramp for entrance   Can travel by private vehicle        Equipment Recommendations Rolling walker (2 wheels)  Recommendations for Other Services       Functional Status Assessment Patient has had a recent decline in their functional status and demonstrates the ability to make significant improvements in function in a reasonable and predictable amount of time.     Precautions / Restrictions Precautions Precautions: Fall Recall of Precautions/Restrictions: Intact Precaution/Restrictions Comments: Rectal tube, rectal strings from recent procedure Restrictions Weight  Bearing Restrictions Per Provider Order: No      Mobility  Bed Mobility Overal bed mobility: Needs Assistance Bed Mobility: Supine to Sit, Sit to Supine     Supine to sit: Mod assist, HOB elevated, Used rails, +2 for physical assistance Sit to supine: Min assist   General bed mobility comments: Light mod assist +2 for legs and trunk, to return to supine min assist for BLEs    Transfers Overall transfer level: Needs assistance Equipment used: 2 person hand held assist Transfers: Sit to/from Stand Sit to Stand: Min assist, +2 physical assistance, +2 safety/equipment           General transfer comment: Min +2 HH assist to come  to stand. Unable to progress to transfers d/t incontinence      Balance Overall balance assessment: Needs assistance Sitting-balance support: Bilateral upper extremity supported, Feet supported Sitting balance-Leahy Scale: Poor Sitting balance - Comments: Min to CGA for sitting EOB, often with a posterior lean Postural control: Posterior lean Standing balance support: Bilateral upper extremity supported, During functional activity Standing balance-Leahy Scale: Poor Standing balance comment: Reliant on UE support, no buckling                             Pertinent Vitals/Pain Pain Assessment Pain Assessment: No/denies pain    Home Living Family/patient expects to be discharged to:: Private residence Living Arrangements: Spouse/significant other Available Help at Discharge: Family;Available 24 hours/day Type of Home: House Home Access: Stairs to enter Entrance Stairs-Rails: Right Entrance Stairs-Number of Steps: 2   Home Layout: One level Home Equipment: Grab bars - tub/shower;Shower seat;Rollator (4 wheels) Additional Comments: Pt lives with  husband who is retired    Prior Function Prior Level of Function : Independent/Modified Independent             Mobility Comments: No AD, 1x fall in 09/2023, finished HH approx 1x week  ago ADLs Comments: Ind, mostly supervision for shower transfers     Extremity/Trunk Assessment   Upper Extremity Assessment Upper Extremity Assessment: Defer to OT evaluation    Lower Extremity Assessment Lower Extremity Assessment: Generalized weakness    Cervical / Trunk Assessment Cervical / Trunk Assessment: Normal  Communication   Communication Communication: Impaired Factors Affecting Communication: Hearing impaired    Cognition Arousal: Alert Behavior During Therapy: WFL for tasks assessed/performed   PT - Cognitive impairments: History of cognitive impairments                       PT - Cognition Comments: hx of vascular dementia, generally able to follow commands Following commands: Intact       Cueing Cueing Techniques: Verbal cues     General Comments General comments (skin integrity, edema, etc.): Pt incontinent of bowels and bladder, flexiseal dislodged, RN notified and present at end of session    Exercises     Assessment/Plan    PT Assessment Patient needs continued PT services  PT Problem List Decreased strength;Decreased balance;Decreased mobility;Decreased cognition       PT Treatment Interventions DME instruction;Gait training;Stair training;Functional mobility training;Therapeutic activities;Therapeutic exercise;Balance training;Patient/family education    PT Goals (Current goals can be found in the Care Plan section)  Acute Rehab PT Goals Patient Stated Goal: return home PT Goal Formulation: With patient/family Time For Goal Achievement: 03/28/24 Potential to Achieve Goals: Good    Frequency Min 2X/week     Co-evaluation   Reason for Co-Treatment: Complexity of the patient's impairments (multi-system involvement);To address functional/ADL transfers;For patient/therapist safety   OT goals addressed during session: ADL's and self-care       AM-PAC PT 6 Clicks Mobility  Outcome Measure Help needed turning from your  back to your side while in a flat bed without using bedrails?: A Little Help needed moving from lying on your back to sitting on the side of a flat bed without using bedrails?: A Little Help needed moving to and from a bed to a chair (including a wheelchair)?: A Lot Help needed standing up from a chair using your arms (e.g., wheelchair or bedside chair)?: A Lot Help needed to walk in hospital room?: Total Help needed climbing 3-5 steps with a railing? : Total 6 Click Score: 12    End of Session Equipment Utilized During Treatment: Gait belt Activity Tolerance: Patient tolerated treatment well;Other (comment) (limited by repeated incontinence) Patient left: in bed;with [name] bell/phone within reach;with bed alarm set;with family/visitor present;with nursing/sitter in room Nurse Communication: Mobility status PT Visit Diagnosis: Unsteadiness on feet (R26.81);Muscle weakness (generalized) (M62.81)    Time: 8664-8585 PT Time Calculation (min) (ACUTE ONLY): 39 min   Charges:   PT Evaluation $PT Eval Moderate Complexity: 1 Mod   PT General Charges $$ ACUTE PT VISIT: 1 Visit         [name], PT, DPT   Acute Rehabilitation Department Office [phone] Secure Chat Communication Preferred  [name] 03/14/2024, 5:43 PM

## 2024-03-14 NOTE — Plan of Care (Signed)

## 2024-03-14 NOTE — Evaluation (Signed)
 Occupational Therapy Evaluation Patient Details Name: [name] MRN: [id number] DOB: 11/04/1940 Today's Date: 03/14/2024   History of Present Illness   Pt is an 83 y.o. female admitted 8/21 had scheduled Cystoscopy & basket stone extraction at [facility], then in PM pt became lethargic, vomited, and was in un-airconditioned car ~ 15 minutes. Workup for sepsis. Recent admission for AMS d/t Enterococcus bacteremia  PMH: vascular dementia, UTI, DDD, COPD     Clinical Impressions Pt admitted based on above, and was seen based on problem list below. PTA pt was independent with ADLs, spouse assisting with IADLs. Today pt is requiring set up  to min assist for most ADLs. Pt total assist for toileting d/t incontience of bowel and bladder, and rectal tube dislodging during session. RN notified. Able to complete STS with min assist +2 assist. Pt from home with very supportive spouse, anticipate pt would do well and benefit from [facility]. OT will continue to follow acutely to maximize functional independence.     If plan is discharge home, recommend the following:   A lot of help with walking and/or transfers;A little help with bathing/dressing/bathroom     Functional Status Assessment   Patient has had a recent decline in their functional status and demonstrates the ability to make significant improvements in function in a reasonable and predictable amount of time.     Equipment Recommendations   BSC/3in1      Precautions/Restrictions   Precautions Precautions: Fall Recall of Precautions/Restrictions: Intact Precaution/Restrictions Comments: Rectal tube, rectal strings from recent procedure Restrictions Weight Bearing Restrictions Per Provider Order: No     Mobility Bed Mobility Overal bed mobility: Needs Assistance Bed Mobility: Supine to Sit, Sit to Supine     Supine to sit: Mod assist, HOB elevated, Used rails, +2 for physical assistance Sit to supine: Min assist   General  bed mobility comments: Light mod assist +2 for legs and trunk, to return to supine min assist for BLEs    Transfers Overall transfer level: Needs assistance Equipment used: 2 person hand held assist Transfers: Sit to/from Stand Sit to Stand: Min assist, +2 physical assistance, +2 safety/equipment           General transfer comment: Min +2 HH assist to come  to stand. Unable to progress to transfers d/t incontinence      Balance Overall balance assessment: Needs assistance Sitting-balance support: Bilateral upper extremity supported, Feet supported Sitting balance-Leahy Scale: Poor Sitting balance - Comments: Min to CGA for sitting EOB, often with a posterior lean Postural control: Posterior lean Standing balance support: Bilateral upper extremity supported, During functional activity Standing balance-Leahy Scale: Poor Standing balance comment: Reliant on UE support         ADL either performed or assessed with clinical judgement   ADL Overall ADL's : Needs assistance/impaired Eating/Feeding: Set up;Sitting   Grooming: Set up;Sitting           Upper Body Dressing : Set up;Sitting   Lower Body Dressing: Minimal assistance;Sit to/from stand Lower Body Dressing Details (indicate cue type and reason): Pt able to don socks seated EOB. Min assist to pull pants above waist     Toileting- Clothing Manipulation and Hygiene: Total assistance;Sit to/from stand Toileting - Clothing Manipulation Details (indicate cue type and reason): Incontinent of bowels and bladder. Rectal tube       General ADL Comments: Min +2 to stand, not able to attempt transfer d/t incontience     Vision Baseline Vision/History: 1 Wears glasses  Patient Visual Report: No change from baseline Vision Assessment?: No apparent visual deficits            Pertinent Vitals/Pain Pain Assessment Pain Assessment: No/denies pain     Extremity/Trunk Assessment Upper Extremity Assessment Upper  Extremity Assessment: Generalized weakness   Lower Extremity Assessment Lower Extremity Assessment: Defer to PT evaluation   Cervical / Trunk Assessment Cervical / Trunk Assessment: Normal   Communication Communication Communication: Impaired Factors Affecting Communication: Hearing impaired   Cognition Arousal: Alert Behavior During Therapy: WFL for tasks assessed/performed Cognition: History of cognitive impairments         OT - Cognition Comments: Pt with hx of vascular dementia, overall WFL       Following commands: Intact       Cueing  General Comments   Cueing Techniques: Verbal cues  Pt incontient of bowels and bladder, rectal tube dislodged, RN notified and present at end of session           Home Living Family/patient expects to be discharged to:: Private residence Living Arrangements: Spouse/significant other Available Help at Discharge: Family;Available 24 hours/day Type of Home: House Home Access: Stairs to enter [employer] of Steps: 2 Entrance Stairs-Rails: Right Home Layout: One level     Bathroom Shower/Tub: Chief Strategy Officer: Standard Bathroom Accessibility: No   Home Equipment: Grab bars - tub/shower;Shower seat;Rollator (4 wheels)          Prior Functioning/Environment Prior Level of Function : Independent/Modified Independent             Mobility Comments: No AD, 1x fall in 09/2023, finished HH approx 1x week ago ADLs Comments: Ind, mostly supervision for shower transfers    OT Problem List: Decreased strength;Decreased range of motion;Decreased activity tolerance;Impaired balance (sitting and/or standing);Decreased safety awareness;Decreased knowledge of use of DME or AE;Cardiopulmonary status limiting activity   OT Treatment/Interventions: Self-care/ADL training;Therapeutic exercise;Energy conservation;DME and/or AE instruction;Therapeutic activities;Patient/family education;Balance training       OT Goals(Current goals can be found in the care plan section)   Acute Rehab OT Goals Patient Stated Goal: To get better OT Goal Formulation: With patient Time For Goal Achievement: 03/28/24 Potential to Achieve Goals: Good   OT Frequency:  Min 2X/week    Co-evaluation PT/OT/SLP Co-Evaluation/Treatment: Yes Reason for Co-Treatment: Complexity of the patient's impairments (multi-system involvement);To address functional/ADL transfers;For patient/therapist safety   OT goals addressed during session: ADL's and self-care      AM-PAC OT 6 Clicks Daily Activity     Outcome Measure Help from another person eating meals?: None Help from another person taking care of personal grooming?: A Little Help from another person toileting, which includes using toliet, bedpan, or urinal?: Total Help from another person bathing (including washing, rinsing, drying)?: A Little Help from another person to put on and taking off regular upper body clothing?: A Little Help from another person to put on and taking off regular lower body clothing?: A Little 6 Click Score: 17   End of Session Nurse Communication: Mobility status;Other (comment) (Rectal tube)  Activity Tolerance: Patient tolerated treatment well Patient left: in bed;with call bell/phone within reach;with nursing/sitter in room;with family/visitor present  OT Visit Diagnosis: Unsteadiness on feet (R26.81);Other abnormalities of gait and mobility (R26.89);Muscle weakness (generalized) (M62.81)                Time: 8664-8587 OT Time Calculation (min): 37 min Charges:  OT General Charges $OT Visit: 1 Visit OT Evaluation $OT Eval Moderate Complexity:  1 Mod  [name] C, OT  Acute Rehabilitation Services Office [phone] Secure chat preferred   [name] Savers 03/14/2024, 3:09 PM

## 2024-03-14 NOTE — Progress Notes (Signed)
 Flexi-seal removed accidentally during PT evaluation.   Rectal pouch ordered. Patient bathed and fresh linen was provided.

## 2024-03-15 DIAGNOSIS — R7881 Bacteremia: Secondary | ICD-10-CM | POA: Diagnosis not present

## 2024-03-15 DIAGNOSIS — B952 Enterococcus as the cause of diseases classified elsewhere: Secondary | ICD-10-CM | POA: Diagnosis not present

## 2024-03-15 LAB — BASIC METABOLIC PANEL WITH GFR
Anion gap: 5 (ref 5–15)
BUN: 18 mg/dL (ref 8–23)
CO2: 25 mmol/L (ref 22–32)
Calcium: 8.3 mg/dL — ABNORMAL LOW (ref 8.9–10.3)
Chloride: 108 mmol/L (ref 98–111)
Creatinine, Ser: 0.86 mg/dL (ref 0.44–1.00)
GFR, Estimated: >60 mL/min (ref >60)
Glucose, Bld: 113 mg/dL — ABNORMAL HIGH (ref 70–99)
Potassium: 3.2 mmol/L — ABNORMAL LOW (ref 3.5–5.1)
Sodium: 138 mmol/L (ref 135–145)

## 2024-03-15 LAB — CBC
HCT: 23.6 % — ABNORMAL LOW (ref 36.0–46.0)
Hemoglobin: 7.9 g/dL — ABNORMAL LOW (ref 12.0–15.0)
MCH: 30.7 pg (ref 26.0–34.0)
MCHC: 33.5 g/dL (ref 30.0–36.0)
MCV: 91.8 fL (ref 80.0–100.0)
Platelets: 234 K/uL (ref 150–400)
RBC: 2.57 MIL/uL — ABNORMAL LOW (ref 3.87–5.11)
RDW: 14.6 % (ref 11.5–15.5)
WBC: 10.3 K/uL (ref 4.0–10.5)
nRBC: 0 % (ref 0.0–0.2)

## 2024-03-15 LAB — CULTURE, BLOOD (ROUTINE X 2): Culture  Setup Time: NO GROWTH

## 2024-03-15 NOTE — Progress Notes (Signed)
 Mobility Specialist Progress Note;   03/15/24 1042  Mobility  Activity  (Bed level exercises)  Range of Motion/Exercises Active Assistive;Right leg;Left leg  Activity Response Tolerated fair  Mobility Referral Yes  Mobility visit 1 Mobility  Mobility Specialist Start Time (ACUTE ONLY) 1042  Mobility Specialist Stop Time (ACUTE ONLY) 1050  Mobility Specialist Time Calculation (min) (ACUTE ONLY) 8 min   Pt declining any OOB mobility although heavily encouraged d/t feeling lazy. Encouraged pt to sit up in chair, or standing at EoB to perform marches in place, pt deferring all. Agreeable to limited bed level exercises, actively assisting pt in BLE exercises. VSS throughout. Pt left with all needs met.   Lauraine Erm Mobility Specialist Please contact via SecureChat or [employer] [phone]

## 2024-03-15 NOTE — Plan of Care (Signed)
  Problem: Education: Goal: Knowledge of General Education information will improve Description: Including pain rating scale, medication(s)/side effects and non-pharmacologic comfort measures Outcome: Progressing  Problem: Nutrition: Goal: Adequate nutrition will be maintained Outcome: Progressing   Problem: Elimination: Goal: Will not experience complications related to bowel motility Outcome: Progressing   Problem: Elimination: Goal: Will not experience complications related to urinary retention Outcome: Progressing   Problem: Safety: Goal: Ability to remain free from injury will improve Outcome: Progressing   Problem: Pain Managment: Goal: General experience of comfort will improve and/or be controlled Outcome: Progressing   Problem: Skin Integrity: Goal: Risk for impaired skin integrity will decrease Outcome: Progressing

## 2024-03-15 NOTE — Plan of Care (Signed)
   Problem: Health Behavior/Discharge Planning: Goal: Ability to manage health-related needs will improve Outcome: Progressing   Problem: Nutrition: Goal: Adequate nutrition will be maintained Outcome: Progressing

## 2024-03-15 NOTE — Progress Notes (Signed)
 [facility]  Date of Admission:  03/11/2024     Reason for Follow Up: Bacteremia due to Enterococcus  Total days of antibiotics 5         ASSESSMENT:  [name] is an 83 y/o caucasian female with vascular dementia and recent Enterococcal bacteremia with urinary source s/p treatment in March 2025 presenting with acute onset altered mental status and fever following a bilateral stent placement and lithotripsy for stone removal and found to have Enterococcus faecalis bacteremia.   [name] blood culture from 03/13/24 is without growth to date and clinically is more alert today although continuing to have fevers.  Discussed plan of care to continue with current dose of ampicillin . Given the acute onset of infection would make bacterial endocarditis less likely with low DENOVA score and will forgo TEE unless fevers continue. Monitor blood cultures until finalized. Will need to monitor fever curve to ensure source control achieved and consider additional imaging if needed. Remaining medical and supportive care per Internal Medicine.   PLAN:  Continue current dose of ampicillin . Monitor blood cultures for continued clearance of bacteremia.  Hold off on TEE given current low risk. Consider additional imaging if fevers continue.  Remaining medical and supportive care per Internal Medicine.   Principal Problem:   Bacteremia due to Enterococcus Active Problems:   COPD (chronic obstructive pulmonary disease) (HCC)   Memory loss   UTI (urinary tract infection)   Sepsis (HCC)   Acute encephalopathy   ARF (acute renal failure) (HCC)   Anemia    artificial tears  1 drop Both Eyes q morning   Chlorhexidine  Gluconate Cloth  6 each Topical Daily   donepezil   10 mg Oral q morning   escitalopram   20 mg Oral q morning   feeding supplement  237 mL Oral BID BM   Gerhardt's butt cream   Topical BID   heparin  injection (subcutaneous)  5,000 Units Subcutaneous Q8H    memantine   5 mg Oral BID   mirabegron  ER  50 mg Oral q morning   nicotine   21 mg Transdermal Daily    SUBJECTIVE:  Afebrile overnight with no acute events. Asking when she will be able to go home. Tolerating antibiotics with no adverse side effects. Husband at bedside.   Allergies  Allergen Reactions   Sulfa Antibiotics Itching and Rash     Review of Systems: Review of Systems  Unable to perform ROS: Dementia      OBJECTIVE: Vitals:   03/14/24 2356 03/15/24 0100 03/15/24 0357 03/15/24 1158  BP: (!) 129/47  (!) 124/50 (!) 118/50  Pulse: 78  71 77  Resp: 17  16 16   Temp: (!) 101.1 F (38.4 C) (!) 100.9 F (38.3 C) 98.3 F (36.8 C) 98.7 F (37.1 C)  TempSrc: Oral  Oral Oral  SpO2: 95%  95% 92%  Weight:      Height:       Body mass index is 21.01 kg/m.  Physical Exam Constitutional:      General: She is not in acute distress.    Appearance: She is well-developed.  Cardiovascular:     Rate and Rhythm: Normal rate and regular rhythm.     Heart sounds: Normal heart sounds.  Pulmonary:     Effort: Pulmonary effort is normal.     Breath sounds: Normal breath sounds.  Skin:    General: Skin is warm and dry.  Neurological:     Mental Status: She is  alert. She is disoriented.     Lab Results Lab Results  Component Value Date   WBC 10.3 03/15/2024   HGB 7.9 (L) 03/15/2024   HCT 23.6 (L) 03/15/2024   MCV 91.8 03/15/2024   PLT 234 03/15/2024    Lab Results  Component Value Date   CREATININE 0.86 03/15/2024   BUN 18 03/15/2024   NA 138 03/15/2024   K 3.2 (L) 03/15/2024   CL 108 03/15/2024   CO2 25 03/15/2024    Lab Results  Component Value Date   ALT 13 03/12/2024   AST 25 03/12/2024   ALKPHOS 42 03/12/2024   BILITOT 0.4 03/12/2024     Microbiology: Recent Results (from the past 240 hours)  Blood culture (routine x 2)     Status: Abnormal   Collection Time: 03/11/24  7:05 PM   Specimen: BLOOD  Result Value Ref Range Status   Specimen  Description BLOOD LEFT ANTECUBITAL  Final   Special Requests   Final    BOTTLES DRAWN AEROBIC AND ANAEROBIC Blood Culture adequate volume   Culture  Setup Time   Final    GRAM POSITIVE COCCI IN CHAINS ANAEROBIC BOTTLE ONLY CRITICAL RESULT CALLED TO, READ BACK BY AND VERIFIED WITH: PHARMD KARLEN BROVEY ON 03/12/24 @ 1351 BY DRT Performed at [facility], [address], [place], [place] [address]    Culture ENTEROCOCCUS FAECALIS (A)  Final   Report Status 03/14/2024 FINAL  Final   Organism ID, Bacteria ENTEROCOCCUS FAECALIS  Final      Susceptibility   Enterococcus faecalis - MIC*    AMPICILLIN  <=2 SENSITIVE Sensitive     VANCOMYCIN  1 SENSITIVE Sensitive     GENTAMICIN SYNERGY SENSITIVE Sensitive     * ENTEROCOCCUS FAECALIS  Blood Culture ID Panel (Reflexed)     Status: Abnormal   Collection Time: 03/11/24  7:05 PM  Result Value Ref Range Status   Enterococcus faecalis DETECTED (A) NOT DETECTED Final    Comment: CRITICAL RESULT CALLED TO, READ BACK BY AND VERIFIED WITH: PHARMD KARLEN BROVEY ON 03/12/24 @ 1351 BY DRT    Enterococcus Faecium NOT DETECTED NOT DETECTED Final   Listeria monocytogenes NOT DETECTED NOT DETECTED Final   Staphylococcus species NOT DETECTED NOT DETECTED Final   Staphylococcus aureus (BCID) NOT DETECTED NOT DETECTED Final   Staphylococcus epidermidis NOT DETECTED NOT DETECTED Final   Staphylococcus lugdunensis NOT DETECTED NOT DETECTED Final   Streptococcus species NOT DETECTED NOT DETECTED Final   Streptococcus agalactiae NOT DETECTED NOT DETECTED Final   Streptococcus pneumoniae NOT DETECTED NOT DETECTED Final   Streptococcus pyogenes NOT DETECTED NOT DETECTED Final   A.calcoaceticus-baumannii NOT DETECTED NOT DETECTED Final   Bacteroides fragilis NOT DETECTED NOT DETECTED Final   Enterobacterales NOT DETECTED NOT DETECTED Final   Enterobacter cloacae complex NOT DETECTED NOT DETECTED Final   Escherichia coli NOT DETECTED NOT DETECTED Final    Klebsiella aerogenes NOT DETECTED NOT DETECTED Final   Klebsiella oxytoca NOT DETECTED NOT DETECTED Final   Klebsiella pneumoniae NOT DETECTED NOT DETECTED Final   Proteus species NOT DETECTED NOT DETECTED Final   Salmonella species NOT DETECTED NOT DETECTED Final   Serratia marcescens NOT DETECTED NOT DETECTED Final   Haemophilus influenzae NOT DETECTED NOT DETECTED Final   Neisseria meningitidis NOT DETECTED NOT DETECTED Final   Pseudomonas aeruginosa NOT DETECTED NOT DETECTED Final   Stenotrophomonas maltophilia NOT DETECTED NOT DETECTED Final   Candida albicans NOT DETECTED NOT DETECTED Final   Candida  auris NOT DETECTED NOT DETECTED Final   Candida glabrata NOT DETECTED NOT DETECTED Final   Candida krusei NOT DETECTED NOT DETECTED Final   Candida parapsilosis NOT DETECTED NOT DETECTED Final   Candida tropicalis NOT DETECTED NOT DETECTED Final   Cryptococcus neoformans/gattii NOT DETECTED NOT DETECTED Final   Vancomycin  resistance NOT DETECTED NOT DETECTED Final    Comment: Performed at [facility], [address], [place], [place] [address]  Blood culture (routine x 2)     Status: Abnormal   Collection Time: 03/11/24  7:09 PM   Specimen: BLOOD  Result Value Ref Range Status   Specimen Description BLOOD RIGHT ANTECUBITAL  Final   Special Requests   Final    BOTTLES DRAWN AEROBIC AND ANAEROBIC Blood Culture results may not be optimal due to an inadequate volume of blood received in culture bottles   Culture  Setup Time   Final    GRAM POSITIVE COCCI IN CHAINS IN PAIRS AEROBIC BOTTLE ONLY CRITICAL VALUE NOTED.  VALUE IS CONSISTENT WITH PREVIOUSLY REPORTED AND CALLED VALUE.    Culture (A)  Final    ENTEROCOCCUS FAECALIS SUSCEPTIBILITIES PERFORMED ON PREVIOUS CULTURE WITHIN THE LAST 5 DAYS. Performed at [facility], [address] [place], [place] [address]    Report Status 03/15/2024 FINAL  Final  Urine Culture     Status: Abnormal   Collection Time: 03/12/24   4:45 AM   Specimen: Urine, Clean Catch  Result Value Ref Range Status   Specimen Description URINE, CLEAN CATCH  Final   Special Requests NONE  Final   Culture (A)  Final    <10,000 COLONIES/mL INSIGNIFICANT GROWTH Performed at [facility], [address] [place], [place] [address]    Report Status 03/13/2024 FINAL  Final  Culture, blood (Routine X 2) w Reflex to ID Panel     Status: None (Preliminary result)   Collection Time: 03/13/24  5:07 AM   Specimen: BLOOD  Result Value Ref Range Status   Specimen Description BLOOD BLOOD LEFT ARM  Final   Special Requests   Final    BOTTLES DRAWN AEROBIC AND ANAEROBIC Blood Culture adequate volume   Culture   Final    NO GROWTH 2 DAYS Performed at [facility], [address] [place], [place] [address]    Report Status PENDING  Incomplete  C Difficile Quick Screen w PCR reflex     Status: None   Collection Time: 03/13/24 10:48 AM   Specimen: Stool  Result Value Ref Range Status   C Diff antigen NEGATIVE NEGATIVE Final   C Diff toxin NEGATIVE NEGATIVE Final   C Diff interpretation No C. difficile detected.  Final    Comment: Performed at [facility], [address] [place], [place] [address]  Gastrointestinal Panel by PCR , Stool     Status: None   Collection Time: 03/13/24  2:30 PM   Specimen: Stool  Result Value Ref Range Status   Campylobacter species NOT DETECTED NOT DETECTED Final   Plesimonas shigelloides NOT DETECTED NOT DETECTED Final   Salmonella species NOT DETECTED NOT DETECTED Final   Yersinia enterocolitica NOT DETECTED NOT DETECTED Final   Vibrio species NOT DETECTED NOT DETECTED Final   Vibrio cholerae NOT DETECTED NOT DETECTED Final   Enteroaggregative E coli (EAEC) NOT DETECTED NOT DETECTED Final   Enteropathogenic E coli (EPEC) NOT DETECTED NOT DETECTED Final   Enterotoxigenic E coli (ETEC) NOT DETECTED NOT  DETECTED Final   Shiga like toxin producing E coli (STEC) NOT DETECTED NOT DETECTED  Final   Shigella/Enteroinvasive E coli (EIEC) NOT DETECTED NOT DETECTED Final   Cryptosporidium NOT DETECTED NOT DETECTED Final   Cyclospora cayetanensis NOT DETECTED NOT DETECTED Final   Entamoeba histolytica NOT DETECTED NOT DETECTED Final   Giardia lamblia NOT DETECTED NOT DETECTED Final   Adenovirus F40/41 NOT DETECTED NOT DETECTED Final   Astrovirus NOT DETECTED NOT DETECTED Final   Norovirus GI/GII NOT DETECTED NOT DETECTED Final   Rotavirus A NOT DETECTED NOT DETECTED Final   Sapovirus (I, II, IV, and V) NOT DETECTED NOT DETECTED Final    Comment: Performed at [facility], [address] [place], [place] [address]     Cathlyn July, NP [facility]  03/15/2024  2:10 PM

## 2024-03-15 NOTE — Progress Notes (Signed)
 PROGRESS NOTE    [name]  FMW:[id number] DOB: [birth year] DOA: 03/11/2024 PCP: Patti Mliss LABOR, FNP   Brief Narrative:  This 83 y.o. female who presents from home with worsening mental status after recent bilateral ureteroscopy with laser lithotripsy,  stone extraction with left ureteral stent exchange and right ureteral stent placement.  Patient has known comorbid conditions including vascular dementia, prior TIA, chronic anemia of chronic disease, COPD without oxygen. In the ED patient noted to be febrile,  tachycardic hypotensive , meeting sepsis criteria given presumed infection of UTI with recent instrumentation.  Foley placed at intake per urology request, antibiotics initiated with broad-spectrum coverage.   Assessment & Plan:   Principal Problem:   Bacteremia due to Enterococcus Active Problems:   COPD (chronic obstructive pulmonary disease) (HCC)   Memory loss   UTI (urinary tract infection)   Sepsis (HCC)   Acute encephalopathy   ARF (acute renal failure) (HCC)   Anemia  Sepsis secondary to UTI, POA: Enterococcus bacteremia - Recent bilateral ureteral stent placements on 8/20 1:25 AM with laser lithotripsy - Disseminated infection likely present prior to procedure secondary to infected nephrolithiasis. - Narrowed antibiotics to ampicillin  per infectious disease - Blood culture positive for Enterococcus. - TTE - 60/65% without notable vegetation - will likely need TEE in the next few days to evaluate for valvular lesions.   Acute metabolic encephalopathy secondary to sepsis> resolved Appears to be improving slowly, follow with family given history of vascular dementia,  unclear baseline at this time.  She appears back to her baseline mental status today.   Acute kidney injury: > Resolved. - Complicated by recent hydronephrosis /obstructing nephrolithiasis, recent stent placements and urological procedure.  - Foley removed, stents will remain in place for now -  Baseline creatinine earlier this year 0.7 -more recently 1.1-1.4; elevation over the last month likely secondary to above, not her true baseline. - Creatinine continues to downtrend appropriately - Continue IV fluids, advance diet as tolerated, follow repeat labs.   Hypocalcemia: - Acute on chronic, likely at least partially hemodilutional -Continue to follow, no signs or symptoms of clinical hypocalcemia.   History of dementia: Continue donepezil , memantine .   Chronic normocytic anemia, likely anemia chronic disease given above  - No acute signs or symptoms of bleeding, follow repeat labs - Iron panel consistent with mixed chronic disease anemia as well as for iron deficiency given normal ferritin.   Prior history of TIA : Continue aspirin .  COPD noted in the chart presently not wheezing.  Hypokalemia: Replaced.  Continue to monitor   DVT prophylaxis: Heparin  sq Code Status:Full code Family Communication:No family at bed side. Disposition Plan:  Status is: Inpatient Remains inpatient appropriate because:   Severity of illness   Consultants:  Urology  Procedures:  Antimicrobials:  Anti-infectives (From admission, onward)    Start     Dose/Rate Route Frequency Ordered Stop   03/13/24 2000  vancomycin  (VANCOREADY) IVPB 750 mg/150 mL  Status:  Discontinued        750 mg 150 mL/hr over 60 Minutes Intravenous Every 48 hours 03/11/24 2155 03/12/24 1405   03/12/24 2200  ampicillin  (OMNIPEN) 2 g in sodium chloride  0.9 % 100 mL IVPB        2 g 300 mL/hr over 20 Minutes Intravenous Every 8 hours 03/12/24 1405     03/12/24 0400  piperacillin -tazobactam (ZOSYN ) IVPB 3.375 g  Status:  Discontinued        3.375 g 12.5 mL/hr over 240 Minutes  Intravenous Every 8 hours 03/11/24 2155 03/12/24 1405   03/11/24 1900  vancomycin  (VANCOCIN ) IVPB 1000 mg/200 mL premix        1,000 mg 200 mL/hr over 60 Minutes Intravenous  Once 03/11/24 1845 03/11/24 2135   03/11/24 1830  ampicillin   (OMNIPEN) 1 g in sodium chloride  0.9 % 100 mL IVPB  Status:  Discontinued        1 g 300 mL/hr over 20 Minutes Intravenous  Once 03/11/24 1815 03/11/24 1821   03/11/24 1830  piperacillin -tazobactam (ZOSYN ) IVPB 3.375 g        3.375 g 100 mL/hr over 30 Minutes Intravenous  Once 03/11/24 1821 03/11/24 2020       Subjective: Patient was seen and examined at bedside.  Overnight events noted. Patient denies any other concerns.  Blood cultures are positive,  she may require TEE in couple of days.  Denies any fever, reports feeling improved  Objective: Vitals:   03/14/24 2000 03/14/24 2356 03/15/24 0100 03/15/24 0357  BP:  (!) 129/47  (!) 124/50  Pulse:  78  71  Resp:  17  16  Temp: (!) 102 F (38.9 C) (!) 101.1 F (38.4 C) (!) 100.9 F (38.3 C) 98.3 F (36.8 C)  TempSrc: Oral Oral  Oral  SpO2:  95%  95%  Weight:      Height:        Intake/Output Summary (Last 24 hours) at 03/15/2024 1146 Last data filed at 03/15/2024 0334 Gross per 24 hour  Intake 400 ml  Output --  Net 400 ml   Filed Weights   03/11/24 1726 03/11/24 2159 03/12/24 0500  Weight: 42.6 kg 45.9 kg 45.6 kg    Examination:  General exam: Appears calm and comfortable, severely deconditioned, not in any acute distress. Respiratory system: CTA Bilaterally. Respiratory effort normal. RR 15 Cardiovascular system: S1 & S2 heard, RRR. No JVD, murmurs, rubs, gallops or clicks.  Gastrointestinal system: Abdomen is non distended, soft and non tender. Normal bowel sounds heard. Central nervous system: Alert and oriented x 3. No focal neurological deficits. Extremities: No edema, no cyanosis, no clubbing. Skin: No rashes, lesions or ulcers Psychiatry: Judgement and insight appear normal. Mood & affect appropriate.     Data Reviewed: I have personally reviewed following labs and imaging studies  CBC: Recent Labs  Lab 03/11/24 1748 03/11/24 1753 03/11/24 2231 03/12/24 0525 03/13/24 0507 03/15/24 0533  WBC 16.1*   --  24.9* 24.1* 11.1* 10.3  NEUTROABS 15.1*  --   --   --   --   --   HGB 10.0* 10.9* 9.7* 9.4* 8.3* 7.9*  HCT 30.9* 32.0* 29.3* 27.4* 25.4* 23.6*  MCV 94.5  --  92.4 90.7 92.4 91.8  PLT 296  --  266 267 239 234   Basic Metabolic Panel: Recent Labs  Lab 03/09/24 1350 03/11/24 1748 03/11/24 1753 03/11/24 2231 03/12/24 0525 03/13/24 0507 03/15/24 0533  NA 137 139 139  --  137 138 138  K 3.9 3.7 4.0  --  3.4* 3.4* 3.2*  CL 102 108  --   --  106 110 108  CO2 26 23  --   --  24 25 25   GLUCOSE 106* 94  --   --  131* 114* 113*  BUN 26* 21  --   --  20 18 18   CREATININE 0.94 1.02*  --  1.25* 1.20* 1.09* 0.86  CALCIUM 9.3 7.3*  --   --  8.1* 7.9* 8.3*  GFR: Estimated Creatinine Clearance: 31.4 mL/min (by C-G formula based on SCr of 0.86 mg/dL). Liver Function Tests: Recent Labs  Lab 03/11/24 1748 03/12/24 0525  AST 22 25  ALT 28 13  ALKPHOS 49 42  BILITOT 0.8 0.4  PROT 5.4* 5.6*  ALBUMIN 2.4* 2.4*   No results for input(s): LIPASE, AMYLASE in the last 168 hours. No results for input(s): AMMONIA in the last 168 hours. Coagulation Profile: No results for input(s): INR, PROTIME in the last 168 hours. Cardiac Enzymes: Recent Labs  Lab 03/11/24 1748  CKTOTAL 86   BNP (last 3 results) No results for input(s): PROBNP in the last 8760 hours. HbA1C: No results for input(s): HGBA1C in the last 72 hours. CBG: Recent Labs  Lab 03/13/24 1140 03/13/24 1557 03/13/24 2354 03/14/24 0822 03/14/24 1626  GLUCAP 126* 170* 140* 125* 112*   Lipid Profile: No results for input(s): CHOL, HDL, LDLCALC, TRIG, CHOLHDL, LDLDIRECT in the last 72 hours. Thyroid  Function Tests: No results for input(s): TSH, T4TOTAL, FREET4, T3FREE, THYROIDAB in the last 72 hours. Anemia Panel: Recent Labs    03/13/24 0507  FERRITIN 75  TIBC 244*  IRON 10*   Sepsis Labs: Recent Labs  Lab 03/11/24 1754  LATICACIDVEN 1.4    Recent Results (from the past 240  hours)  Blood culture (routine x 2)     Status: Abnormal   Collection Time: 03/11/24  7:05 PM   Specimen: BLOOD  Result Value Ref Range Status   Specimen Description BLOOD LEFT ANTECUBITAL  Final   Special Requests   Final    BOTTLES DRAWN AEROBIC AND ANAEROBIC Blood Culture adequate volume   Culture  Setup Time   Final    GRAM POSITIVE COCCI IN CHAINS ANAEROBIC BOTTLE ONLY CRITICAL RESULT CALLED TO, READ BACK BY AND VERIFIED WITH: PHARMD KARLEN BROVEY ON 03/12/24 @ 1351 BY DRT Performed at [facility], [address], [place], [place] [address]    Culture ENTEROCOCCUS FAECALIS (A)  Final   Report Status 03/14/2024 FINAL  Final   Organism ID, Bacteria ENTEROCOCCUS FAECALIS  Final      Susceptibility   Enterococcus faecalis - MIC*    AMPICILLIN  <=2 SENSITIVE Sensitive     VANCOMYCIN  1 SENSITIVE Sensitive     GENTAMICIN SYNERGY SENSITIVE Sensitive     * ENTEROCOCCUS FAECALIS  Blood Culture ID Panel (Reflexed)     Status: Abnormal   Collection Time: 03/11/24  7:05 PM  Result Value Ref Range Status   Enterococcus faecalis DETECTED (A) NOT DETECTED Final    Comment: CRITICAL RESULT CALLED TO, READ BACK BY AND VERIFIED WITH: PHARMD KARLEN BROVEY ON 03/12/24 @ 1351 BY DRT    Enterococcus Faecium NOT DETECTED NOT DETECTED Final   Listeria monocytogenes NOT DETECTED NOT DETECTED Final   Staphylococcus species NOT DETECTED NOT DETECTED Final   Staphylococcus aureus (BCID) NOT DETECTED NOT DETECTED Final   Staphylococcus epidermidis NOT DETECTED NOT DETECTED Final   Staphylococcus lugdunensis NOT DETECTED NOT DETECTED Final   Streptococcus species NOT DETECTED NOT DETECTED Final   Streptococcus agalactiae NOT DETECTED NOT DETECTED Final   Streptococcus pneumoniae NOT DETECTED NOT DETECTED Final   Streptococcus pyogenes NOT DETECTED NOT DETECTED Final   A.calcoaceticus-baumannii NOT DETECTED NOT DETECTED Final   Bacteroides fragilis NOT DETECTED NOT DETECTED Final    Enterobacterales NOT DETECTED NOT DETECTED Final   Enterobacter cloacae complex NOT DETECTED NOT DETECTED Final   Escherichia coli NOT DETECTED NOT DETECTED Final   Klebsiella  aerogenes NOT DETECTED NOT DETECTED Final   Klebsiella oxytoca NOT DETECTED NOT DETECTED Final   Klebsiella pneumoniae NOT DETECTED NOT DETECTED Final   Proteus species NOT DETECTED NOT DETECTED Final   Salmonella species NOT DETECTED NOT DETECTED Final   Serratia marcescens NOT DETECTED NOT DETECTED Final   Haemophilus influenzae NOT DETECTED NOT DETECTED Final   Neisseria meningitidis NOT DETECTED NOT DETECTED Final   Pseudomonas aeruginosa NOT DETECTED NOT DETECTED Final   Stenotrophomonas maltophilia NOT DETECTED NOT DETECTED Final   Candida albicans NOT DETECTED NOT DETECTED Final   Candida auris NOT DETECTED NOT DETECTED Final   Candida glabrata NOT DETECTED NOT DETECTED Final   Candida krusei NOT DETECTED NOT DETECTED Final   Candida parapsilosis NOT DETECTED NOT DETECTED Final   Candida tropicalis NOT DETECTED NOT DETECTED Final   Cryptococcus neoformans/gattii NOT DETECTED NOT DETECTED Final   Vancomycin  resistance NOT DETECTED NOT DETECTED Final    Comment: Performed at [facility], [address] [place], [place] [address]  Blood culture (routine x 2)     Status: None (Preliminary result)   Collection Time: 03/11/24  7:09 PM   Specimen: BLOOD  Result Value Ref Range Status   Specimen Description BLOOD RIGHT ANTECUBITAL  Final   Special Requests   Final    BOTTLES DRAWN AEROBIC AND ANAEROBIC Blood Culture results may not be optimal due to an inadequate volume of blood received in culture bottles   Culture  Setup Time   Final    GRAM POSITIVE COCCI IN CHAINS IN PAIRS AEROBIC BOTTLE ONLY CRITICAL VALUE NOTED.  VALUE IS CONSISTENT WITH PREVIOUSLY REPORTED AND CALLED VALUE. Performed at [facility], [address] [place], [place] [address]    Culture GRAM POSITIVE COCCI  Final   Report  Status PENDING  Incomplete  Urine Culture     Status: Abnormal   Collection Time: 03/12/24  4:45 AM   Specimen: Urine, Clean Catch  Result Value Ref Range Status   Specimen Description URINE, CLEAN CATCH  Final   Special Requests NONE  Final   Culture (A)  Final    <10,000 COLONIES/mL INSIGNIFICANT GROWTH Performed at [facility], [address], [place], [place] [address]    Report Status 03/13/2024 FINAL  Final  Culture, blood (Routine X 2) w Reflex to ID Panel     Status: None (Preliminary result)   Collection Time: 03/13/24  5:07 AM   Specimen: BLOOD  Result Value Ref Range Status   Specimen Description BLOOD BLOOD LEFT ARM  Final   Special Requests   Final    BOTTLES DRAWN AEROBIC AND ANAEROBIC Blood Culture adequate volume   Culture   Final    NO GROWTH 2 DAYS Performed at [facility], [address] [place], [place] [address]    Report Status PENDING  Incomplete  C Difficile Quick Screen w PCR reflex     Status: None   Collection Time: 03/13/24 10:48 AM   Specimen: Stool  Result Value Ref Range Status   C Diff antigen NEGATIVE NEGATIVE Final   C Diff toxin NEGATIVE NEGATIVE Final   C Diff interpretation No C. difficile detected.  Final    Comment: Performed at [facility], [address] [place], [place] [address]  Gastrointestinal Panel by PCR , Stool     Status: None   Collection Time: 03/13/24  2:30 PM   Specimen: Stool  Result Value Ref Range  Status   Campylobacter species NOT DETECTED NOT DETECTED Final   Plesimonas shigelloides NOT DETECTED NOT DETECTED Final   Salmonella species NOT DETECTED NOT DETECTED Final   Yersinia enterocolitica NOT DETECTED NOT DETECTED Final   Vibrio species NOT DETECTED NOT DETECTED Final   Vibrio cholerae NOT DETECTED NOT DETECTED Final   Enteroaggregative E coli (EAEC) NOT DETECTED NOT DETECTED Final   Enteropathogenic E coli (EPEC) NOT DETECTED NOT DETECTED Final   Enterotoxigenic E coli (ETEC) NOT DETECTED  NOT DETECTED Final   Shiga like toxin producing E coli (STEC) NOT DETECTED NOT DETECTED Final   Shigella/Enteroinvasive E coli (EIEC) NOT DETECTED NOT DETECTED Final   Cryptosporidium NOT DETECTED NOT DETECTED Final   Cyclospora cayetanensis NOT DETECTED NOT DETECTED Final   Entamoeba histolytica NOT DETECTED NOT DETECTED Final   Giardia lamblia NOT DETECTED NOT DETECTED Final   Adenovirus F40/41 NOT DETECTED NOT DETECTED Final   Astrovirus NOT DETECTED NOT DETECTED Final   Norovirus GI/GII NOT DETECTED NOT DETECTED Final   Rotavirus A NOT DETECTED NOT DETECTED Final   Sapovirus (I, II, IV, and V) NOT DETECTED NOT DETECTED Final    Comment: Performed at [facility], [address], [place], [place] [address]    Radiology Studies: ECHOCARDIOGRAM COMPLETE Result Date: 03/13/2024    ECHOCARDIOGRAM REPORT   Patient Name:   KORI GOINS Date of Exam: 03/13/2024 Medical Rec #:  [id number]       Height:       58.0 in Accession #:    [id number]      Weight:       100.5 lb Date of Birth:  12/28/1940       BSA:          1.359 m Patient Age:    83 years        BP:           124/77 mmHg Patient Gender: F               HR:           85 bpm. Exam Location:  Inpatient Procedure: 2D Echo, Cardiac Doppler and Color Doppler (Both Spectral and Color            Flow Doppler were utilized during procedure). Indications:    Bacteremia  History:        Patient has prior history of Echocardiogram examinations, most                 recent 12/26/2023. COPD and Stroke; Risk Factors:Current Smoker                 and CKD.  Sonographer:    Logan Shove RDCS Referring Phys: CORDELLA JONETTA JULY IMPRESSIONS  1. Left ventricular ejection fraction, by estimation, is 60 to 65%. Left ventricular ejection fraction by 2D MOD biplane is 64.0 %. The left ventricle has normal function. The left ventricle has no regional wall motion abnormalities. Left ventricular diastolic parameters are consistent with Grade I diastolic dysfunction  (impaired relaxation).  2. Right ventricular systolic function is normal. The right ventricular size is normal. There is normal pulmonary artery systolic pressure. The estimated right ventricular systolic pressure is 24.9 mmHg.  3. Left atrial size was moderately dilated.  4. Right atrial size was moderately dilated.  5. The mitral valve is abnormal. Mild mitral valve regurgitation.  6. The aortic valve is tricuspid. Aortic valve regurgitation is not visualized.  7. The inferior vena cava  is normal in size with greater than 50% respiratory variability, suggesting right atrial pressure of 3 mmHg. Comparison(s): Changes from prior study are noted. 12/26/2023: LVEF 65-70%, mild LAE, moderate RAE, trivial MR, moderate TR. Conclusion(s)/Recommendation(s): No evidence of valvular vegetations on this transthoracic echocardiogram. Consider a transesophageal echocardiogram to exclude infective endocarditis if clinically indicated. FINDINGS  Left Ventricle: Left ventricular ejection fraction, by estimation, is 60 to 65%. Left ventricular ejection fraction by 2D MOD biplane is 64.0 %. The left ventricle has normal function. The left ventricle has no regional wall motion abnormalities. The left ventricular internal cavity size was normal in size. There is no left ventricular hypertrophy. Left ventricular diastolic parameters are consistent with Grade I diastolic dysfunction (impaired relaxation). Indeterminate filling pressures. Right Ventricle: The right ventricular size is normal. No increase in right ventricular wall thickness. Right ventricular systolic function is normal. There is normal pulmonary artery systolic pressure. The tricuspid regurgitant velocity is 2.34 m/s, and  with an assumed right atrial pressure of 3 mmHg, the estimated right ventricular systolic pressure is 24.9 mmHg. Left Atrium: Left atrial size was moderately dilated. Right Atrium: Right atrial size was moderately dilated. Pericardium: There is no  evidence of pericardial effusion. Mitral Valve: The mitral valve is abnormal. There is mild thickening of the anterior and posterior mitral valve leaflet(s). Mild mitral valve regurgitation, with posteriorly-directed jet. Tricuspid Valve: The tricuspid valve is grossly normal. Tricuspid valve regurgitation is trivial. Aortic Valve: The aortic valve is tricuspid. Aortic valve regurgitation is not visualized. Aortic valve mean gradient measures 4.0 mmHg. Aortic valve peak gradient measures 7.5 mmHg. Aortic valve area, by VTI measures 2.64 cm. Pulmonic Valve: The pulmonic valve was grossly normal. Pulmonic valve regurgitation is trivial. Aorta: The aortic root and ascending aorta are structurally normal, with no evidence of dilitation. Venous: The inferior vena cava is normal in size with greater than 50% respiratory variability, suggesting right atrial pressure of 3 mmHg. IAS/Shunts: No atrial level shunt detected by color flow Doppler.  LEFT VENTRICLE PLAX 2D                        Biplane EF (MOD) LVIDd:         4.70 cm         LV Biplane EF:   Left LVIDs:         2.90 cm                          ventricular LV PW:         0.70 cm                          ejection LV IVS:        0.80 cm                          fraction by LVOT diam:     2.00 cm                          2D MOD LV SV:         69                               biplane is LV SV Index:   51  64.0 %. LVOT Area:     3.14 cm                                Diastology                                LV e' medial:    11.00 cm/s LV Volumes (MOD)               LV E/e' medial:  9.7 LV vol d, MOD    69.2 ml       LV e' lateral:   11.10 cm/s A2C:                           LV E/e' lateral: 9.6 LV vol d, MOD    75.6 ml A4C: LV vol s, MOD    25.2 ml A2C: LV vol s, MOD    27.0 ml A4C: LV SV MOD A2C:   44.0 ml LV SV MOD A4C:   75.6 ml LV SV MOD BP:    47.0 ml RIGHT VENTRICLE             IVC RV Basal diam:  3.30 cm     IVC diam: 1.60 cm RV S  prime:     17.50 cm/s TAPSE (M-mode): 3.2 cm LEFT ATRIUM             Index        RIGHT ATRIUM           Index LA diam:        2.50 cm 1.84 cm/m   RA Area:     18.20 cm LA Vol (A2C):   77.4 ml 56.93 ml/m  RA Volume:   54.70 ml  40.24 ml/m LA Vol (A4C):   50.1 ml 36.85 ml/m LA Biplane Vol: 67.8 ml 49.87 ml/m  AORTIC VALVE AV Area (Vmax):    2.36 cm AV Area (Vmean):   2.45 cm AV Area (VTI):     2.64 cm AV Vmax:           137.00 cm/s AV Vmean:          90.900 cm/s AV VTI:            0.261 m AV Peak Grad:      7.5 mmHg AV Mean Grad:      4.0 mmHg LVOT Vmax:         103.00 cm/s LVOT Vmean:        71.000 cm/s LVOT VTI:          0.219 m LVOT/AV VTI ratio: 0.84  AORTA Ao Root diam: 2.60 cm Ao Asc diam:  2.90 cm MITRAL VALVE                TRICUSPID VALVE MV Area (PHT): 3.53 cm     TR Peak grad:   21.9 mmHg MV Decel Time: 215 msec     TR Vmax:        234.00 cm/s MV E velocity: 107.00 cm/s MV A velocity: 103.00 cm/s  SHUNTS MV E/A ratio:  1.04         Systemic VTI:  0.22 m  Systemic Diam: 2.00 cm Vinie Maxcy MD Electronically signed by Vinie Maxcy MD Signature Date/Time: 03/13/2024/3:52:35 PM    Final    Scheduled Meds:  artificial tears  1 drop Both Eyes q morning   Chlorhexidine  Gluconate Cloth  6 each Topical Daily   donepezil   10 mg Oral q morning   escitalopram   20 mg Oral q morning   feeding supplement  237 mL Oral BID BM   Gerhardt's butt cream   Topical BID   heparin  injection (subcutaneous)  5,000 Units Subcutaneous Q8H   memantine   5 mg Oral BID   mirabegron  ER  50 mg Oral q morning   nicotine   21 mg Transdermal Daily   Continuous Infusions:  ampicillin  (OMNIPEN) IV 2 g (03/15/24 0613)     LOS: 4 days    Time spent: 50 mins    Darcel Dawley, MD [facility]   If 7PM-7AM, please contact night-coverage

## 2024-03-16 DIAGNOSIS — B952 Enterococcus as the cause of diseases classified elsewhere: Secondary | ICD-10-CM | POA: Diagnosis not present

## 2024-03-16 DIAGNOSIS — R7881 Bacteremia: Secondary | ICD-10-CM | POA: Diagnosis not present

## 2024-03-16 LAB — PHOSPHORUS: Phosphorus: 3 mg/dL (ref 2.5–4.6)

## 2024-03-16 LAB — GLUCOSE, CAPILLARY
Glucose-Capillary: 118 mg/dL — ABNORMAL HIGH (ref 70–99)
Glucose-Capillary: 146 mg/dL — ABNORMAL HIGH (ref 70–99)

## 2024-03-16 LAB — MAGNESIUM: Magnesium: 1.8 mg/dL (ref 1.7–2.4)

## 2024-03-16 MED ORDER — SODIUM CHLORIDE 0.9 % IV SOLN
2.0000 g | Freq: Four times a day (QID) | INTRAVENOUS | Status: DC
  Administered 2024-03-16 – 2024-03-20 (×16): 2 g via INTRAVENOUS
  Filled 2024-03-16 (×18): qty 2000

## 2024-03-16 NOTE — Progress Notes (Addendum)
 PHARMACY NOTE:  ANTIMICROBIAL RENAL DOSAGE ADJUSTMENT  Current antimicrobial regimen includes a mismatch between antimicrobial dosage and estimated renal function.  As per policy approved by the Pharmacy & Therapeutics and Medical Executive Committees, the antimicrobial dosage will be adjusted accordingly.  Current antimicrobial dosage:  Ampicillin  2g every 8 hours  Indication: Enterococcus faecalis bacteremia  Renal Function: Stable and improving  Estimated Creatinine Clearance: 31.4 mL/min (by C-G formula based on SCr of 0.86 mg/dL).  []      On intermittent HD, scheduled: []      On CRRT    Antimicrobial dosage has been changed to:  Ampicillin  2g every 6 hours   Thank you for allowing pharmacy to be involved with this patient's care.  Mendel Barter, PharmD PGY1 Clinical Pharmacist [facility]  03/16/2024 9:37 AM

## 2024-03-16 NOTE — Significant Event (Signed)
 Call from nursing to report ureteral stent has been displaced and was visible.  Advised to finish removing.  It is unclear which side was pulled.  Will reevaluate and reviewed labs tomorrow morning.

## 2024-03-16 NOTE — Progress Notes (Signed)
 Occupational Therapy Treatment Patient Details Name: [name] MRN: [id number] DOB: 03-08-1941 Today's Date: 03/16/2024   History of present illness Pt is an 83 y.o. female admitted 8/21 had scheduled Cystoscopy & basket stone extraction at [facility], then in PM pt became lethargic, vomited, and was in un-airconditioned car ~ 15 minutes. Workup for sepsis. Recent admission for AMS d/t Enterococcus bacteremia  PMH: vascular dementia, UTI, DDD, COPD   OT comments  Pt presented in bed and was unaware they were covered in loose BM. Nursing was notified and at this time worked on hygiene of UE with set up and cues. Pt was able to reposition in bed to [facility] with mod assist and attempted to assist with set up of breakfast but pt declining to eat anything at this time. Acute Occupational Therapy to follow.       If plan is discharge home, recommend the following:  A lot of help with walking and/or transfers;A little help with bathing/dressing/bathroom   Equipment Recommendations  BSC/3in1    Recommendations for Other Services      Precautions / Restrictions Precautions Precautions: Fall Recall of Precautions/Restrictions: Intact Precaution/Restrictions Comments: Rectal tube, rectal strings from recent procedure Restrictions Weight Bearing Restrictions Per Provider Order: No       Mobility Bed Mobility Overal bed mobility: Needs Assistance             General bed mobility comments: Pt assisted theapist repositioning to [facility] with mod assist. Deffered transfers as rectal tube leaking BM    Transfers                         Balance                                           ADL either performed or assessed with clinical judgement   ADL Overall ADL's : Needs assistance/impaired Eating/Feeding: Set up;Sitting   Grooming: Set up;Sitting   Upper Body Bathing: Set up;Sitting   Lower Body Bathing: Contact guard assist;Bed level Lower Body Bathing Details  (indicate cue type and reason): completed anterior portion of hygiene due to rectal tub leaking Upper Body Dressing : Set up;Sitting                          Extremity/Trunk Assessment Upper Extremity Assessment Upper Extremity Assessment: Generalized weakness   Lower Extremity Assessment Lower Extremity Assessment: Defer to PT evaluation        Vision       Perception     Praxis     Communication Communication Communication: Impaired Factors Affecting Communication: Hearing impaired (pt reporting having hearing aides but did not locate in session)   Cognition Arousal: Alert Behavior During Therapy: WFL for tasks assessed/performed Cognition: History of cognitive impairments             OT - Cognition Comments: Pt with hx of vascular dementia, overall WFL                 Following commands: Intact        Cueing   Cueing Techniques: Verbal cues  Exercises      Shoulder Instructions       General Comments      Pertinent Vitals/ Pain       Pain Assessment Pain Assessment: No/denies pain  Home  Living                                          Prior Functioning/Environment              Frequency  Min 2X/week        Progress Toward Goals  OT Goals(current goals can now be found in the care plan section)  Progress towards OT goals: Progressing toward goals  Acute Rehab OT Goals Patient Stated Goal: none OT Goal Formulation: With patient Time For Goal Achievement: 03/28/24 Potential to Achieve Goals: Good ADL Goals Pt Will Perform Grooming: with supervision;standing Pt Will Perform Lower Body Dressing: with supervision;sit to/from stand Pt Will Transfer to Toilet: with supervision;ambulating;regular height toilet Pt Will Perform Toileting - Clothing Manipulation and hygiene: with supervision;sit to/from stand  Plan      Co-evaluation                 AM-PAC OT 6 Clicks Daily Activity      Outcome Measure   Help from another person eating meals?: None Help from another person taking care of personal grooming?: A Little Help from another person toileting, which includes using toliet, bedpan, or urinal?: Total Help from another person bathing (including washing, rinsing, drying)?: A Little Help from another person to put on and taking off regular upper body clothing?: A Little Help from another person to put on and taking off regular lower body clothing?: A Little 6 Click Score: 17    End of Session    OT Visit Diagnosis: Unsteadiness on feet (R26.81);Other abnormalities of gait and mobility (R26.89);Muscle weakness (generalized) (M62.81)   Activity Tolerance Other (comment) (limited due to [facility])   Patient Left in bed;with call bell/phone within reach;with bed alarm set   Nurse Communication Other (comment) (nursing on bm)        Time: 9184-9149 OT Time Calculation (min): 35 min  Charges: OT General Charges $OT Visit: 1 Visit OT Treatments $Self Care/Home Management : 23-37 mins  Warrick POUR OTR/L  Acute Rehab Services  [phone] office number   Warrick Berber 03/16/2024, 10:22 AM

## 2024-03-16 NOTE — Progress Notes (Signed)
 Physical Therapy Treatment Patient Details Name: [name] MRN: [id number] DOB: 1941-06-07 Today's Date: 03/16/2024   History of Present Illness Pt is an 83 y.o. female admitted 8/21 had scheduled Cystoscopy & basket stone extraction at [facility], then in PM pt became lethargic, vomited, and was in un-airconditioned car ~ 15 minutes. Workup for sepsis. Recent admission for AMS d/t Enterococcus bacteremia  PMH: vascular dementia, UTI, DDD, COPD    PT Comments  Pt agreeable to session, able to make good progress with activity tolerance and mobility despite fatigue. The pt continues to need minA for all mobility due to poor strength, balance, and awareness, but was able to progress to hallway ambulation with use of RW this session. Pt remains eager to progress to home with her spouse, will continue efforts acutely.     If plan is discharge home, recommend the following: A lot of help with bathing/dressing/bathroom;Assistance with cooking/housework;Assist for transportation;Help with stairs or ramp for entrance;A little help with walking and/or transfers   Can travel by private vehicle        Equipment Recommendations  Rolling walker (2 wheels)    Recommendations for Other Services       Precautions / Restrictions Precautions Precautions: Fall Recall of Precautions/Restrictions: Intact Precaution/Restrictions Comments: strings from recent ureteral stent procedure Restrictions Weight Bearing Restrictions Per Provider Order: No     Mobility  Bed Mobility Overal bed mobility: Needs Assistance Bed Mobility: Supine to Sit, Sit to Supine     Supine to sit: HOB elevated, Used rails, Min assist Sit to supine: Min assist   General bed mobility comments: minA to complete with management of lines and covers. increased time    Transfers Overall transfer level: Needs assistance Equipment used: Rolling walker (2 wheels) Transfers: Sit to/from Stand Sit to Stand: Min assist            General transfer comment: minA to rise and steady, cues for hand placement    Ambulation/Gait Ambulation/Gait assistance: Min assist Gait Distance (Feet): 5 Feet (+ 25) Assistive device: Rolling walker (2 wheels) Gait Pattern/deviations: Step-through pattern, Decreased stride length, Trunk flexed, Narrow base of support Gait velocity: decreased Gait velocity interpretation: <1.31 ft/sec, indicative of household ambulator   General Gait Details: small steps with minimal stride length and narrow BOS. no overt LOB but fatigues quickly, increased assist managing RW for turning   Stairs             Wheelchair Mobility     Tilt Bed    Modified Rankin (Stroke Patients Only)       Balance Overall balance assessment: Needs assistance Sitting-balance support: Bilateral upper extremity supported, Feet supported Sitting balance-Leahy Scale: Poor Sitting balance - Comments: CGA for balance sitting EOB   Standing balance support: Bilateral upper extremity supported, During functional activity Standing balance-Leahy Scale: Poor Standing balance comment: Reliant on UE support, no buckling                            Communication Communication Communication: Impaired Factors Affecting Communication: Hearing impaired  Cognition Arousal: Alert Behavior During Therapy: WFL for tasks assessed/performed   PT - Cognitive impairments: History of cognitive impairments                       PT - Cognition Comments: hx of vascular dementia, generally able to follow commands Following commands: Intact      Cueing Cueing Techniques: Verbal cues  Exercises      General Comments General comments (skin integrity, edema, etc.): HR to 90s with exertion, VSS      Pertinent Vitals/Pain Pain Assessment Pain Assessment: No/denies pain Breathing: normal Negative Vocalization: none Facial Expression: smiling or inexpressive Body Language: relaxed Consolability:  no need to console PAINAD Score: 0     PT Goals (current goals can now be found in the care plan section) Acute Rehab PT Goals Patient Stated Goal: return home PT Goal Formulation: With patient/family Time For Goal Achievement: 03/28/24 Potential to Achieve Goals: Good Progress towards PT goals: Progressing toward goals    Frequency    Min 2X/week       AM-PAC PT 6 Clicks Mobility   Outcome Measure  Help needed turning from your back to your side while in a flat bed without using bedrails?: A Little Help needed moving from lying on your back to sitting on the side of a flat bed without using bedrails?: A Little Help needed moving to and from a bed to a chair (including a wheelchair)?: A Little Help needed standing up from a chair using your arms (e.g., wheelchair or bedside chair)?: A Little Help needed to walk in hospital room?: A Lot Help needed climbing 3-5 steps with a railing? : Total 6 Click Score: 15    End of Session Equipment Utilized During Treatment: Gait belt Activity Tolerance: Patient tolerated treatment well Patient left: in bed;with [name] bell/phone within reach;with bed alarm set Nurse Communication: Mobility status PT Visit Diagnosis: Unsteadiness on feet (R26.81);Muscle weakness (generalized) (M62.81)     Time: 8551-8494 PT Time Calculation (min) (ACUTE ONLY): 17 min  Charges:    $Therapeutic Exercise: 8-22 mins PT General Charges $$ ACUTE PT VISIT: 1 Visit                     [name], PT, DPT   Acute Rehabilitation Department Office [phone] Secure Chat Communication Preferred   [name] 03/16/2024, 3:16 PM

## 2024-03-16 NOTE — Progress Notes (Signed)
 PROGRESS NOTE    [name]  FMW:[id number] DOB: 04/25/1941 DOA: 03/11/2024 PCP: Patti Mliss LABOR, FNP   Brief Narrative:  This 83 y.o. female who presents from home with worsening mental status after recent bilateral ureteroscopy with laser lithotripsy,  stone extraction with left ureteral stent exchange and right ureteral stent placement.  Patient has known comorbid conditions including vascular dementia, prior TIA, chronic anemia of chronic disease, COPD without oxygen. In the ED patient noted to be febrile,  tachycardic hypotensive , meeting sepsis criteria given presumed infection of UTI with recent instrumentation.  Foley placed at intake per urology request, antibiotics initiated with broad-spectrum coverage.   Assessment & Plan:   Principal Problem:   Bacteremia due to Enterococcus Active Problems:   COPD (chronic obstructive pulmonary disease) (HCC)   Memory loss   UTI (urinary tract infection)   Sepsis (HCC)   Acute encephalopathy   ARF (acute renal failure) (HCC)   Anemia  Sepsis secondary to UTI, POA: Enterococcus bacteremia - Recent bilateral ureteral stent placements on 8/20  with laser lithotripsy - Disseminated infection likely present prior to procedure secondary to infected nephrolithiasis. - Narrowed antibiotics to ampicillin  per infectious disease - Blood culture positive for Enterococcus. - TTE - 60/65% without notable vegetation - will likely need TEE in the next few days to evaluate for valvular lesions. - ID recommended no plans for TEE at this time unless repeat blood cultures positive.  If she has fever in next 24 to 48 hours may need CT abdomen pelvis or TEE.   Acute metabolic encephalopathy secondary to sepsis> resolved. Appears to be improving slowly, follow with family given history of vascular dementia,  unclear baseline at this time.   She appears back to her baseline mental status.   Acute kidney injury: > Resolved. - Complicated by recent  hydronephrosis /obstructing nephrolithiasis, recent stent placements and urological procedure.  - Foley removed, stents will remain in place for now - Creatinine continues to downtrend appropriately - Continue IV fluids, advance diet as tolerated, follow repeat labs.   Hypocalcemia: - Acute on chronic, likely at least partially hemodilutional  -Continue to follow, no signs or symptoms of clinical hypocalcemia.   History of dementia: Continue donepezil , memantine .   Chronic normocytic anemia, likely anemia chronic disease given above  - No acute signs or symptoms of bleeding, follow repeat labs - Iron panel consistent with mixed chronic disease anemia as well as for iron deficiency given normal ferritin.   Prior history of TIA : Continue aspirin .  COPD noted in the chart presently not wheezing.  Hypokalemia: Replaced.  Continue to monitor   DVT prophylaxis: Heparin  sq Code Status:Full code Family Communication: No family at bed side. Disposition Plan:  Status is: Inpatient Remains inpatient appropriate because:   Severity of illness   Consultants:  Urology  Procedures:  Antimicrobials:  Anti-infectives (From admission, onward)    Start     Dose/Rate Route Frequency Ordered Stop   03/16/24 1200  ampicillin  (OMNIPEN) 2 g in sodium chloride  0.9 % 100 mL IVPB        2 g 300 mL/hr over 20 Minutes Intravenous Every 6 hours 03/16/24 0915     03/13/24 2000  vancomycin  (VANCOREADY) IVPB 750 mg/150 mL  Status:  Discontinued        750 mg 150 mL/hr over 60 Minutes Intravenous Every 48 hours 03/11/24 2155 03/12/24 1405   03/12/24 2200  ampicillin  (OMNIPEN) 2 g in sodium chloride  0.9 % 100 mL IVPB  Status:  Discontinued        2 g 300 mL/hr over 20 Minutes Intravenous Every 8 hours 03/12/24 1405 03/16/24 0915   03/12/24 0400  piperacillin -tazobactam (ZOSYN ) IVPB 3.375 g  Status:  Discontinued        3.375 g 12.5 mL/hr over 240 Minutes Intravenous Every 8 hours 03/11/24 2155  03/12/24 1405   03/11/24 1900  vancomycin  (VANCOCIN ) IVPB 1000 mg/200 mL premix        1,000 mg 200 mL/hr over 60 Minutes Intravenous  Once 03/11/24 1845 03/11/24 2135   03/11/24 1830  ampicillin  (OMNIPEN) 1 g in sodium chloride  0.9 % 100 mL IVPB  Status:  Discontinued        1 g 300 mL/hr over 20 Minutes Intravenous  Once 03/11/24 1815 03/11/24 1821   03/11/24 1830  piperacillin -tazobactam (ZOSYN ) IVPB 3.375 g        3.375 g 100 mL/hr over 30 Minutes Intravenous  Once 03/11/24 1821 03/11/24 2020       Subjective: Patient was seen and examined at bedside.  Overnight events noted. Patient denies any other concerns.  Blood cultures are positive,  Denies any fever, reports feeling improved  Objective: Vitals:   03/16/24 0000 03/16/24 0400 03/16/24 0800 03/16/24 0936  BP: (!) 116/51 (!) 128/58 (!) 117/50   Pulse: 84 76 67   Resp:  17 18   Temp:  99.7 F (37.6 C)  (!) 97.5 F (36.4 C)  TempSrc:  Oral    SpO2: 92% 93% 91%   Weight:      Height:        Intake/Output Summary (Last 24 hours) at 03/16/2024 1154 Last data filed at 03/15/2024 2201 Gross per 24 hour  Intake 120 ml  Output --  Net 120 ml   Filed Weights   03/11/24 1726 03/11/24 2159 03/12/24 0500  Weight: 42.6 kg 45.9 kg 45.6 kg    Examination:  General exam: Appears calm and comfortable, severely deconditioned, not in any acute distress. Respiratory system: CTA Bilaterally. Respiratory effort normal. RR 14 Cardiovascular system: S1 & S2 heard, RRR. No JVD, murmurs, rubs, gallops or clicks.  Gastrointestinal system: Abdomen is non distended, soft and non tender. Normal bowel sounds heard. Central nervous system: Alert and oriented x 3. No focal neurological deficits. Extremities: No edema, no cyanosis, no clubbing. Skin: No rashes, lesions or ulcers Psychiatry: Judgement and insight appear normal. Mood & affect appropriate.     Data Reviewed: I have personally reviewed following labs and imaging  studies  CBC: Recent Labs  Lab 03/11/24 1748 03/11/24 1753 03/11/24 2231 03/12/24 0525 03/13/24 0507 03/15/24 0533  WBC 16.1*  --  24.9* 24.1* 11.1* 10.3  NEUTROABS 15.1*  --   --   --   --   --   HGB 10.0* 10.9* 9.7* 9.4* 8.3* 7.9*  HCT 30.9* 32.0* 29.3* 27.4* 25.4* 23.6*  MCV 94.5  --  92.4 90.7 92.4 91.8  PLT 296  --  266 267 239 234   Basic Metabolic Panel: Recent Labs  Lab 03/09/24 1350 03/11/24 1748 03/11/24 1753 03/11/24 2231 03/12/24 0525 03/13/24 0507 03/15/24 0533 03/16/24 0602  NA 137 139 139  --  137 138 138  --   K 3.9 3.7 4.0  --  3.4* 3.4* 3.2*  --   CL 102 108  --   --  106 110 108  --   CO2 26 23  --   --  24 25 25   --  GLUCOSE 106* 94  --   --  131* 114* 113*  --   BUN 26* 21  --   --  20 18 18   --   CREATININE 0.94 1.02*  --  1.25* 1.20* 1.09* 0.86  --   CALCIUM 9.3 7.3*  --   --  8.1* 7.9* 8.3*  --   MG  --   --   --   --   --   --   --  1.8  PHOS  --   --   --   --   --   --   --  3.0   GFR: Estimated Creatinine Clearance: 31.4 mL/min (by C-G formula based on SCr of 0.86 mg/dL). Liver Function Tests: Recent Labs  Lab 03/11/24 1748 03/12/24 0525  AST 22 25  ALT 28 13  ALKPHOS 49 42  BILITOT 0.8 0.4  PROT 5.4* 5.6*  ALBUMIN 2.4* 2.4*   No results for input(s): LIPASE, AMYLASE in the last 168 hours. No results for input(s): AMMONIA in the last 168 hours. Coagulation Profile: No results for input(s): INR, PROTIME in the last 168 hours. Cardiac Enzymes: Recent Labs  Lab 03/11/24 1748  CKTOTAL 86   BNP (last 3 results) No results for input(s): PROBNP in the last 8760 hours. HbA1C: No results for input(s): HGBA1C in the last 72 hours. CBG: Recent Labs  Lab 03/13/24 1557 03/13/24 2354 03/14/24 0822 03/14/24 1626 03/16/24 0348  GLUCAP 170* 140* 125* 112* 118*   Lipid Profile: No results for input(s): CHOL, HDL, LDLCALC, TRIG, CHOLHDL, LDLDIRECT in the last 72 hours. Thyroid  Function Tests: No  results for input(s): TSH, T4TOTAL, FREET4, T3FREE, THYROIDAB in the last 72 hours. Anemia Panel: No results for input(s): VITAMINB12, FOLATE, FERRITIN, TIBC, IRON, RETICCTPCT in the last 72 hours.  Sepsis Labs: Recent Labs  Lab 03/11/24 1754  LATICACIDVEN 1.4    Recent Results (from the past 240 hours)  Blood culture (routine x 2)     Status: Abnormal   Collection Time: 03/11/24  7:05 PM   Specimen: BLOOD  Result Value Ref Range Status   Specimen Description BLOOD LEFT ANTECUBITAL  Final   Special Requests   Final    BOTTLES DRAWN AEROBIC AND ANAEROBIC Blood Culture adequate volume   Culture  Setup Time   Final    GRAM POSITIVE COCCI IN CHAINS ANAEROBIC BOTTLE ONLY CRITICAL RESULT CALLED TO, READ BACK BY AND VERIFIED WITH: PHARMD KARLEN BROVEY ON 03/12/24 @ 1351 BY DRT Performed at [facility], [address], [place], [place] [address]    Culture ENTEROCOCCUS FAECALIS (A)  Final   Report Status 03/14/2024 FINAL  Final   Organism ID, Bacteria ENTEROCOCCUS FAECALIS  Final      Susceptibility   Enterococcus faecalis - MIC*    AMPICILLIN  <=2 SENSITIVE Sensitive     VANCOMYCIN  1 SENSITIVE Sensitive     GENTAMICIN SYNERGY SENSITIVE Sensitive     * ENTEROCOCCUS FAECALIS  Blood Culture ID Panel (Reflexed)     Status: Abnormal   Collection Time: 03/11/24  7:05 PM  Result Value Ref Range Status   Enterococcus faecalis DETECTED (A) NOT DETECTED Final    Comment: CRITICAL RESULT CALLED TO, READ BACK BY AND VERIFIED WITH: PHARMD KARLEN BROVEY ON 03/12/24 @ 1351 BY DRT    Enterococcus Faecium NOT DETECTED NOT DETECTED Final   Listeria monocytogenes NOT DETECTED NOT DETECTED Final   Staphylococcus species NOT DETECTED NOT DETECTED Final   Staphylococcus  aureus (BCID) NOT DETECTED NOT DETECTED Final   Staphylococcus epidermidis NOT DETECTED NOT DETECTED Final   Staphylococcus lugdunensis NOT DETECTED NOT DETECTED Final   Streptococcus species NOT DETECTED  NOT DETECTED Final   Streptococcus agalactiae NOT DETECTED NOT DETECTED Final   Streptococcus pneumoniae NOT DETECTED NOT DETECTED Final   Streptococcus pyogenes NOT DETECTED NOT DETECTED Final   A.calcoaceticus-baumannii NOT DETECTED NOT DETECTED Final   Bacteroides fragilis NOT DETECTED NOT DETECTED Final   Enterobacterales NOT DETECTED NOT DETECTED Final   Enterobacter cloacae complex NOT DETECTED NOT DETECTED Final   Escherichia coli NOT DETECTED NOT DETECTED Final   Klebsiella aerogenes NOT DETECTED NOT DETECTED Final   Klebsiella oxytoca NOT DETECTED NOT DETECTED Final   Klebsiella pneumoniae NOT DETECTED NOT DETECTED Final   Proteus species NOT DETECTED NOT DETECTED Final   Salmonella species NOT DETECTED NOT DETECTED Final   Serratia marcescens NOT DETECTED NOT DETECTED Final   Haemophilus influenzae NOT DETECTED NOT DETECTED Final   Neisseria meningitidis NOT DETECTED NOT DETECTED Final   Pseudomonas aeruginosa NOT DETECTED NOT DETECTED Final   Stenotrophomonas maltophilia NOT DETECTED NOT DETECTED Final   Candida albicans NOT DETECTED NOT DETECTED Final   Candida auris NOT DETECTED NOT DETECTED Final   Candida glabrata NOT DETECTED NOT DETECTED Final   Candida krusei NOT DETECTED NOT DETECTED Final   Candida parapsilosis NOT DETECTED NOT DETECTED Final   Candida tropicalis NOT DETECTED NOT DETECTED Final   Cryptococcus neoformans/gattii NOT DETECTED NOT DETECTED Final   Vancomycin  resistance NOT DETECTED NOT DETECTED Final    Comment: Performed at [facility], [address], [place], [place] [address]  Blood culture (routine x 2)     Status: Abnormal   Collection Time: 03/11/24  7:09 PM   Specimen: BLOOD  Result Value Ref Range Status   Specimen Description BLOOD RIGHT ANTECUBITAL  Final   Special Requests   Final    BOTTLES DRAWN AEROBIC AND ANAEROBIC Blood Culture results may not be optimal due to an inadequate volume of blood received in culture bottles    Culture  Setup Time   Final    GRAM POSITIVE COCCI IN CHAINS IN PAIRS AEROBIC BOTTLE ONLY CRITICAL VALUE NOTED.  VALUE IS CONSISTENT WITH PREVIOUSLY REPORTED AND CALLED VALUE.    Culture (A)  Final    ENTEROCOCCUS FAECALIS SUSCEPTIBILITIES PERFORMED ON PREVIOUS CULTURE WITHIN THE LAST 5 DAYS. Performed at [facility], [address] [place], [place] [address]    Report Status 03/15/2024 FINAL  Final  Urine Culture     Status: Abnormal   Collection Time: 03/12/24  4:45 AM   Specimen: Urine, Clean Catch  Result Value Ref Range Status   Specimen Description URINE, CLEAN CATCH  Final   Special Requests NONE  Final   Culture (A)  Final    <10,000 COLONIES/mL INSIGNIFICANT GROWTH Performed at [facility], [address] [place], [place] [address]    Report Status 03/13/2024 FINAL  Final  Culture, blood (Routine X 2) w Reflex to ID Panel     Status: None (Preliminary result)   Collection Time: 03/13/24  5:07 AM   Specimen: BLOOD  Result Value Ref Range Status   Specimen Description BLOOD BLOOD LEFT ARM  Final   Special Requests   Final    BOTTLES DRAWN AEROBIC AND ANAEROBIC Blood Culture adequate volume   Culture   Final    NO GROWTH 3 DAYS Performed at [facility], [address], [place], [place] [address]    Report Status PENDING  Incomplete  C Difficile Quick Screen w PCR reflex     Status: None   Collection Time: 03/13/24 10:48 AM   Specimen: Stool  Result Value Ref Range Status   C Diff antigen NEGATIVE NEGATIVE Final   C Diff toxin NEGATIVE NEGATIVE Final   C Diff interpretation No C. difficile detected.  Final    Comment: Performed at [facility], [address], [place], [place] [address]  Gastrointestinal Panel by PCR , Stool     Status: None   Collection Time: 03/13/24  2:30 PM   Specimen: Stool  Result Value Ref Range Status   Campylobacter species NOT DETECTED NOT DETECTED Final   Plesimonas shigelloides NOT DETECTED NOT DETECTED  Final   Salmonella species NOT DETECTED NOT DETECTED Final   Yersinia enterocolitica NOT DETECTED NOT DETECTED Final   Vibrio species NOT DETECTED NOT DETECTED Final   Vibrio cholerae NOT DETECTED NOT DETECTED Final   Enteroaggregative E coli (EAEC) NOT DETECTED NOT DETECTED Final   Enteropathogenic E coli (EPEC) NOT DETECTED NOT DETECTED Final   Enterotoxigenic E coli (ETEC) NOT DETECTED NOT DETECTED Final   Shiga like toxin producing E coli (STEC) NOT DETECTED NOT DETECTED Final   Shigella/Enteroinvasive E coli (EIEC) NOT DETECTED NOT DETECTED Final   Cryptosporidium NOT DETECTED NOT DETECTED Final   Cyclospora cayetanensis NOT DETECTED NOT DETECTED Final   Entamoeba histolytica NOT DETECTED NOT DETECTED Final   Giardia lamblia NOT DETECTED NOT DETECTED Final   Adenovirus F40/41 NOT DETECTED NOT DETECTED Final   Astrovirus NOT DETECTED NOT DETECTED Final   Norovirus GI/GII NOT DETECTED NOT DETECTED Final   Rotavirus A NOT DETECTED NOT DETECTED Final   Sapovirus (I, II, IV, and V) NOT DETECTED NOT DETECTED Final    Comment: Performed at [facility], [address], [place], [place] [address]    Radiology Studies: No results found.  Scheduled Meds:  artificial tears  1 drop Both Eyes q morning   Chlorhexidine  Gluconate Cloth  6 each Topical Daily   donepezil   10 mg Oral q morning   escitalopram   20 mg Oral q morning   feeding supplement  237 mL Oral BID BM   Gerhardt's butt cream   Topical BID   heparin  injection (subcutaneous)  5,000 Units Subcutaneous Q8H   memantine   5 mg Oral BID   mirabegron  ER  50 mg Oral q morning   nicotine   21 mg Transdermal Daily   Continuous Infusions:  ampicillin  (OMNIPEN) IV       LOS: 5 days    Time spent: 35 mins    Darcel Dawley, MD [facility]   If 7PM-7AM, please contact night-coverage

## 2024-03-17 DIAGNOSIS — R7881 Bacteremia: Secondary | ICD-10-CM | POA: Diagnosis not present

## 2024-03-17 DIAGNOSIS — B952 Enterococcus as the cause of diseases classified elsewhere: Secondary | ICD-10-CM | POA: Diagnosis not present

## 2024-03-17 LAB — CBC
HCT: 24.4 % — ABNORMAL LOW (ref 36.0–46.0)
Hemoglobin: 8.1 g/dL — ABNORMAL LOW (ref 12.0–15.0)
MCH: 30.1 pg (ref 26.0–34.0)
MCHC: 33.2 g/dL (ref 30.0–36.0)
MCV: 90.7 fL (ref 80.0–100.0)
Platelets: 289 K/uL (ref 150–400)
RBC: 2.69 MIL/uL — ABNORMAL LOW (ref 3.87–5.11)
RDW: 14.5 % (ref 11.5–15.5)
WBC: 10.7 K/uL — ABNORMAL HIGH (ref 4.0–10.5)
nRBC: 0 % (ref 0.0–0.2)

## 2024-03-17 LAB — BASIC METABOLIC PANEL WITH GFR
Anion gap: 6 (ref 5–15)
BUN: 21 mg/dL (ref 8–23)
CO2: 23 mmol/L (ref 22–32)
Calcium: 8.5 mg/dL — ABNORMAL LOW (ref 8.9–10.3)
Chloride: 109 mmol/L (ref 98–111)
Creatinine, Ser: 0.81 mg/dL (ref 0.44–1.00)
GFR, Estimated: >60 mL/min (ref >60)
Glucose, Bld: 107 mg/dL — ABNORMAL HIGH (ref 70–99)
Potassium: 3.2 mmol/L — ABNORMAL LOW (ref 3.5–5.1)
Sodium: 138 mmol/L (ref 135–145)

## 2024-03-17 MED ORDER — POTASSIUM CHLORIDE 20 MEQ PO PACK
40.0000 meq | PACK | Freq: Once | ORAL | Status: AC
  Administered 2024-03-17: 40 meq via ORAL
  Filled 2024-03-17: qty 2

## 2024-03-17 NOTE — Progress Notes (Signed)
 PROGRESS NOTE    CANDELA [name]  FMW:[id number] DOB: [birth year] DOA: 03/11/2024 PCP: Patti Mliss LABOR, FNP   Brief Narrative:  This 83 y.o. female who presents from home with worsening mental status after recent bilateral ureteroscopy with laser lithotripsy,  stone extraction with left ureteral stent exchange and right ureteral stent placement.  Patient has known comorbid conditions including vascular dementia, prior TIA, chronic anemia of chronic disease, COPD without oxygen. In the ED patient noted to be febrile,  tachycardic hypotensive , meeting sepsis criteria given presumed infection of UTI with recent instrumentation.  Foley placed at intake per urology request, antibiotics initiated with broad-spectrum coverage.   Assessment & Plan:   Principal Problem:   Bacteremia due to Enterococcus Active Problems:   COPD (chronic obstructive pulmonary disease) (HCC)   Memory loss   UTI (urinary tract infection)   Sepsis (HCC)   Acute encephalopathy   ARF (acute renal failure) (HCC)   Anemia  Sepsis secondary to UTI, POA: Enterococcus bacteremia: - Recent bilateral ureteral stent placements on 8/20  with laser lithotripsy - Disseminated infection likely present prior to procedure secondary to infected nephrolithiasis. - Narrowed down antibiotics to ampicillin  per infectious disease - Blood culture positive for Enterococcus. - TTE - 60/65% without notable vegetation - will likely need TEE in the next few days to evaluate for valvular lesions. - ID recommended no plans for TEE at this time unless repeat blood cultures positive.  If she has fever in next 24 to 48 hours may need CT abdomen pelvis or TEE.   Acute metabolic encephalopathy secondary to sepsis> resolved. Appears to be improving slowly, follow with family given history of vascular dementia,  unclear baseline at this time.   She appears back to her baseline mental status.   Acute kidney injury: > Resolved. - Complicated by  recent hydronephrosis /obstructing nephrolithiasis, recent stent placements and urological procedure.  - Foley removed, stents will remain in place for now - Creatinine continues to downtrend appropriately - Continue IV fluids, advance diet as tolerated, follow repeat labs.   Hypocalcemia: - Acute on chronic, likely at least partially hemodilutional  -Continue to follow, no signs or symptoms of clinical hypocalcemia.   History of dementia: Continue donepezil , memantine .   Chronic normocytic anemia, likely anemia chronic disease given above  - No acute signs or symptoms of bleeding, follow repeat labs - Iron panel consistent with mixed chronic disease anemia as well as for iron deficiency given normal ferritin.   Prior history of TIA : Continue aspirin .  COPD noted in the chart presently not wheezing.  Hypokalemia: Replaced.  Continue to monitor   DVT prophylaxis: Heparin  sq Code Status:Full code Family Communication: No family at bed side. Disposition Plan:  Status is: Inpatient Remains inpatient appropriate because:   Severity of illness   Consultants:  Urology Infectious Diseases.  Procedures:  Antimicrobials:  Anti-infectives (From admission, onward)    Start     Dose/Rate Route Frequency Ordered Stop   03/16/24 1200  ampicillin  (OMNIPEN) 2 g in sodium chloride  0.9 % 100 mL IVPB        2 g 300 mL/hr over 20 Minutes Intravenous Every 6 hours 03/16/24 0915     03/13/24 2000  vancomycin  (VANCOREADY) IVPB 750 mg/150 mL  Status:  Discontinued        750 mg 150 mL/hr over 60 Minutes Intravenous Every 48 hours 03/11/24 2155 03/12/24 1405   03/12/24 2200  ampicillin  (OMNIPEN) 2 g in sodium chloride  0.9 %  100 mL IVPB  Status:  Discontinued        2 g 300 mL/hr over 20 Minutes Intravenous Every 8 hours 03/12/24 1405 03/16/24 0915   03/12/24 0400  piperacillin -tazobactam (ZOSYN ) IVPB 3.375 g  Status:  Discontinued        3.375 g 12.5 mL/hr over 240 Minutes Intravenous  Every 8 hours 03/11/24 2155 03/12/24 1405   03/11/24 1900  vancomycin  (VANCOCIN ) IVPB 1000 mg/200 mL premix        1,000 mg 200 mL/hr over 60 Minutes Intravenous  Once 03/11/24 1845 03/11/24 2135   03/11/24 1830  ampicillin  (OMNIPEN) 1 g in sodium chloride  0.9 % 100 mL IVPB  Status:  Discontinued        1 g 300 mL/hr over 20 Minutes Intravenous  Once 03/11/24 1815 03/11/24 1821   03/11/24 1830  piperacillin -tazobactam (ZOSYN ) IVPB 3.375 g        3.375 g 100 mL/hr over 30 Minutes Intravenous  Once 03/11/24 1821 03/11/24 2020       Subjective: Patient was seen and examined at bedside.Overnight events noted. Patient denies any other concerns.  Blood cultures are positive,   She reports feeling much improved, Her ureteral stent displaced and removed.  Objective: Vitals:   03/16/24 2004 03/17/24 0006 03/17/24 0404 03/17/24 0905  BP: (!) 129/53 (!) 111/40 (!) 118/52 (!) 116/47  Pulse: 81 83 75 73  Resp: 14 16 16 17   Temp: 99.1 F (37.3 C) 99 F (37.2 C) 99.1 F (37.3 C) 98 F (36.7 C)  TempSrc: Oral Oral Oral Oral  SpO2: 96% 92% 94% 96%  Weight:      Height:        Intake/Output Summary (Last 24 hours) at 03/17/2024 1328 Last data filed at 03/17/2024 0644 Gross per 24 hour  Intake 352.31 ml  Output --  Net 352.31 ml   Filed Weights   03/11/24 1726 03/11/24 2159 03/12/24 0500  Weight: 42.6 kg 45.9 kg 45.6 kg    Examination:  General exam: Appears calm and comfortable, severely deconditioned, not in any acute distress. Respiratory system: CTA Bilaterally. Respiratory effort normal. RR 14 Cardiovascular system: S1 & S2 heard, RRR. No JVD, murmurs, rubs, gallops or clicks.  Gastrointestinal system: Abdomen is non distended, soft and non tender. Normal bowel sounds heard. Central nervous system: Alert and oriented x 3. No focal neurological deficits. Extremities: No edema, no cyanosis, no clubbing. Skin: No rashes, lesions or ulcers Psychiatry: Judgement and insight appear  normal. Mood & affect appropriate.     Data Reviewed: I have personally reviewed following labs and imaging studies  CBC: Recent Labs  Lab 03/11/24 1748 03/11/24 1753 03/11/24 2231 03/12/24 0525 03/13/24 0507 03/15/24 0533 03/17/24 0551  WBC 16.1*  --  24.9* 24.1* 11.1* 10.3 10.7*  NEUTROABS 15.1*  --   --   --   --   --   --   HGB 10.0*   < > 9.7* 9.4* 8.3* 7.9* 8.1*  HCT 30.9*   < > 29.3* 27.4* 25.4* 23.6* 24.4*  MCV 94.5  --  92.4 90.7 92.4 91.8 90.7  PLT 296  --  266 267 239 234 289   < > = values in this interval not displayed.   Basic Metabolic Panel: Recent Labs  Lab 03/11/24 1748 03/11/24 1753 03/11/24 2231 03/12/24 0525 03/13/24 0507 03/15/24 0533 03/16/24 0602 03/17/24 0551  NA 139 139  --  137 138 138  --  138  K 3.7 4.0  --  3.4* 3.4* 3.2*  --  3.2*  CL 108  --   --  106 110 108  --  109  CO2 23  --   --  24 25 25   --  23  GLUCOSE 94  --   --  131* 114* 113*  --  107*  BUN 21  --   --  20 18 18   --  21  CREATININE 1.02*  --  1.25* 1.20* 1.09* 0.86  --  0.81  CALCIUM 7.3*  --   --  8.1* 7.9* 8.3*  --  8.5*  MG  --   --   --   --   --   --  1.8  --   PHOS  --   --   --   --   --   --  3.0  --    GFR: Estimated Creatinine Clearance: 33.4 mL/min (by C-G formula based on SCr of 0.81 mg/dL). Liver Function Tests: Recent Labs  Lab 03/11/24 1748 03/12/24 0525  AST 22 25  ALT 28 13  ALKPHOS 49 42  BILITOT 0.8 0.4  PROT 5.4* 5.6*  ALBUMIN 2.4* 2.4*   No results for input(s): LIPASE, AMYLASE in the last 168 hours. No results for input(s): AMMONIA in the last 168 hours. Coagulation Profile: No results for input(s): INR, PROTIME in the last 168 hours. Cardiac Enzymes: Recent Labs  Lab 03/11/24 1748  CKTOTAL 86   BNP (last 3 results) No results for input(s): PROBNP in the last 8760 hours. HbA1C: No results for input(s): HGBA1C in the last 72 hours. CBG: Recent Labs  Lab 03/13/24 2354 03/14/24 0822 03/14/24 1626 03/16/24 0348  03/16/24 1342  GLUCAP 140* 125* 112* 118* 146*   Lipid Profile: No results for input(s): CHOL, HDL, LDLCALC, TRIG, CHOLHDL, LDLDIRECT in the last 72 hours. Thyroid  Function Tests: No results for input(s): TSH, T4TOTAL, FREET4, T3FREE, THYROIDAB in the last 72 hours. Anemia Panel: No results for input(s): VITAMINB12, FOLATE, FERRITIN, TIBC, IRON, RETICCTPCT in the last 72 hours.  Sepsis Labs: Recent Labs  Lab 03/11/24 1754  LATICACIDVEN 1.4    Recent Results (from the past 240 hours)  Blood culture (routine x 2)     Status: Abnormal   Collection Time: 03/11/24  7:05 PM   Specimen: BLOOD  Result Value Ref Range Status   Specimen Description BLOOD LEFT ANTECUBITAL  Final   Special Requests   Final    BOTTLES DRAWN AEROBIC AND ANAEROBIC Blood Culture adequate volume   Culture  Setup Time   Final    GRAM POSITIVE COCCI IN CHAINS ANAEROBIC BOTTLE ONLY CRITICAL RESULT CALLED TO, READ BACK BY AND VERIFIED WITH: PHARMD KARLEN BROVEY ON 03/12/24 @ 1351 BY DRT Performed at [facility], [address], [place], [place] [address]    Culture ENTEROCOCCUS FAECALIS (A)  Final   Report Status 03/14/2024 FINAL  Final   Organism ID, Bacteria ENTEROCOCCUS FAECALIS  Final      Susceptibility   Enterococcus faecalis - MIC*    AMPICILLIN  <=2 SENSITIVE Sensitive     VANCOMYCIN  1 SENSITIVE Sensitive     GENTAMICIN SYNERGY SENSITIVE Sensitive     * ENTEROCOCCUS FAECALIS  Blood Culture ID Panel (Reflexed)     Status: Abnormal   Collection Time: 03/11/24  7:05 PM  Result Value Ref Range Status   Enterococcus faecalis DETECTED (A) NOT DETECTED Final    Comment: CRITICAL RESULT CALLED TO, READ BACK BY AND VERIFIED WITH:  PHARMD KARLEN BROVEY ON 03/12/24 @ 1351 BY DRT    Enterococcus Faecium NOT DETECTED NOT DETECTED Final   Listeria monocytogenes NOT DETECTED NOT DETECTED Final   Staphylococcus species NOT DETECTED NOT DETECTED Final   Staphylococcus aureus  (BCID) NOT DETECTED NOT DETECTED Final   Staphylococcus epidermidis NOT DETECTED NOT DETECTED Final   Staphylococcus lugdunensis NOT DETECTED NOT DETECTED Final   Streptococcus species NOT DETECTED NOT DETECTED Final   Streptococcus agalactiae NOT DETECTED NOT DETECTED Final   Streptococcus pneumoniae NOT DETECTED NOT DETECTED Final   Streptococcus pyogenes NOT DETECTED NOT DETECTED Final   A.calcoaceticus-baumannii NOT DETECTED NOT DETECTED Final   Bacteroides fragilis NOT DETECTED NOT DETECTED Final   Enterobacterales NOT DETECTED NOT DETECTED Final   Enterobacter cloacae complex NOT DETECTED NOT DETECTED Final   Escherichia coli NOT DETECTED NOT DETECTED Final   Klebsiella aerogenes NOT DETECTED NOT DETECTED Final   Klebsiella oxytoca NOT DETECTED NOT DETECTED Final   Klebsiella pneumoniae NOT DETECTED NOT DETECTED Final   Proteus species NOT DETECTED NOT DETECTED Final   Salmonella species NOT DETECTED NOT DETECTED Final   Serratia marcescens NOT DETECTED NOT DETECTED Final   Haemophilus influenzae NOT DETECTED NOT DETECTED Final   Neisseria meningitidis NOT DETECTED NOT DETECTED Final   Pseudomonas aeruginosa NOT DETECTED NOT DETECTED Final   Stenotrophomonas maltophilia NOT DETECTED NOT DETECTED Final   Candida albicans NOT DETECTED NOT DETECTED Final   Candida auris NOT DETECTED NOT DETECTED Final   Candida glabrata NOT DETECTED NOT DETECTED Final   Candida krusei NOT DETECTED NOT DETECTED Final   Candida parapsilosis NOT DETECTED NOT DETECTED Final   Candida tropicalis NOT DETECTED NOT DETECTED Final   Cryptococcus neoformans/gattii NOT DETECTED NOT DETECTED Final   Vancomycin  resistance NOT DETECTED NOT DETECTED Final    Comment: Performed at [facility], [address] [place], [place] [address]  Blood culture (routine x 2)     Status: Abnormal   Collection Time: 03/11/24  7:09 PM   Specimen: BLOOD  Result Value Ref Range Status   Specimen Description BLOOD RIGHT  ANTECUBITAL  Final   Special Requests   Final    BOTTLES DRAWN AEROBIC AND ANAEROBIC Blood Culture results may not be optimal due to an inadequate volume of blood received in culture bottles   Culture  Setup Time   Final    GRAM POSITIVE COCCI IN CHAINS IN PAIRS AEROBIC BOTTLE ONLY CRITICAL VALUE NOTED.  VALUE IS CONSISTENT WITH PREVIOUSLY REPORTED AND CALLED VALUE.    Culture (A)  Final    ENTEROCOCCUS FAECALIS SUSCEPTIBILITIES PERFORMED ON PREVIOUS CULTURE WITHIN THE LAST 5 DAYS. Performed at [facility], [address], [place], [place] [address]    Report Status 03/15/2024 FINAL  Final  Urine Culture     Status: Abnormal   Collection Time: 03/12/24  4:45 AM   Specimen: Urine, Clean Catch  Result Value Ref Range Status   Specimen Description URINE, CLEAN CATCH  Final   Special Requests NONE  Final   Culture (A)  Final    <10,000 COLONIES/mL INSIGNIFICANT GROWTH Performed at [facility], [address], [place], [place] [address]    Report Status 03/13/2024 FINAL  Final  Culture, blood (Routine X 2) w Reflex to ID Panel     Status: None (Preliminary result)   Collection Time: 03/13/24  5:07 AM   Specimen: BLOOD  Result Value Ref Range Status   Specimen Description BLOOD BLOOD  LEFT ARM  Final   Special Requests   Final    BOTTLES DRAWN AEROBIC AND ANAEROBIC Blood Culture adequate volume   Culture   Final    NO GROWTH 4 DAYS Performed at [facility], [address] [place], [place] [address]    Report Status PENDING  Incomplete  C Difficile Quick Screen w PCR reflex     Status: None   Collection Time: 03/13/24 10:48 AM   Specimen: Stool  Result Value Ref Range Status   C Diff antigen NEGATIVE NEGATIVE Final   C Diff toxin NEGATIVE NEGATIVE Final   C Diff interpretation No C. difficile detected.  Final    Comment: Performed at [facility], [address] [place], [place] [address]  Gastrointestinal Panel by PCR , Stool     Status: None    Collection Time: 03/13/24  2:30 PM   Specimen: Stool  Result Value Ref Range Status   Campylobacter species NOT DETECTED NOT DETECTED Final   Plesimonas shigelloides NOT DETECTED NOT DETECTED Final   Salmonella species NOT DETECTED NOT DETECTED Final   Yersinia enterocolitica NOT DETECTED NOT DETECTED Final   Vibrio species NOT DETECTED NOT DETECTED Final   Vibrio cholerae NOT DETECTED NOT DETECTED Final   Enteroaggregative E coli (EAEC) NOT DETECTED NOT DETECTED Final   Enteropathogenic E coli (EPEC) NOT DETECTED NOT DETECTED Final   Enterotoxigenic E coli (ETEC) NOT DETECTED NOT DETECTED Final   Shiga like toxin producing E coli (STEC) NOT DETECTED NOT DETECTED Final   Shigella/Enteroinvasive E coli (EIEC) NOT DETECTED NOT DETECTED Final   Cryptosporidium NOT DETECTED NOT DETECTED Final   Cyclospora cayetanensis NOT DETECTED NOT DETECTED Final   Entamoeba histolytica NOT DETECTED NOT DETECTED Final   Giardia lamblia NOT DETECTED NOT DETECTED Final   Adenovirus F40/41 NOT DETECTED NOT DETECTED Final   Astrovirus NOT DETECTED NOT DETECTED Final   Norovirus GI/GII NOT DETECTED NOT DETECTED Final   Rotavirus A NOT DETECTED NOT DETECTED Final   Sapovirus (I, II, IV, and V) NOT DETECTED NOT DETECTED Final    Comment: Performed at [facility], [address], [place], [place] [address]    Radiology Studies: No results found.  Scheduled Meds:  artificial tears  1 drop Both Eyes q morning   Chlorhexidine  Gluconate Cloth  6 each Topical Daily   donepezil   10 mg Oral q morning   escitalopram   20 mg Oral q morning   feeding supplement  237 mL Oral BID BM   Gerhardt's butt cream   Topical BID   heparin  injection (subcutaneous)  5,000 Units Subcutaneous Q8H   memantine   5 mg Oral BID   mirabegron  ER  50 mg Oral q morning   nicotine   21 mg Transdermal Daily   Continuous Infusions:  ampicillin  (OMNIPEN) IV 2 g (03/17/24 1259)     LOS: 6 days    Time spent: 35  mins    Darcel Dawley, MD [facility]   If 7PM-7AM, please contact night-coverage

## 2024-03-17 NOTE — Progress Notes (Signed)
 I call the Urologist to inform him that Pt ureteral stent has been displaced and was visible. He verbally ordered that I finish removing stent. One stent was removed and peri care done. Pt was alert and and orientated X4, denies any pains or discomfort during and after the displaced stent removal.

## 2024-03-17 NOTE — Progress Notes (Signed)
 LILLETTE Lye, Nurse Manager, spoke with Stephania Balloon RN who cared for this patient on 8/26 1900-0700 to verify if the stent came out overnight. She stated that when she was cleaning the patient prior to her shift ending it pulled out in its entirety. Ole Bourdon NP made aware. Lye Louder BSN, Charity fundraiser, Facilities manager

## 2024-03-17 NOTE — Progress Notes (Signed)
 [facility]  Date of Admission:  03/11/2024     Reason for Follow Up: Bacteremia due to Enterococcus  Total days of antibiotics 7         ASSESSMENT:  [name] is an 83 y/o caucasian female with vascular dementia and recent Enterococcal bacteremia with urinary source s/p treatment in March 2025 presenting with acute onset altered mental status and fever following a bilateral stent placement and lithotripsy for stone removal and found to have Enterococcus faecalis bacteremia.   [name] has remained afebrile and is tolerating antibiotics with no adverse side effects.  Urology following up on stent placement.  Continue current dose of ampicillin  with plan to transition to high-dose amoxicillin  at discharge using clearance of blood cultures as day 1 and a treatment duration of 2 weeks.  Continue to follow-up with urology.  Okay for discharge from ID standpoint.  Remaining medical and supportive care per internal medicine.    PLAN:  Continue current dose of ampicillin  while inpatient and transition to high-dose amoxicillin  at discharge for a total of 2 weeks from clearance of blood cultures. Ureteral stent management per urology. Okay for discharge from ID standpoint. Remaining medical and supportive care per internal medicine.  Principal Problem:   Bacteremia due to Enterococcus Active Problems:   COPD (chronic obstructive pulmonary disease) (HCC)   Memory loss   UTI (urinary tract infection)   Sepsis (HCC)   Acute encephalopathy   ARF (acute renal failure) (HCC)   Anemia    artificial tears  1 drop Both Eyes q morning   Chlorhexidine  Gluconate Cloth  6 each Topical Daily   donepezil   10 mg Oral q morning   escitalopram   20 mg Oral q morning   feeding supplement  237 mL Oral BID BM   Gerhardt's butt cream   Topical BID   heparin  injection (subcutaneous)  5,000 Units Subcutaneous Q8H   memantine   5 mg Oral BID   mirabegron  ER  50 mg Oral q  morning   nicotine   21 mg Transdermal Daily    SUBJECTIVE:  Afebrile overnight with no acute events.  No family at bedside.  Allergies  Allergen Reactions   Sulfa Antibiotics Itching and Rash     Review of Systems: Review of Systems  Constitutional:  Negative for chills, fever and weight loss.  Respiratory:  Negative for cough, shortness of breath and wheezing.   Cardiovascular:  Negative for chest pain and leg swelling.  Gastrointestinal:  Negative for abdominal pain, constipation, diarrhea, nausea and vomiting.  Skin:  Negative for rash.      OBJECTIVE: Vitals:   03/16/24 2004 03/17/24 0006 03/17/24 0404 03/17/24 0905  BP: (!) 129/53 (!) 111/40 (!) 118/52 (!) 116/47  Pulse: 81 83 75 73  Resp: 14 16 16 17   Temp: 99.1 F (37.3 C) 99 F (37.2 C) 99.1 F (37.3 C) 98 F (36.7 C)  TempSrc: Oral Oral Oral Oral  SpO2: 96% 92% 94% 96%  Weight:      Height:       Body mass index is 21.01 kg/m.  Physical Exam Constitutional:      General: She is not in acute distress.    Appearance: She is well-developed.  Cardiovascular:     Rate and Rhythm: Normal rate and regular rhythm.     Heart sounds: Normal heart sounds.  Pulmonary:     Effort: Pulmonary effort is normal.     Breath sounds: Normal breath sounds.  Skin:    General: Skin is warm and dry.  Neurological:     Mental Status: She is alert.  Psychiatric:        Mood and Affect: Mood normal.     Lab Results Lab Results  Component Value Date   WBC 10.7 (H) 03/17/2024   HGB 8.1 (L) 03/17/2024   HCT 24.4 (L) 03/17/2024   MCV 90.7 03/17/2024   PLT 289 03/17/2024    Lab Results  Component Value Date   CREATININE 0.81 03/17/2024   BUN 21 03/17/2024   NA 138 03/17/2024   K 3.2 (L) 03/17/2024   CL 109 03/17/2024   CO2 23 03/17/2024    Lab Results  Component Value Date   ALT 13 03/12/2024   AST 25 03/12/2024   ALKPHOS 42 03/12/2024   BILITOT 0.4 03/12/2024     Microbiology: Recent Results (from  the past 240 hours)  Blood culture (routine x 2)     Status: Abnormal   Collection Time: 03/11/24  7:05 PM   Specimen: BLOOD  Result Value Ref Range Status   Specimen Description BLOOD LEFT ANTECUBITAL  Final   Special Requests   Final    BOTTLES DRAWN AEROBIC AND ANAEROBIC Blood Culture adequate volume   Culture  Setup Time   Final    GRAM POSITIVE COCCI IN CHAINS ANAEROBIC BOTTLE ONLY CRITICAL RESULT CALLED TO, READ BACK BY AND VERIFIED WITH: PHARMD KARLEN BROVEY ON 03/12/24 @ 1351 BY DRT Performed at [facility], [address] [place], [place] [address]    Culture ENTEROCOCCUS FAECALIS (A)  Final   Report Status 03/14/2024 FINAL  Final   Organism ID, Bacteria ENTEROCOCCUS FAECALIS  Final      Susceptibility   Enterococcus faecalis - MIC*    AMPICILLIN  <=2 SENSITIVE Sensitive     VANCOMYCIN  1 SENSITIVE Sensitive     GENTAMICIN SYNERGY SENSITIVE Sensitive     * ENTEROCOCCUS FAECALIS  Blood Culture ID Panel (Reflexed)     Status: Abnormal   Collection Time: 03/11/24  7:05 PM  Result Value Ref Range Status   Enterococcus faecalis DETECTED (A) NOT DETECTED Final    Comment: CRITICAL RESULT CALLED TO, READ BACK BY AND VERIFIED WITH: PHARMD KARLEN BROVEY ON 03/12/24 @ 1351 BY DRT    Enterococcus Faecium NOT DETECTED NOT DETECTED Final   Listeria monocytogenes NOT DETECTED NOT DETECTED Final   Staphylococcus species NOT DETECTED NOT DETECTED Final   Staphylococcus aureus (BCID) NOT DETECTED NOT DETECTED Final   Staphylococcus epidermidis NOT DETECTED NOT DETECTED Final   Staphylococcus lugdunensis NOT DETECTED NOT DETECTED Final   Streptococcus species NOT DETECTED NOT DETECTED Final   Streptococcus agalactiae NOT DETECTED NOT DETECTED Final   Streptococcus pneumoniae NOT DETECTED NOT DETECTED Final   Streptococcus pyogenes NOT DETECTED NOT DETECTED Final   A.calcoaceticus-baumannii NOT DETECTED NOT DETECTED Final   Bacteroides fragilis NOT DETECTED NOT DETECTED Final    Enterobacterales NOT DETECTED NOT DETECTED Final   Enterobacter cloacae complex NOT DETECTED NOT DETECTED Final   Escherichia coli NOT DETECTED NOT DETECTED Final   Klebsiella aerogenes NOT DETECTED NOT DETECTED Final   Klebsiella oxytoca NOT DETECTED NOT DETECTED Final   Klebsiella pneumoniae NOT DETECTED NOT DETECTED Final   Proteus species NOT DETECTED NOT DETECTED Final   Salmonella species NOT DETECTED NOT DETECTED Final   Serratia marcescens NOT DETECTED NOT DETECTED Final   Haemophilus influenzae NOT DETECTED NOT DETECTED Final   Neisseria meningitidis NOT DETECTED NOT  DETECTED Final   Pseudomonas aeruginosa NOT DETECTED NOT DETECTED Final   Stenotrophomonas maltophilia NOT DETECTED NOT DETECTED Final   Candida albicans NOT DETECTED NOT DETECTED Final   Candida auris NOT DETECTED NOT DETECTED Final   Candida glabrata NOT DETECTED NOT DETECTED Final   Candida krusei NOT DETECTED NOT DETECTED Final   Candida parapsilosis NOT DETECTED NOT DETECTED Final   Candida tropicalis NOT DETECTED NOT DETECTED Final   Cryptococcus neoformans/gattii NOT DETECTED NOT DETECTED Final   Vancomycin  resistance NOT DETECTED NOT DETECTED Final    Comment: Performed at [facility], [address], [place], [place] [address]  Blood culture (routine x 2)     Status: Abnormal   Collection Time: 03/11/24  7:09 PM   Specimen: BLOOD  Result Value Ref Range Status   Specimen Description BLOOD RIGHT ANTECUBITAL  Final   Special Requests   Final    BOTTLES DRAWN AEROBIC AND ANAEROBIC Blood Culture results may not be optimal due to an inadequate volume of blood received in culture bottles   Culture  Setup Time   Final    GRAM POSITIVE COCCI IN CHAINS IN PAIRS AEROBIC BOTTLE ONLY CRITICAL VALUE NOTED.  VALUE IS CONSISTENT WITH PREVIOUSLY REPORTED AND CALLED VALUE.    Culture (A)  Final    ENTEROCOCCUS FAECALIS SUSCEPTIBILITIES PERFORMED ON PREVIOUS CULTURE WITHIN THE LAST 5 DAYS. Performed at [facility], [address], [place], [place] [address]    Report Status 03/15/2024 FINAL  Final  Urine Culture     Status: Abnormal   Collection Time: 03/12/24  4:45 AM   Specimen: Urine, Clean Catch  Result Value Ref Range Status   Specimen Description URINE, CLEAN CATCH  Final   Special Requests NONE  Final   Culture (A)  Final    <10,000 COLONIES/mL INSIGNIFICANT GROWTH Performed at [facility], [address] [place], [place] [address]    Report Status 03/13/2024 FINAL  Final  Culture, blood (Routine X 2) w Reflex to ID Panel     Status: None (Preliminary result)   Collection Time: 03/13/24  5:07 AM   Specimen: BLOOD  Result Value Ref Range Status   Specimen Description BLOOD BLOOD LEFT ARM  Final   Special Requests   Final    BOTTLES DRAWN AEROBIC AND ANAEROBIC Blood Culture adequate volume   Culture   Final    NO GROWTH 4 DAYS Performed at [facility], [address] [place], [place] [address]    Report Status PENDING  Incomplete  C Difficile Quick Screen w PCR reflex     Status: None   Collection Time: 03/13/24 10:48 AM   Specimen: Stool  Result Value Ref Range Status   C Diff antigen NEGATIVE NEGATIVE Final   C Diff toxin NEGATIVE NEGATIVE Final   C Diff interpretation No C. difficile detected.  Final    Comment: Performed at [facility], [address], [place], [place] [address]  Gastrointestinal Panel by PCR , Stool     Status: None   Collection Time: 03/13/24  2:30 PM   Specimen: Stool  Result Value Ref Range Status   Campylobacter species NOT DETECTED NOT DETECTED Final   Plesimonas shigelloides NOT DETECTED NOT DETECTED Final   Salmonella species NOT DETECTED NOT DETECTED Final   Yersinia enterocolitica NOT DETECTED NOT DETECTED Final   Vibrio species NOT DETECTED NOT DETECTED Final   Vibrio cholerae NOT DETECTED NOT DETECTED  Final   Enteroaggregative E coli (EAEC) NOT DETECTED NOT DETECTED Final   Enteropathogenic E coli (EPEC) NOT  DETECTED NOT DETECTED Final   Enterotoxigenic E coli (ETEC) NOT DETECTED NOT DETECTED Final   Shiga like toxin producing E coli (STEC) NOT DETECTED NOT DETECTED Final   Shigella/Enteroinvasive E coli (EIEC) NOT DETECTED NOT DETECTED Final   Cryptosporidium NOT DETECTED NOT DETECTED Final   Cyclospora cayetanensis NOT DETECTED NOT DETECTED Final   Entamoeba histolytica NOT DETECTED NOT DETECTED Final   Giardia lamblia NOT DETECTED NOT DETECTED Final   Adenovirus F40/41 NOT DETECTED NOT DETECTED Final   Astrovirus NOT DETECTED NOT DETECTED Final   Norovirus GI/GII NOT DETECTED NOT DETECTED Final   Rotavirus A NOT DETECTED NOT DETECTED Final   Sapovirus (I, II, IV, and V) NOT DETECTED NOT DETECTED Final    Comment: Performed at [facility], [address], [place], [place] [address]     Cathlyn July, NP [facility]  03/17/2024  2:59 PM

## 2024-03-17 NOTE — Progress Notes (Signed)
 Occupational Therapy Treatment Patient Details Name: [name] MRN: [id number] DOB: 03/21/1941 Today's Date: 03/17/2024   History of present illness Pt is an 83 y.o. female admitted 8/21 had scheduled Cystoscopy & basket stone extraction at [facility], then in PM pt became lethargic, vomited, and was in un-airconditioned car ~ 15 minutes. Workup for sepsis. Recent admission for AMS d/t Enterococcus bacteremia  PMH: vascular dementia, UTI, DDD, COPD   OT comments  Pt progressing well towards goals. Progressed to complete toileting and standing ADLs with min assist. Pt limited by decreased activity tolerance requiring x2 seated rest breaks during activity. Continue to recommend HHOT to optimize independence levels. Will continue to follow acutely.       If plan is discharge home, recommend the following:  A lot of help with walking and/or transfers;A little help with bathing/dressing/bathroom   Equipment Recommendations  BSC/3in1       Precautions / Restrictions Precautions Precautions: Fall Recall of Precautions/Restrictions: Intact Precaution/Restrictions Comments: strings from recent ureteral stent procedure Restrictions Weight Bearing Restrictions Per Provider Order: No       Mobility Bed Mobility Overal bed mobility: Needs Assistance Bed Mobility: Supine to Sit, Sit to Supine     Supine to sit: HOB elevated, Used rails, Min assist Sit to supine: Min assist   General bed mobility comments: min HH assist for trunk to sit, assist with legs and cueing to return to supine    Transfers Overall transfer level: Needs assistance Equipment used: Rolling walker (2 wheels) Transfers: Sit to/from Stand Sit to Stand: Min assist           General transfer comment: minA to rise and steady, cues for hand placement     Balance Overall balance assessment: Needs assistance Sitting-balance support: Bilateral upper extremity supported, Feet supported Sitting balance-Leahy Scale:  Poor Sitting balance - Comments: CGA for balance sitting EOB Postural control: Posterior lean Standing balance support: Bilateral upper extremity supported, During functional activity Standing balance-Leahy Scale: Poor Standing balance comment: Reliant on UE support, no buckling       ADL either performed or assessed with clinical judgement   ADL Overall ADL's : Needs assistance/impaired     Lower Body Dressing: Minimal assistance;Sit to/from stand Lower Body Dressing Details (indicate cue type and reason): Min assist to thread legs and pull brief over waist in standing. BUE supported by [employer] Toilet Transfer: Contact guard assist;Rolling walker (2 wheels) Toilet Transfer Details (indicate cue type and reason): Simulated in room, short steps Toileting- Clothing Manipulation and Hygiene: Minimal assistance;Sit to/from stand Toileting - Clothing Manipulation Details (indicate cue type and reason): Pt able to perform own pericare     Functional mobility during ADLs: Minimal assistance;Rolling walker (2 wheels) General ADL Comments: Significantly decreaased activity tolerance requiring x2 seated rest breaks d/t fatigue    Extremity/Trunk Assessment Upper Extremity Assessment Upper Extremity Assessment: Generalized weakness   Lower Extremity Assessment Lower Extremity Assessment: Defer to PT evaluation        Vision   Vision Assessment?: No apparent visual deficits         Communication Communication Communication: Impaired Factors Affecting Communication: Hearing impaired   Cognition Arousal: Alert Behavior During Therapy: WFL for tasks assessed/performed Cognition: History of cognitive impairments             OT - Cognition Comments: Pt with hx of vascular dementia, overall WFL                 Following commands: Intact  Cueing   Cueing Techniques: Verbal cues        General Comments Per RN request purewick placed d/t frequent incontinence     Pertinent Vitals/ Pain       Pain Assessment Pain Assessment: Faces Faces Pain Scale: Hurts even more Pain Location: bottom Pain Descriptors / Indicators: Aching, Discomfort Pain Intervention(s): Limited activity within patient's tolerance   Frequency  Min 2X/week        Progress Toward Goals  OT Goals(current goals can now be found in the care plan section)  Progress towards OT goals: Progressing toward goals  Acute Rehab OT Goals Patient Stated Goal: to go home OT Goal Formulation: With patient Time For Goal Achievement: 03/28/24 Potential to Achieve Goals: Good ADL Goals Pt Will Perform Grooming: with supervision;standing Pt Will Perform Lower Body Dressing: with supervision;sit to/from stand Pt Will Transfer to Toilet: with supervision;ambulating;regular height toilet Pt Will Perform Toileting - Clothing Manipulation and hygiene: with supervision;sit to/from stand  Plan         AM-PAC OT 6 Clicks Daily Activity     Outcome Measure   Help from another person eating meals?: None Help from another person taking care of personal grooming?: A Little Help from another person toileting, which includes using toliet, bedpan, or urinal?: A Little Help from another person bathing (including washing, rinsing, drying)?: A Little Help from another person to put on and taking off regular upper body clothing?: A Little Help from another person to put on and taking off regular lower body clothing?: A Little 6 Click Score: 19    End of Session Equipment Utilized During Treatment: Rolling walker (2 wheels)  OT Visit Diagnosis: Unsteadiness on feet (R26.81);Other abnormalities of gait and mobility (R26.89);Muscle weakness (generalized) (M62.81)   Activity Tolerance Patient limited by fatigue   Patient Left in bed;with call bell/phone within reach;with bed alarm set   Nurse Communication Mobility status        Time: 1345-1411 OT Time Calculation (min): 26  min  Charges: OT General Charges $OT Visit: 1 Visit OT Treatments $Self Care/Home Management : 23-37 mins  Adrianne BROCKS, OT  Acute Rehabilitation Services Office [phone] Secure chat preferred   Adrianne GORMAN Savers 03/17/2024, 2:27 PM

## 2024-03-17 NOTE — Progress Notes (Signed)
 Patient resting comfortably in bed on rounds. She looks well today. I had her nurse accompany me to serve as chaperone with the intention of removing her remaining ureteral stent. The string could not be identified and neither nurse nor nurse assistant had seen it. They will contact last night's nurse to see if it was removed on the evening shift. If stent cannot be accounted for then they will collect KUB. I was told that "the wires were cut", but could not get clarification on what this meant. If stent is still in place without tether, will be removed by cystoscopy.

## 2024-03-18 DIAGNOSIS — B952 Enterococcus as the cause of diseases classified elsewhere: Secondary | ICD-10-CM | POA: Diagnosis not present

## 2024-03-18 DIAGNOSIS — R7881 Bacteremia: Secondary | ICD-10-CM | POA: Diagnosis not present

## 2024-03-18 MED ORDER — POTASSIUM CHLORIDE 20 MEQ PO PACK
40.0000 meq | PACK | Freq: Once | ORAL | Status: AC
  Administered 2024-03-18: 40 meq via ORAL
  Filled 2024-03-18: qty 2

## 2024-03-18 MED ORDER — DIPHENOXYLATE-ATROPINE 2.5-0.025 MG PO TABS
1.0000 | ORAL_TABLET | Freq: Four times a day (QID) | ORAL | Status: DC | PRN
  Administered 2024-03-18 – 2024-03-20 (×3): 1 via ORAL
  Filled 2024-03-18 (×3): qty 1

## 2024-03-18 NOTE — Plan of Care (Signed)
  Problem: Education: Goal: Knowledge of General Education information will improve Description: Including pain rating scale, medication(s)/side effects and non-pharmacologic comfort measures Outcome: Progressing   Problem: Health Behavior/Discharge Planning: Goal: Ability to manage health-related needs will improve Outcome: Progressing   Problem: Clinical Measurements: Goal: Ability to maintain clinical measurements within normal limits will improve Outcome: Progressing Goal: Diagnostic test results will improve Outcome: Progressing   Problem: Activity: Goal: Risk for activity intolerance will decrease Outcome: Progressing   Problem: Nutrition: Goal: Adequate nutrition will be maintained Outcome: Progressing   Problem: Coping: Goal: Level of anxiety will decrease Outcome: Progressing   Problem: Elimination: Goal: Will not experience complications related to bowel motility Outcome: Progressing Goal: Will not experience complications related to urinary retention Outcome: Progressing   Problem: Pain Managment: Goal: General experience of comfort will improve and/or be controlled Outcome: Progressing   Problem: Safety: Goal: Ability to remain free from injury will improve Outcome: Progressing   Problem: Skin Integrity: Goal: Risk for impaired skin integrity will decrease Outcome: Progressing

## 2024-03-18 NOTE — TOC Initial Note (Signed)
 Transition of Care [facility]) - Initial/Assessment Note    Patient Details  Name: [name] MRN: [id number] Date of Birth: 03-01-1941  Transition of Care [facility]) CM/SW Contact:    Sudie Erminio Deems, RN Phone Number: 03/18/2024, 3:08 PM  Clinical Narrative: Patient presented for weakness and decreased responsiveness. PTA patient was from home with spouse. Patient has DME: walker, shower chair, cane, and rolling walker. Patient states spouse takes her to doctors appointments. Case Manager discussed the benefit of home health services and the patient has declined services at this time-CM made MD aware. No further home needs identified at this time. Case Manager will continue to follow for additional disposition needs.                Expected Discharge Plan: Home/Self Care Barriers to Discharge: No Barriers Identified   Patient Goals and CMS Choice Patient states their goals for this hospitalization and ongoing recovery are:: Patient wants to return home without any HH Services-wants to remain independent   Choice offered to / list presented to : NA      Expected Discharge Plan and Services In-house Referral: NA Discharge Planning Services: CM Consult Post Acute Care Choice: NA Living arrangements for the past 2 months: Single Family Home                   DME Agency: NA       HH Arranged: Patient Refused HH          Prior Living Arrangements/Services Living arrangements for the past 2 months: Single Family Home Lives with:: Spouse Patient language and need for interpreter reviewed:: Yes Do you feel safe going back to the place where you live?: Yes      Need for Family Participation in Patient Care: No (Comment) Care giver support system in place?: No (comment) Current home services: DME (Walker, shower chair, cane, and rolling walker.) Criminal Activity/Legal Involvement Pertinent to Current Situation/Hospitalization: No - Comment as needed  Activities of  Daily Living   ADL Screening (condition at time of admission) Independently performs ADLs?: Yes (appropriate for developmental age) (per chart history and husband) Is the patient deaf or have difficulty hearing?: Yes Does the patient have difficulty seeing, even when wearing glasses/contacts?: Yes Does the patient have difficulty concentrating, remembering, or making decisions?: Yes  Permission Sought/Granted Permission sought to share information with : Case Manager, Family Supports                Emotional Assessment Appearance:: Appears stated age Attitude/Demeanor/Rapport: Engaged Affect (typically observed): Appropriate Orientation: : Oriented to Self, Oriented to Place, Oriented to  Time Alcohol  / Substance Use: Not Applicable Psych Involvement: No (comment)  Admission diagnosis:  Sepsis (HCC) [A41.9] Altered mental status, unspecified altered mental status type [R41.82] Sepsis, due to unspecified organism, unspecified whether acute organ dysfunction present [facility]) [A41.9] Patient Active Problem List   Diagnosis Date Noted   Bacteremia due to Enterococcus 03/12/2024   Sepsis (HCC) 03/11/2024   Acute encephalopathy 03/11/2024   ARF (acute renal failure) (HCC) 03/11/2024   Anemia 03/11/2024   Sepsis due to gram-negative urinary tract infection (HCC) 01/13/2024   Bacteremia 10/17/2023   UTI (urinary tract infection) 10/14/2023   Cerebrovascular disease 02/04/2023   Moderate vascular dementia without behavioral disturbance, psychotic disturbance, mood disturbance, or anxiety (HCC) 02/04/2023   Memory loss 12/30/2022   Unsteady gait 12/30/2022   Atopic dermatitis 02/12/2021   Gastro-esophageal reflux disease without esophagitis 02/12/2021   Gingivitis 02/12/2021  Impairment of balance 02/12/2021   Irritable bowel syndrome with diarrhea 02/12/2021   Major depressive disorder 02/12/2021   Plantar wart of right foot 02/12/2021   Urinary hesitancy 02/12/2021    Chondrodermatitis nodularis helicis of left ear 12/06/2020   Allergic urticaria 01/25/2020   Perennial allergic rhinitis 01/25/2020   Perennial allergic rhinitis 01/25/2020   Hematuria 02/22/2019   Degenerative disc disease, lumbar 04/17/2018   Degenerative scoliosis in adult patient 04/17/2018   SI joint arthritis (HCC) 04/17/2018   Degenerative disc disease, cervical 09/09/2017   Spondylolisthesis of cervical region 09/09/2017   Chronic swimmer's ear of both sides 06/03/2017   Tobacco use 05/06/2017   COPD (chronic obstructive pulmonary disease) (HCC) 05/06/2017   History of renal calculi 01/14/2017   Anxiety 09/05/2016   Renal stone 09/05/2016   Hoarseness 06/20/2016   Laryngopharyngeal reflux (LPR) 06/20/2016   PCP:  Patti Mliss LABOR, FNP Pharmacy:   [facility] - [place], [place] - [address] [place] [address] Phone: [phone] Fax: [phone]  [facility] [address] [place] [address] Phone: [phone] Fax: [phone]     Social Drivers of Health (SDOH) Social History: SDOH Screenings   Food Insecurity: Patient Unable To Answer (03/12/2024)  Housing: Patient Unable To Answer (03/12/2024)  Transportation Needs: Patient Unable To Answer (03/12/2024)  Utilities: Patient Unable To Answer (03/12/2024)  Social Connections: Patient Unable To Answer (03/12/2024)  Recent Concern: Social Connections - Moderately Isolated (01/14/2024)  Tobacco Use: High Risk (03/11/2024)   SDOH Interventions:     Readmission Risk Interventions    10/20/2023   10:53 AM 10/16/2023    1:47 PM  Readmission Risk Prevention Plan  Post Dischage Appt Complete   Medication Screening Complete   Transportation Screening Complete Complete  PCP or Specialist Appt within 5-7 Days  Complete  Home Care Screening  Complete  Medication Review (RN CM)  Complete

## 2024-03-18 NOTE — Progress Notes (Signed)
 Physical Therapy Treatment Patient Details Name: [name] MRN: [id number] DOB: 02/12/1941 Today's Date: 03/18/2024   History of Present Illness Pt is an 83 y.o. female admitted 8/21 had scheduled Cystoscopy & basket stone extraction at [facility], then in PM pt became lethargic, vomited, and was in un-airconditioned car ~ 15 minutes. Workup for sepsis. Recent admission for AMS d/t Enterococcus bacteremia  PMH: vascular dementia, UTI, DDD, COPD    PT Comments  Pt is having a hard time presently with raw bottom/perineum.  She is slow to progress given her extreme fatigue.  Emphasis on warm up ROM, work on bridging to EOB with mod, transition supine to sit with min to light moderate assist, scooting with Cga.  Pt completed STS x3 with 25 + sec each stand for peri-care in general, but also marching in place x1 trial.  Pt reports 10/10  perceived exertion, though she appears to be tired at about 8/10.    If plan is discharge home, recommend the following: A lot of help with bathing/dressing/bathroom;Assistance with cooking/housework;Assist for transportation;Help with stairs or ramp for entrance;A little help with walking and/or transfers   Can travel by private vehicle        Equipment Recommendations  Rolling walker (2 wheels)    Recommendations for Other Services       Precautions / Restrictions Precautions Precautions: Fall Recall of Precautions/Restrictions: Intact     Mobility  Bed Mobility Overal bed mobility: Needs Assistance Bed Mobility: Supine to Sit, Sit to Supine     Supine to sit: HOB elevated, Used rails, Min assist Sit to supine: Mod assist        Transfers Overall transfer level: Needs assistance Equipment used: Rolling walker (2 wheels)   Sit to Stand: Min assist (x3)           General transfer comment: cues for hand placement    Ambulation/Gait             Pre-gait activities: STS x3 with standing 25+ secs each trial due to peri care.  3rd trian  completed marching trial x 10 reps General Gait Details: unable today due to need for extensive cleanup with multiple stands.  side-stepped to [facility] with 8 little steps left.   Stairs             Wheelchair Mobility     Tilt Bed    Modified Rankin (Stroke Patients Only)       Balance Overall balance assessment: Needs assistance Sitting-balance support: Bilateral upper extremity supported, Feet supported Sitting balance-Leahy Scale: Poor Sitting balance - Comments: if no UE assist, pt falls slowly backward.     Standing balance-Leahy Scale: Poor                              Communication Communication Communication: Impaired Factors Affecting Communication: Hearing impaired  Cognition Arousal: Alert Behavior During Therapy: WFL for tasks assessed/performed   PT - Cognitive impairments: History of cognitive impairments                       PT - Cognition Comments: hx of vascular dementia, generally able to follow commands Following commands: Intact      Cueing Cueing Techniques: Verbal cues  Exercises Other Exercises Other Exercises: 10 reps of low amplitude marching in place.    General Comments        Pertinent Vitals/Pain Pain Assessment Pain Assessment: Faces Faces  Pain Scale: Hurts even more Pain Location: bottom Pain Descriptors / Indicators: Burning, Discomfort Pain Intervention(s): Monitored during session, Repositioned    Home Living                          Prior Function            PT Goals (current goals can now be found in the care plan section) Acute Rehab PT Goals Patient Stated Goal: return home PT Goal Formulation: With patient/family Time For Goal Achievement: 03/28/24 Potential to Achieve Goals: Good Progress towards PT goals: Progressing toward goals    Frequency    Min 2X/week      PT Plan      Co-evaluation              AM-PAC PT 6 Clicks Mobility   Outcome Measure   Help needed turning from your back to your side while in a flat bed without using bedrails?: A Little Help needed moving from lying on your back to sitting on the side of a flat bed without using bedrails?: A Little Help needed moving to and from a bed to a chair (including a wheelchair)?: A Little Help needed standing up from a chair using your arms (e.g., wheelchair or bedside chair)?: A Little Help needed to walk in hospital room?: A Lot Help needed climbing 3-5 steps with a railing? : Total 6 Click Score: 15    End of Session   Activity Tolerance: Patient tolerated treatment well Patient left: in bed;with call bell/phone within reach;with bed alarm set Nurse Communication: Mobility status PT Visit Diagnosis: Unsteadiness on feet (R26.81);Muscle weakness (generalized) (M62.81)     Time: 8348-8274 PT Time Calculation (min) (ACUTE ONLY): 34 min  Charges:    $Therapeutic Activity: 8-22 mins $Self Care/Home Management: 8-22 PT General Charges $$ ACUTE PT VISIT: 1 Visit                     03/18/2024  India HERO., PT Acute Rehabilitation Services [phone]  (office)   Vinie GAILS Tavarious Freel 03/18/2024, 5:37 PM

## 2024-03-18 NOTE — Progress Notes (Signed)
 PROGRESS NOTE    [name]  FMW:[id number] DOB: 1941-06-22 DOA: 03/11/2024 PCP: Patti Mliss LABOR, FNP   Brief Narrative:  This 83 y.o. female who presents from home with worsening mental status after recent bilateral ureteroscopy with laser lithotripsy,  stone extraction with left ureteral stent exchange and right ureteral stent placement.  Patient has known comorbid conditions including vascular dementia, prior TIA, chronic anemia of chronic disease, COPD without oxygen. In the ED patient noted to be febrile,  tachycardic hypotensive , meeting sepsis criteria given presumed infection of UTI with recent instrumentation.  Foley placed at intake per urology request, antibiotics initiated with broad-spectrum coverage.   Assessment & Plan:   Principal Problem:   Bacteremia due to Enterococcus Active Problems:   COPD (chronic obstructive pulmonary disease) (HCC)   Memory loss   UTI (urinary tract infection)   Sepsis (HCC)   Acute encephalopathy   ARF (acute renal failure) (HCC)   Anemia  Sepsis secondary to UTI, POA: Enterococcus bacteremia: - Recent bilateral ureteral stent placements on 8/20  with laser lithotripsy by Dr. Elisabeth. - Disseminated infection likely present prior to procedure secondary to infected nephrolithiasis. - Narrowed down antibiotics to ampicillin  per infectious disease. - Blood culture positive for Enterococcus. - TTE - 60/65% without notable vegetation - will likely need TEE in the next few days to evaluate for valvular lesions. - ID recommended no plans for TEE at this time unless repeat blood cultures positive.  - Antibiotics can be changed to oral amoxicillin  and finish 2 weeks course total duration. - Patient  still continued to have loose stools.   Acute metabolic encephalopathy secondary to sepsis> resolved. Appears to be improving slowly, follow with family given history of vascular dementia,  unclear baseline at this time.   She appears back to her  baseline mental status.   Acute kidney injury: > Resolved. - Complicated by recent hydronephrosis /obstructing nephrolithiasis, recent stent placements and urological procedure.  - Foley removed, stents will remain in place for now - Creatinine continues to downtrend appropriately - Continue IV fluids, advance diet as tolerated, follow repeat labs.   Hypocalcemia: - Acute on chronic, likely at least partially hemodilutional  -Continue to follow, no signs or symptoms of clinical hypocalcemia.   History of dementia: Continue donepezil , memantine .   Chronic normocytic anemia, likely anemia chronic disease given above  - No acute signs or symptoms of bleeding, follow repeat labs - Iron panel consistent with mixed chronic disease anemia as well as for iron deficiency given normal ferritin.   Prior history of TIA : Continue aspirin .  COPD noted in the chart presently not wheezing.  Hypokalemia: Replaced.  Continue to monitor.   DVT prophylaxis: Heparin  sq Code Status:Full code Family Communication: No family at bed side. Disposition Plan:  Status is: Inpatient Remains inpatient appropriate because:   Severity of illness   Consultants:  Urology Infectious Diseases.  Procedures:  Antimicrobials:  Anti-infectives (From admission, onward)    Start     Dose/Rate Route Frequency Ordered Stop   03/16/24 1200  ampicillin  (OMNIPEN) 2 g in sodium chloride  0.9 % 100 mL IVPB        2 g 300 mL/hr over 20 Minutes Intravenous Every 6 hours 03/16/24 0915     03/13/24 2000  vancomycin  (VANCOREADY) IVPB 750 mg/150 mL  Status:  Discontinued        750 mg 150 mL/hr over 60 Minutes Intravenous Every 48 hours 03/11/24 2155 03/12/24 1405   03/12/24 2200  ampicillin  (OMNIPEN) 2 g in sodium chloride  0.9 % 100 mL IVPB  Status:  Discontinued        2 g 300 mL/hr over 20 Minutes Intravenous Every 8 hours 03/12/24 1405 03/16/24 0915   03/12/24 0400  piperacillin -tazobactam (ZOSYN ) IVPB 3.375 g   Status:  Discontinued        3.375 g 12.5 mL/hr over 240 Minutes Intravenous Every 8 hours 03/11/24 2155 03/12/24 1405   03/11/24 1900  vancomycin  (VANCOCIN ) IVPB 1000 mg/200 mL premix        1,000 mg 200 mL/hr over 60 Minutes Intravenous  Once 03/11/24 1845 03/11/24 2135   03/11/24 1830  ampicillin  (OMNIPEN) 1 g in sodium chloride  0.9 % 100 mL IVPB  Status:  Discontinued        1 g 300 mL/hr over 20 Minutes Intravenous  Once 03/11/24 1815 03/11/24 1821   03/11/24 1830  piperacillin -tazobactam (ZOSYN ) IVPB 3.375 g        3.375 g 100 mL/hr over 30 Minutes Intravenous  Once 03/11/24 1821 03/11/24 2020       Subjective: Patient was seen and examined at bedside. Overnight events noted. Patient still has loose stools,  reports feeling weak,  otherwise improving. Ureteral stents were displaced and removed.  Objective: Vitals:   03/18/24 0400 03/18/24 0404 03/18/24 0817 03/18/24 1146  BP: (!) 121/45 (!) 121/45 (!) 127/51 124/64  Pulse: 72 73 83 75  Resp:  18 17 20   Temp:  98.8 F (37.1 C) 98 F (36.7 C) 98.4 F (36.9 C)  TempSrc:  Oral Oral Axillary  SpO2: (!) 89% 91% 93% 97%  Weight:      Height:        Intake/Output Summary (Last 24 hours) at 03/18/2024 1223 Last data filed at 03/18/2024 0948 Gross per 24 hour  Intake 480 ml  Output 500 ml  Net -20 ml   Filed Weights   03/11/24 1726 03/11/24 2159 03/12/24 0500  Weight: 42.6 kg 45.9 kg 45.6 kg    Examination:  General exam: Appears comfortable, severely deconditioned, not in any acute distress. Respiratory system: CTA Bilaterally. Respiratory effort normal. RR 13 Cardiovascular system: S1 & S2 heard, RRR. No JVD, murmurs, rubs, gallops or clicks.  Gastrointestinal system: Abdomen is non distended, soft and non tender. Normal bowel sounds heard. Central nervous system: Alert and oriented x 3. No focal neurological deficits. Extremities: No edema, no cyanosis, no clubbing. Skin: No rashes, lesions or ulcers Psychiatry:  Judgement and insight appear normal. Mood & affect appropriate.     Data Reviewed: I have personally reviewed following labs and imaging studies  CBC: Recent Labs  Lab 03/11/24 1748 03/11/24 1753 03/11/24 2231 03/12/24 0525 03/13/24 0507 03/15/24 0533 03/17/24 0551  WBC 16.1*  --  24.9* 24.1* 11.1* 10.3 10.7*  NEUTROABS 15.1*  --   --   --   --   --   --   HGB 10.0*   < > 9.7* 9.4* 8.3* 7.9* 8.1*  HCT 30.9*   < > 29.3* 27.4* 25.4* 23.6* 24.4*  MCV 94.5  --  92.4 90.7 92.4 91.8 90.7  PLT 296  --  266 267 239 234 289   < > = values in this interval not displayed.   Basic Metabolic Panel: Recent Labs  Lab 03/11/24 1748 03/11/24 1753 03/11/24 2231 03/12/24 0525 03/13/24 0507 03/15/24 0533 03/16/24 0602 03/17/24 0551  NA 139 139  --  137 138 138  --  138  K 3.7 4.0  --  3.4* 3.4* 3.2*  --  3.2*  CL 108  --   --  106 110 108  --  109  CO2 23  --   --  24 25 25   --  23  GLUCOSE 94  --   --  131* 114* 113*  --  107*  BUN 21  --   --  20 18 18   --  21  CREATININE 1.02*  --  1.25* 1.20* 1.09* 0.86  --  0.81  CALCIUM 7.3*  --   --  8.1* 7.9* 8.3*  --  8.5*  MG  --   --   --   --   --   --  1.8  --   PHOS  --   --   --   --   --   --  3.0  --    GFR: Estimated Creatinine Clearance: 33.4 mL/min (by C-G formula based on SCr of 0.81 mg/dL). Liver Function Tests: Recent Labs  Lab 03/11/24 1748 03/12/24 0525  AST 22 25  ALT 28 13  ALKPHOS 49 42  BILITOT 0.8 0.4  PROT 5.4* 5.6*  ALBUMIN 2.4* 2.4*   No results for input(s): LIPASE, AMYLASE in the last 168 hours. No results for input(s): AMMONIA in the last 168 hours. Coagulation Profile: No results for input(s): INR, PROTIME in the last 168 hours. Cardiac Enzymes: Recent Labs  Lab 03/11/24 1748  CKTOTAL 86   BNP (last 3 results) No results for input(s): PROBNP in the last 8760 hours. HbA1C: No results for input(s): HGBA1C in the last 72 hours. CBG: Recent Labs  Lab 03/13/24 2354 03/14/24 0822  03/14/24 1626 03/16/24 0348 03/16/24 1342  GLUCAP 140* 125* 112* 118* 146*   Lipid Profile: No results for input(s): CHOL, HDL, LDLCALC, TRIG, CHOLHDL, LDLDIRECT in the last 72 hours. Thyroid  Function Tests: No results for input(s): TSH, T4TOTAL, FREET4, T3FREE, THYROIDAB in the last 72 hours. Anemia Panel: No results for input(s): VITAMINB12, FOLATE, FERRITIN, TIBC, IRON, RETICCTPCT in the last 72 hours.  Sepsis Labs: Recent Labs  Lab 03/11/24 1754  LATICACIDVEN 1.4    Recent Results (from the past 240 hours)  Blood culture (routine x 2)     Status: Abnormal   Collection Time: 03/11/24  7:05 PM   Specimen: BLOOD  Result Value Ref Range Status   Specimen Description BLOOD LEFT ANTECUBITAL  Final   Special Requests   Final    BOTTLES DRAWN AEROBIC AND ANAEROBIC Blood Culture adequate volume   Culture  Setup Time   Final    GRAM POSITIVE COCCI IN CHAINS ANAEROBIC BOTTLE ONLY CRITICAL RESULT CALLED TO, READ BACK BY AND VERIFIED WITH: PHARMD KARLEN BROVEY ON 03/12/24 @ 1351 BY DRT Performed at [facility], [address] [place], [place] [address]    Culture ENTEROCOCCUS FAECALIS (A)  Final   Report Status 03/14/2024 FINAL  Final   Organism ID, Bacteria ENTEROCOCCUS FAECALIS  Final      Susceptibility   Enterococcus faecalis - MIC*    AMPICILLIN  <=2 SENSITIVE Sensitive     VANCOMYCIN  1 SENSITIVE Sensitive     GENTAMICIN SYNERGY SENSITIVE Sensitive     * ENTEROCOCCUS FAECALIS  Blood Culture ID Panel (Reflexed)     Status: Abnormal   Collection Time: 03/11/24  7:05 PM  Result Value Ref Range Status   Enterococcus faecalis DETECTED (A) NOT DETECTED Final    Comment: CRITICAL RESULT CALLED TO, READ BACK BY AND VERIFIED WITH:  PHARMD KARLEN BROVEY ON 03/12/24 @ 1351 BY DRT    Enterococcus Faecium NOT DETECTED NOT DETECTED Final   Listeria monocytogenes NOT DETECTED NOT DETECTED Final   Staphylococcus species NOT DETECTED NOT DETECTED  Final   Staphylococcus aureus (BCID) NOT DETECTED NOT DETECTED Final   Staphylococcus epidermidis NOT DETECTED NOT DETECTED Final   Staphylococcus lugdunensis NOT DETECTED NOT DETECTED Final   Streptococcus species NOT DETECTED NOT DETECTED Final   Streptococcus agalactiae NOT DETECTED NOT DETECTED Final   Streptococcus pneumoniae NOT DETECTED NOT DETECTED Final   Streptococcus pyogenes NOT DETECTED NOT DETECTED Final   A.calcoaceticus-baumannii NOT DETECTED NOT DETECTED Final   Bacteroides fragilis NOT DETECTED NOT DETECTED Final   Enterobacterales NOT DETECTED NOT DETECTED Final   Enterobacter cloacae complex NOT DETECTED NOT DETECTED Final   Escherichia coli NOT DETECTED NOT DETECTED Final   Klebsiella aerogenes NOT DETECTED NOT DETECTED Final   Klebsiella oxytoca NOT DETECTED NOT DETECTED Final   Klebsiella pneumoniae NOT DETECTED NOT DETECTED Final   Proteus species NOT DETECTED NOT DETECTED Final   Salmonella species NOT DETECTED NOT DETECTED Final   Serratia marcescens NOT DETECTED NOT DETECTED Final   Haemophilus influenzae NOT DETECTED NOT DETECTED Final   Neisseria meningitidis NOT DETECTED NOT DETECTED Final   Pseudomonas aeruginosa NOT DETECTED NOT DETECTED Final   Stenotrophomonas maltophilia NOT DETECTED NOT DETECTED Final   Candida albicans NOT DETECTED NOT DETECTED Final   Candida auris NOT DETECTED NOT DETECTED Final   Candida glabrata NOT DETECTED NOT DETECTED Final   Candida krusei NOT DETECTED NOT DETECTED Final   Candida parapsilosis NOT DETECTED NOT DETECTED Final   Candida tropicalis NOT DETECTED NOT DETECTED Final   Cryptococcus neoformans/gattii NOT DETECTED NOT DETECTED Final   Vancomycin  resistance NOT DETECTED NOT DETECTED Final    Comment: Performed at [facility], [address] [place], [place] [address]  Blood culture (routine x 2)     Status: Abnormal   Collection Time: 03/11/24  7:09 PM   Specimen: BLOOD  Result Value Ref Range Status    Specimen Description BLOOD RIGHT ANTECUBITAL  Final   Special Requests   Final    BOTTLES DRAWN AEROBIC AND ANAEROBIC Blood Culture results may not be optimal due to an inadequate volume of blood received in culture bottles   Culture  Setup Time   Final    GRAM POSITIVE COCCI IN CHAINS IN PAIRS AEROBIC BOTTLE ONLY CRITICAL VALUE NOTED.  VALUE IS CONSISTENT WITH PREVIOUSLY REPORTED AND CALLED VALUE.    Culture (A)  Final    ENTEROCOCCUS FAECALIS SUSCEPTIBILITIES PERFORMED ON PREVIOUS CULTURE WITHIN THE LAST 5 DAYS. Performed at [facility], [address], [place], [place] [address]    Report Status 03/15/2024 FINAL  Final  Urine Culture     Status: Abnormal   Collection Time: 03/12/24  4:45 AM   Specimen: Urine, Clean Catch  Result Value Ref Range Status   Specimen Description URINE, CLEAN CATCH  Final   Special Requests NONE  Final   Culture (A)  Final    <10,000 COLONIES/mL INSIGNIFICANT GROWTH Performed at [facility], [address], [place], [place] [address]    Report Status 03/13/2024 FINAL  Final  Culture, blood (Routine X 2) w Reflex to ID Panel     Status: None (Preliminary result)   Collection Time: 03/13/24  5:07 AM   Specimen: BLOOD  Result Value Ref Range Status   Specimen Description BLOOD BLOOD  LEFT ARM  Final   Special Requests   Final    BOTTLES DRAWN AEROBIC AND ANAEROBIC Blood Culture adequate volume   Culture   Final    NO GROWTH 4 DAYS Performed at [facility], [address] [place], [place] [address]    Report Status PENDING  Incomplete  C Difficile Quick Screen w PCR reflex     Status: None   Collection Time: 03/13/24 10:48 AM   Specimen: Stool  Result Value Ref Range Status   C Diff antigen NEGATIVE NEGATIVE Final   C Diff toxin NEGATIVE NEGATIVE Final   C Diff interpretation No C. difficile detected.  Final    Comment: Performed at [facility], [address] [place], [place] [address]  Gastrointestinal Panel by PCR  , Stool     Status: None   Collection Time: 03/13/24  2:30 PM   Specimen: Stool  Result Value Ref Range Status   Campylobacter species NOT DETECTED NOT DETECTED Final   Plesimonas shigelloides NOT DETECTED NOT DETECTED Final   Salmonella species NOT DETECTED NOT DETECTED Final   Yersinia enterocolitica NOT DETECTED NOT DETECTED Final   Vibrio species NOT DETECTED NOT DETECTED Final   Vibrio cholerae NOT DETECTED NOT DETECTED Final   Enteroaggregative E coli (EAEC) NOT DETECTED NOT DETECTED Final   Enteropathogenic E coli (EPEC) NOT DETECTED NOT DETECTED Final   Enterotoxigenic E coli (ETEC) NOT DETECTED NOT DETECTED Final   Shiga like toxin producing E coli (STEC) NOT DETECTED NOT DETECTED Final   Shigella/Enteroinvasive E coli (EIEC) NOT DETECTED NOT DETECTED Final   Cryptosporidium NOT DETECTED NOT DETECTED Final   Cyclospora cayetanensis NOT DETECTED NOT DETECTED Final   Entamoeba histolytica NOT DETECTED NOT DETECTED Final   Giardia lamblia NOT DETECTED NOT DETECTED Final   Adenovirus F40/41 NOT DETECTED NOT DETECTED Final   Astrovirus NOT DETECTED NOT DETECTED Final   Norovirus GI/GII NOT DETECTED NOT DETECTED Final   Rotavirus A NOT DETECTED NOT DETECTED Final   Sapovirus (I, II, IV, and V) NOT DETECTED NOT DETECTED Final    Comment: Performed at [facility], [address], [place], [place] [address]    Radiology Studies: No results found.  Scheduled Meds:  artificial tears  1 drop Both Eyes q morning   Chlorhexidine  Gluconate Cloth  6 each Topical Daily   donepezil   10 mg Oral q morning   escitalopram   20 mg Oral q morning   feeding supplement  237 mL Oral BID BM   Gerhardt's butt cream   Topical BID   heparin  injection (subcutaneous)  5,000 Units Subcutaneous Q8H   memantine   5 mg Oral BID   mirabegron  ER  50 mg Oral q morning   nicotine   21 mg Transdermal Daily   Continuous Infusions:  ampicillin  (OMNIPEN) IV 2 g (03/18/24 1209)     LOS: 7 days     Time spent: 35 mins    Darcel Dawley, MD [facility]   If 7PM-7AM, please contact night-coverage

## 2024-03-18 NOTE — Progress Notes (Signed)
     Subjective: [name].  Patient resting comfortably in bed on rounds.  Objective: Vital signs in last 24 hours: Temp:  [98 F (36.7 C)-98.8 F (37.1 C)] 98 F (36.7 C) (08/28 0817) Pulse Rate:  [72-91] 83 (08/28 0817) Resp:  [16-18] 17 (08/28 0817) BP: (121-129)/(45-56) 127/51 (08/28 0817) SpO2:  [89 %-99 %] 93 % (08/28 0817)  Assessment/Plan: # 9 mm UPJ calculus Ureteroscopy and laser lithotripsy with Dr. Elisabeth on 03/11/2024.  9 mm left and nonobstructing 6 mm right stone were addressed. Bilateral ureteral stents were left behind with tethers. Tethered stents inadvertently pulled during patient care on 8/26 and 8/27 respectively.  Patient has no discomfort, adequate UOP, and normal serum creatinine. Follow-up with urology as needed.  Urology will sign off at this time.  Please call with questions  Intake/Output from previous day: 08/27 0701 - 08/28 0700 In: 240 [P.O.:240] Out: 500 [Urine:500]  Intake/Output this shift: No intake/output data recorded.  Physical Exam:  General: Alert and oriented CV: No cyanosis Lungs: equal chest rise Abdomen: Soft, NTND, no rebound or guarding Gu: Both ureteral stents removed.  Lab Results: Recent Labs    03/17/24 0551  HGB 8.1*  HCT 24.4*   BMET Recent Labs    03/17/24 0551  NA 138  K 3.2*  CL 109  CO2 23  GLUCOSE 107*  BUN 21  CREATININE 0.81  CALCIUM 8.5*  HGB 8.1*  WBC 10.7*     Studies/Results: No results found.    LOS: 7 days   Ole Bourdon, NP [facility] Pager: [phone]  03/18/2024, 10:45 AM

## 2024-03-19 DIAGNOSIS — B952 Enterococcus as the cause of diseases classified elsewhere: Secondary | ICD-10-CM | POA: Diagnosis not present

## 2024-03-19 DIAGNOSIS — R7881 Bacteremia: Secondary | ICD-10-CM | POA: Diagnosis not present

## 2024-03-19 LAB — BASIC METABOLIC PANEL WITH GFR
Anion gap: 9 (ref 5–15)
BUN: 20 mg/dL (ref 8–23)
CO2: 23 mmol/L (ref 22–32)
Calcium: 8.4 mg/dL — ABNORMAL LOW (ref 8.9–10.3)
Chloride: 107 mmol/L (ref 98–111)
Creatinine, Ser: 0.81 mg/dL (ref 0.44–1.00)
GFR, Estimated: >60 mL/min (ref >60)
Glucose, Bld: 113 mg/dL — ABNORMAL HIGH (ref 70–99)
Potassium: 3.5 mmol/L (ref 3.5–5.1)
Sodium: 139 mmol/L (ref 135–145)

## 2024-03-19 LAB — CBC
HCT: 23.5 % — ABNORMAL LOW (ref 36.0–46.0)
Hemoglobin: 7.8 g/dL — ABNORMAL LOW (ref 12.0–15.0)
MCH: 30.1 pg (ref 26.0–34.0)
MCHC: 33.2 g/dL (ref 30.0–36.0)
MCV: 90.7 fL (ref 80.0–100.0)
Platelets: 353 K/uL (ref 150–400)
RBC: 2.59 MIL/uL — ABNORMAL LOW (ref 3.87–5.11)
RDW: 14.7 % (ref 11.5–15.5)
WBC: 14.6 K/uL — ABNORMAL HIGH (ref 4.0–10.5)
nRBC: 0 % (ref 0.0–0.2)

## 2024-03-19 LAB — CULTURE, BLOOD (ROUTINE X 2)
Culture: NO GROWTH
Special Requests: ADEQUATE

## 2024-03-19 MED ORDER — ORAL CARE MOUTH RINSE: 15.0000 mL | OROMUCOSAL | Status: DC | PRN

## 2024-03-19 NOTE — Progress Notes (Signed)
 Physical Therapy Treatment Patient Details Name: [name] MRN: [id number] DOB: 1940/10/18 Today's Date: 03/19/2024   History of Present Illness Pt is an 83 y.o. female admitted 8/21 had scheduled Cystoscopy & basket stone extraction at [facility], then in PM pt became lethargic, vomited, and was in un-airconditioned car ~ 15 minutes. Workup for sepsis. Recent admission for AMS d/t Enterococcus bacteremia  PMH: vascular dementia, UTI, DDD, COPD    PT Comments  Pt still unable to each, but has been getting ensure down.  Pt actually progressed well toward goals today. Emphasis on strengthening/warm up LE and UE exercise, transition to sitting, STS x4 and gait with RW and IV pole and minimal assist overall with some light mod assist.     If plan is discharge home, recommend the following: A little help with walking and/or transfers;A little help with bathing/dressing/bathroom;Assistance with cooking/housework;Assist for transportation;Help with stairs or ramp for entrance   Can travel by private vehicle        Equipment Recommendations  Rolling walker (2 wheels)    Recommendations for Other Services       Precautions / Restrictions Precautions Precautions: Fall Recall of Precautions/Restrictions: Intact     Mobility  Bed Mobility Overal bed mobility: Needs Assistance Bed Mobility: Sidelying to Sit, Sit to Supine   Sidelying to sit: Min assist   Sit to supine: Mod assist        Transfers Overall transfer level: Needs assistance   Transfers: Sit to/from Stand Sit to Stand: Min assist (x4)           General transfer comment: cues for hand placement /safety    Ambulation/Gait Ambulation/Gait assistance: Min assist Gait Distance (Feet): 4 Feet (F/B x2 with RW then 150 feet with IV pole) Assistive device: IV Pole, Rolling walker (2 wheels) Gait Pattern/deviations: Step-through pattern   Gait velocity interpretation: <1.31 ft/sec, indicative of household ambulator    General Gait Details: min stability assist with minor instability, occ L hip pain spikes, periods of antagia.   Stairs             Wheelchair Mobility     Tilt Bed    Modified Rankin (Stroke Patients Only)       Balance   Sitting-balance support: No upper extremity supported, Feet supported Sitting balance-Leahy Scale: Fair     Standing balance support: Bilateral upper extremity supported, Single extremity supported Standing balance-Leahy Scale: Poor Standing balance comment: Reliant on UE support, no buckling                            Communication Communication Factors Affecting Communication: Hearing impaired  Cognition Arousal: Alert Behavior During Therapy: WFL for tasks assessed/performed   PT - Cognitive impairments: History of cognitive impairments                       PT - Cognition Comments: hx of vascular dementia, generally able to follow commands Following commands: Intact      Cueing Cueing Techniques: Verbal cues  Exercises Other Exercises Other Exercises: hip/knee flex/ext with graded resistance x 20 reps Other Exercises: bicep /tricep presses with graded resistance x 10 reps bil    General Comments General comments (skin integrity, edema, etc.): VSS, sats 95% on RA and HR in the 80's all with activity      Pertinent Vitals/Pain Pain Assessment Pain Assessment: Faces Faces Pain Scale: Hurts even more Pain Location: hip/bottom Pain  Descriptors / Indicators: Discomfort Pain Intervention(s): Monitored during session    Home Living                          Prior Function            PT Goals (current goals can now be found in the care plan section) Acute Rehab PT Goals Patient Stated Goal: return home PT Goal Formulation: With patient/family Time For Goal Achievement: 03/28/24 Potential to Achieve Goals: Good Progress towards PT goals: Progressing toward goals    Frequency    Min  2X/week      PT Plan      Co-evaluation              AM-PAC PT 6 Clicks Mobility   Outcome Measure  Help needed turning from your back to your side while in a flat bed without using bedrails?: A Little Help needed moving from lying on your back to sitting on the side of a flat bed without using bedrails?: A Little Help needed moving to and from a bed to a chair (including a wheelchair)?: A Little Help needed standing up from a chair using your arms (e.g., wheelchair or bedside chair)?: A Little Help needed to walk in hospital room?: A Little Help needed climbing 3-5 steps with a railing? : Total 6 Click Score: 16    End of Session Equipment Utilized During Treatment: Gait belt Activity Tolerance: Patient tolerated treatment well Patient left: in bed;with call bell/phone within reach;with bed alarm set Nurse Communication: Mobility status PT Visit Diagnosis: Unsteadiness on feet (R26.81);Muscle weakness (generalized) (M62.81)     Time: 8269-8241 PT Time Calculation (min) (ACUTE ONLY): 28 min  Charges:    $Gait Training: 8-22 mins $Therapeutic Exercise: 8-22 mins PT General Charges $$ ACUTE PT VISIT: 1 Visit                     03/19/2024  India HERO., PT Acute Rehabilitation Services [phone]  (office)   Vinie GAILS Ustin Cruickshank 03/19/2024, 6:17 PM

## 2024-03-19 NOTE — Progress Notes (Signed)
 PROGRESS NOTE    [name]  FMW:[id number] DOB: 03/09/1941 DOA: 03/11/2024 PCP: Patti Mliss LABOR, FNP   Brief Narrative:  This 83 y.o. female who presents from home with worsening mental status after recent bilateral ureteroscopy with laser lithotripsy,  stone extraction with left ureteral stent exchange and right ureteral stent placement.  Patient has known comorbid conditions including vascular dementia, prior TIA, chronic anemia of chronic disease, COPD without oxygen. In the ED patient noted to be febrile,  tachycardic hypotensive , meeting sepsis criteria given presumed infection of UTI with recent instrumentation.  Foley placed at intake per urology request, antibiotics initiated with broad-spectrum coverage.   Assessment & Plan:   Principal Problem:   Bacteremia due to Enterococcus Active Problems:   COPD (chronic obstructive pulmonary disease) (HCC)   Memory loss   UTI (urinary tract infection)   Sepsis (HCC)   Acute encephalopathy   ARF (acute renal failure) (HCC)   Anemia  Sepsis secondary to UTI, POA: Enterococcus bacteremia: - Recent bilateral ureteral stent placements on 8/20 with laser lithotripsy by Dr. Elisabeth. - Disseminated infection likely present prior to procedure secondary to infected nephrolithiasis. - Narrowed down antibiotics to ampicillin  per infectious disease. - Blood culture positive for Enterococcus. - TTE - 60/65% without notable vegetation - will likely need TEE in the next few days to evaluate for valvular lesions. - ID recommended no plans for TEE at this time unless repeat blood cultures positive.  - Antibiotics can be changed to oral amoxicillin  and finish 2 weeks course total duration. - Patient still continued to have loose stools. She spiked fever 102.7 yesterday.   Acute metabolic encephalopathy secondary to sepsis > resolved. Appears to be improving slowly, follow with family given history of vascular dementia,  unclear baseline at this  time.   She appears back to her baseline mental status.   Acute kidney injury: > Resolved. - Complicated by recent hydronephrosis /obstructing nephrolithiasis, recent stent placements and urological procedure.  - Foley removed, stents will remain in place for now - Creatinine continues to downtrend appropriately - Continue IV fluids, advance diet as tolerated, follow repeat labs.   Hypocalcemia: - Acute on chronic, likely at least partially hemodilutional  -Continue to follow, no signs or symptoms of clinical hypocalcemia.   History of dementia: Continue donepezil , memantine .   Chronic normocytic anemia, likely anemia chronic disease given above  - No acute signs or symptoms of bleeding, follow repeat labs - Iron panel consistent with mixed chronic disease anemia as well as for iron deficiency given normal ferritin.   Prior history of TIA : Continue aspirin .  COPD noted in the chart presently not wheezing.  Hypokalemia: Replaced.  Continue to monitor.   DVT prophylaxis: Heparin  sq Code Status:Full code Family Communication: No family at bed side. Disposition Plan:  Status is: Inpatient Remains inpatient appropriate because:   Medically ready for discharge but has spiked fever.  Will monitor next 24 hours if fever free she can be discharged.   Consultants:  Urology Infectious Diseases.  Procedures:  Antimicrobials:  Anti-infectives (From admission, onward)    Start     Dose/Rate Route Frequency Ordered Stop   03/16/24 1200  ampicillin  (OMNIPEN) 2 g in sodium chloride  0.9 % 100 mL IVPB        2 g 300 mL/hr over 20 Minutes Intravenous Every 6 hours 03/16/24 0915     03/13/24 2000  vancomycin  (VANCOREADY) IVPB 750 mg/150 mL  Status:  Discontinued  750 mg 150 mL/hr over 60 Minutes Intravenous Every 48 hours 03/11/24 2155 03/12/24 1405   03/12/24 2200  ampicillin  (OMNIPEN) 2 g in sodium chloride  0.9 % 100 mL IVPB  Status:  Discontinued        2 g 300 mL/hr over  20 Minutes Intravenous Every 8 hours 03/12/24 1405 03/16/24 0915   03/12/24 0400  piperacillin -tazobactam (ZOSYN ) IVPB 3.375 g  Status:  Discontinued        3.375 g 12.5 mL/hr over 240 Minutes Intravenous Every 8 hours 03/11/24 2155 03/12/24 1405   03/11/24 1900  vancomycin  (VANCOCIN ) IVPB 1000 mg/200 mL premix        1,000 mg 200 mL/hr over 60 Minutes Intravenous  Once 03/11/24 1845 03/11/24 2135   03/11/24 1830  ampicillin  (OMNIPEN) 1 g in sodium chloride  0.9 % 100 mL IVPB  Status:  Discontinued        1 g 300 mL/hr over 20 Minutes Intravenous  Once 03/11/24 1815 03/11/24 1821   03/11/24 1830  piperacillin -tazobactam (ZOSYN ) IVPB 3.375 g        3.375 g 100 mL/hr over 30 Minutes Intravenous  Once 03/11/24 1821 03/11/24 2020       Subjective: Patient was seen and examined at bedside. Overnight events noted. Patient still has loose stools,  reports feeling weak,  otherwise improving. Ureteral stents were displaced and removed.  She has spiked fever yesterday evening.  Objective: Vitals:   03/18/24 2140 03/19/24 0011 03/19/24 0406 03/19/24 0815  BP:  (!) 109/49 (!) 119/46 (!) 127/55  Pulse:  72 70 79  Resp:  18 20 19   Temp: 99.2 F (37.3 C) 98.3 F (36.8 C) 98.3 F (36.8 C) 98.2 F (36.8 C)  TempSrc: Oral Oral Oral Oral  SpO2:  93% 93% 96%  Weight:      Height:        Intake/Output Summary (Last 24 hours) at 03/19/2024 1117 Last data filed at 03/19/2024 0900 Gross per 24 hour  Intake 1324.69 ml  Output --  Net 1324.69 ml   Filed Weights   03/11/24 1726 03/11/24 2159 03/12/24 0500  Weight: 42.6 kg 45.9 kg 45.6 kg    Examination:  General exam: Appears comfortable, severely deconditioned, not in any acute distress. Respiratory system: CTA Bilaterally. Respiratory effort normal. RR 14 Cardiovascular system: S1 & S2 heard, RRR. No JVD, murmurs, rubs, gallops or clicks.  Gastrointestinal system: Abdomen is non distended, soft and non tender. Normal bowel sounds  heard. Central nervous system: Alert and oriented x 3. No focal neurological deficits. Extremities: No edema, no cyanosis, no clubbing. Skin: No rashes, lesions or ulcers Psychiatry: Judgement and insight appear normal. Mood & affect appropriate.     Data Reviewed: I have personally reviewed following labs and imaging studies  CBC: Recent Labs  Lab 03/13/24 0507 03/15/24 0533 03/17/24 0551 03/19/24 0452  WBC 11.1* 10.3 10.7* 14.6*  HGB 8.3* 7.9* 8.1* 7.8*  HCT 25.4* 23.6* 24.4* 23.5*  MCV 92.4 91.8 90.7 90.7  PLT 239 234 289 353   Basic Metabolic Panel: Recent Labs  Lab 03/13/24 0507 03/15/24 0533 03/16/24 0602 03/17/24 0551 03/19/24 0452  NA 138 138  --  138 139  K 3.4* 3.2*  --  3.2* 3.5  CL 110 108  --  109 107  CO2 25 25  --  23 23  GLUCOSE 114* 113*  --  107* 113*  BUN 18 18  --  21 20  CREATININE 1.09* 0.86  --  0.81 0.81  CALCIUM 7.9* 8.3*  --  8.5* 8.4*  MG  --   --  1.8  --   --   PHOS  --   --  3.0  --   --    GFR: Estimated Creatinine Clearance: 33.4 mL/min (by C-G formula based on SCr of 0.81 mg/dL). Liver Function Tests: No results for input(s): AST, ALT, ALKPHOS, BILITOT, PROT, ALBUMIN in the last 168 hours.  No results for input(s): LIPASE, AMYLASE in the last 168 hours. No results for input(s): AMMONIA in the last 168 hours. Coagulation Profile: No results for input(s): INR, PROTIME in the last 168 hours. Cardiac Enzymes: No results for input(s): CKTOTAL, CKMB, CKMBINDEX, TROPONINI in the last 168 hours.  BNP (last 3 results) No results for input(s): PROBNP in the last 8760 hours. HbA1C: No results for input(s): HGBA1C in the last 72 hours. CBG: Recent Labs  Lab 03/13/24 2354 03/14/24 0822 03/14/24 1626 03/16/24 0348 03/16/24 1342  GLUCAP 140* 125* 112* 118* 146*   Lipid Profile: No results for input(s): CHOL, HDL, LDLCALC, TRIG, CHOLHDL, LDLDIRECT in the last 72 hours. Thyroid  Function  Tests: No results for input(s): TSH, T4TOTAL, FREET4, T3FREE, THYROIDAB in the last 72 hours. Anemia Panel: No results for input(s): VITAMINB12, FOLATE, FERRITIN, TIBC, IRON, RETICCTPCT in the last 72 hours.  Sepsis Labs: No results for input(s): PROCALCITON, LATICACIDVEN in the last 168 hours.   Recent Results (from the past 240 hours)  Blood culture (routine x 2)     Status: Abnormal   Collection Time: 03/11/24  7:05 PM   Specimen: BLOOD  Result Value Ref Range Status   Specimen Description BLOOD LEFT ANTECUBITAL  Final   Special Requests   Final    BOTTLES DRAWN AEROBIC AND ANAEROBIC Blood Culture adequate volume   Culture  Setup Time   Final    GRAM POSITIVE COCCI IN CHAINS ANAEROBIC BOTTLE ONLY CRITICAL RESULT CALLED TO, READ BACK BY AND VERIFIED WITH: PHARMD KARLEN BROVEY ON 03/12/24 @ 1351 BY DRT Performed at [facility], [address] [place], [place] [address]    Culture ENTEROCOCCUS FAECALIS (A)  Final   Report Status 03/14/2024 FINAL  Final   Organism ID, Bacteria ENTEROCOCCUS FAECALIS  Final      Susceptibility   Enterococcus faecalis - MIC*    AMPICILLIN  <=2 SENSITIVE Sensitive     VANCOMYCIN  1 SENSITIVE Sensitive     GENTAMICIN SYNERGY SENSITIVE Sensitive     * ENTEROCOCCUS FAECALIS  Blood Culture ID Panel (Reflexed)     Status: Abnormal   Collection Time: 03/11/24  7:05 PM  Result Value Ref Range Status   Enterococcus faecalis DETECTED (A) NOT DETECTED Final    Comment: CRITICAL RESULT CALLED TO, READ BACK BY AND VERIFIED WITH: PHARMD KARLEN BROVEY ON 03/12/24 @ 1351 BY DRT    Enterococcus Faecium NOT DETECTED NOT DETECTED Final   Listeria monocytogenes NOT DETECTED NOT DETECTED Final   Staphylococcus species NOT DETECTED NOT DETECTED Final   Staphylococcus aureus (BCID) NOT DETECTED NOT DETECTED Final   Staphylococcus epidermidis NOT DETECTED NOT DETECTED Final   Staphylococcus lugdunensis NOT DETECTED NOT DETECTED Final    Streptococcus species NOT DETECTED NOT DETECTED Final   Streptococcus agalactiae NOT DETECTED NOT DETECTED Final   Streptococcus pneumoniae NOT DETECTED NOT DETECTED Final   Streptococcus pyogenes NOT DETECTED NOT DETECTED Final   A.calcoaceticus-baumannii NOT DETECTED NOT DETECTED Final   Bacteroides fragilis NOT DETECTED NOT DETECTED Final   Enterobacterales  NOT DETECTED NOT DETECTED Final   Enterobacter cloacae complex NOT DETECTED NOT DETECTED Final   Escherichia coli NOT DETECTED NOT DETECTED Final   Klebsiella aerogenes NOT DETECTED NOT DETECTED Final   Klebsiella oxytoca NOT DETECTED NOT DETECTED Final   Klebsiella pneumoniae NOT DETECTED NOT DETECTED Final   Proteus species NOT DETECTED NOT DETECTED Final   Salmonella species NOT DETECTED NOT DETECTED Final   Serratia marcescens NOT DETECTED NOT DETECTED Final   Haemophilus influenzae NOT DETECTED NOT DETECTED Final   Neisseria meningitidis NOT DETECTED NOT DETECTED Final   Pseudomonas aeruginosa NOT DETECTED NOT DETECTED Final   Stenotrophomonas maltophilia NOT DETECTED NOT DETECTED Final   Candida albicans NOT DETECTED NOT DETECTED Final   Candida auris NOT DETECTED NOT DETECTED Final   Candida glabrata NOT DETECTED NOT DETECTED Final   Candida krusei NOT DETECTED NOT DETECTED Final   Candida parapsilosis NOT DETECTED NOT DETECTED Final   Candida tropicalis NOT DETECTED NOT DETECTED Final   Cryptococcus neoformans/gattii NOT DETECTED NOT DETECTED Final   Vancomycin  resistance NOT DETECTED NOT DETECTED Final    Comment: Performed at [facility], [address], [place], [place] [address]  Blood culture (routine x 2)     Status: Abnormal   Collection Time: 03/11/24  7:09 PM   Specimen: BLOOD  Result Value Ref Range Status   Specimen Description BLOOD RIGHT ANTECUBITAL  Final   Special Requests   Final    BOTTLES DRAWN AEROBIC AND ANAEROBIC Blood Culture results may not be optimal due to an inadequate volume of blood  received in culture bottles   Culture  Setup Time   Final    GRAM POSITIVE COCCI IN CHAINS IN PAIRS AEROBIC BOTTLE ONLY CRITICAL VALUE NOTED.  VALUE IS CONSISTENT WITH PREVIOUSLY REPORTED AND CALLED VALUE.    Culture (A)  Final    ENTEROCOCCUS FAECALIS SUSCEPTIBILITIES PERFORMED ON PREVIOUS CULTURE WITHIN THE LAST 5 DAYS. Performed at [facility], [address], [place], [place] [address]    Report Status 03/15/2024 FINAL  Final  Urine Culture     Status: Abnormal   Collection Time: 03/12/24  4:45 AM   Specimen: Urine, Clean Catch  Result Value Ref Range Status   Specimen Description URINE, CLEAN CATCH  Final   Special Requests NONE  Final   Culture (A)  Final    <10,000 COLONIES/mL INSIGNIFICANT GROWTH Performed at [facility], [address], [place], [place] [address]    Report Status 03/13/2024 FINAL  Final  Culture, blood (Routine X 2) w Reflex to ID Panel     Status: None   Collection Time: 03/13/24  5:07 AM   Specimen: BLOOD  Result Value Ref Range Status   Specimen Description BLOOD BLOOD LEFT ARM  Final   Special Requests   Final    BOTTLES DRAWN AEROBIC AND ANAEROBIC Blood Culture adequate volume   Culture   Final    NO GROWTH 6 DAYS Performed at [facility], [address] [place], [place] [address]    Report Status 03/19/2024 FINAL  Final  C Difficile Quick Screen w PCR reflex     Status: None   Collection Time: 03/13/24 10:48 AM   Specimen: Stool  Result Value Ref Range Status   C Diff antigen NEGATIVE NEGATIVE Final   C Diff toxin NEGATIVE NEGATIVE Final   C Diff interpretation No C. difficile detected.  Final    Comment: Performed at [facility], [address] [place], [place] [address]  Gastrointestinal Panel by PCR , Stool     Status: None   Collection Time: 03/13/24  2:30 PM   Specimen: Stool  Result Value Ref Range Status   Campylobacter species NOT DETECTED NOT DETECTED Final   Plesimonas shigelloides NOT DETECTED NOT  DETECTED Final   Salmonella species NOT DETECTED NOT DETECTED Final   Yersinia enterocolitica NOT DETECTED NOT DETECTED Final   Vibrio species NOT DETECTED NOT DETECTED Final   Vibrio cholerae NOT DETECTED NOT DETECTED Final   Enteroaggregative E coli (EAEC) NOT DETECTED NOT DETECTED Final   Enteropathogenic E coli (EPEC) NOT DETECTED NOT DETECTED Final   Enterotoxigenic E coli (ETEC) NOT DETECTED NOT DETECTED Final   Shiga like toxin producing E coli (STEC) NOT DETECTED NOT DETECTED Final   Shigella/Enteroinvasive E coli (EIEC) NOT DETECTED NOT DETECTED Final   Cryptosporidium NOT DETECTED NOT DETECTED Final   Cyclospora cayetanensis NOT DETECTED NOT DETECTED Final   Entamoeba histolytica NOT DETECTED NOT DETECTED Final   Giardia lamblia NOT DETECTED NOT DETECTED Final   Adenovirus F40/41 NOT DETECTED NOT DETECTED Final   Astrovirus NOT DETECTED NOT DETECTED Final   Norovirus GI/GII NOT DETECTED NOT DETECTED Final   Rotavirus A NOT DETECTED NOT DETECTED Final   Sapovirus (I, II, IV, and V) NOT DETECTED NOT DETECTED Final    Comment: Performed at [facility], [address], [place], [place] [address]    Radiology Studies: No results found.  Scheduled Meds:  artificial tears  1 drop Both Eyes q morning   Chlorhexidine  Gluconate Cloth  6 each Topical Daily   donepezil   10 mg Oral q morning   escitalopram   20 mg Oral q morning   feeding supplement  237 mL Oral BID BM   Gerhardt's butt cream   Topical BID   heparin  injection (subcutaneous)  5,000 Units Subcutaneous Q8H   memantine   5 mg Oral BID   mirabegron  ER  50 mg Oral q morning   nicotine   21 mg Transdermal Daily   Continuous Infusions:  ampicillin  (OMNIPEN) IV 300 mL/hr at 03/19/24 0900     LOS: 8 days    Time spent: 35 mins    Darcel Dawley, MD [facility]   If 7PM-7AM, please contact night-coverage

## 2024-03-19 NOTE — Care Management Important Message (Signed)
 Important Message  Patient Details  Name: [name] MRN: [id number] Date of Birth: 02/06/1941   Important Message Given:  Yes - Medicare IM     [name] Sharps 03/19/2024, 11:08 AM

## 2024-03-19 NOTE — Plan of Care (Signed)
  Problem: Education: Goal: Knowledge of General Education information will improve Description: Including pain rating scale, medication(s)/side effects and non-pharmacologic comfort measures Outcome: Progressing   Problem: Health Behavior/Discharge Planning: Goal: Ability to manage health-related needs will improve Outcome: Progressing   Problem: Clinical Measurements: Goal: Ability to maintain clinical measurements within normal limits will improve Outcome: Progressing Goal: Diagnostic test results will improve Outcome: Progressing   Problem: Activity: Goal: Risk for activity intolerance will decrease Outcome: Progressing   Problem: Nutrition: Goal: Adequate nutrition will be maintained Outcome: Progressing   Problem: Coping: Goal: Level of anxiety will decrease Outcome: Progressing   Problem: Elimination: Goal: Will not experience complications related to bowel motility Outcome: Progressing Goal: Will not experience complications related to urinary retention Outcome: Progressing   Problem: Pain Managment: Goal: General experience of comfort will improve and/or be controlled Outcome: Progressing   Problem: Safety: Goal: Ability to remain free from injury will improve Outcome: Progressing   Problem: Skin Integrity: Goal: Risk for impaired skin integrity will decrease Outcome: Progressing

## 2024-03-20 ENCOUNTER — Other Ambulatory Visit (HOSPITAL_COMMUNITY): Payer: Self-pay

## 2024-03-20 DIAGNOSIS — B952 Enterococcus as the cause of diseases classified elsewhere: Secondary | ICD-10-CM | POA: Diagnosis not present

## 2024-03-20 DIAGNOSIS — R7881 Bacteremia: Secondary | ICD-10-CM | POA: Diagnosis not present

## 2024-03-20 MED ORDER — DIPHENOXYLATE-ATROPINE 2.5-0.025 MG PO TABS
1.0000 | ORAL_TABLET | Freq: Four times a day (QID) | ORAL | 0 refills | Status: AC | PRN
  Filled 2024-03-20: qty 12, 3d supply, fill #0

## 2024-03-20 MED ORDER — AMOXICILLIN 875 MG PO TABS
875.0000 mg | ORAL_TABLET | Freq: Two times a day (BID) | ORAL | 0 refills | Status: AC
  Filled 2024-03-20: qty 18, 9d supply, fill #0

## 2024-03-20 NOTE — Discharge Instructions (Signed)
 Advised to follow-up with primary care physician in 1 week. Advised to take amoxicillin  875 mg twice daily for 9 more days to complete 2 weeks treatment for ESBL bacteremia. Advised to follow-up with urology as scheduled. Advised to take Lomotil  as needed.

## 2024-03-20 NOTE — Discharge Summary (Signed)
 Physician Discharge Summary  [name] FMW:[id number] DOB: 1941-01-20 DOA: 03/11/2024  PCP: Patti Mliss LABOR, FNP  Admit date: 03/11/2024  Discharge date: 03/20/2024  Admitted From:Home  Disposition:  Home  Recommendations for Outpatient Follow-up:  Follow up with PCP in 1-2 weeks. Please obtain BMP/CBC in one week. Advised to take amoxicillin  875 mg twice daily for 9 more days to complete 2 weeks treatment for ESBL bacteremia. Advised to follow-up with urology as scheduled. Advised to take Lomotil  as needed.  Home Health:None Equipment/Devices:None  Discharge Condition: Stable CODE STATUS:Full code Diet recommendation: Heart Healthy   Brief [facility] Course: This 83 y.o. female who presents from home with worsening mental status after recent bilateral ureteroscopy with laser lithotripsy, stone extraction with left ureteral stent exchange and right ureteral stent placement. Patient has known comorbid conditions including vascular dementia, prior TIA, chronic anemia of chronic disease, COPD without oxygen. In the ED patient noted to be febrile, tachycardic,  hypotensive , meeting sepsis criteria given presumed infection of UTI with recent instrumentation. Foley placed at intake per urology request, antibiotics initiated with broad-spectrum coverage.  She was admitted for further evaluation.  Blood cultures positive for Enterococcus bacteremia.  Infectious disease consulted, recommended TTE, which was negative for vegetation. TEE not needed as patient remained asymptomatic.  Infectious disease recommended patient can be discharged on amoxicillin  for remaining days to complete 2 weeks treatment.  Patient continues to have loose watery stool which improved with Lomotil .  Ureteral Stents were dislodged and removed. Patient will follow-up with urology outpatient.  Patient is being discharged home.   Discharge Diagnoses:  Principal Problem:   Bacteremia due to Enterococcus Active  Problems:   COPD (chronic obstructive pulmonary disease) (HCC)   Memory loss   UTI (urinary tract infection)   Sepsis (HCC)   Acute encephalopathy   ARF (acute renal failure) (HCC)   Anemia  Sepsis secondary to UTI, POA: Enterococcus bacteremia: - Recent bilateral ureteral stent placements on 8/20 with laser lithotripsy by Dr. Elisabeth. - Disseminated infection likely present prior to procedure secondary to infected nephrolithiasis. - Narrowed down antibiotics to ampicillin  per infectious disease. - Blood culture positive for Enterococcus. - TTE - 60/65% without notable vegetation - will likely need TEE in the next few days to evaluate for valvular lesions. - ID recommended no plans for TEE at this time unless repeat blood cultures positive.  - Antibiotics can be changed to oral amoxicillin  and finish 2 weeks course total duration. - Patient still continued to have loose stools. She spiked fever 102.7 yesterday. - Diarrhea improved.  Patient remained fever free for more than 24 hours. -Patient being discharged on amoxicillin  for remaining days. Sepsis resolved.   Acute metabolic encephalopathy secondary to sepsis > resolved. She appears back to her baseline mental status.   Acute kidney injury: > Resolved. - Complicated by recent hydronephrosis /obstructing nephrolithiasis, recent stent placements and urological procedure.  - Foley removed, stents will remain in place for now - Creatinine continues to downtrend appropriately - Continue IV fluids, advance diet as tolerated, follow repeat labs.   Hypocalcemia: - Acute on chronic, likely at least partially hemodilutional  -Continue to follow, no signs or symptoms of clinical hypocalcemia.   History of dementia: Continue donepezil , memantine .   Chronic normocytic anemia, likely anemia chronic disease given above  - No acute signs or symptoms of bleeding, follow repeat labs - Iron panel consistent with mixed chronic disease anemia as  well as for iron deficiency given normal ferritin.  Prior history of TIA : Continue aspirin .   COPD noted in the chart presently not wheezing.   Hypokalemia: Replaced.  Continue to monitor.  Discharge Instructions  Discharge Instructions     Call MD for:  difficulty breathing, headache or visual disturbances   Complete by: As directed    Call MD for:  persistant dizziness or light-headedness   Complete by: As directed    Call MD for:  persistant nausea and vomiting   Complete by: As directed    Diet - low sodium heart healthy   Complete by: As directed    Diet general   Complete by: As directed    Discharge instructions   Complete by: As directed    Advised to follow-up with primary care physician in 1 week. Advised to take amoxicillin  875 mg twice daily for 9 more days to complete 2 weeks treatment for ESBL bacteremia. Advised to follow-up with urology as scheduled. Advised to take Lomotil  as needed.   Increase activity slowly   Complete by: As directed    No wound care   Complete by: As directed       Allergies as of 03/20/2024       Reactions   Sulfa Antibiotics Itching, Rash        Medication List     STOP taking these medications    loperamide 2 MG tablet Commonly known as: IMODIUM A-D       TAKE these medications    amoxicillin  875 MG tablet Commonly known as: AMOXIL  Take 1 tablet (875 mg total) by mouth 2 (two) times daily for 9 days.   aspirin  EC 81 MG tablet Take 81 mg by mouth in the morning.   bismuth subsalicylate 262 MG/15ML suspension Commonly known as: PEPTO BISMOL Take 30 mLs by mouth every 6 (six) hours as needed for indigestion or diarrhea or loose stools.   Blink Tears 0.25 % Soln Generic drug: Polyethylene Glycol 400 Place 1 drop into both eyes in the morning.   Calcium-Vitamin D-Vitamin K 650-12.5-40 MG-MCG-MCG Chew Chew 1 tablet by mouth in the morning.   cyanocobalamin  1000 MCG tablet Commonly known as: VITAMIN  B12 Take 1,000 mcg by mouth in the morning.   diphenoxylate -atropine  2.5-0.025 MG tablet Commonly known as: LOMOTIL  Take 1 tablet by mouth 4 (four) times daily as needed for up to 3 days for diarrhea or loose stools.   donepezil  10 MG tablet Commonly known as: ARICEPT  Take 10 mg by mouth in the morning.   Ibuprofen  200 MG Caps Take 200 mg by mouth every 8 (eight) hours as needed (pain/headaches.).   Lexapro  20 MG tablet Generic drug: escitalopram  Take 20 mg by mouth in the morning.   memantine  5 MG tablet Commonly known as: NAMENDA  Take 5 mg by mouth in the morning and at bedtime.   mirabegron  ER 50 MG Tb24 tablet Commonly known as: MYRBETRIQ  Take 50 mg by mouth in the morning.   pantoprazole  40 MG tablet Commonly known as: Protonix  Take 1 tablet (40 mg total) by mouth daily. What changed: when to take this   PreserVision AREDS 2 Caps Take 1 capsule by mouth in the morning and at bedtime.   ONE A DAY WOMEN 50 PLUS PO Take 1 tablet by mouth in the morning.   PROBIOTIC PO Take 1 capsule by mouth in the morning.        Follow-up Information     Patti Mliss LABOR, FNP Follow up in 1 week(s).  Specialty: Family Medicine Contact information: [address] [place] [address] [phone]         [facility] Follow up in 1 week(s).   Contact information: [address] [place]   [address] [phone]               Allergies  Allergen Reactions   Sulfa Antibiotics Itching and Rash    Consultations: Urology Infectious disease  Procedures/Studies: ECHOCARDIOGRAM COMPLETE Result Date: 03/13/2024    ECHOCARDIOGRAM REPORT   Patient Name:   [name] Date of Exam: 03/13/2024 Medical Rec #:  [id number]       Height:       58.0 in Accession #:    [id number]      Weight:       100.5 lb Date of Birth:  [birth year]       BSA:          1.359 m Patient Age:    83 years        BP:           124/77 mmHg  Patient Gender: F               HR:           85 bpm. Exam Location:  Inpatient Procedure: 2D Echo, Cardiac Doppler and Color Doppler (Both Spectral and Color            Flow Doppler were utilized during procedure). Indications:    Bacteremia  History:        Patient has prior history of Echocardiogram examinations, most                 recent 12/26/2023. COPD and Stroke; Risk Factors:Current Smoker                 and CKD.  Sonographer:    Logan Shove RDCS Referring Phys: CORDELLA JONETTA JULY IMPRESSIONS  1. Left ventricular ejection fraction, by estimation, is 60 to 65%. Left ventricular ejection fraction by 2D MOD biplane is 64.0 %. The left ventricle has normal function. The left ventricle has no regional wall motion abnormalities. Left ventricular diastolic parameters are consistent with Grade I diastolic dysfunction (impaired relaxation).  2. Right ventricular systolic function is normal. The right ventricular size is normal. There is normal pulmonary artery systolic pressure. The estimated right ventricular systolic pressure is 24.9 mmHg.  3. Left atrial size was moderately dilated.  4. Right atrial size was moderately dilated.  5. The mitral valve is abnormal. Mild mitral valve regurgitation.  6. The aortic valve is tricuspid. Aortic valve regurgitation is not visualized.  7. The inferior vena cava is normal in size with greater than 50% respiratory variability, suggesting right atrial pressure of 3 mmHg. Comparison(s): Changes from prior study are noted. 12/26/2023: LVEF 65-70%, mild LAE, moderate RAE, trivial MR, moderate TR. Conclusion(s)/Recommendation(s): No evidence of valvular vegetations on this transthoracic echocardiogram. Consider a transesophageal echocardiogram to exclude infective endocarditis if clinically indicated. FINDINGS  Left Ventricle: Left ventricular ejection fraction, by estimation, is 60 to 65%. Left ventricular ejection fraction by 2D MOD biplane is 64.0 %. The left ventricle has normal  function. The left ventricle has no regional wall motion abnormalities. The left ventricular internal cavity size was normal in size. There is no left ventricular hypertrophy. Left ventricular diastolic parameters are consistent with Grade I diastolic dysfunction (impaired relaxation). Indeterminate filling pressures. Right Ventricle: The right ventricular size is normal. No increase  in right ventricular wall thickness. Right ventricular systolic function is normal. There is normal pulmonary artery systolic pressure. The tricuspid regurgitant velocity is 2.34 m/s, and  with an assumed right atrial pressure of 3 mmHg, the estimated right ventricular systolic pressure is 24.9 mmHg. Left Atrium: Left atrial size was moderately dilated. Right Atrium: Right atrial size was moderately dilated. Pericardium: There is no evidence of pericardial effusion. Mitral Valve: The mitral valve is abnormal. There is mild thickening of the anterior and posterior mitral valve leaflet(s). Mild mitral valve regurgitation, with posteriorly-directed jet. Tricuspid Valve: The tricuspid valve is grossly normal. Tricuspid valve regurgitation is trivial. Aortic Valve: The aortic valve is tricuspid. Aortic valve regurgitation is not visualized. Aortic valve mean gradient measures 4.0 mmHg. Aortic valve peak gradient measures 7.5 mmHg. Aortic valve area, by VTI measures 2.64 cm. Pulmonic Valve: The pulmonic valve was grossly normal. Pulmonic valve regurgitation is trivial. Aorta: The aortic root and ascending aorta are structurally normal, with no evidence of dilitation. Venous: The inferior vena cava is normal in size with greater than 50% respiratory variability, suggesting right atrial pressure of 3 mmHg. IAS/Shunts: No atrial level shunt detected by color flow Doppler.  LEFT VENTRICLE PLAX 2D                        Biplane EF (MOD) LVIDd:         4.70 cm         LV Biplane EF:   Left LVIDs:         2.90 cm                           ventricular LV PW:         0.70 cm                          ejection LV IVS:        0.80 cm                          fraction by LVOT diam:     2.00 cm                          2D MOD LV SV:         69                               biplane is LV SV Index:   51                               64.0 %. LVOT Area:     3.14 cm                                Diastology                                LV e' medial:    11.00 cm/s LV Volumes (MOD)               LV E/e' medial:  9.7 LV vol d, MOD    69.2 ml  LV e' lateral:   11.10 cm/s A2C:                           LV E/e' lateral: 9.6 LV vol d, MOD    75.6 ml A4C: LV vol s, MOD    25.2 ml A2C: LV vol s, MOD    27.0 ml A4C: LV SV MOD A2C:   44.0 ml LV SV MOD A4C:   75.6 ml LV SV MOD BP:    47.0 ml RIGHT VENTRICLE             IVC RV Basal diam:  3.30 cm     IVC diam: 1.60 cm RV S prime:     17.50 cm/s TAPSE (M-mode): 3.2 cm LEFT ATRIUM             Index        RIGHT ATRIUM           Index LA diam:        2.50 cm 1.84 cm/m   RA Area:     18.20 cm LA Vol (A2C):   77.4 ml 56.93 ml/m  RA Volume:   54.70 ml  40.24 ml/m LA Vol (A4C):   50.1 ml 36.85 ml/m LA Biplane Vol: 67.8 ml 49.87 ml/m  AORTIC VALVE AV Area (Vmax):    2.36 cm AV Area (Vmean):   2.45 cm AV Area (VTI):     2.64 cm AV Vmax:           137.00 cm/s AV Vmean:          90.900 cm/s AV VTI:            0.261 m AV Peak Grad:      7.5 mmHg AV Mean Grad:      4.0 mmHg LVOT Vmax:         103.00 cm/s LVOT Vmean:        71.000 cm/s LVOT VTI:          0.219 m LVOT/AV VTI ratio: 0.84  AORTA Ao Root diam: 2.60 cm Ao Asc diam:  2.90 cm MITRAL VALVE                TRICUSPID VALVE MV Area (PHT): 3.53 cm     TR Peak grad:   21.9 mmHg MV Decel Time: 215 msec     TR Vmax:        234.00 cm/s MV E velocity: 107.00 cm/s MV A velocity: 103.00 cm/s  SHUNTS MV E/A ratio:  1.04         Systemic VTI:  0.22 m                             Systemic Diam: 2.00 cm Vinie Maxcy MD Electronically signed by Vinie Maxcy MD Signature  Date/Time: 03/13/2024/3:52:35 PM    Final    DG Chest Portable 1 View Result Date: 03/11/2024 CLINICAL DATA:  Fever. EXAM: PORTABLE CHEST 1 VIEW COMPARISON:  Chest radiograph dated 01/16/2024. FINDINGS: No focal consolidation, pleural effusion or pneumothorax. The cardiac silhouette is within normal limits. Atherosclerotic calcification of the aorta. No acute osseous pathology. IMPRESSION: No active disease. Electronically Signed   By: Vanetta Chou M.D.   On: 03/11/2024 18:06   DG C-Arm 1-60 Min-No Report Result Date: 03/11/2024 Fluoroscopy was utilized by the requesting physician.  No radiographic interpretation.   DG C-Arm 1-60 Min-No  Report Result Date: 03/11/2024 Fluoroscopy was utilized by the requesting physician.  No radiographic interpretation.   Subjective: Patient was seen and examined at bedside.  Overnight events noted. Patient reports feeling much improved.  Reports diarrhea is improving and she wants to be discharged.  Discharge Exam: Vitals:   03/20/24 0442 03/20/24 0815  BP: (!) 127/55 (!) 128/51  Pulse: 82 80  Resp: 18 (!) 21  Temp: 98.4 F (36.9 C) 98.8 F (37.1 C)  SpO2: 96% 97%   Vitals:   03/19/24 2002 03/19/24 2314 03/20/24 0442 03/20/24 0815  BP: (!) 125/52 (!) 124/50 (!) 127/55 (!) 128/51  Pulse:  91 82 80  Resp: 18 20 18  (!) 21  Temp: 99.1 F (37.3 C) 98.8 F (37.1 C) 98.4 F (36.9 C) 98.8 F (37.1 C)  TempSrc: Oral Oral Oral Oral  SpO2: (!) 6% 97% 96% 97%  Weight:      Height:        General: Pt is alert, awake, not in acute distress Cardiovascular: RRR, S1/S2 +, no rubs, no gallops Respiratory: CTA bilaterally, no wheezing, no rhonchi Abdominal: Soft, NT, ND, bowel sounds + Extremities: no edema, no cyanosis    The results of significant diagnostics from this hospitalization (including imaging, microbiology, ancillary and laboratory) are listed below for reference.     Microbiology: Recent Results (from the past 240 hours)  Blood  culture (routine x 2)     Status: Abnormal   Collection Time: 03/11/24  7:05 PM   Specimen: BLOOD  Result Value Ref Range Status   Specimen Description BLOOD LEFT ANTECUBITAL  Final   Special Requests   Final    BOTTLES DRAWN AEROBIC AND ANAEROBIC Blood Culture adequate volume   Culture  Setup Time   Final    GRAM POSITIVE COCCI IN CHAINS ANAEROBIC BOTTLE ONLY CRITICAL RESULT CALLED TO, READ BACK BY AND VERIFIED WITH: PHARMD KARLEN BROVEY ON 03/12/24 @ 1351 BY DRT Performed at [facility], [address], [place], [place] [address]    Culture ENTEROCOCCUS FAECALIS (A)  Final   Report Status 03/14/2024 FINAL  Final   Organism ID, Bacteria ENTEROCOCCUS FAECALIS  Final      Susceptibility   Enterococcus faecalis - MIC*    AMPICILLIN  <=2 SENSITIVE Sensitive     VANCOMYCIN  1 SENSITIVE Sensitive     GENTAMICIN SYNERGY SENSITIVE Sensitive     * ENTEROCOCCUS FAECALIS  Blood Culture ID Panel (Reflexed)     Status: Abnormal   Collection Time: 03/11/24  7:05 PM  Result Value Ref Range Status   Enterococcus faecalis DETECTED (A) NOT DETECTED Final    Comment: CRITICAL RESULT CALLED TO, READ BACK BY AND VERIFIED WITH: PHARMD KARLEN BROVEY ON 03/12/24 @ 1351 BY DRT    Enterococcus Faecium NOT DETECTED NOT DETECTED Final   Listeria monocytogenes NOT DETECTED NOT DETECTED Final   Staphylococcus species NOT DETECTED NOT DETECTED Final   Staphylococcus aureus (BCID) NOT DETECTED NOT DETECTED Final   Staphylococcus epidermidis NOT DETECTED NOT DETECTED Final   Staphylococcus lugdunensis NOT DETECTED NOT DETECTED Final   Streptococcus species NOT DETECTED NOT DETECTED Final   Streptococcus agalactiae NOT DETECTED NOT DETECTED Final   Streptococcus pneumoniae NOT DETECTED NOT DETECTED Final   Streptococcus pyogenes NOT DETECTED NOT DETECTED Final   A.calcoaceticus-baumannii NOT DETECTED NOT DETECTED Final   Bacteroides fragilis NOT DETECTED NOT DETECTED Final   Enterobacterales NOT DETECTED  NOT DETECTED Final   Enterobacter cloacae complex NOT DETECTED NOT DETECTED  Final   Escherichia coli NOT DETECTED NOT DETECTED Final   Klebsiella aerogenes NOT DETECTED NOT DETECTED Final   Klebsiella oxytoca NOT DETECTED NOT DETECTED Final   Klebsiella pneumoniae NOT DETECTED NOT DETECTED Final   Proteus species NOT DETECTED NOT DETECTED Final   Salmonella species NOT DETECTED NOT DETECTED Final   Serratia marcescens NOT DETECTED NOT DETECTED Final   Haemophilus influenzae NOT DETECTED NOT DETECTED Final   Neisseria meningitidis NOT DETECTED NOT DETECTED Final   Pseudomonas aeruginosa NOT DETECTED NOT DETECTED Final   Stenotrophomonas maltophilia NOT DETECTED NOT DETECTED Final   Candida albicans NOT DETECTED NOT DETECTED Final   Candida auris NOT DETECTED NOT DETECTED Final   Candida glabrata NOT DETECTED NOT DETECTED Final   Candida krusei NOT DETECTED NOT DETECTED Final   Candida parapsilosis NOT DETECTED NOT DETECTED Final   Candida tropicalis NOT DETECTED NOT DETECTED Final   Cryptococcus neoformans/gattii NOT DETECTED NOT DETECTED Final   Vancomycin  resistance NOT DETECTED NOT DETECTED Final    Comment: Performed at [facility], [address] [place], [place] [address]  Blood culture (routine x 2)     Status: Abnormal   Collection Time: 03/11/24  7:09 PM   Specimen: BLOOD  Result Value Ref Range Status   Specimen Description BLOOD RIGHT ANTECUBITAL  Final   Special Requests   Final    BOTTLES DRAWN AEROBIC AND ANAEROBIC Blood Culture results may not be optimal due to an inadequate volume of blood received in culture bottles   Culture  Setup Time   Final    GRAM POSITIVE COCCI IN CHAINS IN PAIRS AEROBIC BOTTLE ONLY CRITICAL VALUE NOTED.  VALUE IS CONSISTENT WITH PREVIOUSLY REPORTED AND CALLED VALUE.    Culture (A)  Final    ENTEROCOCCUS FAECALIS SUSCEPTIBILITIES PERFORMED ON PREVIOUS CULTURE WITHIN THE LAST 5 DAYS. Performed at [facility], [address] [place], [place] [address]    Report Status 03/15/2024 FINAL  Final  Urine Culture     Status: Abnormal   Collection Time: 03/12/24  4:45 AM   Specimen: Urine, Clean Catch  Result Value Ref Range Status   Specimen Description URINE, CLEAN CATCH  Final   Special Requests NONE  Final   Culture (A)  Final    <10,000 COLONIES/mL INSIGNIFICANT GROWTH Performed at [facility], [address], [place], [place] [address]    Report Status 03/13/2024 FINAL  Final  Culture, blood (Routine X 2) w Reflex to ID Panel     Status: None   Collection Time: 03/13/24  5:07 AM   Specimen: BLOOD  Result Value Ref Range Status   Specimen Description BLOOD BLOOD LEFT ARM  Final   Special Requests   Final    BOTTLES DRAWN AEROBIC AND ANAEROBIC Blood Culture adequate volume   Culture   Final    NO GROWTH 6 DAYS Performed at [facility], [address], [place], [place] [address]    Report Status 03/19/2024 FINAL  Final  C Difficile Quick Screen w PCR reflex     Status: None   Collection Time: 03/13/24 10:48 AM   Specimen: Stool  Result Value Ref Range Status   C Diff antigen NEGATIVE NEGATIVE Final   C Diff toxin NEGATIVE NEGATIVE Final   C Diff interpretation No C. difficile detected.  Final    Comment: Performed at [facility], [address] [place], [place] [address]  Gastrointestinal Panel by PCR ,  Stool     Status: None   Collection Time: 03/13/24  2:30 PM   Specimen: Stool  Result Value Ref Range Status   Campylobacter species NOT DETECTED NOT DETECTED Final   Plesimonas shigelloides NOT DETECTED NOT DETECTED Final   Salmonella species NOT DETECTED NOT DETECTED Final   Yersinia enterocolitica NOT DETECTED NOT DETECTED Final   Vibrio species NOT DETECTED NOT DETECTED Final   Vibrio cholerae NOT DETECTED NOT DETECTED Final   Enteroaggregative E coli (EAEC) NOT DETECTED NOT DETECTED Final   Enteropathogenic E coli (EPEC) NOT DETECTED NOT DETECTED Final   Enterotoxigenic E  coli (ETEC) NOT DETECTED NOT DETECTED Final   Shiga like toxin producing E coli (STEC) NOT DETECTED NOT DETECTED Final   Shigella/Enteroinvasive E coli (EIEC) NOT DETECTED NOT DETECTED Final   Cryptosporidium NOT DETECTED NOT DETECTED Final   Cyclospora cayetanensis NOT DETECTED NOT DETECTED Final   Entamoeba histolytica NOT DETECTED NOT DETECTED Final   Giardia lamblia NOT DETECTED NOT DETECTED Final   Adenovirus F40/41 NOT DETECTED NOT DETECTED Final   Astrovirus NOT DETECTED NOT DETECTED Final   Norovirus GI/GII NOT DETECTED NOT DETECTED Final   Rotavirus A NOT DETECTED NOT DETECTED Final   Sapovirus (I, II, IV, and V) NOT DETECTED NOT DETECTED Final    Comment: Performed at [facility], [address] [place], [place] [address]     Labs: BNP (last 3 results) Recent Labs    01/14/24 2327  BNP 774.0*   Basic Metabolic Panel: Recent Labs  Lab 03/15/24 0533 03/16/24 0602 03/17/24 0551 03/19/24 0452  NA 138  --  138 139  K 3.2*  --  3.2* 3.5  CL 108  --  109 107  CO2 25  --  23 23  GLUCOSE 113*  --  107* 113*  BUN 18  --  21 20  CREATININE 0.86  --  0.81 0.81  CALCIUM 8.3*  --  8.5* 8.4*  MG  --  1.8  --   --   PHOS  --  3.0  --   --    Liver Function Tests: No results for input(s): AST, ALT, ALKPHOS, BILITOT, PROT, ALBUMIN in the last 168 hours. No results for input(s): LIPASE, AMYLASE in the last 168 hours. No results for input(s): AMMONIA in the last 168 hours. CBC: Recent Labs  Lab 03/15/24 0533 03/17/24 0551 03/19/24 0452  WBC 10.3 10.7* 14.6*  HGB 7.9* 8.1* 7.8*  HCT 23.6* 24.4* 23.5*  MCV 91.8 90.7 90.7  PLT 234 289 353   Cardiac Enzymes: No results for input(s): CKTOTAL, CKMB, CKMBINDEX, TROPONINI in the last 168 hours. BNP: Invalid input(s): POCBNP CBG: Recent Labs  Lab 03/13/24 2354 03/14/24 0822 03/14/24 1626 03/16/24 0348 03/16/24 1342  GLUCAP 140* 125* 112* 118* 146*   D-Dimer No results for  input(s): DDIMER in the last 72 hours. Hgb A1c No results for input(s): HGBA1C in the last 72 hours. Lipid Profile No results for input(s): CHOL, HDL, LDLCALC, TRIG, CHOLHDL, LDLDIRECT in the last 72 hours. Thyroid  function studies No results for input(s): TSH, T4TOTAL, T3FREE, THYROIDAB in the last 72 hours.  Invalid input(s): FREET3 Anemia work up No results for input(s): VITAMINB12, FOLATE, FERRITIN, TIBC, IRON, RETICCTPCT in the last 72 hours. Urinalysis    Component Value Date/Time   COLORURINE YELLOW 03/11/2024 2135   APPEARANCEUR HAZY (A) 03/11/2024 2135   LABSPEC 1.014 03/11/2024 2135   PHURINE 7.0 03/11/2024 2135   GLUCOSEU NEGATIVE 03/11/2024 2135  HGBUR LARGE (A) 03/11/2024 2135   BILIRUBINUR NEGATIVE 03/11/2024 2135   BILIRUBINUR negative 06/20/2023 1231   KETONESUR NEGATIVE 03/11/2024 2135   PROTEINUR 100 (A) 03/11/2024 2135   UROBILINOGEN 0.2 06/20/2023 1231   UROBILINOGEN 0.2 10/21/2010 2043   NITRITE NEGATIVE 03/11/2024 2135   LEUKOCYTESUR LARGE (A) 03/11/2024 2135   Sepsis Labs Recent Labs  Lab 03/15/24 0533 03/17/24 0551 03/19/24 0452  WBC 10.3 10.7* 14.6*   Microbiology Recent Results (from the past 240 hours)  Blood culture (routine x 2)     Status: Abnormal   Collection Time: 03/11/24  7:05 PM   Specimen: BLOOD  Result Value Ref Range Status   Specimen Description BLOOD LEFT ANTECUBITAL  Final   Special Requests   Final    BOTTLES DRAWN AEROBIC AND ANAEROBIC Blood Culture adequate volume   Culture  Setup Time   Final    GRAM POSITIVE COCCI IN CHAINS ANAEROBIC BOTTLE ONLY CRITICAL RESULT CALLED TO, READ BACK BY AND VERIFIED WITH: PHARMD KARLEN BROVEY ON 03/12/24 @ 1351 BY DRT Performed at [facility], [address] [place], [place] [address]    Culture ENTEROCOCCUS FAECALIS (A)  Final   Report Status 03/14/2024 FINAL  Final   Organism ID, Bacteria ENTEROCOCCUS FAECALIS  Final      Susceptibility    Enterococcus faecalis - MIC*    AMPICILLIN  <=2 SENSITIVE Sensitive     VANCOMYCIN  1 SENSITIVE Sensitive     GENTAMICIN SYNERGY SENSITIVE Sensitive     * ENTEROCOCCUS FAECALIS  Blood Culture ID Panel (Reflexed)     Status: Abnormal   Collection Time: 03/11/24  7:05 PM  Result Value Ref Range Status   Enterococcus faecalis DETECTED (A) NOT DETECTED Final    Comment: CRITICAL RESULT CALLED TO, READ BACK BY AND VERIFIED WITH: PHARMD KARLEN BROVEY ON 03/12/24 @ 1351 BY DRT    Enterococcus Faecium NOT DETECTED NOT DETECTED Final   Listeria monocytogenes NOT DETECTED NOT DETECTED Final   Staphylococcus species NOT DETECTED NOT DETECTED Final   Staphylococcus aureus (BCID) NOT DETECTED NOT DETECTED Final   Staphylococcus epidermidis NOT DETECTED NOT DETECTED Final   Staphylococcus lugdunensis NOT DETECTED NOT DETECTED Final   Streptococcus species NOT DETECTED NOT DETECTED Final   Streptococcus agalactiae NOT DETECTED NOT DETECTED Final   Streptococcus pneumoniae NOT DETECTED NOT DETECTED Final   Streptococcus pyogenes NOT DETECTED NOT DETECTED Final   A.calcoaceticus-baumannii NOT DETECTED NOT DETECTED Final   Bacteroides fragilis NOT DETECTED NOT DETECTED Final   Enterobacterales NOT DETECTED NOT DETECTED Final   Enterobacter cloacae complex NOT DETECTED NOT DETECTED Final   Escherichia coli NOT DETECTED NOT DETECTED Final   Klebsiella aerogenes NOT DETECTED NOT DETECTED Final   Klebsiella oxytoca NOT DETECTED NOT DETECTED Final   Klebsiella pneumoniae NOT DETECTED NOT DETECTED Final   Proteus species NOT DETECTED NOT DETECTED Final   Salmonella species NOT DETECTED NOT DETECTED Final   Serratia marcescens NOT DETECTED NOT DETECTED Final   Haemophilus influenzae NOT DETECTED NOT DETECTED Final   Neisseria meningitidis NOT DETECTED NOT DETECTED Final   Pseudomonas aeruginosa NOT DETECTED NOT DETECTED Final   Stenotrophomonas maltophilia NOT DETECTED NOT DETECTED Final   Candida  albicans NOT DETECTED NOT DETECTED Final   Candida auris NOT DETECTED NOT DETECTED Final   Candida glabrata NOT DETECTED NOT DETECTED Final   Candida krusei NOT DETECTED NOT DETECTED Final   Candida parapsilosis NOT DETECTED NOT DETECTED Final   Candida tropicalis NOT DETECTED NOT  DETECTED Final   Cryptococcus neoformans/gattii NOT DETECTED NOT DETECTED Final   Vancomycin  resistance NOT DETECTED NOT DETECTED Final    Comment: Performed at [facility], [address], [place], [place] [address]  Blood culture (routine x 2)     Status: Abnormal   Collection Time: 03/11/24  7:09 PM   Specimen: BLOOD  Result Value Ref Range Status   Specimen Description BLOOD RIGHT ANTECUBITAL  Final   Special Requests   Final    BOTTLES DRAWN AEROBIC AND ANAEROBIC Blood Culture results may not be optimal due to an inadequate volume of blood received in culture bottles   Culture  Setup Time   Final    GRAM POSITIVE COCCI IN CHAINS IN PAIRS AEROBIC BOTTLE ONLY CRITICAL VALUE NOTED.  VALUE IS CONSISTENT WITH PREVIOUSLY REPORTED AND CALLED VALUE.    Culture (A)  Final    ENTEROCOCCUS FAECALIS SUSCEPTIBILITIES PERFORMED ON PREVIOUS CULTURE WITHIN THE LAST 5 DAYS. Performed at [facility], [address], [place], [place] [address]    Report Status 03/15/2024 FINAL  Final  Urine Culture     Status: Abnormal   Collection Time: 03/12/24  4:45 AM   Specimen: Urine, Clean Catch  Result Value Ref Range Status   Specimen Description URINE, CLEAN CATCH  Final   Special Requests NONE  Final   Culture (A)  Final    <10,000 COLONIES/mL INSIGNIFICANT GROWTH Performed at [facility] [place], [address], [place], [place] [address]    Report Status 03/13/2024 FINAL  Final  Culture, blood (Routine X 2) w Reflex to ID Panel     Status: None   Collection Time: 03/13/24  5:07 AM   Specimen: BLOOD  Result Value Ref Range Status   Specimen Description BLOOD BLOOD LEFT ARM  Final   Special Requests    Final    BOTTLES DRAWN AEROBIC AND ANAEROBIC Blood Culture adequate volume   Culture   Final    NO GROWTH 6 DAYS Performed at [facility], [address], [place], [place] [address]    Report Status 03/19/2024 FINAL  Final  C Difficile Quick Screen w PCR reflex     Status: None   Collection Time: 03/13/24 10:48 AM   Specimen: Stool  Result Value Ref Range Status   C Diff antigen NEGATIVE NEGATIVE Final   C Diff toxin NEGATIVE NEGATIVE Final   C Diff interpretation No C. difficile detected.  Final    Comment: Performed at [facility], [address], [place], [place] [address]  Gastrointestinal Panel by PCR , Stool     Status: None   Collection Time: 03/13/24  2:30 PM   Specimen: Stool  Result Value Ref Range Status   Campylobacter species NOT DETECTED NOT DETECTED Final   Plesimonas shigelloides NOT DETECTED NOT DETECTED Final   Salmonella species NOT DETECTED NOT DETECTED Final   Yersinia enterocolitica NOT DETECTED NOT DETECTED Final   Vibrio species NOT DETECTED NOT DETECTED Final   Vibrio cholerae NOT DETECTED NOT DETECTED Final   Enteroaggregative E coli (EAEC) NOT DETECTED NOT DETECTED Final   Enteropathogenic E coli (EPEC) NOT DETECTED NOT DETECTED Final   Enterotoxigenic E coli (ETEC) NOT DETECTED NOT DETECTED Final   Shiga like toxin producing E coli (STEC) NOT DETECTED NOT DETECTED Final   Shigella/Enteroinvasive E coli (EIEC) NOT DETECTED NOT DETECTED Final   Cryptosporidium NOT DETECTED NOT DETECTED Final   Cyclospora cayetanensis NOT DETECTED  NOT DETECTED Final   Entamoeba histolytica NOT DETECTED NOT DETECTED Final   Giardia lamblia NOT DETECTED NOT DETECTED Final   Adenovirus F40/41 NOT DETECTED NOT DETECTED Final   Astrovirus NOT DETECTED NOT DETECTED Final   Norovirus GI/GII NOT DETECTED NOT DETECTED Final   Rotavirus A NOT DETECTED NOT DETECTED Final   Sapovirus (I, II, IV, and V) NOT DETECTED NOT DETECTED Final    Comment: Performed at [facility], [address] [place], [place] [address]     Time coordinating discharge: Over 30 minutes  SIGNED:   Darcel Dawley, MD  [facility] 03/20/2024, 1:38 PM Pager   If 7PM-7AM, please contact night-coverage

## 2024-08-10 ENCOUNTER — Inpatient Hospital Stay (HOSPITAL_COMMUNITY)
Admission: EM | Admit: 2024-08-10 | Discharge: 2024-08-26 | DRG: 871 | Disposition: A | Discharge status: Skilled Nursing Facility | Attending: Internal Medicine | Admitting: Internal Medicine

## 2024-08-10 ENCOUNTER — Emergency Department (HOSPITAL_COMMUNITY)

## 2024-08-10 DIAGNOSIS — K219 Gastro-esophageal reflux disease without esophagitis: Secondary | ICD-10-CM | POA: Diagnosis not present

## 2024-08-10 DIAGNOSIS — E440 Moderate protein-calorie malnutrition: Secondary | ICD-10-CM | POA: Insufficient documentation

## 2024-08-10 DIAGNOSIS — A419 Sepsis, unspecified organism: Principal | ICD-10-CM

## 2024-08-10 DIAGNOSIS — I629 Nontraumatic intracranial hemorrhage, unspecified: Secondary | ICD-10-CM

## 2024-08-10 DIAGNOSIS — N390 Urinary tract infection, site not specified: Secondary | ICD-10-CM

## 2024-08-10 DIAGNOSIS — F32A Depression, unspecified: Secondary | ICD-10-CM | POA: Diagnosis not present

## 2024-08-10 DIAGNOSIS — D649 Anemia, unspecified: Secondary | ICD-10-CM | POA: Diagnosis not present

## 2024-08-10 DIAGNOSIS — S0083XA Contusion of other part of head, initial encounter: Secondary | ICD-10-CM

## 2024-08-10 DIAGNOSIS — R197 Diarrhea, unspecified: Secondary | ICD-10-CM

## 2024-08-10 DIAGNOSIS — R4182 Altered mental status, unspecified: Secondary | ICD-10-CM

## 2024-08-10 DIAGNOSIS — E872 Acidosis, unspecified: Secondary | ICD-10-CM | POA: Diagnosis not present

## 2024-08-10 DIAGNOSIS — F419 Anxiety disorder, unspecified: Secondary | ICD-10-CM

## 2024-08-10 DIAGNOSIS — S06350A Traumatic hemorrhage of left cerebrum without loss of consciousness, initial encounter: Secondary | ICD-10-CM

## 2024-08-10 DIAGNOSIS — F0150 Vascular dementia without behavioral disturbance: Secondary | ICD-10-CM | POA: Diagnosis not present

## 2024-08-10 DIAGNOSIS — W19XXXA Unspecified fall, initial encounter: Secondary | ICD-10-CM | POA: Diagnosis not present

## 2024-08-10 DIAGNOSIS — S0635AA Traumatic hemorrhage of left cerebrum with loss of consciousness status unknown, initial encounter: Secondary | ICD-10-CM

## 2024-08-10 DIAGNOSIS — I619 Nontraumatic intracerebral hemorrhage, unspecified: Secondary | ICD-10-CM | POA: Diagnosis present

## 2024-08-10 LAB — CBC WITH DIFFERENTIAL/PLATELET
Abs Immature Granulocytes: 0.09 K/uL — ABNORMAL HIGH (ref 0.00–0.07)
Basophils Absolute: 0.1 K/uL (ref 0.0–0.1)
Basophils Relative: 0 %
Eosinophils Absolute: 0 K/uL (ref 0.0–0.5)
Eosinophils Relative: 0 %
HCT: 30.6 % — ABNORMAL LOW (ref 36.0–46.0)
Hemoglobin: 10.2 g/dL — ABNORMAL LOW (ref 12.0–15.0)
Immature Granulocytes: 1 %
Lymphocytes Relative: 7 %
Lymphs Abs: 1 K/uL (ref 0.7–4.0)
MCH: 31.9 pg (ref 26.0–34.0)
MCHC: 33.3 g/dL (ref 30.0–36.0)
MCV: 95.6 fL (ref 80.0–100.0)
Monocytes Absolute: 0.6 K/uL (ref 0.1–1.0)
Monocytes Relative: 4 %
Neutro Abs: 13.6 K/uL — ABNORMAL HIGH (ref 1.7–7.7)
Neutrophils Relative %: 88 %
Platelets: 268 K/uL (ref 150–400)
RBC: 3.2 MIL/uL — ABNORMAL LOW (ref 3.87–5.11)
RDW: 13.2 % (ref 11.5–15.5)
WBC: 15.4 K/uL — ABNORMAL HIGH (ref 4.0–10.5)
nRBC: 0 % (ref 0.0–0.2)

## 2024-08-10 LAB — I-STAT CHEM 8, ED
BUN: 19 mg/dL (ref 8–23)
Calcium, Ion: 1.21 mmol/L (ref 1.15–1.40)
Chloride: 100 mmol/L (ref 98–111)
Creatinine, Ser: 1.1 mg/dL — ABNORMAL HIGH (ref 0.44–1.00)
Glucose, Bld: 128 mg/dL — ABNORMAL HIGH (ref 70–99)
HCT: 29 % — ABNORMAL LOW (ref 36.0–46.0)
Hemoglobin: 9.9 g/dL — ABNORMAL LOW (ref 12.0–15.0)
Potassium: 3.8 mmol/L (ref 3.5–5.1)
Sodium: 139 mmol/L (ref 135–145)
TCO2: 26 mmol/L (ref 22–32)

## 2024-08-10 LAB — RESP PANEL BY RT-PCR (RSV, FLU A&B, COVID)  RVPGX2
Influenza A by PCR: NEGATIVE
Influenza B by PCR: NEGATIVE
Resp Syncytial Virus by PCR: NEGATIVE
SARS Coronavirus 2 by RT PCR: NEGATIVE

## 2024-08-10 LAB — I-STAT CG4 LACTIC ACID, ED: Lactic Acid, Venous: 2 mmol/L (ref 0.5–1.9)

## 2024-08-10 LAB — BASIC METABOLIC PANEL WITH GFR
Anion gap: 12 (ref 5–15)
BUN: 19 mg/dL (ref 8–23)
CO2: 28 mmol/L (ref 22–32)
Calcium: 9.8 mg/dL (ref 8.9–10.3)
Chloride: 99 mmol/L (ref 98–111)
Creatinine, Ser: 0.98 mg/dL (ref 0.44–1.00)
GFR, Estimated: 57 mL/min — ABNORMAL LOW
Glucose, Bld: 130 mg/dL — ABNORMAL HIGH (ref 70–99)
Potassium: 4 mmol/L (ref 3.5–5.1)
Sodium: 139 mmol/L (ref 135–145)

## 2024-08-10 MED ORDER — LACTATED RINGERS IV BOLUS
1000.0000 mL | Freq: Once | INTRAVENOUS | Status: AC
  Administered 2024-08-10: 1000 mL via INTRAVENOUS

## 2024-08-10 MED ORDER — VANCOMYCIN HCL 750 MG/150ML IV SOLN: 750.0000 mg | INTRAVENOUS | Status: DC

## 2024-08-10 MED ORDER — ACETAMINOPHEN 650 MG RE SUPP
650.0000 mg | Freq: Once | RECTAL | Status: AC
  Administered 2024-08-10: 650 mg via RECTAL
  Filled 2024-08-10: qty 1

## 2024-08-10 MED ORDER — ACETAMINOPHEN 325 MG PO TABS: 650.0000 mg | ORAL_TABLET | Freq: Once | ORAL | Status: DC

## 2024-08-10 MED ORDER — INSULIN ASPART 100 UNIT/ML IJ SOLN
0.0000 [IU] | INTRAMUSCULAR | Status: DC
  Administered 2024-08-11 – 2024-08-12 (×3): 1 [IU] via SUBCUTANEOUS
  Administered 2024-08-12: 2 [IU] via SUBCUTANEOUS
  Administered 2024-08-12: 1 [IU] via SUBCUTANEOUS
  Administered 2024-08-13: 2 [IU] via SUBCUTANEOUS
  Administered 2024-08-13: 1 [IU] via SUBCUTANEOUS
  Administered 2024-08-13: 2 [IU] via SUBCUTANEOUS
  Administered 2024-08-13: 1 [IU] via SUBCUTANEOUS
  Administered 2024-08-13 – 2024-08-14 (×2): 2 [IU] via SUBCUTANEOUS
  Administered 2024-08-14 (×3): 1 [IU] via SUBCUTANEOUS
  Administered 2024-08-14 (×2): 2 [IU] via SUBCUTANEOUS
  Administered 2024-08-15 (×3): 1 [IU] via SUBCUTANEOUS
  Administered 2024-08-15: 2 [IU] via SUBCUTANEOUS
  Administered 2024-08-15: 1 [IU] via SUBCUTANEOUS
  Administered 2024-08-15: 2 [IU] via SUBCUTANEOUS
  Administered 2024-08-16 – 2024-08-17 (×9): 1 [IU] via SUBCUTANEOUS
  Administered 2024-08-17: 2 [IU] via SUBCUTANEOUS
  Administered 2024-08-18 – 2024-08-19 (×7): 1 [IU] via SUBCUTANEOUS
  Administered 2024-08-19: 2 [IU] via SUBCUTANEOUS
  Administered 2024-08-19 – 2024-08-22 (×15): 1 [IU] via SUBCUTANEOUS
  Administered 2024-08-22: 2 [IU] via SUBCUTANEOUS
  Administered 2024-08-23 (×2): 1 [IU] via SUBCUTANEOUS
  Administered 2024-08-23: 2 [IU] via SUBCUTANEOUS
  Administered 2024-08-24 (×2): 1 [IU] via SUBCUTANEOUS
  Administered 2024-08-24: 2 [IU] via SUBCUTANEOUS
  Administered 2024-08-24 (×2): 1 [IU] via SUBCUTANEOUS
  Filled 2024-08-10 (×7): qty 1
  Filled 2024-08-10: qty 2
  Filled 2024-08-10: qty 1
  Filled 2024-08-10: qty 2
  Filled 2024-08-10 (×2): qty 1
  Filled 2024-08-10: qty 2
  Filled 2024-08-10: qty 1
  Filled 2024-08-10: qty 2
  Filled 2024-08-10 (×21): qty 1
  Filled 2024-08-10: qty 3
  Filled 2024-08-10: qty 2
  Filled 2024-08-10: qty 3
  Filled 2024-08-10 (×2): qty 1
  Filled 2024-08-10: qty 2
  Filled 2024-08-10: qty 1
  Filled 2024-08-10 (×2): qty 2
  Filled 2024-08-10 (×2): qty 1
  Filled 2024-08-10 (×2): qty 2
  Filled 2024-08-10 (×2): qty 1
  Filled 2024-08-10: qty 2
  Filled 2024-08-10 (×5): qty 1
  Filled 2024-08-10: qty 2
  Filled 2024-08-10 (×4): qty 1

## 2024-08-10 MED ORDER — LACTATED RINGERS IV BOLUS (SEPSIS)
500.0000 mL | Freq: Once | INTRAVENOUS | Status: AC
  Administered 2024-08-11: 500 mL via INTRAVENOUS

## 2024-08-10 MED ORDER — POLYETHYLENE GLYCOL 3350 17 G PO PACK: 17.0000 g | PACK | Freq: Every day | ORAL | Status: DC | PRN

## 2024-08-10 MED ORDER — SODIUM CHLORIDE 0.9 % IV SOLN: INTRAVENOUS | Status: DC

## 2024-08-10 MED ORDER — SENNA 8.6 MG PO TABS: 1.0000 | ORAL_TABLET | Freq: Two times a day (BID) | ORAL | Status: DC | PRN

## 2024-08-10 MED ORDER — LEVETIRACETAM (KEPPRA) 500 MG/5 ML ADULT IV PUSH
500.0000 mg | Freq: Two times a day (BID) | INTRAVENOUS | Status: DC
  Administered 2024-08-11: 500 mg via INTRAVENOUS
  Filled 2024-08-10: qty 5

## 2024-08-10 MED ORDER — VANCOMYCIN HCL IN DEXTROSE 1-5 GM/200ML-% IV SOLN
1000.0000 mg | Freq: Once | INTRAVENOUS | Status: DC
  Administered 2024-08-10: 1000 mg via INTRAVENOUS
  Filled 2024-08-10: qty 200

## 2024-08-10 MED ORDER — SODIUM CHLORIDE 0.9 % IV SOLN: 1.0000 g | INTRAVENOUS | Status: DC

## 2024-08-10 MED ORDER — SODIUM CHLORIDE 0.9 % IV SOLN
2.0000 g | Freq: Once | INTRAVENOUS | Status: AC
  Administered 2024-08-10: 2 g via INTRAVENOUS
  Filled 2024-08-10: qty 12.5

## 2024-08-10 MED ORDER — CHLORHEXIDINE GLUCONATE CLOTH 2 % EX PADS
6.0000 | MEDICATED_PAD | Freq: Every day | CUTANEOUS | Status: DC
  Administered 2024-08-11 – 2024-08-14 (×6): 6 via TOPICAL

## 2024-08-10 MED ORDER — LACTATED RINGERS IV BOLUS (SEPSIS)
1000.0000 mL | Freq: Once | INTRAVENOUS | Status: AC
  Administered 2024-08-10: 1000 mL via INTRAVENOUS

## 2024-08-10 MED ORDER — ONDANSETRON HCL 4 MG/2ML IJ SOLN
INTRAMUSCULAR | Status: AC
  Administered 2024-08-10: 4 mg via INTRAVENOUS
  Filled 2024-08-10: qty 2

## 2024-08-10 MED ORDER — METRONIDAZOLE 500 MG/100ML IV SOLN
500.0000 mg | Freq: Once | INTRAVENOUS | Status: AC
  Administered 2024-08-10: 500 mg via INTRAVENOUS
  Filled 2024-08-10: qty 100

## 2024-08-10 MED ORDER — ONDANSETRON HCL 4 MG/2ML IJ SOLN: 4.0000 mg | Freq: Once | INTRAMUSCULAR | Status: AC

## 2024-08-10 MED ORDER — FAMOTIDINE IN NACL 20-0.9 MG/50ML-% IV SOLN
20.0000 mg | INTRAVENOUS | Status: DC
  Administered 2024-08-11: 20 mg via INTRAVENOUS
  Filled 2024-08-10: qty 50

## 2024-08-10 NOTE — ED Provider Notes (Signed)
 " [place] EMERGENCY DEPARTMENT AT [facility] Provider Note   CSN: [id number] Arrival date & time: 08/10/24  2039     Patient presents with: Nausea, Fall, and Emesis   [name] is a 84 y.o. female.   84 year old female presents for evaluation of nausea fall and emesis.  She is poor historian, does not answer questions appropriately.  History obtained from EMS.  They state that patient was at home and EMS was called for nausea and vomiting for the last 3 days.  Patient had a fall tonight in which she hit her head as well.  She has reportedly also had diarrhea.  She is on aspirin  but no other blood thinners.  Further history limited at this time.   Fall  Emesis      Prior to Admission medications  Medication Sig Start Date End Date Taking? Authorizing Provider  aspirin  EC 81 MG tablet Take 81 mg by mouth in the morning.    [provider]  bismuth subsalicylate (PEPTO BISMOL) 262 MG/15ML suspension Take 30 mLs by mouth every 6 (six) hours as needed for indigestion or diarrhea or loose stools.    [provider]  BLINK TEARS 0.25 % SOLN Place 1 drop into both eyes in the morning.    [provider]  Calcium-Vitamin D-Vitamin K 650-12.5-40 MG-MCG-MCG CHEW Chew 1 tablet by mouth in the morning.    [provider]  cyanocobalamin  (VITAMIN B12) 1000 MCG tablet Take 1,000 mcg by mouth in the morning.    [provider]  donepezil  (ARICEPT ) 10 MG tablet Take 10 mg by mouth in the morning. 04/07/23 04/06/24  [provider]  escitalopram  (LEXAPRO ) 20 MG tablet Take 20 mg by mouth in the morning.    [provider]  Ibuprofen  200 MG CAPS Take 200 mg by mouth every 8 (eight) hours as needed (pain/headaches.).    [provider]  memantine  (NAMENDA ) 5 MG tablet Take 5 mg by mouth in the morning and at bedtime.    [provider]  mirabegron  ER (MYRBETRIQ ) 50 MG TB24 tablet Take 50 mg by mouth in the  morning.    [provider]  Multiple Vitamins-Minerals (ONE A DAY WOMEN 50 PLUS PO) Take 1 tablet by mouth in the morning.    [provider]  Multiple Vitamins-Minerals (PRESERVISION AREDS 2) CAPS Take 1 capsule by mouth in the morning and at bedtime.    [provider]  pantoprazole  (PROTONIX ) 40 MG tablet Take 1 tablet (40 mg total) by mouth daily. Patient taking differently: Take 40 mg by mouth 2 (two) times daily. 01/16/24 03/11/24  [name], MD  Probiotic Product (PROBIOTIC PO) Take 1 capsule by mouth in the morning.    [provider]    Allergies: Sulfa antibiotics    Review of Systems  Reason unable to perform ROS: dementia, ams.  Gastrointestinal:  Positive for vomiting.    Updated Vital Signs BP 131/63   Pulse (!) 103   Temp (!) 101.5 F (38.6 C) (Rectal)   Resp (!) 21   Ht 4' 10 (1.473 m)   Wt 45.6 kg   SpO2 99%   BMI 21.01 kg/m   Physical Exam Vitals and nursing note reviewed.  Constitutional:      General: She is not in acute distress.    Appearance: She is well-developed. She is ill-appearing.  HENT:     Head: Normocephalic.     Comments: Ecchymosis  to right forehead Eyes:     Conjunctiva/sclera: Conjunctivae normal.  Cardiovascular:     Rate and Rhythm: Regular rhythm. Tachycardia present.     Heart sounds: No murmur heard. Pulmonary:     Effort: Pulmonary effort is normal. No respiratory distress.     Breath sounds: Normal breath sounds.  Abdominal:     Palpations: Abdomen is soft.     Tenderness: There is no abdominal tenderness.  Musculoskeletal:        General: No swelling.     Cervical back: Neck supple.  Skin:    General: Skin is warm and dry.     Capillary Refill: Capillary refill takes less than 2 seconds.  Neurological:     General: No focal deficit present.     Mental Status: She is alert.     (all labs ordered are listed, but only abnormal results are displayed) Labs Reviewed  CBC  WITH DIFFERENTIAL/PLATELET - Abnormal; Notable for the following components:      Result Value   WBC 15.4 (*)    RBC 3.20 (*)    Hemoglobin 10.2 (*)    HCT 30.6 (*)    Neutro Abs 13.6 (*)    Abs Immature Granulocytes 0.09 (*)    All other components within normal limits  BASIC METABOLIC PANEL WITH GFR - Abnormal; Notable for the following components:   Glucose, Bld 130 (*)    GFR, Estimated 57 (*)    All other components within normal limits  I-STAT CHEM 8, ED - Abnormal; Notable for the following components:   Creatinine, Ser 1.10 (*)    Glucose, Bld 128 (*)    Hemoglobin 9.9 (*)    HCT 29.0 (*)    All other components within normal limits  I-STAT CG4 LACTIC ACID, ED - Abnormal; Notable for the following components:   Lactic Acid, Venous 2.0 (*)    All other components within normal limits  CULTURE, BLOOD (ROUTINE X 2)  CULTURE, BLOOD (ROUTINE X 2)  RESP PANEL BY RT-PCR (RSV, FLU A&B, COVID)  [id number]  URINALYSIS, W/ REFLEX TO CULTURE (INFECTION SUSPECTED)  I-STAT CG4 LACTIC ACID, ED    EKG: EKG Interpretation Date/Time:  Tuesday August 10 2024 20:45:57 EST Ventricular Rate:  113 PR Interval:  142 QRS Duration:  85 QT Interval:  332 QTC Calculation: 456 R Axis:   60  Text Interpretation: Sinus tachycardia Atrial premature complex Probable left atrial enlargement Compared with prior EKG from 03/11/2024 Confirmed by Gennaro Bouchard ([id number]) on 08/10/2024 9:12:49 PM  Radiology: CT Head Wo Contrast Result Date: 08/10/2024 EXAM: CT HEAD WITHOUT CONTRAST 08/10/2024 10:57:21 PM TECHNIQUE: CT of the head was performed without the administration of intravenous contrast. Automated exposure control, iterative reconstruction, and/or weight based adjustment of the mA/kV was utilized to reduce the radiation dose to as low as reasonably achievable. COMPARISON: CT Head March 25, 25 CLINICAL HISTORY: Hit head, AMS FINDINGS: BRAIN AND VENTRICLES: Multiple large acute hemorrhages in the left  occipital and left parietal lobes. The dominant left occipital hemorrhage measures up to 6.5 x 3.3 x 4.4 cm (estimated volume of 47 mL) and the dominant left parietal hemorrhage measures up to 5.3 x 1.8 x 2.4 cm (est volume of 11.5 mL). Surrounding edema. Regional mass effect without significant midline shift. There is small volume of extra-axial extension of hemorrhage. No evidence of acute large vascular territory infarct although the acute hemorrhage limits assessment. Moderate patchy white matter hypodensities, compatible with chronic microvascular eschemic change. Possible  intraventricular extension of hemorrhage into the left occipital horn, which is effaced. Recommend attention on follow up. No hydrocephalus. No extra-axial collection. No mass effect or midline shift. ORBITS: No acute abnormality. SINUSES: No acute abnormality. SOFT TISSUES AND SKULL: No acute soft tissue abnormality. No skull fracture. Findings discussed with Dr. Gennaro via telephone at 11:26 PM. IMPRESSION: 1. Multiple large acute intraparenchymal hemorrhages centered in the left parietal and occipital lobes, as detailed above. 2. Small volume of extra-axial extension of hemorrhage. 3. Possible intraventricular extension of hemorrhage into the left occipital horn, which is effaced. Recommend attention on follow up. 4. No significant midline shift. Electronically signed by: Glendia Molt MD 08/10/2024 11:27 PM EST RP Workstation: [id number]   CT ABDOMEN PELVIS WO CONTRAST Result Date: 08/10/2024 CLINICAL DATA:  Abdomen pain nausea vomiting diarrhea EXAM: CT ABDOMEN AND PELVIS WITHOUT CONTRAST TECHNIQUE: Multidetector CT imaging of the abdomen and pelvis was performed following the standard protocol without IV contrast. RADIATION DOSE REDUCTION: This exam was performed according to the departmental dose-optimization program which includes automated exposure control, adjustment of the mA and/or kV according to patient size and/or use of  iterative reconstruction technique. COMPARISON:  CT 01/14/2024, thoracic radiographs 06/20/2023 FINDINGS: Lower chest: Lung bases demonstrate no acute airspace disease. Circumferential distal esophageal thickening. Hepatobiliary: No focal liver abnormality is seen. Status post cholecystectomy. No biliary dilatation. Pancreas: Unremarkable. No pancreatic ductal dilatation or surrounding inflammatory changes. Spleen: Normal in size without focal abnormality. Adrenals/Urinary Tract: Adrenal glands are normal. The kidneys show no hydronephrosis. Slightly thick-walled urinary bladder Stomach/Bowel: Stomach nonenlarged. No dilated small bowel. Colon appears diffusely collapse but suspect that there may be diffuse colon wall thickening. Also suspect areas of small-bowel wall thickening, for example coronal series 8, image 27 Vascular/Lymphatic: Aortic atherosclerosis. No enlarged abdominal or pelvic lymph nodes. Reproductive: Calcified uterine fibroids.  No adnexal mass Other: No ascites or free air Musculoskeletal: No acute osseous abnormality. Chronic compression deformities at T10 and L2. IMPRESSION: 1. Slightly limited in the absence of contrast. Suspect diffuse colon wall thickening and areas of small-bowel wall thickening, as may be seen with enterocolitis of infectious or inflammatory etiology. 2. Circumferential distal esophageal thickening, possible esophagitis. 3. Slightly thick-walled urinary bladder, correlate with urinalysis to exclude cystitis. 4. Aortic atherosclerosis. Aortic Atherosclerosis (ICD10-I70.0). Electronically Signed   By: Luke Bun M.D.   On: 08/10/2024 23:12   DG Chest 1 View Result Date: 08/10/2024 EXAM: 1 VIEW XRAY OF THE CHEST 08/10/2024 09:56:00 PM COMPARISON: 03/11/2024 CLINICAL HISTORY: SOB FINDINGS: LUNGS AND PLEURA: No focal pulmonary opacity. No pleural effusion. No pneumothorax. HEART AND MEDIASTINUM: No acute abnormality of the cardiac and mediastinal silhouettes. Aortic arch  calcifications. BONES AND SOFT TISSUES: No acute osseous abnormality. Surgical clips in right upper quadrant. IMPRESSION: 1. No acute cardiopulmonary process. 2. Aortic Atherosclerosis (ICD10-I70.0). Electronically signed by: Pinkie Pebbles MD 08/10/2024 09:59 PM EST RP Workstation: [id number]     .Critical Care  Performed by: Gennaro Duwaine CROME, DO Authorized by: Gennaro Duwaine CROME, DO   Critical care provider statement:    Critical care time (minutes):  75   Critical care time was exclusive of:  Separately billable procedures and treating other patients and teaching time   Critical care was necessary to treat or prevent imminent or life-threatening deterioration of the following conditions:  CNS failure or compromise and sepsis   Critical care was time spent personally by me on the following activities:  Development of treatment plan with patient or surrogate, discussions with consultants, evaluation  of patient's response to treatment, examination of patient, ordering and review of laboratory studies, ordering and review of radiographic studies, ordering and performing treatments and interventions, pulse oximetry, re-evaluation of patient's condition, review of old charts and obtaining history from patient or surrogate   Care discussed with: admitting provider      Medications Ordered in the ED  lactated ringers  bolus 1,000 mL (0 mLs Intravenous Stopped 08/10/24 2318)    And  lactated ringers  bolus 500 mL (has no administration in time range)  vancomycin  (VANCOCIN ) IVPB 1000 mg/200 mL premix (1,000 mg Intravenous New Bag/Given 08/10/24 2318)  lactated ringers  bolus 1,000 mL (1,000 mLs Intravenous New Bag/Given 08/10/24 2316)  ceFEPIme  (MAXIPIME ) 2 g in sodium chloride  0.9 % 100 mL IVPB (0 g Intravenous Stopped 08/10/24 2238)  metroNIDAZOLE  (FLAGYL ) IVPB 500 mg (500 mg Intravenous New Bag/Given 08/10/24 2225)  ondansetron  (ZOFRAN ) injection 4 mg (4 mg Intravenous Given 08/10/24 2316)   acetaminophen  (TYLENOL ) suppository 650 mg (650 mg Rectal Given 08/10/24 2317)                                    Medical Decision Making Cardiac monitor interpretation: sinus tachycardia, no ectopy   Social determinants of health: vascular dementia, poor historian   Patient here after a fall. Husband arrived at bedside and provided history. She's had nausea, vomiting and diarrhea for 3 days, got worse tonight. Had a fall tonight as well.  She was febrile and tachycardic on arrival.  Treated for sepsis and given 30 cc/kg IV fluid bolus as well as started on broad-spectrum antibiotics.  Likely with UTI based on CT abdomen pelvis, although UA is not back yet.  She has a leukocytosis.  She is not very talkative but otherwise at her baseline neurologically per her husband.  Found to have large intraparenchymal hemorrhage on her CT scan of her head.  This was discussed with Johnanna, NP neurosurgery and she will talk to the attending and get back to us  with recommendations but recommend admission to the ICU.  I spoke with Dr. Dub, critical care, he will admit the patient to the ICU for further workup and management.  I discussed all results and plan with patient and family at bedside.  Problems Addressed: Altered mental status, unspecified altered mental status type: undiagnosed new problem with uncertain prognosis Contusion of forehead, initial encounter: acute illness or injury Fall, initial encounter: acute illness or injury Intracranial hemorrhage (HCC): acute illness or injury that poses a threat to life or bodily functions Sepsis, due to unspecified organism, unspecified whether acute organ dysfunction present [facility]): acute illness or injury that poses a threat to life or bodily functions Urinary tract infection without hematuria, site unspecified: acute illness or injury that poses a threat to life or bodily functions  Amount and/or Complexity of Data Reviewed Independent Historian:  spouse    Details: Spouse at bedside helps provide history-states she has had some nausea vomiting diarrhea for 3 days, but seem to get worse tonight.  States she had a fall. External Data Reviewed: notes.    Details: Prior hospital records reviewed and patient admitted 03-11-2024 for sepsis Labs: ordered. Decision-making details documented in ED Course.    Details: Ordered and reviewed by me patient has elevated lactate, leukocytosis, likely UTI Radiology: ordered and independent interpretation performed. Decision-making details documented in ED Course.    Details: Ordered and interpreted by me independently of radiology  CT abdomen pelvis: Shows evidence of probable UTI Chest x-ray: Shows no acute abnormality CT head: Was discussed with both radiology and neurosurgery, shows large intraparenchymal hemorrhage ECG/medicine tests: ordered and independent interpretation performed. Decision-making details documented in ED Course.    Details: Ordered and interpreted by me in the absence of cardiology and shows sinus tachycardia, no STEMI, or significant change when compared to prior EKG Discussion of management or test interpretation with external provider(s): Johnanna, NP for neurosurgery -she recommended ICU admission, she will speak with the attending and get back to us  with further recommendation  Dr. Katherin Darnelle care-I spoke with him on the phone regarding patient's case and he will admit the patient in the ICU for further workup and management with neurosurgical consultation  Risk OTC drugs. Prescription drug management. Drug therapy requiring intensive monitoring for toxicity. Decision regarding hospitalization. Diagnosis or treatment significantly limited by social determinants of health. Risk Details: CRITICAL CARE Performed by: Duwaine LITTIE Fusi   Total critical care time: 75 minutes  Critical care time was exclusive of separately billable procedures and treating other  patients.  Critical care was necessary to treat or prevent imminent or life-threatening deterioration.  Critical care was time spent personally by me on the following activities: development of treatment plan with patient and/or surrogate as well as nursing, discussions with consultants, evaluation of patient's response to treatment, examination of patient, obtaining history from patient or surrogate, ordering and performing treatments and interventions, ordering and review of laboratory studies, ordering and review of radiographic studies, pulse oximetry and re-evaluation of patient's condition.   Critical Care Total time providing critical care: 75 minutes     Final diagnoses:  Sepsis, due to unspecified organism, unspecified whether acute organ dysfunction present [facility])  Fall, initial encounter  Contusion of forehead, initial encounter  Altered mental status, unspecified altered mental status type  Intracranial hemorrhage (HCC)  Urinary tract infection without hematuria, site unspecified    ED Discharge Orders     None          Fusi Duwaine LITTIE, DO 08/10/24 2331  "

## 2024-08-10 NOTE — ED Triage Notes (Signed)
 Pt arrived via [facility] em[facility] from home due to n/v for the last 3 days. Tonight pt had a fall and striking head. Pt in c-collar. Pt had diarrhea on arrival in ED. Pt takes ASA 81mg  daily.

## 2024-08-10 NOTE — H&P (Addendum)
 "  NAME:  [name], MRN:  [id number], DOB:  10-01-1940, LOS: 0 ADMISSION DATE:  08/10/2024, CONSULTATION DATE:  08/10/2024 REFERRING MD: Gennaro, DO, CHIEF COMPLAINT: fall and head trauma    History of Present Illness:  A 84 yr old female patient with vascular dementia, anemia of chronic illness, GERD, bilateral ureteral stents, and prior TIA, presents for evaluation of nausea, vomiting, diarrhea for 3 days and fall tonight.  She is poor historian, does not answer questions appropriately.  History obtained from EMS.  Patient had a fall tonight in which she hit her head as well.  She is on aspirin  but no other blood thinners.  Further history limited at this time. Husband has left ED.  Pertinent  Medical History  Vascular dementia, anemia of chronic illness, GERD, bilateral ureteral stents, prior TIA Significant Hospital Events: Including procedures, antibiotic start and stop dates in addition to other pertinent events   August 2025: Cystoscopy, bilateral ureteroscopy, laser lithotripsy, basket stone extraction. Left 1F x 22cm ureteral stent exchange, right 1Fr x 22cm ureteral stent placement, and right retrograde pyelogram  Interim History / Subjective:    Objective    Blood pressure 131/63, pulse (!) 103, temperature (!) 101.5 F (38.6 C), temperature source Rectal, resp. rate (!) 21, height 4' 10 (1.473 m), weight 45.6 kg, SpO2 99%.        Intake/Output Summary (Last 24 hours) at 08/10/2024 2338 Last data filed at 08/10/2024 2256 Gross per 24 hour  Intake 92.7 ml  Output --  Net 92.7 ml   Filed Weights   08/10/24 2048  Weight: 45.6 kg    Examination: General: sleepy, easily arousable, disoriented, and comfortable. Follows commands intermittently. On RA. SpO2 99%. In sinus. V/S are stable.  HENT: PERL, normal pharynx and oral mucosa. No LNE or thyromegaly. No JVD. Neck collar. Right frontal bruise  Lungs: symmetrical air entry bilaterally. No crackles or  wheezing Cardiovascular: NL S1/S2. No m/g/r Abdomen: no distension or tenderness Extremities: no edema. Symmetrical  Neuro: nonfocal   Resolved problem list   Assessment and Plan  Multiple large acute intraparenchymal hemorrhages centered in the left parietal and occipital lobes, without midline shift Admit to neuroICU HOB elevation  SBP<160 mmHg IVF Monitor Na I/O chart NeuroSx consult Repeat CT head wo contrast after 6 hrs Keppra  NPO F/u CT cervical spine Neuro checks D/c ASA PT/INR/PTT Glycemic control SCD for DVT prophylaxis H2B for GI prophylaxis ST/PT/OT eval  Possible sepsis, ?GI source  Bcx2 Flagyl  and Rocephin  IVF Serial LA  Lactic acidosis due to nausea, vomiting, and diarrhea As above Serial LA   Vascular dementia, anxiety, depression  Monitor for encephalopathy Hold home meds till ST eval  Ammonia level  Anemia of chronic illness Monitor Am Vit B12, folate, iron studies  GERD  H2B  Labs   CBC: Recent Labs  Lab 08/10/24 2135 08/10/24 2148  WBC 15.4*  --   NEUTROABS 13.6*  --   HGB 10.2* 9.9*  HCT 30.6* 29.0*  MCV 95.6  --   PLT 268  --     Basic Metabolic Panel: Recent Labs  Lab 08/10/24 2135 08/10/24 2148  NA 139 139  K 4.0 3.8  CL 99 100  CO2 28  --   GLUCOSE 130* 128*  BUN 19 19  CREATININE 0.98 1.10*  CALCIUM 9.8  --    GFR: Estimated Creatinine Clearance: 24.6 mL/min (A) (by C-G formula based on SCr of 1.1 mg/dL (H)). Recent Labs  Lab 08/10/24  2135 08/10/24 2155  WBC 15.4*  --   LATICACIDVEN  --  2.0*    Liver Function Tests: No results for input(s): AST, ALT, ALKPHOS, BILITOT, PROT, ALBUMIN in the last 168 hours. No results for input(s): LIPASE, AMYLASE in the last 168 hours. No results for input(s): AMMONIA in the last 168 hours.  ABG    Component Value Date/Time   HCO3 28.1 (H) 03/11/2024 1753   TCO2 26 08/10/2024 2148   O2SAT 52 03/11/2024 1753     Coagulation Profile: No  results for input(s): INR, PROTIME in the last 168 hours.  Cardiac Enzymes: No results for input(s): CKTOTAL, CKMB, CKMBINDEX, TROPONINI in the last 168 hours.  HbA1C: No results found for: HGBA1C  CBG: No results for input(s): GLUCAP in the last 168 hours.  Review of Systems:   AMS  Past Medical History:  She,  has a past medical history of Anemia, Anxiety, Arthritis, Chronic kidney disease, COPD (chronic obstructive pulmonary disease) (HCC), Dementia (HCC), GERD (gastroesophageal reflux disease), History of kidney stones, Macular degeneration of left eye, Smoker, and Stroke (HCC).   Surgical History:   Past Surgical History:  Procedure Laterality Date   CHOLECYSTECTOMY  10/24/2010   Laparoscopic   COLECTOMY  07/22/2004   BENIGN TUMOR   CYSTOSCOPY W/ URETERAL STENT PLACEMENT Left 01/14/2024   Procedure: CYSTOSCOPY, WITH RETROGRADE PYELOGRAM AND URETERAL STENT INSERTION;  Surgeon: Elisabeth Valli BIRCH, MD;  Location: [facility] OR;  Service: Urology;  Laterality: Left;   CYSTOSCOPY/URETEROSCOPY/HOLMIUM LASER/STENT PLACEMENT Left 03/11/2024   Procedure: CYSTOSCOPY/URETEROSCOPY/HOLMIUM LASER/STENT PLACEMENT;  Surgeon: Elisabeth Valli BIRCH, MD;  Location: [facility];  Service: Urology;  Laterality: Left;  CYSTOSCOPY/LEFT URETEROSCOPY/HOLMIUM LASER/STENT EXCHANGE   DILATION AND CURETTAGE OF UTERUS     EYE SURGERY Bilateral    cataract extraction   TRANSESOPHAGEAL ECHOCARDIOGRAM (CATH LAB) N/A 10/17/2023   Procedure: TRANSESOPHAGEAL ECHOCARDIOGRAM;  Surgeon: Pietro Redell RAMAN, MD;  Location: [facility] INVASIVE CV LAB;  Service: Cardiovascular;  Laterality: N/A;     Social History:   reports that she has been smoking cigarettes. She has a 50 pack-year smoking history. She has never used smokeless tobacco. She reports that she does not drink alcohol  and does not use drugs.   Family History:  Her family history includes Cancer in her brother and sister; Diabetes in her brother and sister; Heart  disease in her brother. There is no history of Breast cancer.   Allergies Allergies[1]   Home Medications  Prior to Admission medications  Medication Sig Start Date End Date Taking? Authorizing Provider  aspirin  EC 81 MG tablet Take 81 mg by mouth in the morning.    [provider]  bismuth subsalicylate (PEPTO BISMOL) 262 MG/15ML suspension Take 30 mLs by mouth every 6 (six) hours as needed for indigestion or diarrhea or loose stools.    [provider]  BLINK TEARS 0.25 % SOLN Place 1 drop into both eyes in the morning.    [provider]  Calcium-Vitamin D-Vitamin K 650-12.5-40 MG-MCG-MCG CHEW Chew 1 tablet by mouth in the morning.    [provider]  cyanocobalamin  (VITAMIN B12) 1000 MCG tablet Take 1,000 mcg by mouth in the morning.    [provider]  donepezil  (ARICEPT ) 10 MG tablet Take 10 mg by mouth in the morning. 04/07/23 04/06/24  [provider]  escitalopram  (LEXAPRO ) 20 MG tablet Take 20 mg by mouth in the morning.    [provider]  Ibuprofen  200 MG CAPS Take 200 mg by  mouth every 8 (eight) hours as needed (pain/headaches.).    [provider]  memantine  (NAMENDA ) 5 MG tablet Take 5 mg by mouth in the morning and at bedtime.    [provider]  mirabegron  ER (MYRBETRIQ ) 50 MG TB24 tablet Take 50 mg by mouth in the morning.    [provider]  Multiple Vitamins-Minerals (ONE A DAY WOMEN 50 PLUS PO) Take 1 tablet by mouth in the morning.    [provider]  Multiple Vitamins-Minerals (PRESERVISION AREDS 2) CAPS Take 1 capsule by mouth in the morning and at bedtime.    [provider]  pantoprazole  (PROTONIX ) 40 MG tablet Take 1 tablet (40 mg total) by mouth daily. Patient taking differently: Take 40 mg by mouth 2 (two) times daily. 01/16/24 03/11/24  [name], MD  Probiotic Product (PROBIOTIC PO) Take 1 capsule by mouth in the morning.    [provider]      Critical care time: 55 min    Mancel Ply, MD [facility] Pager: see AMION               [1]  Allergies Allergen Reactions   Sulfa Antibiotics Itching and Rash   "

## 2024-08-10 NOTE — Progress Notes (Signed)
 Pt presented to [facility] ED via EMS after a fall at home preceded by about 3 days of nausea/vomiting. PMH dementia at baseline, however MAEs and following commands. Not currently anticoagulated. She is alert. Workup revealing L posterior IPH w/o significant MLS or mass effect. Recommend repeat CTH 6H, Keppra  500mg  BID, neuro checks, SBP<160. Formal consult to follow.   Staphanie Harbison CAYLIN Antone Summons, PA-C

## 2024-08-10 NOTE — Sepsis Progress Note (Signed)
 Following for sepsis monitoring ?

## 2024-08-10 NOTE — H&P (Incomplete)
 "  NAME:  [name], MRN:  [id number], DOB:  [birth year], LOS: 0 ADMISSION DATE:  08/10/2024, CONSULTATION DATE:  08/10/2024 REFERRING MD: Gennaro, DO, CHIEF COMPLAINT: fall and head trauma    History of Present Illness:  A 84 yr old female patient with dementia, anemia of chronic illness, GERD, bilateral ureteral stents, and prior TIA, presents for evaluation of nausea, vomiting, diarrhea for 3 days and fall tonight.  She is poor historian, does not answer questions appropriately.  History obtained from EMS.  Patient had a fall tonight in which she hit her head as well.  She is on aspirin  but no other blood thinners.  Further history limited at this time. Husband has left ED.  Pertinent  Medical History  Vascular dementia, anemia of chronic illness, GERD, bilateral ureteral stents, prior TIA Significant Hospital Events: Including procedures, antibiotic start and stop dates in addition to other pertinent events   August 2025: Cystoscopy, bilateral ureteroscopy, laser lithotripsy, basket stone extraction. Left 31F x 22cm ureteral stent exchange, right 31Fr x 22cm ureteral stent placement, and right retrograde pyelogram  Interim History / Subjective:    Objective    Blood pressure 131/63, pulse (!) 103, temperature (!) 101.5 F (38.6 C), temperature source Rectal, resp. rate (!) 21, height 4' 10 (1.473 m), weight 45.6 kg, SpO2 99%.        Intake/Output Summary (Last 24 hours) at 08/10/2024 2338 Last data filed at 08/10/2024 2256 Gross per 24 hour  Intake 92.7 ml  Output --  Net 92.7 ml   Filed Weights   08/10/24 2048  Weight: 45.6 kg    Examination: General: sleepy, easily arousable, disoriented, and comfortable. Follows commands intermittently. On RA. SpO2 99%. In sinus. V/S are stable.  HENT: PERL, normal pharynx and oral mucosa. No LNE or thyromegaly. No JVD. Neck collar. Right frontal bruise  Lungs: symmetrical air entry bilaterally. No crackles or wheezing Cardiovascular: NL  S1/S2. No m/g/r Abdomen: no distension or tenderness Extremities: no edema. Symmetrical  Neuro: nonfocal   Resolved problem list   Assessment and Plan  Multiple large acute intraparenchymal hemorrhages centered in the left parietal and occipital lobes, without midline shifas detailed above.   Labs   CBC: Recent Labs  Lab 08/10/24 2135 08/10/24 2148  WBC 15.4*  --   NEUTROABS 13.6*  --   HGB 10.2* 9.9*  HCT 30.6* 29.0*  MCV 95.6  --   PLT 268  --     Basic Metabolic Panel: Recent Labs  Lab 08/10/24 2135 08/10/24 2148  NA 139 139  K 4.0 3.8  CL 99 100  CO2 28  --   GLUCOSE 130* 128*  BUN 19 19  CREATININE 0.98 1.10*  CALCIUM 9.8  --    GFR: Estimated Creatinine Clearance: 24.6 mL/min (A) (by C-G formula based on SCr of 1.1 mg/dL (H)). Recent Labs  Lab 08/10/24 2135 08/10/24 2155  WBC 15.4*  --   LATICACIDVEN  --  2.0*    Liver Function Tests: No results for input(s): AST, ALT, ALKPHOS, BILITOT, PROT, ALBUMIN in the last 168 hours. No results for input(s): LIPASE, AMYLASE in the last 168 hours. No results for input(s): AMMONIA in the last 168 hours.  ABG    Component Value Date/Time   HCO3 28.1 (H) 03/11/2024 1753   TCO2 26 08/10/2024 2148   O2SAT 52 03/11/2024 1753     Coagulation Profile: No results for input(s): INR, PROTIME in the last 168 hours.  Cardiac Enzymes: No  results for input(s): CKTOTAL, CKMB, CKMBINDEX, TROPONINI in the last 168 hours.  HbA1C: No results found for: HGBA1C  CBG: No results for input(s): GLUCAP in the last 168 hours.  Review of Systems:   ***  Past Medical History:  She,  has a past medical history of Anemia, Anxiety, Arthritis, Chronic kidney disease, COPD (chronic obstructive pulmonary disease) (HCC), Dementia (HCC), GERD (gastroesophageal reflux disease), History of kidney stones, Macular degeneration of left eye, Smoker, and Stroke (HCC).   Surgical History:   Past  Surgical History:  Procedure Laterality Date   CHOLECYSTECTOMY  10/24/2010   Laparoscopic   COLECTOMY  07/22/2004   BENIGN TUMOR   CYSTOSCOPY W/ URETERAL STENT PLACEMENT Left 01/14/2024   Procedure: CYSTOSCOPY, WITH RETROGRADE PYELOGRAM AND URETERAL STENT INSERTION;  Surgeon: Elisabeth Valli BIRCH, MD;  Location: [facility] OR;  Service: Urology;  Laterality: Left;   CYSTOSCOPY/URETEROSCOPY/HOLMIUM LASER/STENT PLACEMENT Left 03/11/2024   Procedure: CYSTOSCOPY/URETEROSCOPY/HOLMIUM LASER/STENT PLACEMENT;  Surgeon: Elisabeth Valli BIRCH, MD;  Location: [facility];  Service: Urology;  Laterality: Left;  CYSTOSCOPY/LEFT URETEROSCOPY/HOLMIUM LASER/STENT EXCHANGE   DILATION AND CURETTAGE OF UTERUS     EYE SURGERY Bilateral    cataract extraction   TRANSESOPHAGEAL ECHOCARDIOGRAM (CATH LAB) N/A 10/17/2023   Procedure: TRANSESOPHAGEAL ECHOCARDIOGRAM;  Surgeon: Pietro Redell RAMAN, MD;  Location: [facility] INVASIVE CV LAB;  Service: Cardiovascular;  Laterality: N/A;     Social History:   reports that she has been smoking cigarettes. She has a 50 pack-year smoking history. She has never used smokeless tobacco. She reports that she does not drink alcohol  and does not use drugs.   Family History:  Her family history includes Cancer in her brother and sister; Diabetes in her brother and sister; Heart disease in her brother. There is no history of Breast cancer.   Allergies Allergies[1]   Home Medications  Prior to Admission medications  Medication Sig Start Date End Date Taking? Authorizing Provider  aspirin  EC 81 MG tablet Take 81 mg by mouth in the morning.    [provider]  bismuth subsalicylate (PEPTO BISMOL) 262 MG/15ML suspension Take 30 mLs by mouth every 6 (six) hours as needed for indigestion or diarrhea or loose stools.    [provider]  BLINK TEARS 0.25 % SOLN Place 1 drop into both eyes in the morning.    [provider]  Calcium-Vitamin D-Vitamin K 650-12.5-40 MG-MCG-MCG CHEW  Chew 1 tablet by mouth in the morning.    [provider]  cyanocobalamin  (VITAMIN B12) 1000 MCG tablet Take 1,000 mcg by mouth in the morning.    [provider]  donepezil  (ARICEPT ) 10 MG tablet Take 10 mg by mouth in the morning. 04/07/23 04/06/24  [provider]  escitalopram  (LEXAPRO ) 20 MG tablet Take 20 mg by mouth in the morning.    [provider]  Ibuprofen  200 MG CAPS Take 200 mg by mouth every 8 (eight) hours as needed (pain/headaches.).    [provider]  memantine  (NAMENDA ) 5 MG tablet Take 5 mg by mouth in the morning and at bedtime.    [provider]  mirabegron  ER (MYRBETRIQ ) 50 MG TB24 tablet Take 50 mg by mouth in the morning.    [provider]  Multiple Vitamins-Minerals (ONE A DAY WOMEN 50 PLUS PO) Take 1 tablet by mouth in the morning.    [provider]  Multiple Vitamins-Minerals (PRESERVISION AREDS 2) CAPS Take 1 capsule by mouth in the morning and at bedtime.    [provider]  pantoprazole  (PROTONIX ) 40 MG tablet Take 1 tablet (40 mg total) by mouth daily. Patient taking differently: Take 40 mg by mouth 2 (two) times daily. 01/16/24 03/11/24  [name], MD  Probiotic Product (PROBIOTIC PO) Take 1 capsule by mouth in the morning.    [provider]     Critical care time: ***                [1] Allergies Allergen Reactions   Sulfa Antibiotics Itching and Rash  "

## 2024-08-10 NOTE — ED Notes (Signed)
 CCMD called.

## 2024-08-11 ENCOUNTER — Inpatient Hospital Stay (HOSPITAL_COMMUNITY)

## 2024-08-11 DIAGNOSIS — A419 Sepsis, unspecified organism: Secondary | ICD-10-CM | POA: Diagnosis not present

## 2024-08-11 DIAGNOSIS — S0636AA Traumatic hemorrhage of cerebrum, unspecified, with loss of consciousness status unknown, initial encounter: Secondary | ICD-10-CM | POA: Diagnosis not present

## 2024-08-11 DIAGNOSIS — S066XAA Traumatic subarachnoid hemorrhage with loss of consciousness status unknown, initial encounter: Secondary | ICD-10-CM | POA: Diagnosis not present

## 2024-08-11 DIAGNOSIS — D638 Anemia in other chronic diseases classified elsewhere: Secondary | ICD-10-CM | POA: Diagnosis not present

## 2024-08-11 DIAGNOSIS — E872 Acidosis, unspecified: Secondary | ICD-10-CM | POA: Diagnosis not present

## 2024-08-11 DIAGNOSIS — W19XXXA Unspecified fall, initial encounter: Secondary | ICD-10-CM

## 2024-08-11 DIAGNOSIS — R197 Diarrhea, unspecified: Secondary | ICD-10-CM

## 2024-08-11 DIAGNOSIS — S0635AA Traumatic hemorrhage of left cerebrum with loss of consciousness status unknown, initial encounter: Secondary | ICD-10-CM

## 2024-08-11 DIAGNOSIS — E440 Moderate protein-calorie malnutrition: Secondary | ICD-10-CM | POA: Insufficient documentation

## 2024-08-11 DIAGNOSIS — K529 Noninfective gastroenteritis and colitis, unspecified: Secondary | ICD-10-CM | POA: Diagnosis not present

## 2024-08-11 DIAGNOSIS — N390 Urinary tract infection, site not specified: Secondary | ICD-10-CM | POA: Diagnosis not present

## 2024-08-11 DIAGNOSIS — E876 Hypokalemia: Secondary | ICD-10-CM | POA: Diagnosis not present

## 2024-08-11 DIAGNOSIS — F32A Depression, unspecified: Secondary | ICD-10-CM

## 2024-08-11 DIAGNOSIS — K219 Gastro-esophageal reflux disease without esophagitis: Secondary | ICD-10-CM | POA: Diagnosis not present

## 2024-08-11 DIAGNOSIS — R4182 Altered mental status, unspecified: Secondary | ICD-10-CM

## 2024-08-11 DIAGNOSIS — S065XAA Traumatic subdural hemorrhage with loss of consciousness status unknown, initial encounter: Secondary | ICD-10-CM | POA: Diagnosis not present

## 2024-08-11 LAB — HEPATIC FUNCTION PANEL
ALT: 11 U/L (ref 0–44)
AST: 30 U/L (ref 15–41)
Albumin: 3.8 g/dL (ref 3.5–5.0)
Alkaline Phosphatase: 54 U/L (ref 38–126)
Bilirubin, Direct: 0.1 mg/dL (ref 0.0–0.2)
Indirect Bilirubin: 0.2 mg/dL — ABNORMAL LOW (ref 0.3–0.9)
Total Bilirubin: 0.3 mg/dL (ref 0.0–1.2)
Total Protein: 6.6 g/dL (ref 6.5–8.1)

## 2024-08-11 LAB — GLUCOSE, CAPILLARY
Glucose-Capillary: 87 mg/dL (ref 70–99)
Glucose-Capillary: 101 mg/dL — ABNORMAL HIGH (ref 70–99)
Glucose-Capillary: 89 mg/dL (ref 70–99)
Glucose-Capillary: 83 mg/dL (ref 70–99)
Glucose-Capillary: 111 mg/dL — ABNORMAL HIGH (ref 70–99)
Glucose-Capillary: 114 mg/dL — ABNORMAL HIGH (ref 70–99)

## 2024-08-11 LAB — URINALYSIS, W/ REFLEX TO CULTURE (INFECTION SUSPECTED)
Bilirubin Urine: NEGATIVE
Glucose, UA: NEGATIVE mg/dL
Hgb urine dipstick: NEGATIVE
Ketones, ur: 20 mg/dL — AB
Nitrite: NEGATIVE
Protein, ur: NEGATIVE mg/dL
Specific Gravity, Urine: 1.012 (ref 1.005–1.030)
pH: 8 (ref 5.0–8.0)

## 2024-08-11 LAB — RESPIRATORY PANEL BY PCR

## 2024-08-11 LAB — APTT: aPTT: 26 s (ref 24–36)

## 2024-08-11 LAB — AMMONIA: Ammonia: 19 umol/L (ref 9–35)

## 2024-08-11 LAB — VITAMIN B12: Vitamin B-12: 1221 pg/mL — ABNORMAL HIGH (ref 180–914)

## 2024-08-11 LAB — FERRITIN: Ferritin: 62 ng/mL (ref 11–307)

## 2024-08-11 LAB — PROTIME-INR
INR: 1.1 (ref 0.8–1.2)
Prothrombin Time: 14.3 s (ref 11.4–15.2)

## 2024-08-11 LAB — FOLATE: Folate: >20 ng/mL

## 2024-08-11 LAB — IRON AND TIBC
Iron: 35 ug/dL (ref 28–170)
Saturation Ratios: 12 % (ref 10.4–31.8)
TIBC: 301 ug/dL (ref 250–450)
UIBC: 266 ug/dL

## 2024-08-11 LAB — CBC
HCT: 29.9 % — ABNORMAL LOW (ref 36.0–46.0)
Hemoglobin: 10.3 g/dL — ABNORMAL LOW (ref 12.0–15.0)
MCH: 32.7 pg (ref 26.0–34.0)
MCHC: 34.4 g/dL (ref 30.0–36.0)
MCV: 94.9 fL (ref 80.0–100.0)
Platelets: 246 K/uL (ref 150–400)
RBC: 3.15 MIL/uL — ABNORMAL LOW (ref 3.87–5.11)
RDW: 13.6 % (ref 11.5–15.5)
WBC: 13.5 K/uL — ABNORMAL HIGH (ref 4.0–10.5)
nRBC: 0 % (ref 0.0–0.2)

## 2024-08-11 LAB — BASIC METABOLIC PANEL WITH GFR
Anion gap: 12 (ref 5–15)
BUN: 17 mg/dL (ref 8–23)
CO2: 26 mmol/L (ref 22–32)
Calcium: 9.1 mg/dL (ref 8.9–10.3)
Chloride: 99 mmol/L (ref 98–111)
Creatinine, Ser: 1 mg/dL (ref 0.44–1.00)
GFR, Estimated: 56 mL/min — ABNORMAL LOW
Glucose, Bld: 91 mg/dL (ref 70–99)
Potassium: 3.8 mmol/L (ref 3.5–5.1)
Sodium: 137 mmol/L (ref 135–145)

## 2024-08-11 LAB — PHOSPHORUS: Phosphorus: 3.4 mg/dL (ref 2.5–4.6)

## 2024-08-11 LAB — HEMOGLOBIN A1C
Hgb A1c MFr Bld: 5.4 % (ref 4.8–5.6)
Mean Plasma Glucose: 108.28 mg/dL

## 2024-08-11 LAB — I-STAT CG4 LACTIC ACID, ED: Lactic Acid, Venous: 1.9 mmol/L (ref 0.5–1.9)

## 2024-08-11 LAB — CBG MONITORING, ED: Glucose-Capillary: 143 mg/dL — ABNORMAL HIGH (ref 70–99)

## 2024-08-11 LAB — MAGNESIUM: Magnesium: 1.7 mg/dL (ref 1.7–2.4)

## 2024-08-11 LAB — MRSA NEXT GEN BY PCR, NASAL: MRSA by PCR Next Gen: NOT DETECTED

## 2024-08-11 MED ORDER — ORAL CARE MOUTH RINSE
15.0000 mL | OROMUCOSAL | Status: DC
  Administered 2024-08-11 – 2024-08-26 (×59): 15 mL via OROMUCOSAL

## 2024-08-11 MED ORDER — ACETAMINOPHEN 325 MG PO TABS
650.0000 mg | ORAL_TABLET | Freq: Four times a day (QID) | ORAL | Status: DC | PRN
  Administered 2024-08-11 – 2024-08-15 (×11): 650 mg
  Filled 2024-08-11 (×12): qty 2

## 2024-08-11 MED ORDER — ORAL CARE MOUTH RINSE: 15.0000 mL | OROMUCOSAL | Status: DC | PRN

## 2024-08-11 MED ORDER — THIAMINE MONONITRATE 100 MG PO TABS
100.0000 mg | ORAL_TABLET | Freq: Every day | ORAL | Status: AC
  Administered 2024-08-11 – 2024-08-17 (×7): 100 mg
  Filled 2024-08-11 (×7): qty 1

## 2024-08-11 MED ORDER — GADOBUTROL 1 MMOL/ML IV SOLN
4.5000 mL | Freq: Once | INTRAVENOUS | Status: AC | PRN
  Administered 2024-08-11: 4.5 mL via INTRAVENOUS

## 2024-08-11 MED ORDER — LORAZEPAM 2 MG/ML IJ SOLN
1.0000 mg | Freq: Once | INTRAMUSCULAR | Status: AC
  Administered 2024-08-11: 1 mg via INTRAVENOUS

## 2024-08-11 MED ORDER — FAMOTIDINE 20 MG PO TABS
20.0000 mg | ORAL_TABLET | Freq: Every day | ORAL | Status: DC
  Administered 2024-08-12: 20 mg
  Filled 2024-08-11: qty 1

## 2024-08-11 MED ORDER — LORAZEPAM 2 MG/ML IJ SOLN
INTRAMUSCULAR | Status: AC
  Filled 2024-08-11: qty 1

## 2024-08-11 MED ORDER — ACETAMINOPHEN 650 MG RE SUPP: 650.0000 mg | Freq: Four times a day (QID) | RECTAL | Status: DC | PRN

## 2024-08-11 MED ORDER — LABETALOL HCL 5 MG/ML IV SOLN: 10.0000 mg | INTRAVENOUS | Status: DC | PRN

## 2024-08-11 MED ORDER — LEVETIRACETAM (KEPPRA) 500 MG/5 ML ADULT IV PUSH
500.0000 mg | Freq: Two times a day (BID) | INTRAVENOUS | Status: DC
  Administered 2024-08-11 – 2024-08-13 (×4): 500 mg via INTRAVENOUS
  Filled 2024-08-11 (×5): qty 5

## 2024-08-11 MED ORDER — ONDANSETRON HCL 4 MG/2ML IJ SOLN
4.0000 mg | Freq: Four times a day (QID) | INTRAMUSCULAR | Status: DC | PRN
  Administered 2024-08-13 – 2024-08-22 (×2): 4 mg via INTRAVENOUS
  Filled 2024-08-11 (×2): qty 2

## 2024-08-11 MED ORDER — PROSOURCE TF20 ENFIT COMPATIBL EN LIQD
60.0000 mL | Freq: Every day | ENTERAL | Status: DC
  Administered 2024-08-11 – 2024-08-24 (×14): 60 mL
  Filled 2024-08-11 (×14): qty 60

## 2024-08-11 MED ORDER — ACETAMINOPHEN 650 MG RE SUPP
650.0000 mg | Freq: Four times a day (QID) | RECTAL | Status: DC | PRN
  Administered 2024-08-11: 650 mg via RECTAL
  Filled 2024-08-11: qty 1

## 2024-08-11 MED ORDER — HYDRALAZINE HCL 20 MG/ML IJ SOLN: 10.0000 mg | INTRAMUSCULAR | Status: DC | PRN

## 2024-08-11 MED ORDER — SODIUM CHLORIDE 0.9 % IV SOLN
2.0000 g | INTRAVENOUS | Status: DC
  Administered 2024-08-11 – 2024-08-12 (×2): 2 g via INTRAVENOUS
  Filled 2024-08-11 (×2): qty 20

## 2024-08-11 MED ORDER — OSMOLITE 1.5 CAL PO LIQD
1000.0000 mL | ORAL | Status: DC
  Administered 2024-08-11 – 2024-08-21 (×7): 1000 mL
  Filled 2024-08-11 (×2): qty 1000

## 2024-08-11 MED ORDER — METRONIDAZOLE 500 MG/100ML IV SOLN
500.0000 mg | Freq: Two times a day (BID) | INTRAVENOUS | Status: DC
  Administered 2024-08-11: 500 mg via INTRAVENOUS
  Filled 2024-08-11: qty 100

## 2024-08-11 MED ORDER — ADULT MULTIVITAMIN W/MINERALS CH
1.0000 | ORAL_TABLET | Freq: Every day | ORAL | Status: DC
  Administered 2024-08-11 – 2024-08-25 (×15): 1
  Filled 2024-08-11 (×15): qty 1

## 2024-08-11 NOTE — Progress Notes (Signed)
 SLP Cancellation Note  Patient Details Name: [name] MRN: [id number] DOB: February 28, 1941   Cancelled treatment:       Reason Eval/Treat Not Completed: Patient's level of consciousness Per RN, patient has not been alert and not appropriate for SLP swallow evaluation. SLP will follow for readiness.   Norleen IVAR Blase, MA, CCC-SLP Speech Therapy  08/11/2024, 10:14 AM

## 2024-08-11 NOTE — Progress Notes (Signed)
 eLink Physician-Brief Progress Note Patient Name: [name] DOB: Jan 23, 1941 MRN: [id number]   Date of Service  08/11/2024  HPI/Events of Note  Patient with nausea and vomiting over the last few days and a fall today admitted with multiple intraparenchymal brain hemorrhages. Patient is also suspected to have a UTI.  eICU Interventions  New Patient Evaluation.        Amontae Ng U Gadge Hermiz 08/11/2024, 1:09 AM

## 2024-08-11 NOTE — Procedures (Signed)
 Cortrak  Person Inserting Tube:  [name], RD Tube Type:  Cortrak - 43 inches Tube Size:  10 Tube Location:  Left nare Secured by: Bridle Initial Placement:  Gastric Technique Used to Measure Tube Placement:  Marking at nare/corner of mouth Cortrak Secured At:  57 cm Initial Placement Verification:  Cortrak device (Registered Dieticians Only)  Cortrak Tube Team Note:  Consult received to place a Cortrak feeding tube.   No x-ray is required. RN may begin using tube.   If the tube becomes dislodged please keep the tube and contact the Cortrak team at [email or web address] for replacement.  If after hours and replacement cannot be delayed, place a NG tube and confirm placement with an abdominal x-ray.    Meghan Elihue, MS, RDN, LDN Clinical Dietitian I Please reach out via secure chat

## 2024-08-11 NOTE — Evaluation (Signed)
 Physical Therapy Evaluation Patient Details Name: [name] MRN: [id number] DOB: 1940-09-28 Today's Date: 08/11/2024  History of Present Illness  A 84 yr old female presents for evaluation of nausea, vomiting, diarrhea for 3 days and fall Pt found to have multiple large acute intraparenchymal hemorrhages in L parietal and occipital lobes. PMH: dementia, anemia of chronic illness, GERD, bilateral ureteral stents, and prior TIA   Clinical Impression  Pt admitted with above. No family present however per PT eval from admission 02/2024 pt lives with spouse and was indep without AD. Pt now lethargic with no eye opening, no command follow, no response to noxious stimuli, and is dependent for rolling for hygiene s/p BM. Pt was observed using L UE to reach for nose. Pt holding bilat LEs in full extension and very resistant to hip/knee flexion. Acute PT to cont to follow to progress mobility and assess d/c recommendations.        If plan is discharge home, recommend the following: A lot of help with walking and/or transfers;A lot of help with bathing/dressing/bathroom;Supervision due to cognitive status;Help with stairs or ramp for entrance;Assist for transportation;Assistance with feeding;Assistance with cooking/housework;Direct supervision/assist for medications management;Direct supervision/assist for financial management   Can travel by private vehicle   No    Equipment Recommendations Other (comment) (TBD at next venue)  Recommendations for Other Services       Functional Status Assessment Patient has had a recent decline in their functional status and/or demonstrates limited ability to make significant improvements in function in a reasonable and predictable amount of time     Precautions / Restrictions Precautions Precautions: Fall Restrictions Weight Bearing Restrictions Per Provider Order: No      Mobility  Bed Mobility Overal bed mobility: Needs Assistance Bed Mobility:  Rolling Rolling: Total assist         General bed mobility comments: totalA to roll L/R for hygiene s/p BM, totalA to attempt to sit up in bed, reliant on bed egress    Transfers                   General transfer comment: unsafe    Ambulation/Gait               General Gait Details: unsafe  Stairs            Wheelchair Mobility     Tilt Bed    Modified Rankin (Stroke Patients Only) Modified Rankin (Stroke Patients Only) Pre-Morbid Rankin Score: Slight disability Modified Rankin: Severe disability     Balance Overall balance assessment:  (unsafe to attempt)                                           Pertinent Vitals/Pain Pain Assessment Pain Assessment: Faces Faces Pain Scale: No hurt    Home Living Family/patient expects to be discharged to:: Private residence Living Arrangements: Spouse/significant other Available Help at Discharge: Family;Available 24 hours/day Type of Home: House Home Access: Stairs to enter Entrance Stairs-Rails: Right Entrance Stairs-Number of Steps: 2   Home Layout: One level Home Equipment: Grab bars - tub/shower;Shower seat;Rollator (4 wheels) Additional Comments: pt poor historian, home set up taken from recent admission. per chart pt lives with spouse    Prior Function Prior Level of Function : Independent/Modified Independent             Mobility Comments: No AD,  h/o falls ADLs Comments: Ind, mostly supervision for shower transfers     Extremity/Trunk Assessment   Upper Extremity Assessment Upper Extremity Assessment:  (pt observed using L UE to reach for cortrak, no voluntary movement of R UE)    Lower Extremity Assessment Lower Extremity Assessment:  (pt very rigid and resistant to hip and knee ROM)    Cervical / Trunk Assessment Cervical / Trunk Assessment: Normal  Communication   Communication Communication: Impaired    Cognition Arousal: Lethargic Behavior During  Therapy: Flat affect   PT - Cognitive impairments: Difficult to assess Difficult to assess due to: Level of arousal                     PT - Cognition Comments: pt not opening eyes, no response to noxious stimuli, no command follow Following commands: Impaired Following commands impaired:  (no command follow)     Cueing Cueing Techniques: Verbal cues, Tactile cues     General Comments General comments (skin integrity, edema, etc.): VSS, per RN pt with fever, pt with BM, dependent for hygiene    Exercises     Assessment/Plan    PT Assessment Patient needs continued PT services  PT Problem List Decreased strength;Decreased activity tolerance;Decreased balance;Decreased mobility;Decreased coordination;Decreased cognition;Decreased knowledge of use of DME;Decreased safety awareness;Decreased knowledge of precautions       PT Treatment Interventions DME instruction;Stair training;Gait training;Functional mobility training;Therapeutic activities;Therapeutic exercise;Balance training;Neuromuscular re-education    PT Goals (Current goals can be found in the Care Plan section)  Acute Rehab PT Goals Patient Stated Goal: didn't state PT Goal Formulation: Patient unable to participate in goal setting Time For Goal Achievement: 08/25/24 Potential to Achieve Goals: Fair    Frequency Min 2X/week     Co-evaluation               AM-PAC PT 6 Clicks Mobility  Outcome Measure Help needed turning from your back to your side while in a flat bed without using bedrails?: Total Help needed moving from lying on your back to sitting on the side of a flat bed without using bedrails?: Total Help needed moving to and from a bed to a chair (including a wheelchair)?: Total Help needed standing up from a chair using your arms (e.g., wheelchair or bedside chair)?: Total Help needed to walk in hospital room?: Total Help needed climbing 3-5 steps with a railing? : Total 6 Click Score:  6    End of Session   Activity Tolerance: Patient limited by lethargy Patient left: in bed;with call bell/phone within reach;with bed alarm set;with nursing/sitter in room Nurse Communication: Mobility status PT Visit Diagnosis: Unsteadiness on feet (R26.81);Muscle weakness (generalized) (M62.81);Difficulty in walking, not elsewhere classified (R26.2)    Time: 8695-8672 PT Time Calculation (min) (ACUTE ONLY): 23 min   Charges:   PT Evaluation $PT Eval Low Complexity: 1 Low   PT General Charges $$ ACUTE PT VISIT: 1 Visit         Norene Ames, PT, DPT Acute Rehabilitation Services Secure chat preferred Office #: [phone]   Norene CHRISTELLA Ames 08/11/2024, 1:39 PM

## 2024-08-11 NOTE — Consult Note (Signed)
 NEUROLOGY CONSULT NOTE   Date of service: August 11, 2024 Patient Name: [name] MRN:  [id number] DOB:  August 22, 1940 Chief Complaint: intraparenchymal hemorrhage Requesting Provider: Harold Scholz, MD  History of Present Illness  [name] is a 84 y.o. female with hx of vascular dementia, anemia of chronic illness, GERD, bilateral ureteral stents, TIA who initially presented to the ED after a fall. She additionally had 3 days of diarrhea. She did hit her head during the fall. She is on ASA 81mg  but no other thinners. On arrival to the ED she was febrile at 38.6. CT scan showed two large intraparenchymal hemorrhages in the left parietal and occipital lobes. Neurosurgery was consulted.  Repeat CT head was stable. Patient remains grossly aphasic at this time  ICH Score:3  ROS   Unable to ascertain due to altered mental status   Past History   Past Medical History:  Diagnosis Date   Anemia    Anxiety    Arthritis    Chronic kidney disease    COPD (chronic obstructive pulmonary disease) (HCC)    Dementia (HCC)    vascular dementia   GERD (gastroesophageal reflux disease)    History of kidney stones    Macular degeneration of left eye    Wet and gets injections q monthly   Smoker    1- 1 1/2 ppd  per Husband   Stroke [facility])    TIAs    Past Surgical History:  Procedure Laterality Date   CHOLECYSTECTOMY  10/24/2010   Laparoscopic   COLECTOMY  07/22/2004   BENIGN TUMOR   CYSTOSCOPY W/ URETERAL STENT PLACEMENT Left 01/14/2024   Procedure: CYSTOSCOPY, WITH RETROGRADE PYELOGRAM AND URETERAL STENT INSERTION;  Surgeon: Elisabeth Valli BIRCH, MD;  Location: [facility] OR;  Service: Urology;  Laterality: Left;   CYSTOSCOPY/URETEROSCOPY/HOLMIUM LASER/STENT PLACEMENT Left 03/11/2024   Procedure: CYSTOSCOPY/URETEROSCOPY/HOLMIUM LASER/STENT PLACEMENT;  Surgeon: Elisabeth Valli BIRCH, MD;  Location: [facility]S;  Service: Urology;  Laterality: Left;  CYSTOSCOPY/LEFT URETEROSCOPY/HOLMIUM LASER/STENT  EXCHANGE   DILATION AND CURETTAGE OF UTERUS     EYE SURGERY Bilateral    cataract extraction   TRANSESOPHAGEAL ECHOCARDIOGRAM (CATH LAB) N/A 10/17/2023   Procedure: TRANSESOPHAGEAL ECHOCARDIOGRAM;  Surgeon: Pietro Redell RAMAN, MD;  Location: [facility] INVASIVE CV LAB;  Service: Cardiovascular;  Laterality: N/A;    Family History: Family History  Problem Relation Age of Onset   Cancer Brother    Diabetes Brother    Heart disease Brother    Cancer Sister    Diabetes Sister    Breast cancer Neg Hx     Social History  reports that she has been smoking cigarettes. She has a 50 pack-year smoking history. She has never used smokeless tobacco. She reports that she does not drink alcohol  and does not use drugs.  Allergies[1]  Medications  Current Medications[2]  Vitals   Vitals:   08/11/24 1100 08/11/24 1200 08/11/24 1211 08/11/24 1300  BP: 129/74 136/63  (!) 136/59  Pulse: 88 83  65  Resp: (!) 23 15  18   Temp:   (!) 101.2 F (38.4 C)   TempSrc:   Axillary   SpO2: (!) 88% 92%  100%  Weight:      Height:        Body mass index is 19.58 kg/m.   Physical Exam   GEN: Well-developed well-nourished woman in no acute distress HEENT: Bruise over the right forehead CVS: Regular rate rhythm Lungs: Breathing well saturating normally on room air Neurological exam  Drowsy Resists active eye opening Does not follow commands Cranials: Pupils appear equal round reactive, question mild leftward gaze preference, difficult to assess visual fields, face appears grossly symmetric Motor exam: Appears to be moving all 4 extremities at this time Withdraws to noxious stimulations in all 4   Labs/Imaging/Neurodiagnostic studies   CBC:  Recent Labs  Lab 2024-08-17 2135 August 17, 2024 2148 08/11/24 0605  WBC 15.4*  --  13.5*  NEUTROABS 13.6*  --   --   HGB 10.2* 9.9* 10.3*  HCT 30.6* 29.0* 29.9*  MCV 95.6  --  94.9  PLT 268  --  246   Basic Metabolic Panel:  Lab Results  Component Value Date    NA 137 08/11/2024   K 3.8 08/11/2024   CO2 26 08/11/2024   GLUCOSE 91 08/11/2024   BUN 17 08/11/2024   CREATININE 1.00 08/11/2024   CALCIUM 9.1 08/11/2024   GFRNONAA 56 (L) 08/11/2024   GFRAA  10/25/2010    >60        The eGFR has been calculated using the MDRD equation. This calculation has not been validated in all clinical situations. eGFR's persistently <60 mL/min signify possible Chronic Kidney Disease.   Lipid Panel: No results found for: LDLCALC HgbA1c:  Lab Results  Component Value Date   HGBA1C 5.4 08/11/2024   INR  Lab Results  Component Value Date   INR 1.1 08/11/2024   APTT  Lab Results  Component Value Date   APTT 26 08/11/2024   CT Head without contrast(Personally reviewed): Multiple large acute IPH centered in the left parietal and occipital lobes.  Small volume extra-axial extension of the hemorrhage.  Possible intraventricular extension of hemorrhage in the left occipital horn which is effaced. Follow-up head CT-stable extensive left posterior hemispheric intra-axial hemorrhages with discontinuous hyperdense blood materials roughly estimated at 76 cc.  Stable small regional subarachnoid/subdural space extension of blood.  Stable regional edema and relatively mild mass effect with no midline shift.  No evidence of amyloid angiopathy on 2024 MRI-query acute coagulopathy-this MRI was not able for my review but was available for the radiologist review.  ASSESSMENT   [name] is a 84 y.o. female with a PMH of vascular dementia, anemia of chronic illness, GERD, bilateral ureteral stents, TIA who initially presented to the ED after a fall.  CT head with extensive left parietal and occipital lobe hemorrhages with intraventricular extension and extra-axial extension of the hemorrhage. According to the repeat CT head report, radiologist has access to a comparison MRI from 2024 which had no evidence of amyloid-this is less likely to be amyloid angiopathy.   Her fall, was unwitnessed but she has extensive bruising on the right forehead.  I am not sure if the left parietal occipital bleed is a direct result of the fall or if the bleed caused the fall. She was not hypertensive on arrival with only 1 or 2 readings that are spuriously high. Although the location is not typical for a traumatic bleed, there is no clear etiology of a nontraumatic bleed-does not look like a hypertensive bleed.  Prior MRI was negative for amyloid changes. Further evaluation with MRI would be helpful. Until then, it is more likely a traumatic hemorrhage and should be treated as such. Appreciate PCCM and neurosurgical evaluation   Impression: Traumatic intracerebral hemorrhage, intraventricular hemorrhage with extension into the subarachnoid space as well as subdural space.  Possible UTI.    RECOMMENDATIONS  MRI brain with and without contrast Blood pressure goal-systolic  blood pressure 130-150 Antiplatelets and anticoagulants SCD for DVT prophylaxis Antiepileptic treatment as recommended by neurosurgery. Seizure precautions Routine EEG Also has sepsis likely due to UTI-management per primary team We will follow the imaging studies with further recommendations. Plan was discussed with primary team attending Dr. Harold and Keven Huguenin, PA-C. ______________________________________________________________________    Signed, Jorene Last, NP [facility]  CRITICAL CARE ATTESTATION Performed by: Eligio Lav, MD Total critical care time: 33 minutes Critical care time was exclusive of separately billable procedures and treating other patients and/or supervising APPs/Residents/Students Critical care was necessary to treat or prevent imminent or life-threatening deterioration. This patient is critically ill and at significant risk for neurological worsening and/or death and care requires constant monitoring. Critical care was time spent personally by me on  the following activities: development of treatment plan with patient and/or surrogate as well as nursing, discussions with consultants, evaluation of patient's response to treatment, examination of patient, obtaining history from patient or surrogate, ordering and performing treatments and interventions, ordering and review of laboratory studies, ordering and review of radiographic studies, pulse oximetry, re-evaluation of patient's condition, participation in multidisciplinary rounds and medical decision making of high complexity in the care of this patient.     [1]  Allergies Allergen Reactions   Other Diarrhea    All gel capsules cause severe diarrhea - does not matter what medication. Past reactions include AREDS, vitamin E capsules, vitamin D3 capsules, Advil  Liqui-gels. Spouse unsure if gelatin is the cause as patient has eaten Jello with no reaction.   Sulfa Antibiotics Itching and Rash  [2]  Current Facility-Administered Medications:    acetaminophen  (TYLENOL ) suppository 650 mg, 650 mg, Rectal, Q6H PRN **OR** acetaminophen  (TYLENOL ) tablet 650 mg, 650 mg, Per Tube, Q6H PRN, Merilee Linsey I, RPH, 650 mg at 08/11/24 1211   cefTRIAXone  (ROCEPHIN ) 2 g in sodium chloride  0.9 % 100 mL IVPB, 2 g, Intravenous, Q24H, Chand, Sudham, MD   Chlorhexidine  Gluconate Cloth 2 % PADS 6 each, 6 each, Topical, Daily, Albustami, Omar M, MD, 6 each at 08/11/24 0927   [START ON 08/12/2024] famotidine  (PEPCID ) tablet 20 mg, 20 mg, Per Tube, Daily, Merilee Linsey I, RPH   hydrALAZINE  (APRESOLINE ) injection 10 mg, 10 mg, Intravenous, Q4H PRN, Albustami, Mancel HERO, MD   insulin  aspart (novoLOG ) injection 0-9 Units, 0-9 Units, Subcutaneous, Q4H, Albustami, Omar M, MD, 1 Units at 08/11/24 0109   labetalol  (NORMODYNE ) injection 10 mg, 10 mg, Intravenous, Q2H PRN, Albustami, Mancel HERO, MD   levETIRAcetam  (KEPPRA ) undiluted injection 500 mg, 500 mg, Intravenous, Q12H, Chand, Sudham, MD, 500 mg at 08/11/24 1223    ondansetron  (ZOFRAN ) injection 4 mg, 4 mg, Intravenous, Q6H PRN, Albustami, Omar M, MD   Oral care mouth rinse, 15 mL, Mouth Rinse, 4 times per day, Dub Mancel HERO, MD, 15 mL at 08/11/24 1211   Oral care mouth rinse, 15 mL, Mouth Rinse, PRN, Albustami, Omar M, MD   polyethylene glycol (MIRALAX  / GLYCOLAX ) packet 17 g, 17 g, Oral, Daily PRN, Albustami, Omar M, MD   senna (SENOKOT) tablet 8.6 mg, 1 tablet, Oral, BID PRN, Albustami, Mancel HERO, MD

## 2024-08-11 NOTE — Progress Notes (Signed)
 Initial Nutrition Assessment  DOCUMENTATION CODES:   Non-severe (moderate) malnutrition in context of social or environmental circumstances  INTERVENTION:   Initiate tube feeding via Cortrak tube: Osmolite 1.5 at 15 ml/h and increase by 10 ml every 8 hours to goal rate of 35 ml/hr (840 ml per day)  Prosource TF20 60 ml daily  Provides 1340 kcal, 72 gm protein, 638 ml free water daily  Pt is at risk for refeeding syndrome given pt meets criteria for moderate malnutrition. Monitor magnesium  and phosphorus daily x 4 occurrences or until WNL, MD to replete as needed.   100 mg thiamine  daily  MVI with minerals daily   NUTRITION DIAGNOSIS:   Moderate Malnutrition related to social / environmental circumstances as evidenced by mild fat depletion, moderate muscle depletion.  GOAL:   Patient will meet greater than or equal to 90% of their needs  MONITOR:   TF tolerance  REASON FOR ASSESSMENT:   Consult Enteral/tube feeding initiation and management  ASSESSMENT:   Pt who lives at home with spouse and independent with PMH of dementia, anemia of chronic illness, CKD, COPD, GERD, prior TIA, smoker, macular degeneration of L eye, and bilateral ureteral stents admitted after 3 days of diarrhea, nausea, and vomiting who was found down after fall with multiple large acute intraparenchymal hemorrhages in L parietal and occipital lobes.   Pt discussed during ICU rounds and with RN and MD.  Pt does not wake or open eyes during assessment.  Neurosurgery following and recommends MRI due to not typical pattern for traumatic hemorrhage.   Temp (24hrs), Avg:101.9 F (38.8 C), Min:101.1 F (38.4 C), Max:102.7 F (39.3 C)  1/21 - s/p cortrak placement; tip gastric     Medications reviewed and include: pepcid , novolog  SSI every 4 hours  Home meds include pepto, calcium chews, vitamin B12, one a day womens, probiotic   Labs reviewed:  Vitamin B12 1221  Refeeding Labs: Recent Labs   Lab 08/10/24 2135 08/10/24 2148 08/11/24 0605  K 4.0 3.8 3.8  MG  --   --  1.7  PHOS  --   --  3.4   Glucose Profile:  Recent Labs    08/11/24 0422 08/11/24 0818 08/11/24 1214  GLUCAP 87 101* 89      NUTRITION - FOCUSED PHYSICAL EXAM:  Flowsheet Row Most Recent Value  Orbital Region Mild depletion  Upper Arm Region No depletion  Thoracic and Lumbar Region Moderate depletion  Buccal Region Mild depletion  Temple Region No depletion  Clavicle Bone Region Mild depletion  Clavicle and Acromion Bone Region Moderate depletion  Scapular Bone Region Unable to assess  Dorsal Hand Severe depletion  Patellar Region Moderate depletion  Anterior Thigh Region Moderate depletion  Posterior Calf Region Moderate depletion  Edema (RD Assessment) None  Hair Reviewed  Eyes Unable to assess  Mouth Unable to assess  Skin Reviewed  Nails Reviewed    Diet Order:   Diet Order             Diet NPO time specified  Diet effective now                   EDUCATION NEEDS:   Not appropriate for education at this time  Skin:  Skin Assessment: Reviewed RN Assessment  Last BM:  1/21  Height:   Ht Readings from Last 1 Encounters:  08/10/24 4' 10 (1.473 m)    Weight:   Wt Readings from Last 1 Encounters:  08/11/24 42.5 kg  BMI:  Body mass index is 19.58 kg/m.  Estimated Nutritional Needs:   Kcal:  1200-1400  Protein:  60-75 grams  Fluid:  1.5 L/day  Powell SQUIBB., RD, LDN, CNSC See AMiON for contact information

## 2024-08-11 NOTE — Progress Notes (Signed)
 Pharmacy Antibiotic Note  [name] is a 84 y.o. female admitted on 08/10/2024 after fall preceded by 3 days of N/V.  Pharmacy has been consulted for vancomycin  dosing for sepsis concerns.  -CThead: multiple large acute hemorrhages in left occipital and parietal lobes, no midline shift -  Plan: -Ceftriaxone  1g IV every 24 hours per MD -Vancomycin  1000mg  IV x1 -Vancomycin  750mg  IV every 48 hours (AUC 418, Vd 0.72, IBW, sCr 0.98) -Monitor renal function -Follow up signs of clinical improvement, LOT, de-escalation of antibiotics   Height: 4' 10 (147.3 cm) Weight: 45.6 kg (100 lb 8.5 oz) IBW/kg (Calculated) : 40.9  Temp (24hrs), Avg:101.5 F (38.6 C), Min:101.5 F (38.6 C), Max:101.5 F (38.6 C)  Recent Labs  Lab 08/10/24 2135 08/10/24 2148 08/10/24 2155  WBC 15.4*  --   --   CREATININE 0.98 1.10*  --   LATICACIDVEN  --   --  2.0*    Estimated Creatinine Clearance: 24.6 mL/min (A) (by C-G formula based on SCr of 1.1 mg/dL (H)).    Allergies[1]  Antimicrobials this admission: Ceftriaxone  1/21 >>  Vancomycin  1/20 >>   Microbiology results: 1/20 BCx:  1/21 MRSA PCR: ordered  Thank you for allowing pharmacy to be a part of this patients care.  Lynwood Poplar, PharmD, BCPS Clinical Pharmacist 08/11/2024 12:20 AM       [1]  Allergies Allergen Reactions   Sulfa Antibiotics Itching and Rash

## 2024-08-11 NOTE — Consult Note (Signed)
 " Chief Complaint   Chief Complaint  Patient presents with   Nausea   Fall   Emesis    History of Present Illness  [name] is a 84 y.o. female brought to the emergency department via EMS after she apparently had a fall last night.  She apparently has a several day history of nausea, vomiting, and diarrhea.  She has been quite confused in the emergency department prompting CT scan which did demonstrate a left occipital intraparenchymal hematoma.  Neurosurgical consultation was therefore requested.  Patient remains obtunded in the intensive care unit unable to provide history.  Past Medical History   Past Medical History:  Diagnosis Date   Anemia    Anxiety    Arthritis    Chronic kidney disease    COPD (chronic obstructive pulmonary disease) (HCC)    Dementia (HCC)    vascular dementia   GERD (gastroesophageal reflux disease)    History of kidney stones    Macular degeneration of left eye    Wet and gets injections q monthly   Smoker    1- 1 1/2 ppd  per Husband   Stroke [facility])    TIAs    Past Surgical History   Past Surgical History:  Procedure Laterality Date   CHOLECYSTECTOMY  10/24/2010   Laparoscopic   COLECTOMY  07/22/2004   BENIGN TUMOR   CYSTOSCOPY W/ URETERAL STENT PLACEMENT Left 01/14/2024   Procedure: CYSTOSCOPY, WITH RETROGRADE PYELOGRAM AND URETERAL STENT INSERTION;  Surgeon: Elisabeth Valli BIRCH, MD;  Location: [facility] OR;  Service: Urology;  Laterality: Left;   CYSTOSCOPY/URETEROSCOPY/HOLMIUM LASER/STENT PLACEMENT Left 03/11/2024   Procedure: CYSTOSCOPY/URETEROSCOPY/HOLMIUM LASER/STENT PLACEMENT;  Surgeon: Elisabeth Valli BIRCH, MD;  Location: [facility] ORS;  Service: Urology;  Laterality: Left;  CYSTOSCOPY/LEFT URETEROSCOPY/HOLMIUM LASER/STENT EXCHANGE   DILATION AND CURETTAGE OF UTERUS     EYE SURGERY Bilateral    cataract extraction   TRANSESOPHAGEAL ECHOCARDIOGRAM (CATH LAB) N/A 10/17/2023   Procedure: TRANSESOPHAGEAL ECHOCARDIOGRAM;  Surgeon: Pietro Redell RAMAN,  MD;  Location: [facility] INVASIVE CV LAB;  Service: Cardiovascular;  Laterality: N/A;    Social History  Social History[1]  Medications   Prior to Admission medications  Medication Sig Start Date End Date Taking? Authorizing Provider  aspirin  EC 81 MG tablet Take 81 mg by mouth in the morning.    [provider]  bismuth subsalicylate (PEPTO BISMOL) 262 MG/15ML suspension Take 30 mLs by mouth every 6 (six) hours as needed for indigestion or diarrhea or loose stools.    [provider]  BLINK TEARS 0.25 % SOLN Place 1 drop into both eyes in the morning.    [provider]  Calcium-Vitamin D-Vitamin K 650-12.5-40 MG-MCG-MCG CHEW Chew 1 tablet by mouth in the morning.    [provider]  cyanocobalamin  (VITAMIN B12) 1000 MCG tablet Take 1,000 mcg by mouth in the morning.    [provider]  donepezil  (ARICEPT ) 10 MG tablet Take 10 mg by mouth daily. 04/07/23 10/02/24  [provider]  escitalopram  (LEXAPRO ) 20 MG tablet Take 20 mg by mouth daily.    [provider]  Ibuprofen  200 MG CAPS Take 200 mg by mouth every 8 (eight) hours as needed (pain/headaches.).    [provider]  memantine  (NAMENDA ) 5 MG tablet Take 5 mg by mouth in the morning and at bedtime.    [provider]  mirabegron  ER (MYRBETRIQ ) 50 MG TB24 tablet Take 50 mg by mouth daily.    [provider]  Multiple Vitamins-Minerals (ONE A DAY WOMEN 50 PLUS PO) Take 1 tablet by mouth in the morning.    [provider]  Multiple Vitamins-Minerals (PRESERVISION AREDS 2) CAPS Take 1 capsule by mouth in the morning and at bedtime.    [provider]  pantoprazole  (PROTONIX ) 40 MG tablet Take 1 tablet (40 mg total) by mouth daily. 01/16/24 10/02/24  [name], MD  Probiotic Product (PROBIOTIC PO) Take 1 capsule by mouth in the morning.    [provider]    Allergies  Allergies[2]  Review of Systems   ROS  Neurologic Exam  Opens eyes to voice occasionally Groans to noxious stimuli Localizes BUE Moves BLE spontaneously  Imaging  [facility] x2 personally reviewed.  These scans demonstrate left occipital intraparenchymal and possibly subdural hemorrhage.  There may be subacute fluid/fluid levels.  There is mass effect upon the left trigone but no hydrocephalus.  Impression  - 84 y.o. female found down status post fall with relatively large left occipital intraparenchymal hemorrhage which is not a typical pattern for traumatic hemorrhage especially in a patient who is not anticoagulated.  Furthermore, patient has previous MRI which is not consistent with amyloid angiopathy.  Plan  - Would continue prophylactic Keppra  - Continue with SBP control - I will order MRI of the brain with and without contrast to evaluate for potential underlying lesion.     [1]  Social History Tobacco Use   Smoking status: Every Day    Current packs/day: 1.00    Average packs/day: 1 pack/day for 50.0 years (50.0 ttl pk-yrs)    Types: Cigarettes   Smokeless tobacco: Never  Vaping Use   Vaping status: Never Used  Substance Use Topics   Alcohol  use: No   Drug use: No  [2]  Allergies Allergen Reactions   Sulfa Antibiotics Itching and Rash   "

## 2024-08-11 NOTE — Progress Notes (Addendum)
 "  NAME:  [name], MRN:  [id number], DOB:  [birth year], LOS: 1 ADMISSION DATE:  08/10/2024, CONSULTATION DATE:  08/10/2024 REFERRING MD: Gennaro, DO, CHIEF COMPLAINT: fall and head trauma    History of Present Illness:  A 84 yr old female patient with vascular dementia, anemia of chronic illness, GERD, bilateral ureteral stents, and prior TIA, presents for evaluation of nausea, vomiting, diarrhea for 3 days and fall tonight.  She is poor historian, does not answer questions appropriately.  History obtained from EMS.  Patient had a fall tonight in which she hit her head as well.  She is on aspirin  but no other blood thinners.  Further history limited at this time. Husband has left ED.  Pertinent  Medical History  Vascular dementia, anemia of chronic illness, GERD, bilateral ureteral stents, prior TIA Significant Hospital Events: Including procedures, antibiotic start and stop dates in addition to other pertinent events   8/25: Cystoscopy, bilateral ureteroscopy, laser lithotripsy, basket stone extraction. Left 31F x 22cm ureteral stent exchange, right 31Fr x 22cm ureteral stent placement, and right retrograde pyelogram  1/21: admitted to PCCM  Interim History / Subjective:   Patient non verbal this morning, not following commands   Objective    Blood pressure (!) 111/56, pulse 70, temperature (!) 101.1 F (38.4 C), temperature source Axillary, resp. rate 16, height 4' 10 (1.473 m), weight 42.5 kg, SpO2 95%.        Intake/Output Summary (Last 24 hours) at 08/11/2024 0936 Last data filed at 08/11/2024 0800 Gross per 24 hour  Intake 3154.92 ml  Output --  Net 3154.92 ml   Filed Weights   08/10/24 2048 08/11/24 0100  Weight: 45.6 kg 42.5 kg   Physical Exam Vitals and nursing note reviewed.  Constitutional:      Comments: Elderly, chronically ill appearing. Non verbal this morning  HENT:     Head: Normocephalic and atraumatic.     Right Ear: External ear normal.     Left Ear:  External ear normal.     Nose: Nose normal.     Mouth/Throat:     Pharynx: Oropharynx is clear.  Eyes:     Pupils: Pupils are equal, round, and reactive to light.  Cardiovascular:     Rate and Rhythm: Normal rate and regular rhythm.     Pulses: Normal pulses.     Heart sounds: Normal heart sounds.  Pulmonary:     Effort: Pulmonary effort is normal. No respiratory distress.     Breath sounds: Normal breath sounds. No wheezing.  Abdominal:     General: Abdomen is flat.     Palpations: Abdomen is soft.  Musculoskeletal:     Right lower leg: No edema.     Left lower leg: No edema.  Skin:    General: Skin is warm and dry.     Capillary Refill: Capillary refill takes less than 2 seconds.     Findings: Bruising (right frontotemporal area) present.  Neurological:     Mental Status: She is alert.     Comments: Baseline dementia, non verbal this morning. Not following commands but is moving her extremities on her own          Resolved problem list   Assessment and Plan   Multiple large acute intraparenchymal hemorrhages centered in the left parietal and occipital lobes, without midline shift -Repeat CT shows stable left posterior hemorrhages, stable regional edema with mild mass effect -MRI brain w/wo contrast ordered  -Hold home Aspirin  -  NSGY following -HOB elevation  -SBP goal<160 mmHg -Monitor Na+ -Keppra  500mg  BID -NPO  Diarrhea Dehydration  Nausea with vomiting Lactic acidosis suspect due to above Sepsis secondary to GI source vs UTI  Patient presented febrile Tmax 102.65F, tachycardic, leukocytosis, source possible GI vs UTI COVID, Influenza, RSV negative. CXR without acute findings.  -Empiric Rocephin , d/c flagyl  -IV NS 40ml/hr; coretrak ordered  -Follow urine culture, blood cultures  -Check cdiff and stool panel -Check full respiratory panel  Possible acute urinary tract infection, present on arrival -Empiric Rocephin  1/21 -Culture added to follow  History  of Vascular dementia Anxiety Depression  -ammonia level pending from admission  Anemia of chronic illness -Check folate, B12, iron panel  GERD  pepcid   Past Medical History:  She,  has a past medical history of Anemia, Anxiety, Arthritis, Chronic kidney disease, COPD (chronic obstructive pulmonary disease) (HCC), Dementia (HCC), GERD (gastroesophageal reflux disease), History of kidney stones, Macular degeneration of left eye, Smoker, and Stroke (HCC).   Surgical History:   Past Surgical History:  Procedure Laterality Date   CHOLECYSTECTOMY  10/24/2010   Laparoscopic   COLECTOMY  07/22/2004   BENIGN TUMOR   CYSTOSCOPY W/ URETERAL STENT PLACEMENT Left 01/14/2024   Procedure: CYSTOSCOPY, WITH RETROGRADE PYELOGRAM AND URETERAL STENT INSERTION;  Surgeon: Elisabeth Valli BIRCH, MD;  Location: [facility] OR;  Service: Urology;  Laterality: Left;   CYSTOSCOPY/URETEROSCOPY/HOLMIUM LASER/STENT PLACEMENT Left 03/11/2024   Procedure: CYSTOSCOPY/URETEROSCOPY/HOLMIUM LASER/STENT PLACEMENT;  Surgeon: Elisabeth Valli BIRCH, MD;  Location: [facility] ORS;  Service: Urology;  Laterality: Left;  CYSTOSCOPY/LEFT URETEROSCOPY/HOLMIUM LASER/STENT EXCHANGE   DILATION AND CURETTAGE OF UTERUS     EYE SURGERY Bilateral    cataract extraction   TRANSESOPHAGEAL ECHOCARDIOGRAM (CATH LAB) N/A 10/17/2023   Procedure: TRANSESOPHAGEAL ECHOCARDIOGRAM;  Surgeon: Pietro Redell RAMAN, MD;  Location: [facility] INVASIVE CV LAB;  Service: Cardiovascular;  Laterality: N/A;     Social History:   reports that she has been smoking cigarettes. She has a 50 pack-year smoking history. She has never used smokeless tobacco. She reports that she does not drink alcohol  and does not use drugs.   Family History:  Her family history includes Cancer in her brother and sister; Diabetes in her brother and sister; Heart disease in her brother. There is no history of Breast cancer.    Yoseline Andersson, PA-C [facility] For pager  details, please see AMION or use Epic chat  After 1900, please call [facility] for cross coverage needs 08/11/2024, 10:15 AM              "

## 2024-08-11 NOTE — Progress Notes (Signed)
 Patient arrived to unit from ED. C-collar switched to [place]-j, c-spine precautions maintained. Not following commands, withdraws from pain in all extremities, does not cross midline to the right, nonverbal but will say ouch when rolling.

## 2024-08-12 ENCOUNTER — Inpatient Hospital Stay (HOSPITAL_COMMUNITY)

## 2024-08-12 DIAGNOSIS — F17210 Nicotine dependence, cigarettes, uncomplicated: Secondary | ICD-10-CM | POA: Diagnosis not present

## 2024-08-12 DIAGNOSIS — A419 Sepsis, unspecified organism: Secondary | ICD-10-CM | POA: Diagnosis not present

## 2024-08-12 DIAGNOSIS — S0636AA Traumatic hemorrhage of cerebrum, unspecified, with loss of consciousness status unknown, initial encounter: Secondary | ICD-10-CM | POA: Diagnosis not present

## 2024-08-12 DIAGNOSIS — S066XAA Traumatic subarachnoid hemorrhage with loss of consciousness status unknown, initial encounter: Secondary | ICD-10-CM | POA: Diagnosis not present

## 2024-08-12 DIAGNOSIS — Q282 Arteriovenous malformation of cerebral vessels: Secondary | ICD-10-CM

## 2024-08-12 DIAGNOSIS — N189 Chronic kidney disease, unspecified: Secondary | ICD-10-CM

## 2024-08-12 DIAGNOSIS — S0635AA Traumatic hemorrhage of left cerebrum with loss of consciousness status unknown, initial encounter: Secondary | ICD-10-CM | POA: Diagnosis not present

## 2024-08-12 DIAGNOSIS — N390 Urinary tract infection, site not specified: Secondary | ICD-10-CM | POA: Diagnosis not present

## 2024-08-12 DIAGNOSIS — I69391 Dysphagia following cerebral infarction: Secondary | ICD-10-CM

## 2024-08-12 DIAGNOSIS — R569 Unspecified convulsions: Secondary | ICD-10-CM

## 2024-08-12 DIAGNOSIS — E876 Hypokalemia: Secondary | ICD-10-CM

## 2024-08-12 DIAGNOSIS — I629 Nontraumatic intracranial hemorrhage, unspecified: Secondary | ICD-10-CM

## 2024-08-12 DIAGNOSIS — I680 Cerebral amyloid angiopathy: Secondary | ICD-10-CM | POA: Diagnosis not present

## 2024-08-12 DIAGNOSIS — E859 Amyloidosis, unspecified: Secondary | ICD-10-CM

## 2024-08-12 DIAGNOSIS — S065XAA Traumatic subdural hemorrhage with loss of consciousness status unknown, initial encounter: Secondary | ICD-10-CM | POA: Diagnosis not present

## 2024-08-12 LAB — CBC WITH DIFFERENTIAL/PLATELET
Abs Immature Granulocytes: 0.06 K/uL (ref 0.00–0.07)
Basophils Absolute: 0 K/uL (ref 0.0–0.1)
Basophils Relative: 0 %
Eosinophils Absolute: 0 K/uL (ref 0.0–0.5)
Eosinophils Relative: 0 %
HCT: 29.9 % — ABNORMAL LOW (ref 36.0–46.0)
Hemoglobin: 10.2 g/dL — ABNORMAL LOW (ref 12.0–15.0)
Immature Granulocytes: 1 %
Lymphocytes Relative: 7 %
Lymphs Abs: 0.8 K/uL (ref 0.7–4.0)
MCH: 32.5 pg (ref 26.0–34.0)
MCHC: 34.1 g/dL (ref 30.0–36.0)
MCV: 95.2 fL (ref 80.0–100.0)
Monocytes Absolute: 0.6 K/uL (ref 0.1–1.0)
Monocytes Relative: 5 %
Neutro Abs: 11 K/uL — ABNORMAL HIGH (ref 1.7–7.7)
Neutrophils Relative %: 87 %
Platelets: 224 K/uL (ref 150–400)
RBC: 3.14 MIL/uL — ABNORMAL LOW (ref 3.87–5.11)
RDW: 13.6 % (ref 11.5–15.5)
WBC: 12.6 K/uL — ABNORMAL HIGH (ref 4.0–10.5)
nRBC: 0 % (ref 0.0–0.2)

## 2024-08-12 LAB — URINE CULTURE: Culture: NO GROWTH

## 2024-08-12 LAB — HEMOGLOBIN A1C
Hgb A1c MFr Bld: 5.4 % (ref 4.8–5.6)
Mean Plasma Glucose: 108.28 mg/dL

## 2024-08-12 LAB — LIPID PANEL
Cholesterol: 159 mg/dL (ref 0–200)
HDL: 73 mg/dL
LDL Cholesterol: 70 mg/dL (ref 0–99)
Total CHOL/HDL Ratio: 2.2 ratio
Triglycerides: 77 mg/dL
VLDL: 15 mg/dL (ref 0–40)

## 2024-08-12 LAB — GLUCOSE, CAPILLARY
Glucose-Capillary: 122 mg/dL — ABNORMAL HIGH (ref 70–99)
Glucose-Capillary: 124 mg/dL — ABNORMAL HIGH (ref 70–99)
Glucose-Capillary: 139 mg/dL — ABNORMAL HIGH (ref 70–99)
Glucose-Capillary: 154 mg/dL — ABNORMAL HIGH (ref 70–99)
Glucose-Capillary: 166 mg/dL — ABNORMAL HIGH (ref 70–99)
Glucose-Capillary: 95 mg/dL (ref 70–99)

## 2024-08-12 LAB — BASIC METABOLIC PANEL WITH GFR
Anion gap: 12 (ref 5–15)
BUN: 24 mg/dL — ABNORMAL HIGH (ref 8–23)
CO2: 23 mmol/L (ref 22–32)
Calcium: 8.7 mg/dL — ABNORMAL LOW (ref 8.9–10.3)
Chloride: 104 mmol/L (ref 98–111)
Creatinine, Ser: 0.97 mg/dL (ref 0.44–1.00)
GFR, Estimated: 57 mL/min — ABNORMAL LOW
Glucose, Bld: 139 mg/dL — ABNORMAL HIGH (ref 70–99)
Potassium: 3.2 mmol/L — ABNORMAL LOW (ref 3.5–5.1)
Sodium: 138 mmol/L (ref 135–145)

## 2024-08-12 LAB — GASTROINTESTINAL PANEL BY PCR, STOOL (REPLACES STOOL CULTURE)

## 2024-08-12 LAB — MAGNESIUM: Magnesium: 2 mg/dL (ref 1.7–2.4)

## 2024-08-12 LAB — PHOSPHORUS: Phosphorus: 2.4 mg/dL — ABNORMAL LOW (ref 2.5–4.6)

## 2024-08-12 MED ORDER — POTASSIUM CHLORIDE 20 MEQ PO PACK: 40.0000 meq | PACK | ORAL | Status: DC

## 2024-08-12 MED ORDER — POTASSIUM CHLORIDE 20 MEQ PO PACK
40.0000 meq | PACK | Freq: Once | ORAL | Status: AC
  Administered 2024-08-12: 40 meq
  Filled 2024-08-12: qty 2

## 2024-08-12 MED ORDER — PANTOPRAZOLE SODIUM 40 MG IV SOLR
40.0000 mg | INTRAVENOUS | Status: DC
  Administered 2024-08-13 – 2024-08-25 (×14): 40 mg via INTRAVENOUS
  Filled 2024-08-12 (×14): qty 10

## 2024-08-12 MED ORDER — IOHEXOL 350 MG/ML SOLN
75.0000 mL | Freq: Once | INTRAVENOUS | Status: AC | PRN
  Administered 2024-08-12: 75 mL via INTRAVENOUS

## 2024-08-12 MED ORDER — MEMANTINE HCL 10 MG PO TABS
5.0000 mg | ORAL_TABLET | Freq: Two times a day (BID) | ORAL | Status: DC
  Administered 2024-08-12 – 2024-08-25 (×26): 5 mg
  Filled 2024-08-12 (×29): qty 1

## 2024-08-12 MED ORDER — SODIUM CHLORIDE 0.9 % IV SOLN
15.0000 mmol | Freq: Once | INTRAVENOUS | Status: AC
  Administered 2024-08-12: 15 mmol via INTRAVENOUS
  Filled 2024-08-12: qty 5

## 2024-08-12 NOTE — Progress Notes (Signed)
 Speech Language Pathology  Patient Details Name: [name] MRN: [id number] DOB: 1941/07/08 Today's Date: 08/12/2024 Time:  -     RN reports pt not alert for swallow assessment but will notify SLP if alertness improves.     [name]  08/12/2024, 8:56 AM

## 2024-08-12 NOTE — Progress Notes (Signed)
 PCCM Interval Progress Note:  CT ANGIO HEAD W OR WO CONTRAST   IMPRESSION: 1. Asymmetric vasculature in the superior left occipital lobe near the region of hemorrhage, suspicious for arteriovenous malformation. Consider catheter angiography for further evaluation. 2. There is arterial supply from the left PCA. Suspected venous component appears to drain into the superior sagittal sinus. 3. No evidence of intracranial aneurysm, flow-limiting stenosis, or arterial dissection. 4. Infundibulum at the right PCOM origin. 5. Similar intraparenchymal hemorrhage in the posterior left cerebral hemisphere. Intraventricular hemorrhage with increased blood products layering in the right lateral ventricle, likely reflecting redistribution. New subarachnoid hemorrhage in the right Sylvian fissure, also likely secondary to redistribution.  Discussed CTA results with patient's husband, Butch, via phone. Discussed that further intervention for possible AVM will be dependent on Joani's progress and clinical trajectory. He will be at the hospital again tomorrow to visit, will ask our team to update him further at that time.  Corean CHRISTELLA Yaniel Limbaugh, PA-C [facility] 08/12/24 5:43 PM  Please see [email or web address] for pager details.  From 7A-7P if no response, please call [phone] After hours, please call ELink [phone]

## 2024-08-12 NOTE — Progress Notes (Signed)
" °  °  Providing Compassionate, Quality Care - Together   NEUROSURGERY PROGRESS NOTE     S: NAEs. On EEG upon exam.    O: EXAM:  BP (!) 120/57 (BP Location: Left Arm)   Pulse 76   Temp 99.9 F (37.7 C) (Axillary)   Resp 17   Ht 4' 10 (1.473 m)   Wt 43.1 kg   SpO2 97%   BMI 19.86 kg/m     Groans to noxious stimuli Localizes BUE Moves BLE spontaneously   ASSESSMENT:  84 y.o. with large L occipital IPH, updated MRI w/o underlying lesion although with some suggested vascular enhancement.     PLAN: -Will obtain CTA for further evaluation -Continue SBP control -Continue Keppra  -Call w/ questions/concerns.   Camie Pickle, PAC  "

## 2024-08-12 NOTE — Progress Notes (Signed)
 EEG complete - results pending

## 2024-08-12 NOTE — Progress Notes (Signed)
 [facility] ADULT ICU REPLACEMENT PROTOCOL   The patient does apply for the [facility] Adult ICU Electrolyte Replacment Protocol based on the criteria listed below:   1.Exclusion criteria: TCTS, ECMO, Dialysis, and Myasthenia Gravis patients 2. Is GFR >/= 30 ml/min? Yes.    Patient's GFR today is 57 3. Is SCr </= 2? Yes.   Patient's SCr is 0.97 mg/dL 4. Did SCr increase >/= 0.5 in 24 hours? No. 5.Pt's weight >40kg  Yes.   6. Abnormal electrolyte(s): potassium 3.2  7. Electrolytes replaced per protocol 8.  Call MD STAT for K+ </= 2.5, Phos </= 1, or Mag </= 1 Physician:  protocol  [name] 08/12/2024 6:42 AM

## 2024-08-12 NOTE — Progress Notes (Signed)
 "  NAME:  MAAHI LANNAN, MRN:  [id number], DOB:  01/15/1941, LOS: 2 ADMISSION DATE:  08/10/2024, CONSULTATION DATE:  08/10/2024 REFERRING MD: Gennaro - EDP, CHIEF COMPLAINT: fall and head trauma    History of Present Illness:  84 yr old woman with vascular dementia, anemia of chronic illness, GERD, bilateral ureteral stents, and prior TIA, presents for evaluation of nausea, vomiting, diarrhea for 3 days and fall tonight.  She is poor historian, does not answer questions appropriately.  History obtained from EMS.  Patient had a fall tonight in which she hit her head as well.  She is on aspirin  but no other blood thinners.  Further history limited at this time. Husband has left ED.   Pertinent Medical History:  Vascular dementia, anemia of chronic illness, GERD, bilateral ureteral stents, prior TIA  Significant Hospital Events: Including procedures, antibiotic start and stop dates in addition to other pertinent events   1/21: Admitted to PCCM  Interim History / Subjective:  No significant events overnight Remains minimally responsive Grimaces to pain Some fasciculations of R cheek/lower mouth and R neck noted on exam EEG today  Objective:    Blood pressure (!) 112/49, pulse 73, temperature (!) 101.8 F (38.8 C), temperature source Axillary, resp. rate (!) 22, height 4' 10 (1.473 m), weight 43.1 kg, SpO2 95%.        Intake/Output Summary (Last 24 hours) at 08/12/2024 0802 Last data filed at 08/12/2024 0600 Gross per 24 hour  Intake 890.49 ml  Output 300 ml  Net 590.49 ml   Filed Weights   08/10/24 2048 08/11/24 0100 08/12/24 0500  Weight: 45.6 kg 42.5 kg 43.1 kg   Physical Examination: General: Chronically ill-appearing elderly woman in NAD. HEENT: [place]/AT, anicteric sclera, PERRL 3mm, moist mucous membranes. Neuro: Lethargic, minimally responsive. Responds to noxious stimuli (partially opens eyes, groans in pain). Resists eye opening. Not following commands. Grimaces with slight  withdraw from pain in all 4 extremities.+Cough and +Gag  CV: RRR, no m/g/r. PULM: Breathing even and unlabored on RA. Lung fields CTAB. GI: Soft, nontender, nondistended. Normoactive bowel sounds. Extremities: No LE edema noted. Skin: Warm/dry, no rashes.  Resolved Problem List:   Assessment and Plan:   Multiple large acute intraparenchymal hemorrhages centered in the left parietal and occipital lobes, without midline shift Fall, unclear mechanism CT Head 1/20 with multiple large acute IPH (L parietal, L occipital lobes), small volume extra-axial extension of hemorrhage, possible IV extension into L occipital horn, no midline shift. Repeat CT Head 1/21 with stable extensive L posterior hemisphere intra-axial hemorrhages with hyperdense blood products (76mL), stable small regional SAH/SDH, stable regional edema, mild mass effect without midline shift. MRI Brain with L occipital/posterior L temporal lobe hemorrhage with IV extension, L parietal lobe hemorrhage with layering blood products, ?hyperemia. - Neurology/NSGY consulted, following - Goal SBP < 160 - Labetalol  IV PRN - CTA Head pending 1/22 - Spot EEG 1/22 +cortical dysfunction/generalized cerebral dysfunction, no seizures - Empiric Keppra  500mg  BID - Trend Na closely - Hold home ASA, continue statin - Neuroprotective measures: HOB > 30 degrees, normoglycemia, normothermia, electrolytes WNL - PT/OT/SLP when able to participate in care  Diarrhea Dehydration  Nausea with vomiting - Fluid/volume resuscitation as tolerated - Monitor I&Os - Antiemetics PRN  Lactic acidosis suspect due to above Sepsis secondary to GI source vs UTI Possible acute urinary tract infection, present on arrival Patient presented febrile Tmax 102.39F, tachycardic, leukocytosis, source possible GI vs UTI COVID, Influenza, RSV negative. CXR without acute  findings. C. Diff unable to test due to solid stool, presumed negative. - Fluid resuscitation as  above - Trend WBC, fever curve, LA - F/u Cx data - Continue broad-spectrum antibiotics (ceftriaxone )  Hypokalemia Hypophosphatemia - Trend BMP - Replete electrolytes as indicated  Mild hyperglycemia - SSI - CBGs Q4H - Goal CBG 140-180  History of Vascular dementia Anxiety Depression  Ammonia 19. Home Lexapro  discontinued by husband. - Resume home Namenda  - Hold home Aricept  for now due to borderline HR  Anemia of chronic illness B12 1221, folate > 20. Iron studies WNL. - Trend H&H - Monitor for signs of active bleeding - Transfuse for Hgb < 7.0 or hemodynamically significant bleeding  GERD  - Continue Pepcid   At risk for malnutrition - Cortrak placed 1/21 - TF as appropriate, appreciate RD consult  Signature:   Corean CHRISTELLA Ilah DEVONNA [facility] 08/12/24 8:03 AM  Please see [email or web address] for pager details.  From 7A-7P if no response, please call [phone] After hours, please call ELink [phone] "

## 2024-08-12 NOTE — Plan of Care (Deleted)
 RN at the bedside. Dr. Harold is also at the bedside. Pt lying in bed, eyes closed, only briefly open one time on voice, but otherwise keep eyes closed. Nonverbal and not following commands. Moaning on pain stimulation. With forced eye opening, pt resisted eye opening. Eyes midline, seems PERRL, and not blinking to visual threat. Not tracking. No obvious facial droop. Tongue protrusion not cooperative. On pain stimulation, mild withdraw in all extremities, seems symmetrical, or slight weaker on the right UE. Sensation, coordination and gait not tested.

## 2024-08-12 NOTE — Procedures (Signed)
 Patient Name: [name]  MRN: [id number]  Epilepsy Attending: Arlin MALVA Krebs  Referring Physician/Provider: Voncile [place], MD  Date: 08/12/2024 Duration: 22.57 mins  Patient history: 84 y.o. with large L occipital IPH. EEG to evaluate for seizure  Level of alertness: Asleep/ lethargic   AEDs during EEG study: LEV  Technical aspects: This EEG study was done with scalp electrodes positioned according to the 10-20 International system of electrode placement. Electrical activity was reviewed with band pass filter of 1-70Hz , sensitivity of 7 uV/mm, display speed of 26mm/sec with a 60Hz  notched filter applied as appropriate. EEG data were recorded continuously and digitally stored.  Video monitoring was available and reviewed as appropriate.  Description: EEG showed continuous generalized and lateralized left hemisphere 3 to 6 Hz theta-delta slowing. Hyperventilation and photic stimulation were not performed.     ABNORMALITY - Continuous slow, generalized and lateralized left hemisphere  IMPRESSION: This study is suggestive of cortical dysfunction arising from left hemisphere likely secondary to underlying structural abnormality. Additionally there is generalized cerebral dysfunction (encephalopathy). No seizures or epileptiform discharges were seen throughout the recording.  Geraldin Habermehl O Kathan Kirker

## 2024-08-12 NOTE — Progress Notes (Signed)
 STROKE TEAM PROGRESS NOTE   SIGNIFICANT HOSPITAL EVENTS - Presented to the ED 1/21 after a fall. Found to be febrile with complaints of 3 days of diarrhea.  - CTH with 2 large IPHs in the left parietal and occipital lobes - Repeat CTH with stable extensive left posterior hemisphere intra-axial hemorrhages with discontinuous hyperdense blood products roughly estimated at 76mL.   INTERIM HISTORY/SUBJECTIVE No family is present today at bedside.  EEG tech and PCCM providers are present at bedside.  OBJECTIVE CBC    Component Value Date/Time   WBC 12.6 (H) 08/12/2024 0502   RBC 3.14 (L) 08/12/2024 0502   HGB 10.2 (L) 08/12/2024 0502   HCT 29.9 (L) 08/12/2024 0502   PLT 224 08/12/2024 0502   MCV 95.2 08/12/2024 0502   MCH 32.5 08/12/2024 0502   MCHC 34.1 08/12/2024 0502   RDW 13.6 08/12/2024 0502   LYMPHSABS 0.8 08/12/2024 0502   MONOABS 0.6 08/12/2024 0502   EOSABS 0.0 08/12/2024 0502   BASOSABS 0.0 08/12/2024 0502   BMET    Component Value Date/Time   NA 138 08/12/2024 0502   K 3.2 (L) 08/12/2024 0502   CL 104 08/12/2024 0502   CO2 23 08/12/2024 0502   GLUCOSE 139 (H) 08/12/2024 0502   BUN 24 (H) 08/12/2024 0502   CREATININE 0.97 08/12/2024 0502   CALCIUM 8.7 (L) 08/12/2024 0502   GFRNONAA 57 (L) 08/12/2024 0502   Lab Results  Component Value Date   HGBA1C 5.4 08/11/2024   No results found for: CHOL, HDL, LDLCALC, LDLDIRECT, TRIG, CHOLHDL  IMAGING past 24 hours MR BRAIN W WO CONTRAST Result Date: 08/11/2024 EXAM: MRI BRAIN WITH AND WITHOUT CONTRAST 08/11/2024 05:42:00 PM TECHNIQUE: Multiplanar multisequence MRI of the head/brain was performed with and without the administration of intravenous contrast. COMPARISON: Earlier same day CT head and MRI head 01/07/2023. CLINICAL HISTORY: Subdural hematoma. FINDINGS: BRAIN AND VENTRICLES: Acute intracranial hemorrhage: Irregular focus of susceptibility in the left occipital lobe extending into the posterior left  temporal lobe, measuring approximately 5.5 x 3.1 cm, similar to the recent CT. Intraventricular extension of hemorrhage into the adjacent left occipital horn with layering blood products and expansion of the occipital horn noted, similar to CT. Intraventricular blood products also extend into the atrium and posterior body of the left lateral ventricle. Additional region of hemorrhage noted in the left parietal lobe with suggestion of layering blood products at this level as well, measuring 4.9 x 2.4 cm, similar to prior. A few additional areas of hemorrhage are present in the periventricular white matter of the posterior left temporal lobe. Small areas of subdural and subarachnoid hemorrhage are again noted. On T1 images, there are areas of hyperintensity favored to reflect intrinsic T1 hyperintensity in the setting of hemorrhage. Chronic changes: Extensive T2 and FLAIR hyperintensity throughout the periventricular and subcortical white matter suggestive of chronic microvascular ischemic changes. There is generalized parenchymal volume loss. Ventricular/Mass effect: Expansion of the left occipital horn and mass effect on the atrium of the left lateral ventricle. Postcontrast findings: Postcontrast images are somewhat limited due to motion artifact. Within these limitations, there are enhancing vessels noted along the occipital and parietal lobes which may reflect hyperemia in the setting of hemorrhage. No definite vascular malformation identified. Consider CTA for further evaluation. No acute infarct. ORBITS: Bilateral lens replacement. SINUSES: No significant abnormality. BONES AND SOFT TISSUES: Normal bone marrow signal. No soft tissue abnormality. IMPRESSION: 1. Left occipital and posterior left temporal lobe hemorrhage with intraventricular extension into  the adjacent left occipital horn, atrium, and posterior body of the left lateral ventricle, similar to recent CT. 2. Left parietal lobe hemorrhage with  suggestion of layering blood products, similar to prior. 3. Enhancing vessels along the occipital and parietal lobes, possibly reflecting hyperemia in the setting of hemorrhage. No definite vascular malformation identified. Consider CTA for further evaluation. Electronically signed by: Donnice Mania MD 08/11/2024 08:23 PM EST RP Workstation: [id number]   Vitals:   08/12/24 0500 08/12/24 0600 08/12/24 0700 08/12/24 0800  BP: 122/89 (!) 112/49 (!) 112/44 (!) 120/57  Pulse: 89 73 77 76  Resp: (!) 28 (!) 22 (!) 22 17  Temp:    99.9 F (37.7 C)  TempSrc:    Axillary  SpO2: 93% 95% 95% 97%  Weight: 43.1 kg     Height:       PHYSICAL EXAM General: Acutely ill-appearing Caucasian female laying in ICU bed Psych: Unable to assess due to patient's condition CV: Regular rate and rhythm on monitor Respiratory:  Regular, unlabored respirations on room air GI: Abdomen soft and nontender  NEURO:  Mental Status: Stuporous, does not wake to voice.  Very briefly opens eyes once to voice otherwise keeps eyes closed throughout exam.  Moaning vocalizations to noxious stimuli but does not attempt to speak.  She does not follow commands or answer examiner questions.  Cranial Nerves:  II: PERRL.  Strong resistance to passive eye opening. III, IV, VI: Eyes appear midline with forced eye opening. VII: Face is symmetrical resting and with grimace VIII: Hearing intact to loud voice. IX, X: Does not allow for visualization of the soft palate XII: Does not protrude tongue to command Motor/Sensory: Withdraws left upper extremity greater than right upper extremity to noxious stimuli.  At times, appears to withdrawal left lower extremity greater than right lower extremity though both withdrawal to noxious stimuli. Coordination: Not performed Gait: Deferred  ASSESSMENT/PLAN [name] is a 84 y.o. female with history of vascular dementia, tobacco use (50 pack-year history), COPD, CKD, anemia of chronic  disease, GERD, bilateral ureteral stents, TIA presenting to the ED initially after suffering a fall with additional complaints of 3 days of diarrhea and was found to be febrile on arrival. [facility] imaging revealed two large IPHs in the left parietal and occipital lobes and patient was noted to be grossly aphasic.  ICH: Large acute ICH in the left parietal and occipital lobes with regional IVH/SAH/SDH extension, etiology: Likely traumatic versus AVM vs. CAA vs. Hemorrhagic conversion CT head 1/20: Multiple large acute ICH centered in the left parietal and occipital lobes.  Small volume of extra-axial extension of hemorrhage.  Possible IVH in the left occipital horn. No significant midline shift. Repeat CT head stable ICH roughly estimated at 76 mL.  Stable small regional SAH/SDH.  Stable regional edema and relatively mild mass effect with no midline shift.   CTA head & neck asymmetric vasculature in the superior left occipital lobe near the region of hemorrhage, suspicious for arteriovenous malformation.  Consider catheter angiography for further evaluation.  There is an arterial supply from the left PCA.  Suspected venous component appears to drain into the superior sagittal sinus.   MRI left occipital and posterior left temporal lobe hemorrhage with intraventricular extension to the adjacent left occipital horn, atrium, and posterior body of the left lateral ventricle, similar to recent CT.  Left parietal lobe hemorrhage with suggestion of layering blood products, similar to prior.  No definite vascular malformation identified.  May  consider cerebral angiogram by NSG to rule out AVM if aggressive care  2D Echo 60-65% with moderate dilation of the left and right atrial sizes  EEG encephalopathy no Sz LDL 70 HgbA1c 5.4 VTE prophylaxis - SCDs in the setting of IPH for now aspirin  81 mg daily prior to admission, now on No antithrombotic given multiple large IPH On keppra  500mg  bid Therapy recommendations:   Pending Disposition:  pending, recommend palliative care consult  BP management Home meds: None Stable BP goal < 160  Tobacco Abuse 50-pack-year smoking history      Ready to quit? N/A  Dysphagia Patient has post-stroke dysphagia, SLP consulted NPO Coretrack in place On TF @ 35  Other Stroke Risk Factors   Other Active Problems Vascular dementia On Namenda , Aricept  COPD CKD GERD Leukocytosis WBC 13.5--12.6, on rocephin    Hospital day # 2  [name], AGACNP-BC [facility] Pager: [phone]  ATTENDING NOTE: I reviewed above note and agree with the assessment and plan. Pt was seen and examined.   RN at the bedside. Dr. Harold is also at the bedside. Pt lying in bed, eyes closed, only briefly open one time on voice, but otherwise keep eyes closed. Nonverbal and not following commands. Moaning on pain stimulation. With forced eye opening, pt resisted eye opening. Eyes midline, seems PERRL, and not blinking to visual threat. Not tracking. No obvious facial droop. Tongue protrusion not cooperative. On pain stimulation, mild withdraw in all extremities, seems symmetrical, or slight weaker on the right UE. Sensation, coordination and gait not tested.  For detailed assessment and plan, please refer to above as I have made changes wherever appropriate.   Meghan Cummins, MD PhD Stroke Neurology 08/12/2024 8:36 PM  This patient is critically ill due to ICH with IVH, cerebral edema, dysarthria and leukocytosis and at significant risk of neurological worsening, death form hematoma expansion, brain herniation, aspiration and sepsis. This patient's care requires constant monitoring of vital signs, hemodynamics, respiratory and cardiac monitoring, review of multiple databases, neurological assessment, discussion with family, other specialists and medical decision making of high complexity. I spent 35 minutes of neurocritical care time in the care of this patient.    To  contact Stroke Continuity provider, please refer to [email or web address]. After hours, contact General Neurology

## 2024-08-12 NOTE — TOC Initial Note (Signed)
 Transition of Care [facility]) - Initial/Assessment Note    Patient Details  Name: [name] MRN: [id number] Date of Birth: 1941-05-13  Transition of [facility]) CM/SW Contact:    Inocente GORMAN Kindle, LCSW Phone Number: 08/12/2024, 10:08 AM  Clinical Narrative:                 Patient admitted from home with spouse and was independent prior. Workup underway for ICH and patient has cortrak and is lethargic. CSW following for medical appropriateness of SNF consult.     Barriers to Discharge: Continued Medical Work up   Patient Goals and CMS Choice            Expected Discharge Plan and Services In-house Referral: Clinical Social Work     Living arrangements for the past 2 months: Single Family Home                                      Prior Living Arrangements/Services Living arrangements for the past 2 months: Single Family Home Lives with:: Spouse Patient language and need for interpreter reviewed:: Yes Do you feel safe going back to the place where you live?: Yes      Need for Family Participation in Patient Care: Yes (Comment) Care giver support system in place?: Yes (comment) Current home services: DME (Walker, shower chair, cane, and rolling walker.) Criminal Activity/Legal Involvement Pertinent to Current Situation/Hospitalization: No - Comment as needed  Activities of Daily Living      Permission Sought/Granted Permission sought to share information with : Facility Medical Sales Representative, Family Supports Permission granted to share information with : No  Share Information with NAME: Edlyn, [place]- Spouse [phone]           Emotional Assessment Appearance:: Appears stated age Attitude/Demeanor/Rapport: Unable to Assess Affect (typically observed): Unable to Assess Orientation: :  (Disoriented x4) Alcohol  / Substance Use: Not Applicable Psych Involvement: No (comment)  Admission diagnosis:  Intracranial hemorrhage (HCC) [I62.9] ICH  (intracerebral hemorrhage) (HCC) [I61.9] Fall, initial encounter [W19.XXXA] Contusion of forehead, initial encounter [S00.83XA] Urinary tract infection without hematuria, site unspecified [N39.0] Altered mental status, unspecified altered mental status type [R41.82] Sepsis, due to unspecified organism, unspecified whether acute organ dysfunction present [facility]) [A41.9] Patient Active Problem List   Diagnosis Date Noted   Altered mental status 08/11/2024   Fall 08/11/2024   Diarrhea 08/11/2024   Malnutrition of moderate degree 08/11/2024   Traumatic left-sided intracerebral hemorrhage with unknown loss of consciousness status (HCC) 08/11/2024   Intracranial hemorrhage (HCC) 08/10/2024   Bacteremia due to Enterococcus 03/12/2024   Sepsis (HCC) 03/11/2024   Acute encephalopathy 03/11/2024   ARF (acute renal failure) 03/11/2024   Anemia 03/11/2024   Sepsis due to gram-negative urinary tract infection (HCC) 01/13/2024   Bacteremia 10/17/2023   UTI (urinary tract infection) 10/14/2023   Cerebrovascular disease 02/04/2023   Moderate vascular dementia without behavioral disturbance, psychotic disturbance, mood disturbance, or anxiety (HCC) 02/04/2023   Memory loss 12/30/2022   Unsteady gait 12/30/2022   Atopic dermatitis 02/12/2021   Gastro-esophageal reflux disease without esophagitis 02/12/2021   Gingivitis 02/12/2021   Impairment of balance 02/12/2021   Irritable bowel syndrome with diarrhea 02/12/2021   Major depressive disorder 02/12/2021   Plantar wart of right foot 02/12/2021   Urinary hesitancy 02/12/2021   Chondrodermatitis nodularis helicis of left ear 12/06/2020   Allergic urticaria 01/25/2020   Perennial allergic rhinitis 01/25/2020  Perennial allergic rhinitis 01/25/2020   Hematuria 02/22/2019   Degenerative disc disease, lumbar 04/17/2018   Degenerative scoliosis in adult patient 04/17/2018   SI joint arthritis 04/17/2018   Degenerative disc disease, cervical 09/09/2017    Spondylolisthesis of cervical region 09/09/2017   Chronic swimmer's ear of both sides 06/03/2017   Tobacco use 05/06/2017   COPD (chronic obstructive pulmonary disease) (HCC) 05/06/2017   History of renal calculi 01/14/2017   Anxiety 09/05/2016   Renal stone 09/05/2016   Hoarseness 06/20/2016   Laryngopharyngeal reflux (LPR) 06/20/2016   PCP:  Patti Mliss LABOR, FNP Pharmacy:   [facility] - [place], [place] - [address] [place] [address] Phone: [phone] Fax: [phone]  [facility] [address] [place] [address] Phone: [phone] Fax: [phone]     Social Drivers of Health (SDOH) Social History: SDOH Screenings   Food Insecurity: Patient Unable To Answer (03/12/2024)  Housing: Patient Unable To Answer (03/12/2024)  Transportation Needs: Patient Unable To Answer (03/12/2024)  Utilities: Patient Unable To Answer (03/12/2024)  Social Connections: Patient Unable To Answer (03/12/2024)  Recent Concern: Social Connections - Moderately Isolated (01/14/2024)  Tobacco Use: High Risk (06/09/2024)   Received from [facility]   SDOH Interventions:     Readmission Risk Interventions    10/20/2023   10:53 AM 10/16/2023    1:47 PM  Readmission Risk Prevention Plan  Post Dischage Appt Complete   Medication Screening Complete   Transportation Screening Complete Complete  PCP or Specialist Appt within 5-7 Days  Complete  Home Care Screening  Complete  Medication Review (RN CM)  Complete

## 2024-08-13 DIAGNOSIS — S0635AA Traumatic hemorrhage of left cerebrum with loss of consciousness status unknown, initial encounter: Secondary | ICD-10-CM | POA: Diagnosis not present

## 2024-08-13 DIAGNOSIS — I69391 Dysphagia following cerebral infarction: Secondary | ICD-10-CM | POA: Diagnosis not present

## 2024-08-13 DIAGNOSIS — F17210 Nicotine dependence, cigarettes, uncomplicated: Secondary | ICD-10-CM | POA: Diagnosis not present

## 2024-08-13 DIAGNOSIS — Q282 Arteriovenous malformation of cerebral vessels: Secondary | ICD-10-CM | POA: Diagnosis not present

## 2024-08-13 DIAGNOSIS — N390 Urinary tract infection, site not specified: Secondary | ICD-10-CM | POA: Diagnosis not present

## 2024-08-13 DIAGNOSIS — A419 Sepsis, unspecified organism: Secondary | ICD-10-CM | POA: Diagnosis not present

## 2024-08-13 DIAGNOSIS — S065XAA Traumatic subdural hemorrhage with loss of consciousness status unknown, initial encounter: Secondary | ICD-10-CM | POA: Diagnosis not present

## 2024-08-13 DIAGNOSIS — S066XAA Traumatic subarachnoid hemorrhage with loss of consciousness status unknown, initial encounter: Secondary | ICD-10-CM | POA: Diagnosis not present

## 2024-08-13 DIAGNOSIS — S0636AA Traumatic hemorrhage of cerebrum, unspecified, with loss of consciousness status unknown, initial encounter: Secondary | ICD-10-CM | POA: Diagnosis not present

## 2024-08-13 DIAGNOSIS — F0150 Vascular dementia without behavioral disturbance: Secondary | ICD-10-CM

## 2024-08-13 LAB — BASIC METABOLIC PANEL WITH GFR
Anion gap: 9 (ref 5–15)
BUN: 23 mg/dL (ref 8–23)
CO2: 25 mmol/L (ref 22–32)
Calcium: 8.4 mg/dL — ABNORMAL LOW (ref 8.9–10.3)
Chloride: 107 mmol/L (ref 98–111)
Creatinine, Ser: 0.81 mg/dL (ref 0.44–1.00)
GFR, Estimated: >60 mL/min
Glucose, Bld: 134 mg/dL — ABNORMAL HIGH (ref 70–99)
Potassium: 3.6 mmol/L (ref 3.5–5.1)
Sodium: 141 mmol/L (ref 135–145)

## 2024-08-13 LAB — GLUCOSE, CAPILLARY
Glucose-Capillary: 114 mg/dL — ABNORMAL HIGH (ref 70–99)
Glucose-Capillary: 140 mg/dL — ABNORMAL HIGH (ref 70–99)
Glucose-Capillary: 143 mg/dL — ABNORMAL HIGH (ref 70–99)
Glucose-Capillary: 162 mg/dL — ABNORMAL HIGH (ref 70–99)
Glucose-Capillary: 179 mg/dL — ABNORMAL HIGH (ref 70–99)
Glucose-Capillary: 199 mg/dL — ABNORMAL HIGH (ref 70–99)

## 2024-08-13 LAB — PHOSPHORUS: Phosphorus: 1.5 mg/dL — ABNORMAL LOW (ref 2.5–4.6)

## 2024-08-13 LAB — MAGNESIUM: Magnesium: 2 mg/dL (ref 1.7–2.4)

## 2024-08-13 MED ORDER — DONEPEZIL HCL 10 MG PO TABS
10.0000 mg | ORAL_TABLET | Freq: Every day | ORAL | Status: DC
  Administered 2024-08-13 – 2024-08-24 (×11): 10 mg
  Filled 2024-08-13 (×13): qty 1

## 2024-08-13 MED ORDER — HEPARIN SODIUM (PORCINE) 5000 UNIT/ML IJ SOLN
5000.0000 [IU] | Freq: Three times a day (TID) | INTRAMUSCULAR | Status: DC
  Administered 2024-08-13 – 2024-08-26 (×39): 5000 [IU] via SUBCUTANEOUS
  Filled 2024-08-13 (×39): qty 1

## 2024-08-13 MED ORDER — LEVETIRACETAM 500 MG PO TABS
500.0000 mg | ORAL_TABLET | Freq: Two times a day (BID) | ORAL | Status: AC
  Administered 2024-08-13 – 2024-08-18 (×11): 500 mg
  Filled 2024-08-13 (×11): qty 1

## 2024-08-13 MED ORDER — SODIUM CHLORIDE 0.9 % IV SOLN
30.0000 mmol | Freq: Once | INTRAVENOUS | Status: AC
  Administered 2024-08-13: 30 mmol via INTRAVENOUS
  Filled 2024-08-13: qty 10

## 2024-08-13 NOTE — Progress Notes (Signed)
 STROKE TEAM PROGRESS NOTE   SIGNIFICANT HOSPITAL EVENTS - Presented to the ED 1/21 after a fall. Found to be febrile with complaints of 3 days of diarrhea.  - CTH with 2 large IPHs in the left parietal and occipital lobes - Repeat CTH with stable extensive left posterior hemisphere intra-axial hemorrhages with discontinuous hyperdense blood products roughly estimated at 76mL.   INTERIM HISTORY/SUBJECTIVE No family is present today at bedside.  Pt lying in bed, slightly more awake than yesterday, slightly more opening eyes than yesterday, however, still nonverbal, not following commands, resisting forced eye opening.   OBJECTIVE CBC    Component Value Date/Time   WBC 12.6 (H) 08/12/2024 0502   RBC 3.14 (L) 08/12/2024 0502   HGB 10.2 (L) 08/12/2024 0502   HCT 29.9 (L) 08/12/2024 0502   PLT 224 08/12/2024 0502   MCV 95.2 08/12/2024 0502   MCH 32.5 08/12/2024 0502   MCHC 34.1 08/12/2024 0502   RDW 13.6 08/12/2024 0502   LYMPHSABS 0.8 08/12/2024 0502   MONOABS 0.6 08/12/2024 0502   EOSABS 0.0 08/12/2024 0502   BASOSABS 0.0 08/12/2024 0502   BMET    Component Value Date/Time   NA 141 08/13/2024 0246   K 3.6 08/13/2024 0246   CL 107 08/13/2024 0246   CO2 25 08/13/2024 0246   GLUCOSE 134 (H) 08/13/2024 0246   BUN 23 08/13/2024 0246   CREATININE 0.81 08/13/2024 0246   CALCIUM 8.4 (L) 08/13/2024 0246   GFRNONAA >60 08/13/2024 0246   Lab Results  Component Value Date   HGBA1C 5.4 08/12/2024   Lab Results  Component Value Date   CHOL 159 08/12/2024   HDL 73 08/12/2024   LDLCALC 70 08/12/2024   TRIG 77 08/12/2024   CHOLHDL 2.2 08/12/2024    IMAGING past 24 hours No results found.  Vitals:   08/13/24 1700 08/13/24 1800 08/13/24 1900 08/13/24 2000  BP: (!) 151/63 (!) 141/56 (!) 145/58   Pulse: 75 79 83   Resp: (!) 22 (!) 28 (!) 24   Temp:    99.5 F (37.5 C)  TempSrc:    Axillary  SpO2: 99% 100% 99%   Weight:      Height:       PHYSICAL EXAM General: Acutely  ill-appearing Caucasian female laying in ICU bed Psych: Unable to assess due to patient's condition CV: Regular rate and rhythm on monitor Respiratory:  Regular, unlabored respirations on room air GI: Abdomen soft and nontender  NEURO:  Eyes intermittently half way open on voice, eye midline, but otherwise keep eyes closed. Nonverbal and not following commands. Moaning on pain stimulation. Still resisted forced eye opening. Not able to check pupil, or visual field. Not tracking. No obvious facial droop. Tongue protrusion not cooperative. On pain stimulation, mild withdraw in all extremities, seems symmetrical, or slight weaker on the right UE. Sensation, coordination and gait not tested.   ASSESSMENT/PLAN [name] is a 84 y.o. female with history of vascular dementia, tobacco use (50 pack-year history), COPD, CKD, anemia of chronic disease, GERD, bilateral ureteral stents, TIA presenting to the ED initially after suffering a fall with additional complaints of 3 days of diarrhea and was found to be febrile on arrival. [facility] imaging revealed two large IPHs in the left parietal and occipital lobes and patient was noted to be grossly aphasic.  ICH: Large acute ICH in the left parietal and occipital lobes with regional IVH/SAH/SDH extension, etiology: Likely AVM vs. traumatic vs. Hemorrhagic conversion CT  head 1/20: Multiple large acute ICH centered in the left parietal and occipital lobes.  Small volume of extra-axial extension of hemorrhage.  Possible IVH in the left occipital horn. No significant midline shift. Repeat CT head stable ICH roughly estimated at 76 mL.  Stable small regional SAH/SDH.  Stable regional edema and relatively mild mass effect with no midline shift.   CTA head & neck asymmetric vasculature in the superior left occipital lobe near the region of hemorrhage, suspicious for arteriovenous malformation.  Consider catheter angiography for further evaluation.  There is an  arterial supply from the left PCA.  Suspected venous component appears to drain into the superior sagittal sinus.   MRI left occipital and posterior left temporal lobe hemorrhage with intraventricular extension to the adjacent left occipital horn, atrium, and posterior body of the left lateral ventricle, similar to recent CT.  Left parietal lobe hemorrhage with suggestion of layering blood products, similar to prior.  No definite vascular malformation identified.  May consider cerebral angiogram by NSG to rule out AVM if aggressive care  2D Echo 60-65% with moderate dilation of the left and right atrial sizes  EEG encephalopathy no Sz LDL 70 HgbA1c 5.4 VTE prophylaxis - SCDs in the setting of IPH for now aspirin  81 mg daily prior to admission, now on No antithrombotic given multiple large IPH On keppra  500mg  bid Therapy recommendations:  Pending Disposition:  pending, agree with palliative care consult  BP management Home meds: None Stable BP goal < 160  Tobacco Abuse 50-pack-year smoking history      Ready to quit? N/A  Dysphagia Patient has post-stroke dysphagia, SLP consulted NPO Coretrack in place On TF @ 35  Other Stroke Risk Factors   Other Active Problems Vascular dementia On Namenda , Aricept  COPD CKD GERD Leukocytosis WBC 13.5--12.6, on rocephin    Hospital day # 3    Ary Cummins, MD PhD Stroke Neurology 08/13/2024 9:21 PM  This patient is critically ill due to ICH with IVH, cerebral edema, dysarthria and leukocytosis and at significant risk of neurological worsening, death form hematoma expansion, brain herniation, aspiration and sepsis. This patient's care requires constant monitoring of vital signs, hemodynamics, respiratory and cardiac monitoring, review of multiple databases, neurological assessment, discussion with family, other specialists and medical decision making of high complexity. I spent 35 minutes of neurocritical care time in the care of this  patient.    To contact Stroke Continuity provider, please refer to [email or web address]. After hours, contact General Neurology

## 2024-08-13 NOTE — Progress Notes (Signed)
 SLP Cancellation Note  Patient Details Name: [name] MRN: [id number] DOB: 01/10/1941   Cancelled treatment:        Checked with RN this am who stated to defer pt due to decreased alertness and possible surgical intervention but that is now deferred. Although pt able to participate with PT (limited), eyes closed, no command following (although eating is more automatic), RN states when not stimulated she falls asleep. Pt has Cortrak will defer today and attempt tomorrow when maybe more appropriate, awake and interactive.   [name] 08/13/2024, 1:06 PM

## 2024-08-13 NOTE — Progress Notes (Signed)
" °  NEUROSURGERY PROGRESS NOTE   MRI and CTA both personally reviewed. While the CTA does not definitively reveal an AVM, there is perhaps increased vasculature along the tentorial surface of the occipital lobe on the left and certainly prominence of the Gal[place] venous system and the transverse/sigmoid sinuses despite a well timed contrast bolus suggests the presence of a A-V shunt. Nonetheless, even if there is an underlying AVM and the patient recovers to the point where treatment is reasonable, this would be done subacutely (2-3 months from now). If the patient is not transitioned to a more palliative goal of care, we could consider diagnostic angiogram later this admission assuming she begins to recover.   Gerldine Maizes, MD [facility]   "

## 2024-08-13 NOTE — Progress Notes (Signed)
 "  NAME:  [name], MRN:  [id number], DOB:  Jan 19, 1941, LOS: 3 ADMISSION DATE:  08/10/2024, CONSULTATION DATE:  08/10/2024 REFERRING MD: Gennaro - EDP, CHIEF COMPLAINT: fall and head trauma    History of Present Illness:  84 yr old woman with vascular dementia, anemia of chronic illness, GERD, bilateral ureteral stents, and prior TIA, presents for evaluation of nausea, vomiting, diarrhea for 3 days and fall tonight.  She is poor historian, does not answer questions appropriately.  History obtained from EMS.  Patient had a fall tonight in which she hit her head as well.  She is on aspirin  but no other blood thinners.  Further history limited at this time. Husband has left ED.   Pertinent Medical History:  Vascular dementia, anemia of chronic illness, GERD, bilateral ureteral stents, prior TIA  Significant Hospital Events: Including procedures, antibiotic start and stop dates in addition to other pertinent events   1/21: Admitted to PCCM  Interim History / Subjective:  NAEO. Eyes open spontaneously. Minimally responsive other than that. Withdraws to pain. Moans and groans to noxious stimuli. Palliative consulted for GOC. Nsgy with no acute interventions at this time. If she improves may proceed with dx angiogram.  Husband not at bedside.  Objective:    Blood pressure (!) 148/61, pulse 76, temperature 99.5 F (37.5 C), temperature source Axillary, resp. rate (!) 23, height 4' 10 (1.473 m), weight 43 kg, SpO2 94%.        Intake/Output Summary (Last 24 hours) at 08/13/2024 1356 Last data filed at 08/13/2024 1200 Gross per 24 hour  Intake 1362.23 ml  Output 600 ml  Net 762.23 ml   Filed Weights   08/11/24 0100 08/12/24 0500 08/13/24 0500  Weight: 42.5 kg 43.1 kg 43 kg   Physical Examination: General: Chronically ill-appearing elderly woman lying in bed in NAD. HEENT: [place]/AT, anicteric sclera, PERRL 2mm, moist mucous membranes. Neuro: Minimally responsive. Opens eyes  spontaneously. Responds to noxious stimuli (groans in pain). Not following commands. Grimaces with slight withdraw from pain in all 4 extremities.+Cough and +Gag  CV: RRR, no m/g/r. PULM: Breathing even and unlabored on RA. Lung fields CTAB. GI: Soft, nontender, nondistended. Extremities: No LE edema noted. Skin: Warm/dry, no rashes.  Resolved Problem List:  Diarrhea Dehydration  Nausea with vomiting Lactic acidosis suspect due to above Assessment and Plan:   Multiple large acute intraparenchymal hemorrhages centered in the left parietal and occipital lobes, without midline shift AVM Fall, unclear mechanism CT Head 1/20 with multiple large acute IPH (L parietal, L occipital lobes), small volume extra-axial extension of hemorrhage, possible IV extension into L occipital horn, no midline shift.  Repeat CT Head 1/21 with stable extensive L posterior hemisphere intra-axial hemorrhages with hyperdense blood products (76mL), stable small regional SAH/SDH, stable regional edema, mild mass effect without midline shift.  MRI Brain with L occipital/posterior L temporal lobe hemorrhage with IV extension, L parietal lobe hemorrhage with layering blood products, ?hyperemia. CTA w/ c/f AVM - Neurology/NSGY consulted, following - Nsgy with no acute intervention at this time; definitive treatment deferred 2-3 months if patient recovers. Dx angiogram may be considered this admission, if patient recovers. - Palliative consulted; appreciate recs - Goal SBP < 160 - Labetalol  IV PRN - Spot EEG 1/22 +cortical dysfunction/generalized cerebral dysfunction, no seizures - Empiric Keppra  500mg  BID - Trend Na closely - Hold home ASA, continue statin - Neuroprotective measures: HOB > 30 degrees, normoglycemia, normothermia, electrolytes WNL - PT/OT/SLP when able to participate in care  Diarrhea Dehydration  Nausea with vomiting - Fluid/volume resuscitation as tolerated - Monitor I&Os - Antiemetics  PRN  Sepsis secondary to GI source vs UTI Possible acute urinary tract infection, present on arrival Patient presented febrile Tmax 102.51F, tachycardic, leukocytosis, source possible GI vs UTI COVID, Influenza, RSV negative. CXR without acute findings. C. Diff unable to test due to solid stool, presumed negative. - Fluid resuscitation as above - Trend WBC, fever curve - F/u Cx data, NGTD - Empiric rocephin  discontinued  Hypokalemia Hypophosphatemia - Trend BMP - Replete electrolytes as indicated  Stress-induced hyperglycemia - SSI - CBGs Q4H - Goal CBG 140-180  History of Vascular dementia Anxiety Depression  Ammonia 19. Home Lexapro  discontinued by husband. - Resume home Namenda  and aricept   Anemia of chronic illness B12 1221, folate > 20. Iron studies WNL. - Trend H&H - Monitor for signs of active bleeding - Transfuse for Hgb < 7.0 or hemodynamically significant bleeding  GERD  - Continue Pepcid   At risk for malnutrition - Cortrak placed 1/21 - TF as appropriate, appreciate RD consult  Signature:   Warren VEAR Shade, NP [facility] 08/13/24 1:56 PM  Please see [email or web address] for pager details.  From 7A-7P if no response, please call [phone] After hours, please call ELink [phone] "

## 2024-08-13 NOTE — Progress Notes (Signed)
 Physical Therapy Treatment Patient Details Name: [name] MRN: [id number] DOB: February 02, 1941 Today's Date: 08/13/2024   History of Present Illness 84 y.o. F adm 08/11/24 with N/V/D and fall. Pt with multiple large acute intraparenchymal hemorrhages in Lt parietal and occipital lobes. PMH: vascular dementia, anemia, DDD, COPD, MDD, macular degeneration    PT Comments  Pt lethargic throughout session but not resistant to movement this time and very tolerant of LB movement, transition to sitting and even attempting to rise to feet. Pt non-verbal throughout and limited by cognition. No family present and will continue efforts to progress mobility to decrease burden of care.  Patient will benefit from continued inpatient follow up therapy, <3 hours/day    If plan is discharge home, recommend the following: A lot of help with walking and/or transfers;A lot of help with bathing/dressing/bathroom;Supervision due to cognitive status;Help with stairs or ramp for entrance;Assist for transportation;Assistance with feeding;Assistance with cooking/housework;Direct supervision/assist for medications management;Direct supervision/assist for financial management   Can travel by private vehicle     No  Equipment Recommendations  Hospital bed    Recommendations for Other Services       Precautions / Restrictions Precautions Precautions: Fall;Other (comment) Recall of Precautions/Restrictions: Impaired Precaution/Restrictions Comments: cortrak     Mobility  Bed Mobility Overal bed mobility: Needs Assistance Bed Mobility: Rolling, Sidelying to Sit, Sit to Supine Rolling: Total assist Sidelying to sit: Total assist   Sit to supine: Max assist   General bed mobility comments: total assist to roll bil for pad change, total assist to pivot to EOb and back. pt with progression to min assist sitting balance EOB but maintains eyes closed    Transfers Overall transfer level: Needs assistance    Transfers: Sit to/from Stand Sit to Stand: Max assist           General transfer comment: max assist to rise to partial standing x 2 trials    Ambulation/Gait                   Stairs             Wheelchair Mobility     Tilt Bed    Modified Rankin (Stroke Patients Only)       Balance Overall balance assessment: Needs assistance   Sitting balance-Leahy Scale: Poor Sitting balance - Comments: progression from max to min assist sitting EOB with maintained eyes closed and lack of command following                                    Communication Communication Communication: Impaired Factors Affecting Communication: Difficulty expressing self  Cognition Arousal: Lethargic Behavior During Therapy: Flat affect   PT - Cognitive impairments: Difficult to assess, No family/caregiver present to determine baseline                       PT - Cognition Comments: pt with only brief eye opening, no verbalization, no command following but sitting with assist and not resistant to movement, unaware of incontinence and wet linens Following commands: Impaired      Cueing Cueing Techniques: Verbal cues, Gestural cues, Tactile cues  Exercises General Exercises - Lower Extremity Heel Slides: AAROM, Both, 10 reps, Supine Hip ABduction/ADduction: AAROM, Both, 10 reps, Supine    General Comments        Pertinent Vitals/Pain Pain Assessment Pain Assessment: PAINAD Breathing: normal Negative  Vocalization: none Facial Expression: smiling or inexpressive Body Language: relaxed Consolability: no need to console PAINAD Score: 0 Pain Intervention(s): Repositioned    Home Living                          Prior Function            PT Goals (current goals can now be found in the care plan section) Progress towards PT goals: Not progressing toward goals - comment    Frequency    Min 1X/week      PT Plan       Co-evaluation              AM-PAC PT 6 Clicks Mobility   Outcome Measure  Help needed turning from your back to your side while in a flat bed without using bedrails?: Total Help needed moving from lying on your back to sitting on the side of a flat bed without using bedrails?: Total Help needed moving to and from a bed to a chair (including a wheelchair)?: Total Help needed standing up from a chair using your arms (e.g., wheelchair or bedside chair)?: Total Help needed to walk in hospital room?: Total Help needed climbing 3-5 steps with a railing? : Total 6 Click Score: 6    End of Session   Activity Tolerance: Patient limited by lethargy Patient left: in bed;with call bell/phone within reach;with bed alarm set;with nursing/sitter in room Nurse Communication: Mobility status PT Visit Diagnosis: Unsteadiness on feet (R26.81);Muscle weakness (generalized) (M62.81);Difficulty in walking, not elsewhere classified (R26.2)     Time: 9254-9198 PT Time Calculation (min) (ACUTE ONLY): 16 min  Charges:    $Therapeutic Activity: 8-22 mins PT General Charges $$ ACUTE PT VISIT: 1 Visit                     [name], PT Acute Rehabilitation Services Office: [phone]    [name] Laquentin Loudermilk 08/13/2024, 10:50 AM

## 2024-08-14 DIAGNOSIS — S065XAA Traumatic subdural hemorrhage with loss of consciousness status unknown, initial encounter: Secondary | ICD-10-CM | POA: Diagnosis not present

## 2024-08-14 DIAGNOSIS — S0636AA Traumatic hemorrhage of cerebrum, unspecified, with loss of consciousness status unknown, initial encounter: Secondary | ICD-10-CM | POA: Diagnosis not present

## 2024-08-14 DIAGNOSIS — S066XAA Traumatic subarachnoid hemorrhage with loss of consciousness status unknown, initial encounter: Secondary | ICD-10-CM | POA: Diagnosis not present

## 2024-08-14 DIAGNOSIS — Q282 Arteriovenous malformation of cerebral vessels: Secondary | ICD-10-CM | POA: Diagnosis not present

## 2024-08-14 DIAGNOSIS — F17210 Nicotine dependence, cigarettes, uncomplicated: Secondary | ICD-10-CM | POA: Diagnosis not present

## 2024-08-14 DIAGNOSIS — I69391 Dysphagia following cerebral infarction: Secondary | ICD-10-CM | POA: Diagnosis not present

## 2024-08-14 DIAGNOSIS — F0150 Vascular dementia without behavioral disturbance: Secondary | ICD-10-CM | POA: Diagnosis not present

## 2024-08-14 LAB — CBC
HCT: 27.8 % — ABNORMAL LOW (ref 36.0–46.0)
Hemoglobin: 9.2 g/dL — ABNORMAL LOW (ref 12.0–15.0)
MCH: 31.9 pg (ref 26.0–34.0)
MCHC: 33.1 g/dL (ref 30.0–36.0)
MCV: 96.5 fL (ref 80.0–100.0)
Platelets: 227 10*3/uL (ref 150–400)
RBC: 2.88 MIL/uL — ABNORMAL LOW (ref 3.87–5.11)
RDW: 13.8 % (ref 11.5–15.5)
WBC: 11.7 10*3/uL — ABNORMAL HIGH (ref 4.0–10.5)
nRBC: 0 % (ref 0.0–0.2)

## 2024-08-14 LAB — URINALYSIS, DIPSTICK ONLY
Bilirubin Urine: NEGATIVE
Glucose, UA: 50 mg/dL — AB
Hgb urine dipstick: NEGATIVE
Ketones, ur: NEGATIVE mg/dL
Leukocytes,Ua: NEGATIVE
Nitrite: NEGATIVE
Protein, ur: 30 mg/dL — AB
Specific Gravity, Urine: 1.016 (ref 1.005–1.030)
pH: 7 (ref 5.0–8.0)

## 2024-08-14 LAB — GLUCOSE, CAPILLARY
Glucose-Capillary: 131 mg/dL — ABNORMAL HIGH (ref 70–99)
Glucose-Capillary: 180 mg/dL — ABNORMAL HIGH (ref 70–99)
Glucose-Capillary: 141 mg/dL — ABNORMAL HIGH (ref 70–99)
Glucose-Capillary: 138 mg/dL — ABNORMAL HIGH (ref 70–99)
Glucose-Capillary: 171 mg/dL — ABNORMAL HIGH (ref 70–99)

## 2024-08-14 LAB — BASIC METABOLIC PANEL WITH GFR
Anion gap: 9 (ref 5–15)
BUN: 23 mg/dL (ref 8–23)
CO2: 25 mmol/L (ref 22–32)
Calcium: 8.6 mg/dL — ABNORMAL LOW (ref 8.9–10.3)
Chloride: 109 mmol/L (ref 98–111)
Creatinine, Ser: 0.71 mg/dL (ref 0.44–1.00)
GFR, Estimated: >60 mL/min
Glucose, Bld: 221 mg/dL — ABNORMAL HIGH (ref 70–99)
Potassium: 3.7 mmol/L (ref 3.5–5.1)
Sodium: 143 mmol/L (ref 135–145)

## 2024-08-14 LAB — MAGNESIUM: Magnesium: 2.2 mg/dL (ref 1.7–2.4)

## 2024-08-14 LAB — PHOSPHORUS: Phosphorus: 1.5 mg/dL — ABNORMAL LOW (ref 2.5–4.6)

## 2024-08-14 MED ORDER — AMLODIPINE BESYLATE 5 MG PO TABS
5.0000 mg | ORAL_TABLET | Freq: Every day | ORAL | Status: DC
  Administered 2024-08-15 – 2024-08-25 (×11): 5 mg
  Filled 2024-08-14 (×11): qty 1

## 2024-08-14 MED ORDER — POTASSIUM PHOSPHATES 15 MMOLE/5ML IV SOLN
45.0000 mmol | Freq: Once | INTRAVENOUS | Status: AC
  Administered 2024-08-14: 45 mmol via INTRAVENOUS
  Filled 2024-08-14: qty 15

## 2024-08-14 MED ORDER — AMLODIPINE BESYLATE 5 MG PO TABS
5.0000 mg | ORAL_TABLET | Freq: Every day | ORAL | Status: DC
  Administered 2024-08-14: 5 mg via ORAL
  Filled 2024-08-14: qty 1

## 2024-08-14 NOTE — Progress Notes (Signed)
 Pharmacy Electrolyte Replacement  Recent Labs:  Recent Labs    08/14/24 0844  K 3.7  MG 2.2  PHOS 1.5*  CREATININE 0.71    Low Critical Values (K </= 2.5, Phos </= 1, Mg </= 1) Present: None  MD Contacted: N/A  Plan:  IV Kphos 45mmol x1 Recheck with AM labs   Thank you for allowing pharmacy to be a part of this patients care.  Shelba Collier, PharmD, BCPS Clinical Pharmacist

## 2024-08-14 NOTE — Consult Note (Signed)
 "                                                  Palliative Care Consult Note                                  Date: 08/14/2024   Patient Name: [name]  DOB: 1941-01-27  MRN: [id number]  Age / Sex: 84 y.o., female  PCP: Patti Mliss LABOR, FNP Referring Physician: Theodoro Lakes, MD  Reason for Consultation: Establishing goals of care  Past Medical History:  Diagnosis Date   Anemia    Anxiety    Arthritis    Chronic kidney disease    COPD (chronic obstructive pulmonary disease) (HCC)    Dementia (HCC)    vascular dementia   GERD (gastroesophageal reflux disease)    History of kidney stones    Macular degeneration of left eye    Wet and gets injections q monthly   Smoker    1- 1 1/2 ppd  per Husband   Stroke (HCC)    TIAs    Subjective:   This NP Camellia Kays reviewed medical records, received report from team, assessed the patient and then meet at the patient's bedside to discuss diagnosis, prognosis, GOC, EOL wishes disposition and options.  Before meeting with the patient/family, I spent time reviewing the chart notes including PCCM note from yesterday, PT note from yesterday, neurology note from yesterday, neurosurgery note from yesterday, SLP note from yesterday, SLP note from today; later today I reviewed neurology note from today and PCCM note from today. I also reviewed vital signs, nursing flowsheets, medication administrations record, labs, and imaging.  At this point patient remains essentially obtunded, moving all extremities but nonpurposeful, not following commands.  Too lethargic for SLP evaluation.  Core track remains.  I met with the patient at bedside, while I was evaluating the patient and her husband arrived.   We meet to discuss diagnosis prognosis, GOC, EOL wishes, disposition and options. Concept of Palliative Care was introduced as specialized medical care for people and their families living with serious illness.  If focuses on providing relief from the  symptoms and stress of a serious illness.  The goal is to improve quality of life for both the patient and the family. Values and goals of care important to patient and family were attempted to be elicited.  Created space and opportunity for patient  and family to explore thoughts and feelings regarding current medical situation   Natural trajectory and current clinical status were discussed. Questions and concerns addressed. Patient  encouraged to call with questions or concerns.    Patient/Family Understanding of Illness: Her husband understands that a UTI essentially started all this.  He states that she had a stroke while waiting on EMTs to arrive.  She has not awakened yet, opened eyes briefly and moves her leg/arm but not meaningfully communicating.  He states that speech is not an issue for them as there are ways around that he knows she has bleeding on the brain and was told they think they can stop it although it may recur.  He shares that she has had recent health injuries/insults including kidney stones that were removed at [facility] long although she had returned back to  the hospital and on discharge of her readmission she was very weak.  She recently was having diarrhea but thinks is related medications and says that it seemed to improve after he stopped those.  He notes on Tuesday she felt sick, vomited, got worse so they called 911 and while waiting she slipped out of her chair and hit her head.  He said she had a stroke first and then the bleed second.  We spent a lot of time talking about the details of her clinical presentation including multiple large IPH's in the left parietal occipital lobes with out midline shift, AVM, sepsis GI versus UTI source.  Life Review: The patient and her husband have been married for 45 years, together for 47 years.  The patient's husband Lynwood has 3 children from a previous marriage but they do not have children together.  She loves watching [employer] on  TV and reading a book.  She prefers reading an actual book and not electronic ones.  He states that they are generally not religious or spiritual.  She loves animals and he talks about their 7 birdfeeders and the numerous cats they have had over the years.  Baseline Status: He notes recent hospitalizations and subsequent weakness as of late, consistent with a general decline.  Today's Discussion: In addition to discussions described above we had extensive discussion on various topics.  We had a realistic conversation about intraparenchymal hemorrhages with neurosurgery recommending no acute intervention, possible treatment in 2 to 3 months pending how she does.  They are possibly considering an angiogram, depending on how she does.  It is noted in the past 24 hours she has been a bit more awake but still not following commands and essentially nonverbal.  She has a core track and to provide nutrition because she is unable to eat/drink at this time.  We spent time talking about how she is in a very serious, sick situation.  It is not guaranteed, essentially unknown, how well she will recover and if she is will survive her hospitalization.  We talked about how if she does survive, extended recovery is unknown and the road would be very long.  She is likely to have substantial deficits and require increasing care needs.  He admits that he knows that there will be complications, and his severity of these will determine what decisions they make about her care.  He states that she has been pretty healthy until last year and since then has had decreasing energy, did not fully regain her walking prowess after her recent illnesses, worsening ambulation.  We talked about general decline and how multiple health challenges can pose increasing difficulty in recovery.  We talked about the importance of ongoing conversations, pending how she does in the coming days, to have open, honest, thoughtful discussion about how  she would want to be cared for.  I shared that they may be difficult conversations and difficult decisions to be made in the coming days around things such as how she would want to be cared for, whether she would want artificial life support including artificial feeding; whether she would want to live in a severely physically limited state potentially bedbound.  At this time they need time for outcomes to see if they can get a better understanding of how she will do in the coming days, try to get a better understanding of what her limitations will be.  I shared that palliative medicine will continue to follow.  I shared that  I would ask a coworker to keep an eye on her chart over the next couple days, but may not visit in person unless something significantly changes.  I shared that I would be back in a few days and would follow-up on Tuesday to discuss how she has done over the coming 2 to 3 days.  We would have ongoing GOC conversations pending her clinical progress, keeping her in what she would desire for her care at the center of our conversations.  I provided emotional and general support through therapeutic listening, empathy, sharing of stories, and other techniques. I answered all questions and addressed all concerns to the best of my ability.  Goals: Continue current scope of care, time for outcomes, ongoing conversations pending her clinical progress  Review of Systems  Unable to perform ROS   Objective:   Primary Diagnoses: Present on Admission: **None**   Vital Signs:  BP (!) 136/58   Pulse 81   Temp 100.2 F (37.9 C) (Axillary)   Resp (!) 9   Ht 4' 10 (1.473 m)   Wt 44.3 kg   SpO2 (!) 87%   BMI 20.41 kg/m   Physical Exam Vitals and nursing note reviewed.  Constitutional:      General: She is sleeping. She is not in acute distress. HENT:     Head: Normocephalic and atraumatic.  Cardiovascular:     Rate and Rhythm: Normal rate.  Pulmonary:     Effort: Pulmonary  effort is normal. No respiratory distress.  Abdominal:     General: Abdomen is flat. There is no distension.  Skin:    General: Skin is warm and dry.  Neurological:     Comments: Not following commands, unable to meaningfully communicate     Palliative Assessment/Data: 10%   Assessment & Plan:   HPI/Patient Profile: 84 y.o. female  with past medical history of vascular dementia, anemia of chronic illness, GERD, bilateral ureteral stents, and prior TIA, presents for evaluation of nausea, vomiting, diarrhea for 3 days and fall.  After ED evaluation she was admitted on 08/10/2024 with multiple large acute intraparenchymal hemorrhages centered of the left parietal and occipital lobes without midline shift, AVM, fall of unclear mechanism, diarrhea, dehydration, nausea/vomiting, sepsis secondary to GI versus UTI source, vascular dementia, and others.   SUMMARY OF RECOMMENDATIONS   Full code Full scope of care Time for outcomes Ongoing GOC conversations pending clinical progress Palliative medicine will follow-up Tuesday for ongoing conversations Please notify us  if significant clinical change or new palliative needs before then  Symptom Management:  Per primary team Palliative medicine is available to assist as needed  Code Status: Full Code  Prognosis:  Unable to determine  Discharge Planning:  To Be Determined   Discussed with: Patient's husband, medical team, nursing team    Thank you for allowing us  to participate in the care of ANISTEN TOMASSI PMT will continue to support holistically.  Time Total: 60 min  Detailed review of medical records (labs, imaging, vital signs), medically appropriate exam, discussed with treatment team, counseling and education to patient, family, & staff, documenting clinical information, medication management, coordination of care  Signed by: Camellia Kays, NP Palliative Medicine Team  Team Phone # [phone] (Nights/Weekends)  08/14/2024,  11:40 AM   "

## 2024-08-14 NOTE — Progress Notes (Signed)
 SLP Cancellation Note  Patient Details Name: [name] MRN: [id number] DOB: August 08, 1940   Cancelled treatment:       Reason Eval/Treat Not Completed: Patient's level of consciousness  Patient remains too lethargic to safely participate in swallowing evaluation.  ST will continue efforts.  Eleanor Eagles, MA, CCC-SLP Acute Rehab SLP [phone]  Eleanor LOISE Eagles 08/14/2024, 7:21 AM

## 2024-08-14 NOTE — Progress Notes (Addendum)
 STROKE TEAM PROGRESS NOTE   SIGNIFICANT HOSPITAL EVENTS - Presented to the ED 1/21 after a fall. Found to be febrile with complaints of 3 days of diarrhea.  - CTH with 2 large IPHs in the left parietal and occipital lobes - Repeat CTH with stable extensive left posterior hemisphere intra-axial hemorrhages with discontinuous hyperdense blood products roughly estimated at 76mL.   INTERIM HISTORY/SUBJECTIVE No family is present today at bedside.  Pt lying in bed, barely opening eyes to stimulation., still nonverbal, not following commands, resisting forced eye opening.  Neurological exam is unchanged.  Vital signs are stable. OBJECTIVE CBC    Component Value Date/Time   WBC 11.7 (H) 08/14/2024 0844   RBC 2.88 (L) 08/14/2024 0844   HGB 9.2 (L) 08/14/2024 0844   HCT 27.8 (L) 08/14/2024 0844   PLT 227 08/14/2024 0844   MCV 96.5 08/14/2024 0844   MCH 31.9 08/14/2024 0844   MCHC 33.1 08/14/2024 0844   RDW 13.8 08/14/2024 0844   LYMPHSABS 0.8 08/12/2024 0502   MONOABS 0.6 08/12/2024 0502   EOSABS 0.0 08/12/2024 0502   BASOSABS 0.0 08/12/2024 0502   BMET    Component Value Date/Time   NA 143 08/14/2024 0844   K 3.7 08/14/2024 0844   CL 109 08/14/2024 0844   CO2 25 08/14/2024 0844   GLUCOSE 221 (H) 08/14/2024 0844   BUN 23 08/14/2024 0844   CREATININE 0.71 08/14/2024 0844   CALCIUM 8.6 (L) 08/14/2024 0844   GFRNONAA >60 08/14/2024 0844   Lab Results  Component Value Date   HGBA1C 5.4 08/12/2024   Lab Results  Component Value Date   CHOL 159 08/12/2024   HDL 73 08/12/2024   LDLCALC 70 08/12/2024   TRIG 77 08/12/2024   CHOLHDL 2.2 08/12/2024    IMAGING past 24 hours No results found.  Vitals:   08/14/24 0700 08/14/24 0800 08/14/24 0900 08/14/24 1000  BP: (!) 150/60 (!) 163/63 (!) 155/61 (!) 136/58  Pulse: 87 83 79 81  Resp: (!) 25 (!) 23 (!) 23 (!) 9  Temp:  100.2 F (37.9 C)    TempSrc:  Axillary    SpO2: 98% 98% 98% (!) 87%  Weight:      Height:        PHYSICAL EXAM General: Acutely ill-appearing Caucasian female laying in ICU bed Psych: Unable to assess due to patient's condition CV: Regular rate and rhythm on monitor Respiratory:  Regular, unlabored respirations on room air GI: Abdomen soft and nontender  NEURO:  Eyes intermittently half way open on voice, eye midline, but otherwise keep eyes closed. Nonverbal and not following commands. Moaning on pain stimulation. Still resisted forced eye opening. Not able to check pupil, or visual field. Not tracking. No obvious facial droop. Tongue protrusion not cooperative. On pain stimulation, mild withdraw in all extremities, seems symmetrical, or slight weaker on the right UE. Sensation, coordination and gait not tested.   ASSESSMENT/PLAN [name] is a 84 y.o. female with history of vascular dementia, tobacco use (50 pack-year history), COPD, CKD, anemia of chronic disease, GERD, bilateral ureteral stents, TIA presenting to the ED initially after suffering a fall with additional complaints of 3 days of diarrhea and was found to be febrile on arrival. [facility] imaging revealed two large IPHs in the left parietal and occipital lobes and patient was noted to be grossly aphasic.  ICH: Large acute ICH in the left parietal and occipital lobes with regional IVH/SAH/SDH extension, etiology: Likely AVM vs. traumatic  vs. Hemorrhagic conversion CT head 1/20: Multiple large acute ICH centered in the left parietal and occipital lobes.  Small volume of extra-axial extension of hemorrhage.  Possible IVH in the left occipital horn. No significant midline shift. Repeat CT head stable ICH roughly estimated at 76 mL.  Stable small regional SAH/SDH.  Stable regional edema and relatively mild mass effect with no midline shift.   CTA head & neck asymmetric vasculature in the superior left occipital lobe near the region of hemorrhage, suspicious for arteriovenous malformation.  Consider catheter angiography for  further evaluation.  There is an arterial supply from the left PCA.  Suspected venous component appears to drain into the superior sagittal sinus.   MRI left occipital and posterior left temporal lobe hemorrhage with intraventricular extension to the adjacent left occipital horn, atrium, and posterior body of the left lateral ventricle, similar to recent CT.  Left parietal lobe hemorrhage with suggestion of layering blood products, similar to prior.  No definite vascular malformation identified.  May consider cerebral angiogram by NSG to rule out AVM if aggressive care is desired by family and patient shows meaningful improvement 2D Echo 60-65% with moderate dilation of the left and right atrial sizes  EEG encephalopathy no Sz LDL 70 HgbA1c 5.4 VTE prophylaxis - SCDs in the setting of IPH for now aspirin  81 mg daily prior to admission, now on No antithrombotic given multiple large IPH On keppra  500mg  bid Therapy recommendations:  Pending Disposition:  pending,  palliative care consult pending  BP management Home meds: None Stable BP goal < 160  Tobacco Abuse 50-pack-year smoking history      Ready to quit? N/A  Dysphagia Patient has post-stroke dysphagia, SLP consulted NPO Coretrack in place On TF @ 35  Other Stroke Risk Factors   Other Active Problems Vascular dementia On Namenda , Aricept  COPD CKD GERD Leukocytosis WBC 13.5--12.6, on rocephin    Hospital day # 4    Patient neurological exam remains poor with very slow improvement.  Palliative care consult and goals of care discussion with family is pending.  Will hold off on diagnostic angiogram at the present time unless patient shows meaningful improvement and family continues to want aggressive care..  Continue strict blood pressure control with systolic goal below 160.  No family at the bedside.  Discussed with Dr. Theodoro   I personally spent a total of 35 minutes in the care of the patient today including  getting/reviewing separately obtained history, performing a medically appropriate exam/evaluation, counseling and educating, placing orders, referring and communicating with other health care professionals, documenting clinical information in the EHR, independently interpreting results, and coordinating care.   Eather Popp, MD     To contact Stroke Continuity provider, please refer to [email or web address]. After hours, contact General Neurology

## 2024-08-14 NOTE — Progress Notes (Signed)
 Pt tx to 3W7. Report given to Tai RN. Wedding band sent in pink cup with pt.

## 2024-08-14 NOTE — Progress Notes (Addendum)
 "  NAME:  [name], MRN:  [id number], DOB:  1940-08-05, LOS: 4 ADMISSION DATE:  08/10/2024, CONSULTATION DATE:  08/10/2024 REFERRING MD: Gennaro - EDP, CHIEF COMPLAINT: fall and head trauma    History of Present Illness:  84 yr old woman with vascular dementia, anemia of chronic illness, GERD, bilateral ureteral stents, and prior TIA, presents for evaluation of nausea, vomiting, diarrhea for 3 days and fall tonight.  She is poor historian, does not answer questions appropriately.  History obtained from EMS.  Patient had a fall tonight in which she hit her head as well.  She is on aspirin  but no other blood thinners.  Further history limited at this time. Husband has left ED.   Pertinent Medical History:  Vascular dementia, anemia of chronic illness, GERD, bilateral ureteral stents, prior TIA  Significant Hospital Events: Including procedures, antibiotic start and stop dates in addition to other pertinent events   1/21: Admitted to PCCM  Interim History / Subjective:  Obtunded.  Delirious and talking nonsensical.  Moving all extremities.  Objective:    Blood pressure (!) 136/58, pulse 81, temperature 99.8 F (37.7 C), temperature source Axillary, resp. rate (!) 9, height 4' 10 (1.473 m), weight 44.3 kg, SpO2 (!) 87%.        Intake/Output Summary (Last 24 hours) at 08/14/2024 1313 Last data filed at 08/14/2024 1000 Gross per 24 hour  Intake 770 ml  Output 200 ml  Net 570 ml   Filed Weights   08/12/24 0500 08/13/24 0500 08/14/24 0422  Weight: 43.1 kg 43 kg 44.3 kg   Physical Examination: General: Elderly female who is obtunded and delirious. Lungs: clear to auscultation bilaterally.  Heart: regular rate rhythm, no murmur appreciated.  Abdomen: non tender, non distended. Normal BS.  Neuro: Obtunded and delirious.  Moving extremities and talking nonsensical.   Resolved Problem List:  Diarrhea Dehydration  Nausea with vomiting Lactic acidosis suspect due to  above Assessment and Plan:   Multiple large acute intraparenchymal hemorrhages centered in the left parietal and occipital lobes, without midline shift AVM Fall, unclear mechanism CT Head 1/20 with multiple large acute IPH (L parietal, L occipital lobes), small volume extra-axial extension of hemorrhage, possible IV extension into L occipital horn, no midline shift.  Repeat CT Head 1/21 with stable extensive L posterior hemisphere intra-axial hemorrhages with hyperdense blood products (76mL), stable small regional SAH/SDH, stable regional edema, mild mass effect without midline shift.  MRI Brain with L occipital/posterior L temporal lobe hemorrhage with IV extension, L parietal lobe hemorrhage with layering blood products, ?hyperemia. CTA w/ c/f AVM - Neurology/NSGY consulted, following - Neurosurgery following.  Definitive treatment deferred 2-3 months if patient recovers. Dx angiogram may be considered this admission, if patient recovers. - Palliative consulted; appreciate recs - Goal SBP < 160.  Started on amlodipine  5 mg daily. - Labetalol  IV PRN - Spot EEG 1/22 +cortical dysfunction/generalized cerebral dysfunction, no seizures - Empiric Keppra  500mg  BID - Trend Na closely - Hold home ASA, continue statin - Neuroprotective measures: HOB > 30 degrees, normoglycemia, normothermia, electrolytes WNL - PT/OT.  Diarrhea Dehydration  Nausea with vomiting - Antiemetics PRN  Sepsis secondary to GI source vs UTI Possible acute urinary tract infection, present on arrival Patient presented febrile Tmax 102.108F, tachycardic, leukocytosis, source possible GI vs UTI COVID, Influenza, RSV negative. CXR without acute findings. C. Diff unable to test due to solid stool, presumed negative. - Fluid resuscitation as above - Currently have low-grade fever.  Will repeat  UA.  Rocephin  was recently discontinued.  Hold off on reinitiating it.  However if fever curve trends up will start empiric  antibiotics. - F/u Cx data, NGTD  Hypokalemia Hypophosphatemia - Place as needed.  Stress-induced hyperglycemia - SSI - CBGs Q4H - Goal CBG 140-180  History of Vascular dementia Anxiety Depression  Ammonia 19. Home Lexapro  discontinued by husband. - Resume home Namenda  and aricept   Anemia of chronic illness B12 1221, folate > 20. Iron studies WNL. - Transfuse for hemoglobin less than 7.  GERD  - Continue Pepcid   At risk for malnutrition - Cortrak placed 1/21 - TF as appropriate, appreciate RD consult  Transfer the patient out of ICU today.Called to update family. Unable to reach. Did not leave a VM.   Patient Lines/Drains/Airways Status     Active Line/Drains/Airways     Name Placement date Placement time Site Days   Peripheral IV 08/10/24 18 G Left Antecubital 08/10/24  2100  Antecubital  4   Peripheral IV 08/13/24 22 G 1.75 Right;Anterior Forearm 08/13/24  1406  Forearm  1   External Urinary Catheter 08/11/24  0115  --  3   Small Bore Feeding Tube 10 Fr. Left nare Marking at nare/corner of mouth 57 cm 08/11/24  1128  Left nare  3           Total care time: 55 minutes   Care time was exclusive of separately billable procedures and treating other patients.  Care was necessary to treat or prevent imminent or life-threatening deterioration.   Care was time spent personally by me on the following activities: development of treatment plan with patient and/or surrogate as well as nursing, discussions with consultants, evaluation of patient's response to treatment, examination of patient, obtaining history from patient or surrogate, ordering and performing treatments and interventions, ordering and review of laboratory studies, ordering and review of radiographic studies and pulse oximetry.   Sammi JONETTA Fredericks, MD Pulmonary, Critical Care and Sleep Attending.   08/14/2024, 1:29 PM  "

## 2024-08-15 ENCOUNTER — Inpatient Hospital Stay (HOSPITAL_COMMUNITY)

## 2024-08-15 DIAGNOSIS — I629 Nontraumatic intracranial hemorrhage, unspecified: Secondary | ICD-10-CM | POA: Diagnosis not present

## 2024-08-15 DIAGNOSIS — S0636AA Traumatic hemorrhage of cerebrum, unspecified, with loss of consciousness status unknown, initial encounter: Secondary | ICD-10-CM | POA: Diagnosis not present

## 2024-08-15 DIAGNOSIS — S065XAA Traumatic subdural hemorrhage with loss of consciousness status unknown, initial encounter: Secondary | ICD-10-CM | POA: Diagnosis not present

## 2024-08-15 DIAGNOSIS — S066XAA Traumatic subarachnoid hemorrhage with loss of consciousness status unknown, initial encounter: Secondary | ICD-10-CM | POA: Diagnosis not present

## 2024-08-15 DIAGNOSIS — Q282 Arteriovenous malformation of cerebral vessels: Secondary | ICD-10-CM | POA: Diagnosis not present

## 2024-08-15 LAB — CULTURE, BLOOD (ROUTINE X 2): Culture: NO GROWTH

## 2024-08-15 LAB — CBC
HCT: 29.4 % — ABNORMAL LOW (ref 36.0–46.0)
Hemoglobin: 9.9 g/dL — ABNORMAL LOW (ref 12.0–15.0)
MCH: 32 pg (ref 26.0–34.0)
MCHC: 33.7 g/dL (ref 30.0–36.0)
MCV: 95.1 fL (ref 80.0–100.0)
Platelets: 234 10*3/uL (ref 150–400)
RBC: 3.09 MIL/uL — ABNORMAL LOW (ref 3.87–5.11)
RDW: 14 % (ref 11.5–15.5)
WBC: 11.3 10*3/uL — ABNORMAL HIGH (ref 4.0–10.5)
nRBC: 0 % (ref 0.0–0.2)

## 2024-08-15 LAB — GLUCOSE, CAPILLARY
Glucose-Capillary: 132 mg/dL — ABNORMAL HIGH (ref 70–99)
Glucose-Capillary: 136 mg/dL — ABNORMAL HIGH (ref 70–99)
Glucose-Capillary: 138 mg/dL — ABNORMAL HIGH (ref 70–99)
Glucose-Capillary: 150 mg/dL — ABNORMAL HIGH (ref 70–99)
Glucose-Capillary: 157 mg/dL — ABNORMAL HIGH (ref 70–99)
Glucose-Capillary: 159 mg/dL — ABNORMAL HIGH (ref 70–99)

## 2024-08-15 LAB — BASIC METABOLIC PANEL WITH GFR
Anion gap: 9 (ref 5–15)
BUN: 22 mg/dL (ref 8–23)
CO2: 28 mmol/L (ref 22–32)
Calcium: 8.4 mg/dL — ABNORMAL LOW (ref 8.9–10.3)
Chloride: 107 mmol/L (ref 98–111)
Creatinine, Ser: 0.78 mg/dL (ref 0.44–1.00)
GFR, Estimated: >60 mL/min
Glucose, Bld: 145 mg/dL — ABNORMAL HIGH (ref 70–99)
Potassium: 3.8 mmol/L (ref 3.5–5.1)
Sodium: 144 mmol/L (ref 135–145)

## 2024-08-15 LAB — MAGNESIUM: Magnesium: 2.1 mg/dL (ref 1.7–2.4)

## 2024-08-15 LAB — PHOSPHORUS: Phosphorus: 2.9 mg/dL (ref 2.5–4.6)

## 2024-08-15 MED ORDER — SODIUM CHLORIDE 0.9 % IV SOLN
2.0000 g | INTRAVENOUS | Status: AC
  Administered 2024-08-15 – 2024-08-24 (×10): 2 g via INTRAVENOUS
  Filled 2024-08-15 (×10): qty 20

## 2024-08-15 MED ORDER — METRONIDAZOLE 500 MG/100ML IV SOLN
500.0000 mg | Freq: Two times a day (BID) | INTRAVENOUS | Status: AC
  Administered 2024-08-15 – 2024-08-25 (×20): 500 mg via INTRAVENOUS
  Filled 2024-08-15 (×20): qty 100

## 2024-08-15 NOTE — Progress Notes (Signed)
 STROKE TEAM PROGRESS NOTE   SIGNIFICANT HOSPITAL EVENTS - Presented to the ED 1/21 after a fall. Found to be febrile with complaints of 3 days of diarrhea.  - CTH with 2 large IPHs in the left parietal and occipital lobes - Repeat CTH with stable extensive left posterior hemisphere intra-axial hemorrhages with discontinuous hyperdense blood products roughly estimated at 76mL.   INTERIM HISTORY/SUBJECTIVE No changes.  No family is present today at bedside.  Pt lying in bed, barely opening eyes to stimulation., still nonverbal, not following commands, resisting forced eye opening.  Neurological exam is unchanged.  Vital signs are stable. OBJECTIVE CBC    Component Value Date/Time   WBC 11.3 (H) 08/15/2024 0726   RBC 3.09 (L) 08/15/2024 0726   HGB 9.9 (L) 08/15/2024 0726   HCT 29.4 (L) 08/15/2024 0726   PLT 234 08/15/2024 0726   MCV 95.1 08/15/2024 0726   MCH 32.0 08/15/2024 0726   MCHC 33.7 08/15/2024 0726   RDW 14.0 08/15/2024 0726   LYMPHSABS 0.8 08/12/2024 0502   MONOABS 0.6 08/12/2024 0502   EOSABS 0.0 08/12/2024 0502   BASOSABS 0.0 08/12/2024 0502   BMET    Component Value Date/Time   NA 144 08/15/2024 0726   K 3.8 08/15/2024 0726   CL 107 08/15/2024 0726   CO2 28 08/15/2024 0726   GLUCOSE 145 (H) 08/15/2024 0726   BUN 22 08/15/2024 0726   CREATININE 0.78 08/15/2024 0726   CALCIUM 8.4 (L) 08/15/2024 0726   GFRNONAA >60 08/15/2024 0726   Lab Results  Component Value Date   HGBA1C 5.4 08/12/2024   Lab Results  Component Value Date   CHOL 159 08/12/2024   HDL 73 08/12/2024   LDLCALC 70 08/12/2024   TRIG 77 08/12/2024   CHOLHDL 2.2 08/12/2024    IMAGING past 24 hours DG CHEST PORT 1 VIEW Result Date: 08/15/2024 CLINICAL DATA:  Fever. EXAM: PORTABLE CHEST 1 VIEW COMPARISON:  Chest radiograph dated 08/10/2024 FINDINGS: Feeding tube extends below the diaphragm with tip beyond the inferior margin of the image. There is a small left pleural effusion and left lung  base atelectasis. Faint diffuse interstitial densities likely represent edema. Pneumonia is not excluded. No pneumothorax. Stable cardiac silhouette. Atherosclerotic calcification of the aorta. No acute osseous pathology. IMPRESSION: 1. Small left pleural effusion and left lung base atelectasis. 2. Probable mild interstitial edema. Electronically Signed   By: Vanetta Chou M.D.   On: 08/15/2024 09:06    Vitals:   08/14/24 2356 08/15/24 0342 08/15/24 0500 08/15/24 0846  BP: (!) 142/67 (!) 126/59  (!) 144/65  Pulse: 92 69    Resp: 20 20  19   Temp: 98.1 F (36.7 C) 98.4 F (36.9 C)  (!) 101 F (38.3 C)  TempSrc: Oral Oral  Oral  SpO2: 98% 96%  100%  Weight:   48.6 kg   Height:       PHYSICAL EXAM General: Acutely ill-appearing Caucasian female laying in ICU bed Psych: Unable to assess due to patient's condition CV: Regular rate and rhythm on monitor Respiratory:  Regular, unlabored respirations on room air GI: Abdomen soft and nontender  NEURO:  Eyes intermittently half way open on voice, eye midline, but otherwise keep eyes closed. Nonverbal and not following commands. Moaning on pain stimulation. Still resisted forced eye opening. Not able to check pupil, or visual field. Not tracking. No obvious facial droop. Tongue protrusion not cooperative. On pain stimulation, mild withdraw in all extremities, seems symmetrical, or slight weaker  on the right UE. Sensation, coordination and gait not tested.   ASSESSMENT/PLAN [name] is a 84 y.o. female with history of vascular dementia, tobacco use (50 pack-year history), COPD, CKD, anemia of chronic disease, GERD, bilateral ureteral stents, TIA presenting to the ED initially after suffering a fall with additional complaints of 3 days of diarrhea and was found to be febrile on arrival. [facility] imaging revealed two large IPHs in the left parietal and occipital lobes and patient was noted to be grossly aphasic.  ICH: Large acute ICH in the  left parietal and occipital lobes with regional IVH/SAH/SDH extension, etiology: Likely AVM vs. traumatic vs. Hemorrhagic conversion CT head 1/20: Multiple large acute ICH centered in the left parietal and occipital lobes.  Small volume of extra-axial extension of hemorrhage.  Possible IVH in the left occipital horn. No significant midline shift. Repeat CT head stable ICH roughly estimated at 76 mL.  Stable small regional SAH/SDH.  Stable regional edema and relatively mild mass effect with no midline shift.   CTA head & neck asymmetric vasculature in the superior left occipital lobe near the region of hemorrhage, suspicious for arteriovenous malformation.  Consider catheter angiography for further evaluation.  There is an arterial supply from the left PCA.  Suspected venous component appears to drain into the superior sagittal sinus.   MRI left occipital and posterior left temporal lobe hemorrhage with intraventricular extension to the adjacent left occipital horn, atrium, and posterior body of the left lateral ventricle, similar to recent CT.  Left parietal lobe hemorrhage with suggestion of layering blood products, similar to prior.  No definite vascular malformation identified.  May consider cerebral angiogram by NSG to rule out AVM if aggressive care is desired by family and patient shows meaningful improvement 2D Echo 60-65% with moderate dilation of the left and right atrial sizes  EEG encephalopathy no Sz LDL 70 HgbA1c 5.4 VTE prophylaxis - SCDs in the setting of IPH for now aspirin  81 mg daily prior to admission, now on No antithrombotic given multiple large IPH On keppra  500mg  bid Therapy recommendations:  Pending Disposition:  pending,  palliative care consult pending  BP management Home meds: None Stable BP goal < 160  Tobacco Abuse 50-pack-year smoking history      Ready to quit? N/A  Dysphagia Patient has post-stroke dysphagia, SLP consulted NPO Coretrack in place On TF @  35  Other Stroke Risk Factors   Other Active Problems Vascular dementia On Namenda , Aricept  COPD CKD GERD Leukocytosis WBC 13.5--12.6, on rocephin    Hospital day # 5    Patient neurological exam remains poor with very slow improvement.  Palliative care consult and goals of care discussion with family is pending.  Will hold off on diagnostic angiogram at the present time unless patient shows meaningful improvement and family continues to want aggressive care..  Continue strict blood pressure control with systolic goal below 160.  No family at the bedside.        Eather Popp, MD     To contact Stroke Continuity provider, please refer to [email or web address]. After hours, contact General Neurology

## 2024-08-15 NOTE — Plan of Care (Signed)

## 2024-08-15 NOTE — Plan of Care (Signed)
" °  Problem: Skin Integrity: Goal: Risk for impaired skin integrity will decrease Outcome: Progressing   Problem: Nutritional: Goal: Maintenance of adequate nutrition will improve Outcome: Progressing Goal: Progress toward achieving an optimal weight will improve Outcome: Progressing   Problem: Clinical Measurements: Goal: Ability to maintain clinical measurements within normal limits will improve Outcome: Progressing Goal: Will remain free from infection Outcome: Progressing Goal: Diagnostic test results will improve Outcome: Progressing Goal: Respiratory complications will improve Outcome: Progressing Goal: Cardiovascular complication will be avoided Outcome: Progressing   Problem: Activity: Goal: Risk for activity intolerance will decrease Outcome: Progressing   Problem: Nutrition: Goal: Adequate nutrition will be maintained Outcome: Progressing   Problem: Elimination: Goal: Will not experience complications related to bowel motility Outcome: Progressing Goal: Will not experience complications related to urinary retention Outcome: Progressing   Problem: Pain Managment: Goal: General experience of comfort will improve and/or be controlled Outcome: Progressing   Problem: Safety: Goal: Ability to remain free from injury will improve Outcome: Progressing   Problem: Skin Integrity: Goal: Risk for impaired skin integrity will decrease Outcome: Progressing   "

## 2024-08-15 NOTE — Progress Notes (Addendum)
 " PROGRESS NOTE    [name]  FMW:[id number] DOB: [birth year] DOA: 08/10/2024 PCP: Patti Mliss LABOR, FNP   Brief Narrative: 84 year old with past medical history significant for vascular dementia, anemia of chronic illness, GERD, bilateral ureteral stents, prior TIA presents with nausea vomiting and diarrhea for 3 days and a fall the night of admission.  It seems that patient hit her head.  She was on aspirin  prior to admission.  Patient was obtunded on admission admitted by the critical care team on 1/21st.  Patient was found to have multiple large acute intraparenchymal hemorrhage left parietal and occipital lobes.  Neurology and neurosurgery consulted.  Definitive treatment deferred 2 to 3 months if patient recovers.  Diagnostic angiogram may be consider if recovers.  Patient was also found to have diarrhea, dehydration and metabolic acidosis.  Sepsis secondary to GI source versus UTI.  Patient was transferred to [facility] starting 1/25    Assessment & Plan:   Principal Problem:   Intracranial hemorrhage (HCC) Active Problems:   Altered mental status   Fall   Diarrhea   Malnutrition of moderate degree   Traumatic left-sided intracerebral hemorrhage with unknown loss of consciousness status (HCC)   1-Multiple large acute intraparenchymal hemorrhage centered in the left parietal and occipital lobe, SAH/SDH extension. ;  Could be related to AVM versus traumatic versus hemorrhagic conversion. AVM Fall unclear mechanism -CT head 1/20: Multiple large acute IPH left parietal lobe and left occipital lobe, small volume extra axial  extension of hemorrhage.  Possible Intraventricular  extension into left occipital horn. - Repeated CT head 1/21st: Stable extensive left posterior hemisphere intra-axial hemorrhage, stable small regional subarachnoid/subdural space extension of blood.  - MRI brain: Left occipital posterior left temporal lobe hemorrhage, left parietal lobe hemorrhage with layering  blood product hyperemia. - CTA consistent with AVM - Neurosurgery following, definitive treatment deferred 2 to 3 months if patient recovers. - Continue with Keppra  -Spot EEG 1/20-second cortical dysfunction generalized cerebral dysfunction no seizures. -LDL 70 A1c 5.4 echo ejection fraction 65%, moderate dilation of the left and right atrial size. -Was on aspirin  prior to admission now on not antithrombotic given multiple large IPH Still lethargic.   Diarrhea Dehydration Nausea vomiting: -GI pathogen negative   Sepsis secondary to GI source versus UTI: Possible UTI present on arrival - Patient with fever, tachycardia, leukocytosis, source possible GI versus UTI. - Chest x-ray without acute finding.  C. difficile unable to test due to solid stool, COVID influenza RSV negative. -CT abdomen and pelvis diffuse colon wall thickening and area of the small bowel wall thickening as may be seen in enterocolitis of infectious or inflammatory etiology.  Possible esophagitis.  Slightly thick walled urinary bladder. - Completed ceftriaxone . - Still having low-grade fever repeat UA negative.  Will repeat chest x-ray - Urine culture no growth today, blood culture no growth today, respiratory panel completed negative. Still having low grade fever. Will resume antibiotics. Adde flagyl  to cover for colitis  Hypokalemia Hypophosphatemia - Replace as needed  Stress-induced hyperglycemia: Continue sliding scale insulin   History of vascular dementia Anxiety Depression - Ammonia 19, Lexapro  was discontinued by husband - Continue Namenda  and Aricept    Anemia of chronic illness: B12: 1200, folate more than 20, iron within normal limit Transfuse for hemoglobin less than 7  GERD: -Continue Pepcid   Risk for malnutrition Dysphagia Core trak Place 1/21st On tube feeding       Nutrition Problem: Moderate Malnutrition Etiology: social / environmental circumstances    Signs/Symptoms:  mild fat depletion, moderate muscle depletion    Interventions: Refer to RD note for recommendations, Tube feeding  Estimated body mass index is 22.39 kg/m as calculated from the following:   Height as of this encounter: 4' 10 (1.473 m).   Weight as of this encounter: 48.6 kg.   DVT prophylaxis:heparin    Code Status: Discussed with Husband, in this circumstances Mrs. Odetta would not want to be on life support or would not want heroic  measures like chest compression, defibrillation, or mechanical intubation.  I have updated her CODE STATUS to DNR/DNI.  Plan to continue with current management and reassess her condition in 24 to 48 hours. Family Communication:Husband updated over phone 1/25 Disposition Plan:  Status is: Inpatient Remains inpatient appropriate because: management of ICH    Consultants:  Neurology Neurosurgery CCM  Procedures:  none  Antimicrobials:    Subjective: Lethargic, doe not follows command, does not open eyes.    Objective: Vitals:   08/14/24 1943 08/14/24 2356 08/15/24 0342 08/15/24 0500  BP: (!) 144/64 (!) 142/67 (!) 126/59   Pulse: 94 92 69   Resp: 19 20 20    Temp: 99.9 F (37.7 C) 98.1 F (36.7 C) 98.4 F (36.9 C)   TempSrc: Oral Oral Oral   SpO2: 93% 98% 96%   Weight:    48.6 kg  Height:        Intake/Output Summary (Last 24 hours) at 08/15/2024 0648 Last data filed at 08/15/2024 0146 Gross per 24 hour  Intake 982.62 ml  Output 1000 ml  Net -17.38 ml   Filed Weights   08/13/24 0500 08/14/24 0422 08/15/24 0500  Weight: 43 kg 44.3 kg 48.6 kg    Examination:  General exam: Appears calm and comfortable  Respiratory system: Clear to auscultation. Respiratory effort normal. Cardiovascular system: S1 & S2 heard, RRR. No JVD, murmurs, rubs, gallops or clicks. No pedal edema. Gastrointestinal system: Abdomen is nondistended, soft and nontender. No organomegaly or masses felt. Normal bowel sounds heard. Central nervous system:  lethargic  Data Reviewed: I have personally reviewed following labs and imaging studies  CBC: Recent Labs  Lab 08/10/24 2135 08/10/24 2148 08/11/24 0605 08/12/24 0502 08/14/24 0844  WBC 15.4*  --  13.5* 12.6* 11.7*  NEUTROABS 13.6*  --   --  11.0*  --   HGB 10.2* 9.9* 10.3* 10.2* 9.2*  HCT 30.6* 29.0* 29.9* 29.9* 27.8*  MCV 95.6  --  94.9 95.2 96.5  PLT 268  --  246 224 227   Basic Metabolic Panel: Recent Labs  Lab 08/10/24 2135 08/10/24 2148 08/11/24 0605 08/12/24 0502 08/13/24 0246 08/14/24 0844  NA 139 139 137 138 141 143  K 4.0 3.8 3.8 3.2* 3.6 3.7  CL 99 100 99 104 107 109  CO2 28  --  26 23 25 25   GLUCOSE 130* 128* 91 139* 134* 221*  BUN 19 19 17  24* 23 23  CREATININE 0.98 1.10* 1.00 0.97 0.81 0.71  CALCIUM 9.8  --  9.1 8.7* 8.4* 8.6*  MG  --   --  1.7 2.0 2.0 2.2  PHOS  --   --  3.4 2.4* 1.5* 1.5*   GFR: Estimated Creatinine Clearance: 33.8 mL/min (by C-G formula based on SCr of 0.71 mg/dL). Liver Function Tests: Recent Labs  Lab 08/11/24 0605  AST 30  ALT 11  ALKPHOS 54  BILITOT 0.3  PROT 6.6  ALBUMIN 3.8   No results for input(s): LIPASE, AMYLASE in the last 168 hours.  Recent Labs  Lab 08/11/24 1045  AMMONIA 19   Coagulation Profile: Recent Labs  Lab 08/11/24 0014  INR 1.1   Cardiac Enzymes: No results for input(s): CKTOTAL, CKMB, CKMBINDEX, TROPONINI in the last 168 hours. BNP (last 3 results) No results for input(s): PROBNP in the last 8760 hours. HbA1C: Recent Labs    08/12/24 1443  HGBA1C 5.4   CBG: Recent Labs  Lab 08/14/24 1127 08/14/24 1523 08/14/24 2044 08/15/24 0023 08/15/24 0334  GLUCAP 141* 138* 171* 157* 136*   Lipid Profile: Recent Labs    08/12/24 1443  CHOL 159  HDL 73  LDLCALC 70  TRIG 77  CHOLHDL 2.2   Thyroid  Function Tests: No results for input(s): TSH, T4TOTAL, FREET4, T3FREE, THYROIDAB in the last 72 hours. Anemia Panel: No results for input(s): VITAMINB12,  FOLATE, FERRITIN, TIBC, IRON, RETICCTPCT in the last 72 hours. Sepsis Labs: Recent Labs  Lab 08/10/24 2155 08/11/24 0028  LATICACIDVEN 2.0* 1.9    Recent Results (from the past 240 hours)  Culture, blood (routine x 2)     Status: None (Preliminary result)   Collection Time: 08/10/24  9:10 PM   Specimen: BLOOD  Result Value Ref Range Status   Specimen Description BLOOD RIGHT ANTECUBITAL  Final   Special Requests   Final    BOTTLES DRAWN AEROBIC AND ANAEROBIC Blood Culture results may not be optimal due to an inadequate volume of blood received in culture bottles   Culture   Final    NO GROWTH 4 DAYS Performed at [facility], [address], [place], [place] [address]    Report Status PENDING  Incomplete  Resp panel by RT-PCR (RSV, Flu A&B, Covid) Anterior Nasal Swab     Status: None   Collection Time: 08/10/24  9:11 PM   Specimen: Anterior Nasal Swab  Result Value Ref Range Status   SARS Coronavirus 2 by RT PCR NEGATIVE NEGATIVE Final   Influenza A by PCR NEGATIVE NEGATIVE Final   Influenza B by PCR NEGATIVE NEGATIVE Final    Comment: (NOTE) The Xpert Xpress SARS-CoV-2/FLU/RSV plus assay is intended as an aid in the diagnosis of influenza from Nasopharyngeal swab specimens and should not be used as a sole basis for treatment. Nasal washings and aspirates are unacceptable for Xpert Xpress SARS-CoV-2/FLU/RSV testing.  Fact Sheet for Patients: bloggercourse.com  Fact Sheet for Healthcare Providers: seriousbroker.it  This test is not yet approved or cleared by the [place]  FDA and has been authorized for detection and/or diagnosis of SARS-CoV-2 by FDA under an Emergency Use Authorization (EUA). This EUA will remain in effect (meaning this test can be used) for the duration of the COVID-19 declaration under Section 564(b)(1) of the Act, 21 U.S.C. section 360bbb-3(b)(1), unless the authorization is  terminated or revoked.     Resp Syncytial Virus by PCR NEGATIVE NEGATIVE Final    Comment: (NOTE) Fact Sheet for Patients: bloggercourse.com  Fact Sheet for Healthcare Providers: seriousbroker.it  This test is not yet approved or cleared by the [place]  FDA and has been authorized for detection and/or diagnosis of SARS-CoV-2 by FDA under an Emergency Use Authorization (EUA). This EUA will remain in effect (meaning this test can be used) for the duration of the COVID-19 declaration under Section 564(b)(1) of the Act, 21 U.S.C. section 360bbb-3(b)(1), unless the authorization is terminated or revoked.  Performed at [facility], [address], [place], [place] [address]   MRSA Next Gen by PCR, Nasal  Status: None   Collection Time: 08/11/24  1:01 AM   Specimen: Nasal Mucosa; Nasal Swab  Result Value Ref Range Status   MRSA by PCR Next Gen NOT DETECTED NOT DETECTED Final    Comment: (NOTE) The GeneXpert MRSA Assay (FDA approved for NASAL specimens only), is one component of a comprehensive MRSA colonization surveillance program. It is not intended to diagnose MRSA infection nor to guide or monitor treatment for MRSA infections. Test performance is not FDA approved in patients less than 47 years old. Performed at [facility], [address] [place], [place] [address]   Gastrointestinal Panel by PCR , Stool     Status: None   Collection Time: 08/11/24  7:31 AM   Specimen: STOOL  Result Value Ref Range Status   Campylobacter species NOT DETECTED NOT DETECTED Final   Plesimonas shigelloides NOT DETECTED NOT DETECTED Final   Salmonella species NOT DETECTED NOT DETECTED Final   Yersinia enterocolitica NOT DETECTED NOT DETECTED Final   Vibrio species NOT DETECTED NOT DETECTED Final   Vibrio cholerae NOT DETECTED NOT DETECTED Final   Enteroaggregative E coli (EAEC) NOT DETECTED NOT DETECTED Final    Enteropathogenic E coli (EPEC) NOT DETECTED NOT DETECTED Final   Enterotoxigenic E coli (ETEC) NOT DETECTED NOT DETECTED Final   Shiga like toxin producing E coli (STEC) NOT DETECTED NOT DETECTED Final   Shigella/Enteroinvasive E coli (EIEC) NOT DETECTED NOT DETECTED Final   Cryptosporidium NOT DETECTED NOT DETECTED Final   Cyclospora cayetanensis NOT DETECTED NOT DETECTED Final   Entamoeba histolytica NOT DETECTED NOT DETECTED Final   Giardia lamblia NOT DETECTED NOT DETECTED Final   Adenovirus F40/41 NOT DETECTED NOT DETECTED Final   Astrovirus NOT DETECTED NOT DETECTED Final   Norovirus GI/GII NOT DETECTED NOT DETECTED Final   Rotavirus A NOT DETECTED NOT DETECTED Final   Sapovirus (I, II, IV, and V) NOT DETECTED NOT DETECTED Final    Comment: Performed at [facility], [address] [place], [place] [address]  Respiratory (~20 pathogens) panel by PCR     Status: None   Collection Time: 08/11/24  9:14 AM   Specimen: Nasopharyngeal Swab; Respiratory  Result Value Ref Range Status   Adenovirus NOT DETECTED NOT DETECTED Final   Coronavirus 229E NOT DETECTED NOT DETECTED Final    Comment: (NOTE) The Coronavirus on the Respiratory Panel, DOES NOT test for the novel  Coronavirus (2019 nCoV)    Coronavirus HKU1 NOT DETECTED NOT DETECTED Final   Coronavirus NL63 NOT DETECTED NOT DETECTED Final   Coronavirus OC43 NOT DETECTED NOT DETECTED Final   Metapneumovirus NOT DETECTED NOT DETECTED Final   Rhinovirus / Enterovirus NOT DETECTED NOT DETECTED Final   Influenza A NOT DETECTED NOT DETECTED Final   Influenza B NOT DETECTED NOT DETECTED Final   Parainfluenza Virus 1 NOT DETECTED NOT DETECTED Final   Parainfluenza Virus 2 NOT DETECTED NOT DETECTED Final   Parainfluenza Virus 3 NOT DETECTED NOT DETECTED Final   Parainfluenza Virus 4 NOT DETECTED NOT DETECTED Final   Respiratory Syncytial Virus NOT DETECTED NOT DETECTED Final   Bordetella pertussis NOT DETECTED NOT DETECTED  Final   Bordetella Parapertussis NOT DETECTED NOT DETECTED Final   Chlamydophila pneumoniae NOT DETECTED NOT DETECTED Final   Mycoplasma pneumoniae NOT DETECTED NOT DETECTED Final    Comment: Performed at [facility], [address], [place], [place] [address]  Urine Culture (for pregnant, neutropenic or urologic patients or patients with an indwelling  urinary catheter)     Status: None   Collection Time: 08/11/24  9:46 AM   Specimen: Urine, Clean Catch  Result Value Ref Range Status   Specimen Description URINE, CLEAN CATCH  Final   Special Requests NONE  Final   Culture   Final    NO GROWTH Performed at [facility], [address], [place], [place] [address]    Report Status 08/12/2024 FINAL  Final  Culture, blood (routine x 2)     Status: None (Preliminary result)   Collection Time: 08/11/24 10:49 AM   Specimen: BLOOD LEFT ARM  Result Value Ref Range Status   Specimen Description BLOOD LEFT ARM  Final   Special Requests   Final    BOTTLES DRAWN AEROBIC ONLY Blood Culture results may not be optimal due to an inadequate volume of blood received in culture bottles   Culture   Final    NO GROWTH 3 DAYS Performed at [facility], [address], [place], [place] [address]    Report Status PENDING  Incomplete         Radiology Studies: No results found.      Scheduled Meds:  amLODipine   5 mg Per Tube Daily   Chlorhexidine  Gluconate Cloth  6 each Topical Daily   donepezil   10 mg Per Tube QHS   feeding supplement (PROSource TF20)  60 mL Per Tube Daily   heparin  injection (subcutaneous)  5,000 Units Subcutaneous Q8H   insulin  aspart  0-9 Units Subcutaneous Q4H   levETIRAcetam   500 mg Per Tube BID   memantine   5 mg Per Tube BID   multivitamin with minerals  1 tablet Per Tube Daily   mouth rinse  15 mL Mouth Rinse 4 times per day   pantoprazole  (PROTONIX ) IV  40 mg Intravenous Q24H   thiamine   100 mg Per Tube Daily   Continuous Infusions:  feeding  supplement (OSMOLITE 1.5 CAL) 35 mL/hr at 08/15/24 0123     LOS: 5 days    Time spent: 35 Minutes    Savva Beamer A Clevester Helzer, MD [facility]   If 7PM-7AM, please contact night-coverage [email or web address]  08/15/2024, 6:48 AM   "

## 2024-08-16 DIAGNOSIS — S0636AA Traumatic hemorrhage of cerebrum, unspecified, with loss of consciousness status unknown, initial encounter: Secondary | ICD-10-CM | POA: Diagnosis not present

## 2024-08-16 DIAGNOSIS — S065XAA Traumatic subdural hemorrhage with loss of consciousness status unknown, initial encounter: Secondary | ICD-10-CM | POA: Diagnosis not present

## 2024-08-16 DIAGNOSIS — S066XAA Traumatic subarachnoid hemorrhage with loss of consciousness status unknown, initial encounter: Secondary | ICD-10-CM | POA: Diagnosis not present

## 2024-08-16 DIAGNOSIS — I629 Nontraumatic intracranial hemorrhage, unspecified: Secondary | ICD-10-CM | POA: Diagnosis not present

## 2024-08-16 DIAGNOSIS — Q282 Arteriovenous malformation of cerebral vessels: Secondary | ICD-10-CM | POA: Diagnosis not present

## 2024-08-16 LAB — GLUCOSE, CAPILLARY
Glucose-Capillary: 132 mg/dL — ABNORMAL HIGH (ref 70–99)
Glucose-Capillary: 133 mg/dL — ABNORMAL HIGH (ref 70–99)
Glucose-Capillary: 137 mg/dL — ABNORMAL HIGH (ref 70–99)
Glucose-Capillary: 138 mg/dL — ABNORMAL HIGH (ref 70–99)
Glucose-Capillary: 139 mg/dL — ABNORMAL HIGH (ref 70–99)
Glucose-Capillary: 146 mg/dL — ABNORMAL HIGH (ref 70–99)

## 2024-08-16 LAB — CULTURE, BLOOD (ROUTINE X 2): Culture: NO GROWTH

## 2024-08-16 LAB — BASIC METABOLIC PANEL WITH GFR
Anion gap: 8 (ref 5–15)
BUN: 26 mg/dL — ABNORMAL HIGH (ref 8–23)
CO2: 28 mmol/L (ref 22–32)
Calcium: 8.4 mg/dL — ABNORMAL LOW (ref 8.9–10.3)
Chloride: 109 mmol/L (ref 98–111)
Creatinine, Ser: 0.73 mg/dL (ref 0.44–1.00)
GFR, Estimated: >60 mL/min
Glucose, Bld: 130 mg/dL — ABNORMAL HIGH (ref 70–99)
Potassium: 3.6 mmol/L (ref 3.5–5.1)
Sodium: 145 mmol/L (ref 135–145)

## 2024-08-16 NOTE — Plan of Care (Signed)
  Problem: Skin Integrity: Goal: Risk for impaired skin integrity will decrease Outcome: Progressing   Problem: Nutritional: Goal: Maintenance of adequate nutrition will improve Outcome: Progressing Goal: Progress toward achieving an optimal weight will improve Outcome: Progressing   Problem: Clinical Measurements: Goal: Ability to maintain clinical measurements within normal limits will improve Outcome: Progressing Goal: Will remain free from infection Outcome: Progressing Goal: Diagnostic test results will improve Outcome: Progressing Goal: Respiratory complications will improve Outcome: Progressing Goal: Cardiovascular complication will be avoided Outcome: Progressing   Problem: Elimination: Goal: Will not experience complications related to bowel motility Outcome: Progressing Goal: Will not experience complications related to urinary retention Outcome: Progressing   Problem: Pain Managment: Goal: General experience of comfort will improve and/or be controlled Outcome: Progressing   Problem: Safety: Goal: Ability to remain free from injury will improve Outcome: Progressing   Problem: Skin Integrity: Goal: Risk for impaired skin integrity will decrease Outcome: Progressing

## 2024-08-16 NOTE — Progress Notes (Signed)
 STROKE TEAM PROGRESS NOTE   SIGNIFICANT HOSPITAL EVENTS - Presented to the ED 1/21 after a fall. Found to be febrile with complaints of 3 days of diarrhea.  - CTH with 2 large IPHs in the left parietal and occipital lobes - Repeat CTH with stable extensive left posterior hemisphere intra-axial hemorrhages with discontinuous hyperdense blood products roughly estimated at 76mL.   INTERIM HISTORY/SUBJECTIVE No family is present today at bedside. Neurological exam is unchanged. Pt lying in bed, barely opening eyes to stimulation., still nonverbal, not following commands, resisting forced eye opening.    Vital signs are stable. OBJECTIVE CBC    Component Value Date/Time   WBC 11.3 (H) 08/15/2024 0726   RBC 3.09 (L) 08/15/2024 0726   HGB 9.9 (L) 08/15/2024 0726   HCT 29.4 (L) 08/15/2024 0726   PLT 234 08/15/2024 0726   MCV 95.1 08/15/2024 0726   MCH 32.0 08/15/2024 0726   MCHC 33.7 08/15/2024 0726   RDW 14.0 08/15/2024 0726   LYMPHSABS 0.8 08/12/2024 0502   MONOABS 0.6 08/12/2024 0502   EOSABS 0.0 08/12/2024 0502   BASOSABS 0.0 08/12/2024 0502   BMET    Component Value Date/Time   NA 145 08/16/2024 0144   K 3.6 08/16/2024 0144   CL 109 08/16/2024 0144   CO2 28 08/16/2024 0144   GLUCOSE 130 (H) 08/16/2024 0144   BUN 26 (H) 08/16/2024 0144   CREATININE 0.73 08/16/2024 0144   CALCIUM 8.4 (L) 08/16/2024 0144   GFRNONAA >60 08/16/2024 0144   Lab Results  Component Value Date   HGBA1C 5.4 08/12/2024   Lab Results  Component Value Date   CHOL 159 08/12/2024   HDL 73 08/12/2024   LDLCALC 70 08/12/2024   TRIG 77 08/12/2024   CHOLHDL 2.2 08/12/2024    IMAGING past 24 hours No results found.  Vitals:   08/15/24 2340 08/16/24 0406 08/16/24 0425 08/16/24 0800  BP: (!) 130/54 (!) 125/52  (!) 140/64  Pulse: 92 87  86  Resp: (!) 22 (!) 21    Temp: 99.7 F (37.6 C) 99.1 F (37.3 C)  98.6 F (37 C)  TempSrc: Oral Oral  Axillary  SpO2: 99% 98%    Weight:   48.7 kg    Height:       PHYSICAL EXAM General: Acutely ill-appearing Caucasian female laying in ICU bed Psych: Unable to assess due to patient's condition CV: Regular rate and rhythm on monitor Respiratory:  Regular, unlabored respirations on room air GI: Abdomen soft and nontender  NEURO:  Eyes intermittently half way open on voice, eye midline, but otherwise keep eyes closed. Nonverbal and not following commands. Moaning on pain stimulation. Still resisted forced eye opening. Not able to check pupil, or visual field. Not tracking. No obvious facial droop. Tongue protrusion not cooperative. On pain stimulation, mild withdraw in all extremities, seems symmetrical, or slight weaker on the right UE. Sensation, coordination and gait not tested.   ASSESSMENT/PLAN [name] is a 84 y.o. female with history of vascular dementia, tobacco use (50 pack-year history), COPD, CKD, anemia of chronic disease, GERD, bilateral ureteral stents, TIA presenting to the ED initially after suffering a fall with additional complaints of 3 days of diarrhea and was found to be febrile on arrival. [facility] imaging revealed two large IPHs in the left parietal and occipital lobes and patient was noted to be grossly aphasic.  ICH: Large acute ICH in the left parietal and occipital lobes with regional IVH/SAH/SDH extension, etiology:  Likely AVM vs. traumatic vs. Hemorrhagic conversion CT head 1/20: Multiple large acute ICH centered in the left parietal and occipital lobes.  Small volume of extra-axial extension of hemorrhage.  Possible IVH in the left occipital horn. No significant midline shift. Repeat CT head stable ICH roughly estimated at 76 mL.  Stable small regional SAH/SDH.  Stable regional edema and relatively mild mass effect with no midline shift.   CTA head & neck asymmetric vasculature in the superior left occipital lobe near the region of hemorrhage, suspicious for arteriovenous malformation.  Consider catheter  angiography for further evaluation.  There is an arterial supply from the left PCA.  Suspected venous component appears to drain into the superior sagittal sinus.   MRI left occipital and posterior left temporal lobe hemorrhage with intraventricular extension to the adjacent left occipital horn, atrium, and posterior body of the left lateral ventricle, similar to recent CT.  Left parietal lobe hemorrhage with suggestion of layering blood products, similar to prior.  No definite vascular malformation identified.  May consider cerebral angiogram by NSG to rule out AVM if aggressive care is desired by family and patient shows meaningful improvement 2D Echo 60-65% with moderate dilation of the left and right atrial sizes  EEG encephalopathy no Sz LDL 70 HgbA1c 5.4 VTE prophylaxis - SCDs in the setting of IPH for now aspirin  81 mg daily prior to admission, now on No antithrombotic given multiple large IPH On keppra  500mg  bid Therapy recommendations:  Pending Disposition:  pending,  palliative care consult pending  BP management Home meds: None Stable BP goal < 160  Tobacco Abuse 50-pack-year smoking history      Ready to quit? N/A  Dysphagia Patient has post-stroke dysphagia, SLP consulted NPO Coretrack in place On TF @ 35  Other Stroke Risk Factors   Other Active Problems Vascular dementia On Namenda , Aricept  COPD CKD GERD Leukocytosis WBC 13.5--12.6, on rocephin    Hospital day # 6    Patient neurological exam remains poor with very slow improvement.  Palliative care consult and goals of care discussion with family are ongoing will hold off on diagnostic angiogram at the present time unless patient shows meaningful improvement and family continues to want aggressive care..  Continue strict blood pressure control with systolic goal below 160.  No family at the bedside.  Discussed with Dr. Terie Eather Popp, MD     To contact Stroke Continuity provider, please  refer to [email or web address]. After hours, contact General Neurology

## 2024-08-16 NOTE — TOC Progression Note (Signed)
 Transition of Care [facility]) - Progression Note    Patient Details  Name: [name] MRN: [id number] Date of Birth: Dec 17, 1940  [facility]) CM/SW Contact  Luise JAYSON Pan, [place] Phone Number: 08/16/2024, 9:21 AM  Clinical Narrative:   CSW following for medical appropriateness of SNF consult. Patient still has cortrak.    Barriers to Discharge: Continued Medical Work up               Expected Discharge Plan and Services In-house Referral: Clinical Social Work     Living arrangements for the past 2 months: Single Family Home                                       Social Drivers of Health (SDOH) Interventions SDOH Screenings   Food Insecurity: Patient Unable To Answer (03/12/2024)  Housing: Patient Unable To Answer (03/12/2024)  Transportation Needs: Patient Unable To Answer (03/12/2024)  Utilities: Patient Unable To Answer (03/12/2024)  Social Connections: Patient Unable To Answer (03/12/2024)  Recent Concern: Social Connections - Moderately Isolated (01/14/2024)  Tobacco Use: High Risk (06/09/2024)   Received from [facility]    Readmission Risk Interventions    10/20/2023   10:53 AM 10/16/2023    1:47 PM  Readmission Risk Prevention Plan  Post Dischage Appt Complete   Medication Screening Complete   Transportation Screening Complete Complete  PCP or Specialist Appt within 5-7 Days  Complete  Home Care Screening  Complete  Medication Review (RN CM)  Complete

## 2024-08-16 NOTE — Progress Notes (Signed)
 " PROGRESS NOTE    [name]  FMW:[id number] DOB: [birth year] DOA: 08/10/2024 PCP: Patti Mliss LABOR, FNP   Brief Narrative: 84 year old with past medical history significant for vascular dementia, anemia of chronic illness, GERD, bilateral ureteral stents, prior TIA presents with nausea vomiting and diarrhea for 3 days and a fall the night of admission.  It seems that patient hit her head.  She was on aspirin  prior to admission.  Patient was obtunded on admission admitted by the critical care team on 1/21st.  Patient was found to have multiple large acute intraparenchymal hemorrhage left parietal and occipital lobes.  Neurology and neurosurgery consulted.  Definitive treatment deferred 2 to 3 months if patient recovers.  Diagnostic angiogram may be consider if recovers.  Patient was also found to have diarrhea, dehydration and metabolic acidosis.  Sepsis secondary to GI source versus UTI.  Patient was transferred to [facility] starting 1/25    Assessment & Plan:   Principal Problem:   Intracranial hemorrhage (HCC) Active Problems:   Altered mental status   Fall   Diarrhea   Malnutrition of moderate degree   Traumatic left-sided intracerebral hemorrhage with unknown loss of consciousness status (HCC)   1-Multiple large acute intraparenchymal hemorrhage centered in the left parietal and occipital lobe, SAH/SDH extension. ;  Could be related to AVM versus traumatic versus hemorrhagic conversion. AVM Fall unclear mechanism -CT head 1/20: Multiple large acute IPH left parietal lobe and left occipital lobe, small volume extra axial  extension of hemorrhage.  Possible Intraventricular  extension into left occipital horn. - Repeated CT head 1/21st: Stable extensive left posterior hemisphere intra-axial hemorrhage, stable small regional subarachnoid/subdural space extension of blood.  - MRI brain: Left occipital posterior left temporal lobe hemorrhage, left parietal lobe hemorrhage with layering  blood product hyperemia. - CTA consistent with AVM - Neurosurgery following, definitive treatment deferred 2 to 3 months if patient recovers. - Continue with Keppra  -Spot EEG 1/20-second cortical dysfunction generalized cerebral dysfunction no seizures. -LDL 70 A1c 5.4 echo ejection fraction 65%, moderate dilation of the left and right atrial size. -Was on aspirin  prior to admission now on not antithrombotic given multiple large IPH -continue to monitor neuro status, if no significant improvement in 48 hours will need goals of care discussion.   Diarrhea Dehydration Nausea vomiting: -GI pathogen negative Now on Ceftriaxone  and Flagyl .    Sepsis secondary to GI source versus UTI: Possible UTI present on arrival - Patient with fever, tachycardia, leukocytosis, source possible GI versus UTI. - Chest x-ray without acute finding.  C. difficile unable to test due to solid stool, COVID influenza RSV negative. -CT abdomen and pelvis diffuse colon wall thickening and area of the small bowel wall thickening as may be seen in enterocolitis of infectious or inflammatory etiology.  Possible esophagitis.  Slightly thick walled urinary bladder. - Completed ceftriaxone . - Still having low-grade fever repeat UA negative.  Will repeat chest x-ray - Urine culture no growth today, blood culture no growth today, respiratory panel completed negative. -Started on Flagyl  on 1/25-- resume ceftriaxone .  Still having fever. Follow repeated blood cultures.   Hypokalemia Hypophosphatemia - Replace as needed  Stress-induced hyperglycemia: Continue sliding scale insulin   History of vascular dementia Anxiety Depression - Ammonia 19, Lexapro  was discontinued by husband - Continue Namenda  and Aricept    Anemia of chronic illness: B12: 1200, folate more than 20, iron within normal limit Transfuse for hemoglobin less than 7  GERD: -Continue Pepcid   Risk for malnutrition Dysphagia Core trak  Place  1/21st On tube feeding       Nutrition Problem: Moderate Malnutrition Etiology: social / environmental circumstances    Signs/Symptoms: mild fat depletion, moderate muscle depletion    Interventions: Refer to RD note for recommendations, Tube feeding  Estimated body mass index is 22.44 kg/m as calculated from the following:   Height as of this encounter: 4' 10 (1.473 m).   Weight as of this encounter: 48.7 kg.   DVT prophylaxis:heparin    Code Status: Discussed with Husband, in this circumstances [name] would not want to be on life support or would not want heroic  measures like chest compression, defibrillation, or mechanical intubation.  I have updated her CODE STATUS to DNR/DNI.  Plan to continue with current management and reassess her condition in 24 to 48 hours. Family Communication:Husband updated over phone 1/25 Disposition Plan:  Status is: Inpatient Remains inpatient appropriate because: management of ICH    Consultants:  Neurology Neurosurgery CCM  Procedures:  none  Antimicrobials:    Subjective: Keep eyes close, she was able to say good morning today with very soft voice.   Objective: Vitals:   08/15/24 2220 08/15/24 2340 08/16/24 0406 08/16/24 0425  BP:  (!) 130/54 (!) 125/52   Pulse:  92 87   Resp:  (!) 22 (!) 21   Temp: (!) 101 F (38.3 C) 99.7 F (37.6 C) 99.1 F (37.3 C)   TempSrc: Axillary Oral Oral   SpO2:  99% 98%   Weight:    48.7 kg  Height:        Intake/Output Summary (Last 24 hours) at 08/16/2024 0656 Last data filed at 08/16/2024 0113 Gross per 24 hour  Intake 1032.39 ml  Output 700 ml  Net 332.39 ml   Filed Weights   08/14/24 0422 08/15/24 0500 08/16/24 0425  Weight: 44.3 kg 48.6 kg 48.7 kg    Examination:  General exam: NAD, Frail.  Respiratory system: CTA Cardiovascular system: S 1, S 2 RRR Gastrointestinal system: BS present, soft, nt Central nervous system: lethargic  Data Reviewed: I have  personally reviewed following labs and imaging studies  CBC: Recent Labs  Lab 08/10/24 2135 08/10/24 2148 08/11/24 0605 08/12/24 0502 08/14/24 0844 08/15/24 0726  WBC 15.4*  --  13.5* 12.6* 11.7* 11.3*  NEUTROABS 13.6*  --   --  11.0*  --   --   HGB 10.2* 9.9* 10.3* 10.2* 9.2* 9.9*  HCT 30.6* 29.0* 29.9* 29.9* 27.8* 29.4*  MCV 95.6  --  94.9 95.2 96.5 95.1  PLT 268  --  246 224 227 234   Basic Metabolic Panel: Recent Labs  Lab 08/11/24 0605 08/12/24 0502 08/13/24 0246 08/14/24 0844 08/15/24 0726 08/16/24 0144  NA 137 138 141 143 144 145  K 3.8 3.2* 3.6 3.7 3.8 3.6  CL 99 104 107 109 107 109  CO2 26 23 25 25 28 28   GLUCOSE 91 139* 134* 221* 145* 130*  BUN 17 24* 23 23 22  26*  CREATININE 1.00 0.97 0.81 0.71 0.78 0.73  CALCIUM 9.1 8.7* 8.4* 8.6* 8.4* 8.4*  MG 1.7 2.0 2.0 2.2 2.1  --   PHOS 3.4 2.4* 1.5* 1.5* 2.9  --    GFR: Estimated Creatinine Clearance: 33.8 mL/min (by C-G formula based on SCr of 0.73 mg/dL). Liver Function Tests: Recent Labs  Lab 08/11/24 0605  AST 30  ALT 11  ALKPHOS 54  BILITOT 0.3  PROT 6.6  ALBUMIN 3.8   No results for input(s): LIPASE,  AMYLASE in the last 168 hours. Recent Labs  Lab 08/11/24 1045  AMMONIA 19   Coagulation Profile: Recent Labs  Lab 08/11/24 0014  INR 1.1   Cardiac Enzymes: No results for input(s): CKTOTAL, CKMB, CKMBINDEX, TROPONINI in the last 168 hours. BNP (last 3 results) No results for input(s): PROBNP in the last 8760 hours. HbA1C: No results for input(s): HGBA1C in the last 72 hours.  CBG: Recent Labs  Lab 08/15/24 1133 08/15/24 1642 08/15/24 2049 08/16/24 0015 08/16/24 0401  GLUCAP 159* 150* 138* 137* 146*   Lipid Profile: No results for input(s): CHOL, HDL, LDLCALC, TRIG, CHOLHDL, LDLDIRECT in the last 72 hours.  Thyroid  Function Tests: No results for input(s): TSH, T4TOTAL, FREET4, T3FREE, THYROIDAB in the last 72 hours. Anemia Panel: No results  for input(s): VITAMINB12, FOLATE, FERRITIN, TIBC, IRON, RETICCTPCT in the last 72 hours. Sepsis Labs: Recent Labs  Lab 08/10/24 2155 08/11/24 0028  LATICACIDVEN 2.0* 1.9    Recent Results (from the past 240 hours)  Culture, blood (routine x 2)     Status: None   Collection Time: 08/10/24  9:10 PM   Specimen: BLOOD  Result Value Ref Range Status   Specimen Description BLOOD RIGHT ANTECUBITAL  Final   Special Requests   Final    BOTTLES DRAWN AEROBIC AND ANAEROBIC Blood Culture results may not be optimal due to an inadequate volume of blood received in culture bottles   Culture   Final    NO GROWTH 5 DAYS Performed at [facility], [address] [place], [place] [address]    Report Status 08/15/2024 FINAL  Final  Resp panel by RT-PCR (RSV, Flu A&B, Covid) Anterior Nasal Swab     Status: None   Collection Time: 08/10/24  9:11 PM   Specimen: Anterior Nasal Swab  Result Value Ref Range Status   SARS Coronavirus 2 by RT PCR NEGATIVE NEGATIVE Final   Influenza A by PCR NEGATIVE NEGATIVE Final   Influenza B by PCR NEGATIVE NEGATIVE Final    Comment: (NOTE) The Xpert Xpress SARS-CoV-2/FLU/RSV plus assay is intended as an aid in the diagnosis of influenza from Nasopharyngeal swab specimens and should not be used as a sole basis for treatment. Nasal washings and aspirates are unacceptable for Xpert Xpress SARS-CoV-2/FLU/RSV testing.  Fact Sheet for Patients: bloggercourse.com  Fact Sheet for Healthcare Providers: seriousbroker.it  This test is not yet approved or cleared by the [place]  FDA and has been authorized for detection and/or diagnosis of SARS-CoV-2 by FDA under an Emergency Use Authorization (EUA). This EUA will remain in effect (meaning this test can be used) for the duration of the COVID-19 declaration under Section 564(b)(1) of the Act, 21 U.S.C. section 360bbb-3(b)(1), unless the authorization  is terminated or revoked.     Resp Syncytial Virus by PCR NEGATIVE NEGATIVE Final    Comment: (NOTE) Fact Sheet for Patients: bloggercourse.com  Fact Sheet for Healthcare Providers: seriousbroker.it  This test is not yet approved or cleared by the [place]  FDA and has been authorized for detection and/or diagnosis of SARS-CoV-2 by FDA under an Emergency Use Authorization (EUA). This EUA will remain in effect (meaning this test can be used) for the duration of the COVID-19 declaration under Section 564(b)(1) of the Act, 21 U.S.C. section 360bbb-3(b)(1), unless the authorization is terminated or revoked.  Performed at [facility], [address] [place], [place] [address]   MRSA Next Gen by PCR, Nasal     Status: None  Collection Time: 08/11/24  1:01 AM   Specimen: Nasal Mucosa; Nasal Swab  Result Value Ref Range Status   MRSA by PCR Next Gen NOT DETECTED NOT DETECTED Final    Comment: (NOTE) The GeneXpert MRSA Assay (FDA approved for NASAL specimens only), is one component of a comprehensive MRSA colonization surveillance program. It is not intended to diagnose MRSA infection nor to guide or monitor treatment for MRSA infections. Test performance is not FDA approved in patients less than 65 years old. Performed at [facility], [address], [place], [place] [address]   Gastrointestinal Panel by PCR , Stool     Status: None   Collection Time: 08/11/24  7:31 AM   Specimen: STOOL  Result Value Ref Range Status   Campylobacter species NOT DETECTED NOT DETECTED Final   Plesimonas shigelloides NOT DETECTED NOT DETECTED Final   Salmonella species NOT DETECTED NOT DETECTED Final   Yersinia enterocolitica NOT DETECTED NOT DETECTED Final   Vibrio species NOT DETECTED NOT DETECTED Final   Vibrio cholerae NOT DETECTED NOT DETECTED Final   Enteroaggregative E coli (EAEC) NOT DETECTED NOT DETECTED Final    Enteropathogenic E coli (EPEC) NOT DETECTED NOT DETECTED Final   Enterotoxigenic E coli (ETEC) NOT DETECTED NOT DETECTED Final   Shiga like toxin producing E coli (STEC) NOT DETECTED NOT DETECTED Final   Shigella/Enteroinvasive E coli (EIEC) NOT DETECTED NOT DETECTED Final   Cryptosporidium NOT DETECTED NOT DETECTED Final   Cyclospora cayetanensis NOT DETECTED NOT DETECTED Final   Entamoeba histolytica NOT DETECTED NOT DETECTED Final   Giardia lamblia NOT DETECTED NOT DETECTED Final   Adenovirus F40/41 NOT DETECTED NOT DETECTED Final   Astrovirus NOT DETECTED NOT DETECTED Final   Norovirus GI/GII NOT DETECTED NOT DETECTED Final   Rotavirus A NOT DETECTED NOT DETECTED Final   Sapovirus (I, II, IV, and V) NOT DETECTED NOT DETECTED Final    Comment: Performed at [facility], [address] [place], [place] [address]  Respiratory (~20 pathogens) panel by PCR     Status: None   Collection Time: 08/11/24  9:14 AM   Specimen: Nasopharyngeal Swab; Respiratory  Result Value Ref Range Status   Adenovirus NOT DETECTED NOT DETECTED Final   Coronavirus 229E NOT DETECTED NOT DETECTED Final    Comment: (NOTE) The Coronavirus on the Respiratory Panel, DOES NOT test for the novel  Coronavirus (2019 nCoV)    Coronavirus HKU1 NOT DETECTED NOT DETECTED Final   Coronavirus NL63 NOT DETECTED NOT DETECTED Final   Coronavirus OC43 NOT DETECTED NOT DETECTED Final   Metapneumovirus NOT DETECTED NOT DETECTED Final   Rhinovirus / Enterovirus NOT DETECTED NOT DETECTED Final   Influenza A NOT DETECTED NOT DETECTED Final   Influenza B NOT DETECTED NOT DETECTED Final   Parainfluenza Virus 1 NOT DETECTED NOT DETECTED Final   Parainfluenza Virus 2 NOT DETECTED NOT DETECTED Final   Parainfluenza Virus 3 NOT DETECTED NOT DETECTED Final   Parainfluenza Virus 4 NOT DETECTED NOT DETECTED Final   Respiratory Syncytial Virus NOT DETECTED NOT DETECTED Final   Bordetella pertussis NOT DETECTED NOT DETECTED  Final   Bordetella Parapertussis NOT DETECTED NOT DETECTED Final   Chlamydophila pneumoniae NOT DETECTED NOT DETECTED Final   Mycoplasma pneumoniae NOT DETECTED NOT DETECTED Final    Comment: Performed at [facility], [address], [place], [place] [address]  Urine Culture (for pregnant, neutropenic or urologic patients or patients with an indwelling urinary catheter)  Status: None   Collection Time: 08/11/24  9:46 AM   Specimen: Urine, Clean Catch  Result Value Ref Range Status   Specimen Description URINE, CLEAN CATCH  Final   Special Requests NONE  Final   Culture   Final    NO GROWTH Performed at [facility], [address], [place], [place] [address]    Report Status 08/12/2024 FINAL  Final  Culture, blood (routine x 2)     Status: None (Preliminary result)   Collection Time: 08/11/24 10:49 AM   Specimen: BLOOD LEFT ARM  Result Value Ref Range Status   Specimen Description BLOOD LEFT ARM  Final   Special Requests   Final    BOTTLES DRAWN AEROBIC ONLY Blood Culture results may not be optimal due to an inadequate volume of blood received in culture bottles   Culture   Final    NO GROWTH 4 DAYS Performed at [facility], [address], [place], [place] [address]    Report Status PENDING  Incomplete         Radiology Studies: DG CHEST PORT 1 VIEW Result Date: 08/15/2024 CLINICAL DATA:  Fever. EXAM: PORTABLE CHEST 1 VIEW COMPARISON:  Chest radiograph dated 08/10/2024 FINDINGS: Feeding tube extends below the diaphragm with tip beyond the inferior margin of the image. There is a small left pleural effusion and left lung base atelectasis. Faint diffuse interstitial densities likely represent edema. Pneumonia is not excluded. No pneumothorax. Stable cardiac silhouette. Atherosclerotic calcification of the aorta. No acute osseous pathology. IMPRESSION: 1. Small left pleural effusion and left lung base atelectasis. 2. Probable mild interstitial edema.  Electronically Signed   By: Vanetta Chou M.D.   On: 08/15/2024 09:06        Scheduled Meds:  amLODipine   5 mg Per Tube Daily   donepezil   10 mg Per Tube QHS   feeding supplement (PROSource TF20)  60 mL Per Tube Daily   heparin  injection (subcutaneous)  5,000 Units Subcutaneous Q8H   insulin  aspart  0-9 Units Subcutaneous Q4H   levETIRAcetam   500 mg Per Tube BID   memantine   5 mg Per Tube BID   multivitamin with minerals  1 tablet Per Tube Daily   mouth rinse  15 mL Mouth Rinse 4 times per day   pantoprazole  (PROTONIX ) IV  40 mg Intravenous Q24H   thiamine   100 mg Per Tube Daily   Continuous Infusions:  cefTRIAXone  (ROCEPHIN )  IV Stopped (08/15/24 1434)   feeding supplement (OSMOLITE 1.5 CAL) 35 mL/hr at 08/16/24 0113   metronidazole  500 mg (08/16/24 0211)     LOS: 6 days    Time spent: 35 Minutes    Dyshawn Cangelosi A Tieisha Darden, MD [facility]   If 7PM-7AM, please contact night-coverage [email or web address]  08/16/2024, 6:56 AM   "

## 2024-08-16 NOTE — Evaluation (Signed)
 Clinical/Bedside Swallow Evaluation Patient Details  Name: [name] MRN: [id number] Date of Birth: 1941-05-30  Today's Date: 08/16/2024 Time: SLP Start Time (ACUTE ONLY): 1420 SLP Stop Time (ACUTE ONLY): 1432 SLP Time Calculation (min) (ACUTE ONLY): 12 min  Past Medical History:  Past Medical History:  Diagnosis Date   Anemia    Anxiety    Arthritis    Chronic kidney disease    COPD (chronic obstructive pulmonary disease) (HCC)    Dementia (HCC)    vascular dementia   GERD (gastroesophageal reflux disease)    History of kidney stones    Macular degeneration of left eye    Wet and gets injections q monthly   Smoker    1- 1 1/2 ppd  per Husband   Stroke [facility])    TIAs   Past Surgical History:  Past Surgical History:  Procedure Laterality Date   CHOLECYSTECTOMY  10/24/2010   Laparoscopic   COLECTOMY  07/22/2004   BENIGN TUMOR   CYSTOSCOPY W/ URETERAL STENT PLACEMENT Left 01/14/2024   Procedure: CYSTOSCOPY, WITH RETROGRADE PYELOGRAM AND URETERAL STENT INSERTION;  Surgeon: Elisabeth Valli BIRCH, MD;  Location: [facility] OR;  Service: Urology;  Laterality: Left;   CYSTOSCOPY/URETEROSCOPY/HOLMIUM LASER/STENT PLACEMENT Left 03/11/2024   Procedure: CYSTOSCOPY/URETEROSCOPY/HOLMIUM LASER/STENT PLACEMENT;  Surgeon: Elisabeth Valli BIRCH, MD;  Location: [facility] ORS;  Service: Urology;  Laterality: Left;  CYSTOSCOPY/LEFT URETEROSCOPY/HOLMIUM LASER/STENT EXCHANGE   DILATION AND CURETTAGE OF UTERUS     EYE SURGERY Bilateral    cataract extraction   TRANSESOPHAGEAL ECHOCARDIOGRAM (CATH LAB) N/A 10/17/2023   Procedure: TRANSESOPHAGEAL ECHOCARDIOGRAM;  Surgeon: Pietro Redell RAMAN, MD;  Location: [facility] INVASIVE CV LAB;  Service: Cardiovascular;  Laterality: N/A;   HPI:  84 y.o. F adm 08/11/24 with N/V/D and fall. Pt with multiple large acute intraparenchymal hemorrhages in Lt parietal and occipital lobes. PMH: vascular dementia, anemia, DDD, COPD, MDD, macular degeneration    Assessment / Plan / Recommendation   Clinical Impression  Continue NPO, 1-2 ice chips after oral care if alert. ST will continue to follow.   Pt followed one command, kept eyes closed, stated name,  incorrect age and attempted a phrase.  Moderate amount of thick white mucous removed from oral cavity. Reduced oral manipulation, delayed transit, swishing with water /ice chip initiating swallow with intermittent suspected delays.Immediate cough after ice chip. Pt not ready for further po trials or likely instrumental presently.  ST to continue. Continue oral care and may have 1-2 ice chips if alert enough. SLP Visit Diagnosis: Dysphagia, unspecified (R13.10)    Aspiration Risk  Moderate aspiration risk    Diet Recommendation           Other Recommendations Oral Care Recommendations: Oral care QID     Swallow Evaluation Recommendations Recommendations: NPO;Ice chips PRN after oral care Medication Administration: Via alternative means Oral care recommendations: Oral care QID (4x/day)   Assistance Recommended at Discharge    Functional Status Assessment Patient has had a recent decline in their functional status and demonstrates the ability to make significant improvements in function in a reasonable and predictable amount of time.  Frequency and Duration min 2x/week  2 weeks       Prognosis Prognosis for improved oropharyngeal function: Fair Barriers to Reach Goals: Cognitive deficits      Swallow Study   General Date of Onset: 08/16/24 HPI: 84 y.o. F adm 08/11/24 with N/V/D and fall. Pt with multiple large acute intraparenchymal hemorrhages in Lt parietal and occipital lobes. PMH: vascular dementia, anemia,  DDD, COPD, MDD, macular degeneration Type of Study: Bedside Swallow Evaluation Previous Swallow Assessment:  (none) Diet Prior to this Study: NPO;Cortrak/Small bore NG tube Temperature Spikes Noted: No Respiratory Status: Nasal cannula History of Recent Intubation: No Behavior/Cognition:  Lethargic/Drowsy;Cooperative;Confused (adequately awake for po trials) Oral Cavity Assessment: Excessive secretions Oral Care Completed by SLP: Yes Oral Cavity - Dentition:  (could not fully view) Vision:  (eyes closed) Self-Feeding Abilities: Needs assist Patient Positioning: Upright in bed Baseline Vocal Quality: Normal Volitional Cough: Cognitively unable to elicit Volitional Swallow: Unable to elicit    Oral/Motor/Sensory Function Overall Oral Motor/Sensory Function:  (only opened mouth, will cont to assess)   Ice Chips Ice chips: Impaired Presentation: Spoon Oral Phase Functional Implications: Prolonged oral transit Pharyngeal Phase Impairments: Cough - Immediate   Thin Liquid Thin Liquid: Impaired Presentation: Spoon Oral Phase Functional Implications: Prolonged oral transit;Other (comment) (swishing) Pharyngeal  Phase Impairments: Suspected delayed Swallow    Nectar Thick Nectar Thick Liquid: Not tested   Honey Thick Honey Thick Liquid: Not tested   Puree Puree: Not tested   Solid     Solid: Not tested      [name] 08/16/2024,2:59 PM

## 2024-08-17 DIAGNOSIS — S066XAA Traumatic subarachnoid hemorrhage with loss of consciousness status unknown, initial encounter: Secondary | ICD-10-CM | POA: Diagnosis not present

## 2024-08-17 DIAGNOSIS — S0636AA Traumatic hemorrhage of cerebrum, unspecified, with loss of consciousness status unknown, initial encounter: Secondary | ICD-10-CM | POA: Diagnosis not present

## 2024-08-17 DIAGNOSIS — I629 Nontraumatic intracranial hemorrhage, unspecified: Secondary | ICD-10-CM | POA: Diagnosis not present

## 2024-08-17 DIAGNOSIS — Q282 Arteriovenous malformation of cerebral vessels: Secondary | ICD-10-CM | POA: Diagnosis not present

## 2024-08-17 DIAGNOSIS — S065XAA Traumatic subdural hemorrhage with loss of consciousness status unknown, initial encounter: Secondary | ICD-10-CM | POA: Diagnosis not present

## 2024-08-17 DIAGNOSIS — F17210 Nicotine dependence, cigarettes, uncomplicated: Secondary | ICD-10-CM | POA: Diagnosis not present

## 2024-08-17 DIAGNOSIS — I69391 Dysphagia following cerebral infarction: Secondary | ICD-10-CM | POA: Diagnosis not present

## 2024-08-17 LAB — GLUCOSE, CAPILLARY
Glucose-Capillary: 106 mg/dL — ABNORMAL HIGH (ref 70–99)
Glucose-Capillary: 123 mg/dL — ABNORMAL HIGH (ref 70–99)
Glucose-Capillary: 124 mg/dL — ABNORMAL HIGH (ref 70–99)
Glucose-Capillary: 132 mg/dL — ABNORMAL HIGH (ref 70–99)
Glucose-Capillary: 135 mg/dL — ABNORMAL HIGH (ref 70–99)
Glucose-Capillary: 141 mg/dL — ABNORMAL HIGH (ref 70–99)
Glucose-Capillary: 153 mg/dL — ABNORMAL HIGH (ref 70–99)

## 2024-08-17 LAB — CBC
HCT: 29.8 % — ABNORMAL LOW (ref 36.0–46.0)
Hemoglobin: 9.8 g/dL — ABNORMAL LOW (ref 12.0–15.0)
MCH: 31.8 pg (ref 26.0–34.0)
MCHC: 32.9 g/dL (ref 30.0–36.0)
MCV: 96.8 fL (ref 80.0–100.0)
Platelets: 273 10*3/uL (ref 150–400)
RBC: 3.08 MIL/uL — ABNORMAL LOW (ref 3.87–5.11)
RDW: 13.9 % (ref 11.5–15.5)
WBC: 10.5 10*3/uL (ref 4.0–10.5)
nRBC: 0 % (ref 0.0–0.2)

## 2024-08-17 LAB — BASIC METABOLIC PANEL WITH GFR
Anion gap: 7 (ref 5–15)
BUN: 27 mg/dL — ABNORMAL HIGH (ref 8–23)
CO2: 30 mmol/L (ref 22–32)
Calcium: 8.6 mg/dL — ABNORMAL LOW (ref 8.9–10.3)
Chloride: 109 mmol/L (ref 98–111)
Creatinine, Ser: 0.7 mg/dL (ref 0.44–1.00)
GFR, Estimated: >60 mL/min
Glucose, Bld: 118 mg/dL — ABNORMAL HIGH (ref 70–99)
Potassium: 3.7 mmol/L (ref 3.5–5.1)
Sodium: 146 mmol/L — ABNORMAL HIGH (ref 135–145)

## 2024-08-17 MED ORDER — FREE WATER
200.0000 mL | Status: DC
  Administered 2024-08-17 – 2024-08-24 (×39): 200 mL

## 2024-08-17 NOTE — Progress Notes (Signed)
 " Daily Progress Note   Date: 08/17/2024   Patient Name: [name]  DOB: 05/12/1941  MRN: [id number]  Age / Sex: 84 y.o., female  Attending Physician: Madelyne Owen LABOR, MD Primary Care Physician: Patti Mliss LABOR, FNP Admit Date: 08/10/2024 Length of Stay: 7 days  Reason for Follow-up: Establishing goals of care  Past Medical History:  Diagnosis Date   Anemia    Anxiety    Arthritis    Chronic kidney disease    COPD (chronic obstructive pulmonary disease) (HCC)    Dementia (HCC)    vascular dementia   GERD (gastroesophageal reflux disease)    History of kidney stones    Macular degeneration of left eye    Wet and gets injections q monthly   Smoker    1- 1 1/2 ppd  per Husband   Stroke (HCC)    TIAs    Subjective:   Subjective: Chart Reviewed. Updates received. Patient Assessed. Created space and opportunity for patient  and family to explore thoughts and feelings regarding current medical situation.  Today's Discussion: Today before meeting with the patient/family, I reviewed the chart notes including Nursing note from yesterday, hospitalist note from yesterday, TOC note from yesterday, neurology note from yesterday, nursing note from today, dietitian note from today, hospitalist note from today, neurology note from today. I also reviewed vital signs, nursing flowsheets, medication administrations record, labs, and imaging.   Today I saw the patient bedside, no family is present.  The patient was moving spontaneously, does not open her eyes spontaneously.  When I called her name and gently shook her she did open her eyes, did not say anything and quickly closes her eyes again.  Later in the day I attempted to reach out to the patient's husband but was unsuccessful.  I will continue attempts to reach out for ongoing GOC conversations.  At my last visit we allowed time for outcomes but at this point, with no significant improvement, we will plan for conversations around how  to proceed if she is going to remain in this state.  Husband seemed to indicate him at last visit that pending how she did over the next few days (currently last few days) it would determine decisions made.  After seeing the patient I also debriefed with the medical team.  The hospital physician shares that she had a discussion with the patient's husband and indicated that the only potential, at this point, of going home would be with hospice support.  If we determined through further conversations that hospice is not desired then would likely require facility placement of some sort.  Review of Systems  Unable to perform ROS   Objective:   Primary Diagnoses: Present on Admission: **None**   Vital Signs:  BP 135/63 (BP Location: Right Arm)   Pulse 86   Temp 98.4 F (36.9 C)   Resp 18   Ht 4' 10 (1.473 m)   Wt 47 kg   SpO2 100%   BMI 21.66 kg/m   Physical Exam Vitals and nursing note reviewed.  Constitutional:      General: She is sleeping. She is not in acute distress. HENT:     Head: Normocephalic and atraumatic.     Nose:     Comments: Core track remains in place Cardiovascular:     Rate and Rhythm: Normal rate.  Pulmonary:     Effort: Pulmonary effort is normal. No respiratory distress.     Breath sounds: No wheezing or  rhonchi.  Abdominal:     General: Abdomen is flat. Bowel sounds are normal. There is no distension.     Palpations: Abdomen is soft.  Skin:    General: Skin is warm and dry.  Neurological:     Comments: Opens eyes to voice/touch, not communicative with me today, not following commands     Palliative Assessment/Data: 10%   Existing Vynca/ACP Documentation: None  Assessment & Plan:   HPI/Patient Profile:  84 y.o. female  with past medical history of vascular dementia, anemia of chronic illness, GERD, bilateral ureteral stents, and prior TIA, presents for evaluation of nausea, vomiting, diarrhea for 3 days and fall.  After ED evaluation she  was admitted on 08/10/2024 with multiple large acute intraparenchymal hemorrhages centered of the left parietal and occipital lobes without midline shift, AVM, fall of unclear mechanism, diarrhea, dehydration, nausea/vomiting, sepsis secondary to GI versus UTI source, vascular dementia, and others.   08/17/2024: The patient has not made significant progress over the past several days since I last saw them.  Core track remains in place, remains n.p.o.  At this point will need further conversations with the patient's husband to determine what type of care would be acceptable to the patient in this situation, especially if no significant improvement in the next 48 hours.  Will continue attempts to reach.  SUMMARY OF RECOMMENDATIONS   DNR-Limited Continue current scope of care Will continue attempts to reach patient's husband for ongoing GOC conversations Palliative medicine will continue to follow  Symptom Management:  Per primary team Palliative medicine is available to assist as needed  Code Status: DNR - Limited (DNR/DNI)  Prognosis: Unable to determine  Discharge Planning: To Be Determined  Discussed with: Medical team, nursing team  Thank you for allowing us  to participate in the care of MALIHA OUTTEN PMT will continue to support holistically.  Time Total: 25 min  Detailed review of medical records (labs, imaging, vital signs), medically appropriate exam, discussed with treatment team, counseling and education to patient, family, & staff, documenting clinical information, medication management, coordination of care  Camellia Kays, NP Palliative Medicine Team  Team Phone # [phone] (Nights/Weekends)  03/20/2021, 8:17 AM  "

## 2024-08-17 NOTE — Progress Notes (Signed)
 Physical Therapy Treatment Patient Details Name: [name] MRN: [id number] DOB: 1941/07/02 Today's Date: 08/17/2024   History of Present Illness 84 y.o. F adm 08/11/24 with N/V/D and fall. Pt with multiple large acute intraparenchymal hemorrhages in Lt parietal and occipital lobes. PMH: vascular dementia, anemia, DDD, COPD, MDD, macular degeneration    PT Comments  Pt with poor tolerance to treatment today. Pt with similar presentation to previous session. Pt remains lethargic with zero command following. +2 total A for all mobility. No change in DC/DME recs at this time. PT will continue to follow.     If plan is discharge home, recommend the following: A lot of help with walking and/or transfers;A lot of help with bathing/dressing/bathroom;Supervision due to cognitive status;Help with stairs or ramp for entrance;Assist for transportation;Assistance with feeding;Assistance with cooking/housework;Direct supervision/assist for medications management;Direct supervision/assist for financial management   Can travel by private vehicle     No  Equipment Recommendations  Hospital bed    Recommendations for Other Services       Precautions / Restrictions Precautions Precautions: Fall;Other (comment) Recall of Precautions/Restrictions: Impaired Precaution/Restrictions Comments: cortrak; SBP <160 Restrictions Weight Bearing Restrictions Per Provider Order: No     Mobility  Bed Mobility Overal bed mobility: Needs Assistance Bed Mobility: Supine to Sit, Sit to Supine     Supine to sit: +2 for physical assistance, Total assist Sit to supine: +2 for physical assistance, Total assist   General bed mobility comments: +2 total via helicopter method.    Transfers Overall transfer level: Needs assistance Equipment used: 2 person hand held assist Transfers: Sit to/from Stand Sit to Stand: +2 physical assistance, Total assist           General transfer comment: Attempted to stand  with partial rise noted however pt with no initiation requiring +2 total A.    Ambulation/Gait               General Gait Details: unsafe   Stairs             Wheelchair Mobility     Tilt Bed    Modified Rankin (Stroke Patients Only) Modified Rankin (Stroke Patients Only) Pre-Morbid Rankin Score: Slight disability Modified Rankin: Severe disability     Balance Overall balance assessment: Needs assistance Sitting-balance support: No upper extremity supported, Feet supported Sitting balance-Leahy Scale: Poor Sitting balance - Comments: progression from max to min assist sitting EOB with maintained eyes closed and lack of command following Postural control: Posterior lean, Right lateral lean                                  Communication Communication Communication: Impaired Factors Affecting Communication: Difficulty expressing self  Cognition Arousal: Lethargic Behavior During Therapy: Flat affect   PT - Cognitive impairments: Difficult to assess, No family/caregiver present to determine baseline Difficult to assess due to: Level of arousal                     PT - Cognition Comments: Pt still very lethargic with poor command following. Briefly responded to her name but mostly in and out of sleep. Following commands: Impaired Following commands impaired:  (no command following)    Cueing Cueing Techniques: Verbal cues, Gestural cues, Tactile cues  Exercises      General Comments General comments (skin integrity, edema, etc.): BP: 131/62 supine, BP: 131/68 seated, BP: 140/71 seated after standing attempt.  Pertinent Vitals/Pain Pain Assessment Pain Assessment: Faces Faces Pain Scale: Hurts a little bit Pain Descriptors / Indicators: Grimacing Pain Intervention(s): Monitored during session, Limited activity within patient's tolerance, Repositioned    Home Living                          Prior Function             PT Goals (current goals can now be found in the care plan section) Progress towards PT goals: Progressing toward goals    Frequency    Min 1X/week      PT Plan      Co-evaluation              AM-PAC PT 6 Clicks Mobility   Outcome Measure  Help needed turning from your back to your side while in a flat bed without using bedrails?: Total Help needed moving from lying on your back to sitting on the side of a flat bed without using bedrails?: Total Help needed moving to and from a bed to a chair (including a wheelchair)?: Total Help needed standing up from a chair using your arms (e.g., wheelchair or bedside chair)?: Total Help needed to walk in hospital room?: Total Help needed climbing 3-5 steps with a railing? : Total 6 Click Score: 6    End of Session   Activity Tolerance: Patient limited by lethargy Patient left: in bed;with call bell/phone within reach;with bed alarm set Nurse Communication: Mobility status PT Visit Diagnosis: Unsteadiness on feet (R26.81);Muscle weakness (generalized) (M62.81);Difficulty in walking, not elsewhere classified (R26.2)     Time: 8554-8540 PT Time Calculation (min) (ACUTE ONLY): 14 min  Charges:    $Therapeutic Activity: 8-22 mins PT General Charges $$ ACUTE PT VISIT: 1 Visit                     Sueellen NOVAK, PT, DPT Acute Rehab Services [phone]    [name] 08/17/2024, 4:09 PM

## 2024-08-17 NOTE — Plan of Care (Signed)
  Problem: Nutritional: Goal: Maintenance of adequate nutrition will improve Outcome: Progressing Goal: Progress toward achieving an optimal weight will improve Outcome: Progressing   Problem: Skin Integrity: Goal: Risk for impaired skin integrity will decrease Outcome: Progressing   Problem: Clinical Measurements: Goal: Ability to maintain clinical measurements within normal limits will improve Outcome: Progressing Goal: Will remain free from infection Outcome: Progressing Goal: Diagnostic test results will improve Outcome: Progressing Goal: Respiratory complications will improve Outcome: Progressing Goal: Cardiovascular complication will be avoided Outcome: Progressing   Problem: Elimination: Goal: Will not experience complications related to bowel motility Outcome: Progressing Goal: Will not experience complications related to urinary retention Outcome: Progressing   Problem: Safety: Goal: Ability to remain free from injury will improve Outcome: Progressing   Problem: Pain Managment: Goal: General experience of comfort will improve and/or be controlled Outcome: Progressing

## 2024-08-17 NOTE — Progress Notes (Signed)
 Nutrition Follow-up  DOCUMENTATION CODES:   Non-severe (moderate) malnutrition in context of social or environmental circumstances  INTERVENTION:  Initiate tube feeding via Cortrak tube: Osmolite 1.5 at 63ml/h(840 ml per day) FWF 200 ml q4h, per MD (1200 ml per day) Prosource TF20 60 ml daily   Provides 1340 kcal, 72 gm protein, 638 ml free water  daily  Continue MVI with minerals daily   Monitor GOC discussion and modify plan of care as indicated   NUTRITION DIAGNOSIS:  Moderate Malnutrition related to social / environmental circumstances as evidenced by mild fat depletion, moderate muscle depletion.  GOAL:  Patient will meet greater than or equal to 90% of their needs  MONITOR:  TF tolerance  REASON FOR ASSESSMENT:  Consult Enteral/tube feeding initiation and management  ASSESSMENT:   Pt who lives at home with spouse and independent with PMH of dementia, anemia of chronic illness, CKD, COPD, GERD, prior TIA, smoker, macular degeneration of L eye, and bilateral ureteral stents admitted after 3 days of diarrhea, nausea, and vomiting who was found down after fall with multiple large acute intraparenchymal hemorrhages in L parietal and occipital lobes.  1/21 - s/p cortrak placement; tip gastric  1/24 - transfer out of ICU  Speech evaluated patient yesterday. Recommending continued NPO. Neurology notes some improvement and has now signed off. No longer febrile. Palliative engaged for ongoing GOC.   Patient sleeping at time of bedside visit, but easy to rouse. She answers questions occasionally. Difficult to understand at times. Spoke with RN who endorses no significant concerns relating to TF at this time.   Noted with output in FMS. Neither RD nor RN could verify when bag was last changed. No output documented overnight. Will continue to monitor.  Sodium did trend up. Free water  flushes in place.    Medications reviewed and include: SS Novolog , MVI, pantoprazole ,  thiamine  (completed today), IV ABX   Home meds include: pepto, calcium chews, vitamin B12, vitamin D3, one a day womens, probiotic    Labs reviewed:  Na+ 146 (H) Vitamin B12 1221  CBGs 106-146 x24 hours A1c 5.4 (07/2024)  Diet Order:   Diet Order             Diet NPO time specified  Diet effective now             EDUCATION NEEDS:  Not appropriate for education at this time  Skin:  Skin Assessment: Reviewed RN Assessment  Last BM:  1/26 - type 7 x2  Height:  Ht Readings from Last 1 Encounters:  08/10/24 4' 10 (1.473 m)   Weight:  Wt Readings from Last 1 Encounters:  08/17/24 47 kg    Ideal Body Weight:  43.9 kg  BMI:  Body mass index is 21.66 kg/m.  Estimated Nutritional Needs:   Kcal:  1200-1400  Protein:  60-75 grams  Fluid:  1.5 L/day  Meghan Deaner MS, RD, LDN Registered Dietitian I Clinical Nutrition RD Inpatient Contact Info in Amion

## 2024-08-17 NOTE — Progress Notes (Signed)
 STROKE TEAM PROGRESS NOTE   SIGNIFICANT HOSPITAL EVENTS - Presented to the ED 1/21 after a fall. Found to be febrile with complaints of 3 days of diarrhea.  - CTH with 2 large IPHs in the left parietal and occipital lobes - Repeat CTH with stable extensive left posterior hemisphere intra-axial hemorrhages with discontinuous hyperdense blood products roughly estimated at 76mL.   INTERIM HISTORY/SUBJECTIVE No family is present today at bedside. Neurological exam suggest very slight improvement.  Pt lying in bed, awake.  Has some nonsensical speech now.  Follows very occasional commands but not consistently.  Vital signs are stable. OBJECTIVE CBC    Component Value Date/Time   WBC 10.5 08/17/2024 0541   RBC 3.08 (L) 08/17/2024 0541   HGB 9.8 (L) 08/17/2024 0541   HCT 29.8 (L) 08/17/2024 0541   PLT 273 08/17/2024 0541   MCV 96.8 08/17/2024 0541   MCH 31.8 08/17/2024 0541   MCHC 32.9 08/17/2024 0541   RDW 13.9 08/17/2024 0541   LYMPHSABS 0.8 08/12/2024 0502   MONOABS 0.6 08/12/2024 0502   EOSABS 0.0 08/12/2024 0502   BASOSABS 0.0 08/12/2024 0502   BMET    Component Value Date/Time   NA 146 (H) 08/17/2024 0541   K 3.7 08/17/2024 0541   CL 109 08/17/2024 0541   CO2 30 08/17/2024 0541   GLUCOSE 118 (H) 08/17/2024 0541   BUN 27 (H) 08/17/2024 0541   CREATININE 0.70 08/17/2024 0541   CALCIUM 8.6 (L) 08/17/2024 0541   GFRNONAA >60 08/17/2024 0541   Lab Results  Component Value Date   HGBA1C 5.4 08/12/2024   Lab Results  Component Value Date   CHOL 159 08/12/2024   HDL 73 08/12/2024   LDLCALC 70 08/12/2024   TRIG 77 08/12/2024   CHOLHDL 2.2 08/12/2024    IMAGING past 24 hours No results found.  Vitals:   08/17/24 0420 08/17/24 0441 08/17/24 0740 08/17/24 1134  BP: (!) 132/59  135/63 126/60  Pulse: 83  86 78  Resp: 20  18 16   Temp: 99 F (37.2 C)  98.4 F (36.9 C) 98.4 F (36.9 C)  TempSrc: Oral   Oral  SpO2: 100%  100% 98%  Weight:  47 kg    Height:        PHYSICAL EXAM General: Acutely ill-appearing Caucasian female laying in ICU bed Psych: Unable to assess due to patient's condition CV: Regular rate and rhythm on monitor Respiratory:  Regular, unlabored respirations on room air GI: Abdomen soft and nontender  NEURO:  Patient is awake.  She does interact a bit.  Speech is occasional word but incomprehensible speech l and not following commands consistently.. Moaning on pain stimulation. Still resisted forced eye opening. Not able to check pupil, or visual field. Not tracking. No obvious facial droop. Tongue protrusion not cooperative. On pain stimulation, mild withdraw in all extremities, seems symmetrical, or slight weaker on the right UE. Sensation, coordination and gait not tested.   ASSESSMENT/PLAN Ms. JUDYTH DEMARAIS is a 84 y.o. female with history of vascular dementia, tobacco use (50 pack-year history), COPD, CKD, anemia of chronic disease, GERD, bilateral ureteral stents, TIA presenting to the ED initially after suffering a fall with additional complaints of 3 days of diarrhea and was found to be febrile on arrival. [facility] imaging revealed two large IPHs in the left parietal and occipital lobes and patient was noted to be grossly aphasic.  ICH: Large acute ICH in the left parietal and occipital lobes with regional IVH/SAH/SDH  extension, etiology: Likely AVM vs. traumatic vs. Hemorrhagic conversion CT head 1/20: Multiple large acute ICH centered in the left parietal and occipital lobes.  Small volume of extra-axial extension of hemorrhage.  Possible IVH in the left occipital horn. No significant midline shift. Repeat CT head stable ICH roughly estimated at 76 mL.  Stable small regional SAH/SDH.  Stable regional edema and relatively mild mass effect with no midline shift.   CTA head & neck asymmetric vasculature in the superior left occipital lobe near the region of hemorrhage, suspicious for arteriovenous malformation.  Consider catheter  angiography for further evaluation.  There is an arterial supply from the left PCA.  Suspected venous component appears to drain into the superior sagittal sinus.   MRI left occipital and posterior left temporal lobe hemorrhage with intraventricular extension to the adjacent left occipital horn, atrium, and posterior body of the left lateral ventricle, similar to recent CT.  Left parietal lobe hemorrhage with suggestion of layering blood products, similar to prior.  No definite vascular malformation identified.  May consider cerebral angiogram by NSG to rule out AVM if aggressive care is desired by family and patient shows meaningful improvement 2D Echo 60-65% with moderate dilation of the left and right atrial sizes  EEG encephalopathy no Sz LDL 70 HgbA1c 5.4 VTE prophylaxis - SCDs in the setting of IPH for now aspirin  81 mg daily prior to admission, now on No antithrombotic given multiple large IPH On keppra  500mg  bid Therapy recommendations:  Pending Disposition:  pending,  palliative care consult pending  BP management Home meds: None Stable BP goal < 160  Tobacco Abuse 50-pack-year smoking history      Ready to quit? N/A  Dysphagia Patient has post-stroke dysphagia, SLP consulted NPO Coretrack in place On TF @ 35  Other Stroke Risk Factors   Other Active Problems Vascular dementia On Namenda , Aricept  COPD CKD GERD Leukocytosis WBC 13.5--12.6, on rocephin    Hospital day # 7    Patient neurological exam continues to show very slow improvement.  Palliative care consult and goals of care discussion with family have led to giving her some more time and since patient is showing some improvement plan is now to move to inpatient rehab when bed available...  Continue strict blood pressure control with systolic goal below 160.  No family at the bedside.  Discussed with Dr. Terie.  Stroke team will sign off.  Kindly call for questions       Eather Popp, MD     To  contact Stroke Continuity provider, please refer to [email or web address]. After hours, contact General Neurology

## 2024-08-17 NOTE — Progress Notes (Signed)
 " PROGRESS NOTE    [name]  FMW:[id number] DOB: 05/28/1941 DOA: 08/10/2024 PCP: Patti Mliss LABOR, FNP   Brief Narrative: 84 year old with past medical history significant for vascular dementia, anemia of chronic illness, GERD, bilateral ureteral stents, prior TIA presents with nausea vomiting and diarrhea for 3 days and a fall the night of admission.  It seems that patient hit her head.  She was on aspirin  prior to admission.  Patient was obtunded on admission admitted by the critical care team on 1/21st.  Patient was found to have multiple large acute intraparenchymal hemorrhage left parietal and occipital lobes.  Neurology and neurosurgery consulted.  Definitive treatment deferred 2 to 3 months if patient recovers.  Diagnostic angiogram may be consider if recovers.  Patient was also found to have diarrhea, dehydration and metabolic acidosis.  Sepsis secondary to GI source versus UTI.  Patient was transferred to [facility] starting 1/25. Patient continued to have fever, which improved on 1/27. She was started on ceftriaxone  and flagyl .     Assessment & Plan:   Principal Problem:   Intracranial hemorrhage (HCC) Active Problems:   Altered mental status   Fall   Diarrhea   Malnutrition of moderate degree   Traumatic left-sided intracerebral hemorrhage with unknown loss of consciousness status (HCC)   1-Multiple large acute intraparenchymal hemorrhage centered in the left parietal and occipital lobe, SAH/SDH extension. ;  Could be related to AVM versus traumatic versus hemorrhagic conversion. AVM Fall unclear mechanism -CT head 1/20: Multiple large acute IPH left parietal lobe and left occipital lobe, small volume extra axial  extension of hemorrhage.  Possible Intraventricular  extension into left occipital horn. - Repeated CT head 1/21st: Stable extensive left posterior hemisphere intra-axial hemorrhage, stable small regional subarachnoid/subdural space extension of blood.  - MRI brain:  Left occipital posterior left temporal lobe hemorrhage, left parietal lobe hemorrhage with layering blood product hyperemia. - CTA consistent with AVM - Neurosurgery following, definitive treatment deferred 2 to 3 months if patient recovers. - Continue with Keppra  -Spot EEG 1/20-second cortical dysfunction generalized cerebral dysfunction no seizures. -LDL 70 A1c 5.4 echo ejection fraction 65%, moderate dilation of the left and right atrial size. -Was on aspirin  prior to admission now on not antithrombotic given multiple large IPH -Continue to monitor neuro status, if no significant improvement in 48 hours will need goals of care discussion.  She was able to open her eyes for few minutes today, she was able to say few words today.  Will need goals of care discussion by palliative by end of week.   Diarrhea Dehydration Nausea vomiting: -GI pathogen negative Now on Ceftriaxone  and Flagyl .    Sepsis secondary to GI source versus UTI: Possible UTI present on arrival - Patient with fever, tachycardia, leukocytosis, source possible GI versus UTI. - Chest x-ray without acute finding.  C. difficile unable to test due to solid stool, COVID influenza RSV negative. -CT abdomen and pelvis diffuse colon wall thickening and area of the small bowel wall thickening as may be seen in enterocolitis of infectious or inflammatory etiology.  Possible esophagitis.  Slightly thick walled urinary bladder. - Completed ceftriaxone . - Still having low-grade fever repeat UA negative.  Will repeat chest x-ray - Urine culture no growth today, blood culture no growth today, respiratory panel completed negative. -Started on Flagyl  on 1/25-- resume ceftriaxone .  -Blood culture: 1/26: no growth to date.  Hypokalemia Hypophosphatemia - Replace as needed  Stress-induced hyperglycemia: Continue sliding scale insulin   History of vascular  dementia Anxiety Depression - Ammonia 19, Lexapro  was discontinued by  husband - Continue Namenda  and Aricept    Anemia of chronic illness: B12: 1200, folate more than 20, iron within normal limit Transfuse for hemoglobin less than 7  GERD: -Continue Pepcid   Risk for malnutrition Dysphagia Core trak Place 1/21st On tube feeding       Nutrition Problem: Moderate Malnutrition Etiology: social / environmental circumstances    Signs/Symptoms: mild fat depletion, moderate muscle depletion    Interventions: Refer to RD note for recommendations, Tube feeding  Estimated body mass index is 21.66 kg/m as calculated from the following:   Height as of this encounter: 4' 10 (1.473 m).   Weight as of this encounter: 47 kg.   DVT prophylaxis:heparin    Code Status: Discussed with Husband, in this circumstances Mrs. Chia would not want to be on life support or would not want heroic  measures like chest compression, defibrillation, or mechanical intubation.  I have updated her CODE STATUS to DNR/DNI.  -1/27: patient open eyes to voice today, said few words. Plan to reassess in 24 --48 in regards goals of care  Family Communication:Husband updated over phone 1/25, 1/27 Disposition Plan:  Status is: Inpatient Remains inpatient appropriate because: management of ICH    Consultants:  Neurology Neurosurgery CCM  Procedures:  none  Antimicrobials:    Subjective: She open eyes to voice today. Said few words. Not following command.   Objective: Vitals:   08/16/24 1942 08/17/24 0420 08/17/24 0441 08/17/24 0740  BP: 139/64 (!) 132/59  135/63  Pulse: 96 83  86  Resp: (!) 21 20  18   Temp: 98.9 F (37.2 C) 99 F (37.2 C)  98.4 F (36.9 C)  TempSrc: Axillary Oral    SpO2: 100% 100%  100%  Weight:   47 kg   Height:        Intake/Output Summary (Last 24 hours) at 08/17/2024 1052 Last data filed at 08/17/2024 0422 Gross per 24 hour  Intake --  Output 1350 ml  Net -1350 ml   Filed Weights   08/15/24 0500 08/16/24 0425 08/17/24 0441   Weight: 48.6 kg 48.7 kg 47 kg    Examination:  General exam: NAD, Frail.  Respiratory system: CTA Cardiovascular system: S 1, S 2 RRR Gastrointestinal system: BS present, soft, nt Central nervous system: open eyes to voice transiently, said few words. BL upper and lower extremities weakness. Not following command.   Data Reviewed: I have personally reviewed following labs and imaging studies  CBC: Recent Labs  Lab 08/10/24 2135 08/10/24 2148 08/11/24 0605 08/12/24 0502 08/14/24 0844 08/15/24 0726 08/17/24 0541  WBC 15.4*  --  13.5* 12.6* 11.7* 11.3* 10.5  NEUTROABS 13.6*  --   --  11.0*  --   --   --   HGB 10.2*   < > 10.3* 10.2* 9.2* 9.9* 9.8*  HCT 30.6*   < > 29.9* 29.9* 27.8* 29.4* 29.8*  MCV 95.6  --  94.9 95.2 96.5 95.1 96.8  PLT 268  --  246 224 227 234 273   < > = values in this interval not displayed.   Basic Metabolic Panel: Recent Labs  Lab 08/11/24 0605 08/12/24 0502 08/13/24 0246 08/14/24 0844 08/15/24 0726 08/16/24 0144 08/17/24 0541  NA 137 138 141 143 144 145 146*  K 3.8 3.2* 3.6 3.7 3.8 3.6 3.7  CL 99 104 107 109 107 109 109  CO2 26 23 25 25 28 28 30   GLUCOSE 91  139* 134* 221* 145* 130* 118*  BUN 17 24* 23 23 22  26* 27*  CREATININE 1.00 0.97 0.81 0.71 0.78 0.73 0.70  CALCIUM 9.1 8.7* 8.4* 8.6* 8.4* 8.4* 8.6*  MG 1.7 2.0 2.0 2.2 2.1  --   --   PHOS 3.4 2.4* 1.5* 1.5* 2.9  --   --    GFR: Estimated Creatinine Clearance: 33.8 mL/min (by C-G formula based on SCr of 0.7 mg/dL). Liver Function Tests: Recent Labs  Lab 08/11/24 0605  AST 30  ALT 11  ALKPHOS 54  BILITOT 0.3  PROT 6.6  ALBUMIN 3.8   No results for input(s): LIPASE, AMYLASE in the last 168 hours. Recent Labs  Lab 08/11/24 1045  AMMONIA 19   Coagulation Profile: Recent Labs  Lab 08/11/24 0014  INR 1.1   Cardiac Enzymes: No results for input(s): CKTOTAL, CKMB, CKMBINDEX, TROPONINI in the last 168 hours. BNP (last 3 results) No results for input(s):  PROBNP in the last 8760 hours. HbA1C: No results for input(s): HGBA1C in the last 72 hours.  CBG: Recent Labs  Lab 08/16/24 1643 08/16/24 1953 08/16/24 2354 08/17/24 0423 08/17/24 0740  GLUCAP 135* 132* 138* 132* 141*   Lipid Profile: No results for input(s): CHOL, HDL, LDLCALC, TRIG, CHOLHDL, LDLDIRECT in the last 72 hours.  Thyroid  Function Tests: No results for input(s): TSH, T4TOTAL, FREET4, T3FREE, THYROIDAB in the last 72 hours. Anemia Panel: No results for input(s): VITAMINB12, FOLATE, FERRITIN, TIBC, IRON, RETICCTPCT in the last 72 hours. Sepsis Labs: Recent Labs  Lab 08/10/24 2155 08/11/24 0028  LATICACIDVEN 2.0* 1.9    Recent Results (from the past 240 hours)  Culture, blood (routine x 2)     Status: None   Collection Time: 08/10/24  9:10 PM   Specimen: BLOOD  Result Value Ref Range Status   Specimen Description BLOOD RIGHT ANTECUBITAL  Final   Special Requests   Final    BOTTLES DRAWN AEROBIC AND ANAEROBIC Blood Culture results may not be optimal due to an inadequate volume of blood received in culture bottles   Culture   Final    NO GROWTH 5 DAYS Performed at [facility], [address], [place], [place] [address]    Report Status 08/15/2024 FINAL  Final  Resp panel by RT-PCR (RSV, Flu A&B, Covid) Anterior Nasal Swab     Status: None   Collection Time: 08/10/24  9:11 PM   Specimen: Anterior Nasal Swab  Result Value Ref Range Status   SARS Coronavirus 2 by RT PCR NEGATIVE NEGATIVE Final   Influenza A by PCR NEGATIVE NEGATIVE Final   Influenza B by PCR NEGATIVE NEGATIVE Final    Comment: (NOTE) The Xpert Xpress SARS-CoV-2/FLU/RSV plus assay is intended as an aid in the diagnosis of influenza from Nasopharyngeal swab specimens and should not be used as a sole basis for treatment. Nasal washings and aspirates are unacceptable for Xpert Xpress SARS-CoV-2/FLU/RSV testing.  Fact Sheet for  Patients: bloggercourse.com  Fact Sheet for Healthcare Providers: seriousbroker.it  This test is not yet approved or cleared by the [place]  FDA and has been authorized for detection and/or diagnosis of SARS-CoV-2 by FDA under an Emergency Use Authorization (EUA). This EUA will remain in effect (meaning this test can be used) for the duration of the COVID-19 declaration under Section 564(b)(1) of the Act, 21 U.S.C. section 360bbb-3(b)(1), unless the authorization is terminated or revoked.     Resp Syncytial Virus by PCR NEGATIVE NEGATIVE Final  Comment: (NOTE) Fact Sheet for Patients: bloggercourse.com  Fact Sheet for Healthcare Providers: seriousbroker.it  This test is not yet approved or cleared by the [place]  FDA and has been authorized for detection and/or diagnosis of SARS-CoV-2 by FDA under an Emergency Use Authorization (EUA). This EUA will remain in effect (meaning this test can be used) for the duration of the COVID-19 declaration under Section 564(b)(1) of the Act, 21 U.S.C. section 360bbb-3(b)(1), unless the authorization is terminated or revoked.  Performed at [facility], [address], [place], [place] [address]   MRSA Next Gen by PCR, Nasal     Status: None   Collection Time: 08/11/24  1:01 AM   Specimen: Nasal Mucosa; Nasal Swab  Result Value Ref Range Status   MRSA by PCR Next Gen NOT DETECTED NOT DETECTED Final    Comment: (NOTE) The GeneXpert MRSA Assay (FDA approved for NASAL specimens only), is one component of a comprehensive MRSA colonization surveillance program. It is not intended to diagnose MRSA infection nor to guide or monitor treatment for MRSA infections. Test performance is not FDA approved in patients less than 84 years old. Performed at [facility], [address] [place], [place] [address]   Gastrointestinal  Panel by PCR , Stool     Status: None   Collection Time: 08/11/24  7:31 AM   Specimen: STOOL  Result Value Ref Range Status   Campylobacter species NOT DETECTED NOT DETECTED Final   Plesimonas shigelloides NOT DETECTED NOT DETECTED Final   Salmonella species NOT DETECTED NOT DETECTED Final   Yersinia enterocolitica NOT DETECTED NOT DETECTED Final   Vibrio species NOT DETECTED NOT DETECTED Final   Vibrio cholerae NOT DETECTED NOT DETECTED Final   Enteroaggregative E coli (EAEC) NOT DETECTED NOT DETECTED Final   Enteropathogenic E coli (EPEC) NOT DETECTED NOT DETECTED Final   Enterotoxigenic E coli (ETEC) NOT DETECTED NOT DETECTED Final   Shiga like toxin producing E coli (STEC) NOT DETECTED NOT DETECTED Final   Shigella/Enteroinvasive E coli (EIEC) NOT DETECTED NOT DETECTED Final   Cryptosporidium NOT DETECTED NOT DETECTED Final   Cyclospora cayetanensis NOT DETECTED NOT DETECTED Final   Entamoeba histolytica NOT DETECTED NOT DETECTED Final   Giardia lamblia NOT DETECTED NOT DETECTED Final   Adenovirus F40/41 NOT DETECTED NOT DETECTED Final   Astrovirus NOT DETECTED NOT DETECTED Final   Norovirus GI/GII NOT DETECTED NOT DETECTED Final   Rotavirus A NOT DETECTED NOT DETECTED Final   Sapovirus (I, II, IV, and V) NOT DETECTED NOT DETECTED Final    Comment: Performed at [facility], [address] [place], [place] [address]  Respiratory (~20 pathogens) panel by PCR     Status: None   Collection Time: 08/11/24  9:14 AM   Specimen: Nasopharyngeal Swab; Respiratory  Result Value Ref Range Status   Adenovirus NOT DETECTED NOT DETECTED Final   Coronavirus 229E NOT DETECTED NOT DETECTED Final    Comment: (NOTE) The Coronavirus on the Respiratory Panel, DOES NOT test for the novel  Coronavirus (2019 nCoV)    Coronavirus HKU1 NOT DETECTED NOT DETECTED Final   Coronavirus NL63 NOT DETECTED NOT DETECTED Final   Coronavirus OC43 NOT DETECTED NOT DETECTED Final   Metapneumovirus NOT  DETECTED NOT DETECTED Final   Rhinovirus / Enterovirus NOT DETECTED NOT DETECTED Final   Influenza A NOT DETECTED NOT DETECTED Final   Influenza B NOT DETECTED NOT DETECTED Final   Parainfluenza Virus 1 NOT DETECTED NOT DETECTED  Final   Parainfluenza Virus 2 NOT DETECTED NOT DETECTED Final   Parainfluenza Virus 3 NOT DETECTED NOT DETECTED Final   Parainfluenza Virus 4 NOT DETECTED NOT DETECTED Final   Respiratory Syncytial Virus NOT DETECTED NOT DETECTED Final   Bordetella pertussis NOT DETECTED NOT DETECTED Final   Bordetella Parapertussis NOT DETECTED NOT DETECTED Final   Chlamydophila pneumoniae NOT DETECTED NOT DETECTED Final   Mycoplasma pneumoniae NOT DETECTED NOT DETECTED Final    Comment: Performed at [facility], [address], [place], [place] [address]  Urine Culture (for pregnant, neutropenic or urologic patients or patients with an indwelling urinary catheter)     Status: None   Collection Time: 08/11/24  9:46 AM   Specimen: Urine, Clean Catch  Result Value Ref Range Status   Specimen Description URINE, CLEAN CATCH  Final   Special Requests NONE  Final   Culture   Final    NO GROWTH Performed at [facility], [address] [place], [place] [address]    Report Status 08/12/2024 FINAL  Final  Culture, blood (routine x 2)     Status: None   Collection Time: 08/11/24 10:49 AM   Specimen: BLOOD LEFT ARM  Result Value Ref Range Status   Specimen Description BLOOD LEFT ARM  Final   Special Requests   Final    BOTTLES DRAWN AEROBIC ONLY Blood Culture results may not be optimal due to an inadequate volume of blood received in culture bottles   Culture   Final    NO GROWTH 5 DAYS Performed at [facility], [address], [place], [place] [address]    Report Status 08/16/2024 FINAL  Final  Culture, blood (Routine X 2) w Reflex to ID Panel     Status: None (Preliminary result)   Collection Time: 08/15/24  3:58 PM   Specimen: BLOOD  Result Value Ref  Range Status   Specimen Description BLOOD LEFT ANTECUBITAL  Final   Special Requests   Final    BOTTLES DRAWN AEROBIC AND ANAEROBIC Blood Culture results may not be optimal due to an inadequate volume of blood received in culture bottles   Culture   Final    NO GROWTH 2 DAYS Performed at [facility], [address] [place], [place] [address]    Report Status PENDING  Incomplete         Radiology Studies: No results found.       Scheduled Meds:  amLODipine   5 mg Per Tube Daily   donepezil   10 mg Per Tube QHS   feeding supplement (PROSource TF20)  60 mL Per Tube Daily   free water   200 mL Per Tube Q4H   heparin  injection (subcutaneous)  5,000 Units Subcutaneous Q8H   insulin  aspart  0-9 Units Subcutaneous Q4H   levETIRAcetam   500 mg Per Tube BID   memantine   5 mg Per Tube BID   multivitamin with minerals  1 tablet Per Tube Daily   mouth rinse  15 mL Mouth Rinse 4 times per day   pantoprazole  (PROTONIX ) IV  40 mg Intravenous Q24H   Continuous Infusions:  cefTRIAXone  (ROCEPHIN )  IV 2 g (08/16/24 1309)   feeding supplement (OSMOLITE 1.5 CAL) 1,000 mL (08/17/24 0402)   metronidazole  500 mg (08/17/24 0210)     LOS: 7 days    Time spent: 35 Minutes    Winslow Ederer A Azharia Surratt, MD [facility]   If 7PM-7AM, please contact night-coverage [email or web address]  08/17/2024, 10:52 AM   "

## 2024-08-18 DIAGNOSIS — A419 Sepsis, unspecified organism: Secondary | ICD-10-CM | POA: Diagnosis not present

## 2024-08-18 DIAGNOSIS — I629 Nontraumatic intracranial hemorrhage, unspecified: Secondary | ICD-10-CM | POA: Diagnosis not present

## 2024-08-18 LAB — GLUCOSE, CAPILLARY
Glucose-Capillary: 133 mg/dL — ABNORMAL HIGH (ref 70–99)
Glucose-Capillary: 125 mg/dL — ABNORMAL HIGH (ref 70–99)
Glucose-Capillary: 121 mg/dL — ABNORMAL HIGH (ref 70–99)
Glucose-Capillary: 124 mg/dL — ABNORMAL HIGH (ref 70–99)
Glucose-Capillary: 119 mg/dL — ABNORMAL HIGH (ref 70–99)

## 2024-08-18 NOTE — Care Management Important Message (Signed)
 Important Message  Patient Details  Name: [name] MRN: [id number] Date of Birth: 1941-03-15   Important Message Given:  Yes - Medicare IM  IM given on 08/17/2024   Claretta Deed 08/18/2024, 8:16 AM

## 2024-08-18 NOTE — Progress Notes (Signed)
 " PROGRESS NOTE    [name]  FMW:[id number] DOB: 02-24-1941 DOA: 08/10/2024 PCP: Patti Mliss LABOR, FNP   Brief Narrative: 84 year old with past medical history significant for vascular dementia, anemia of chronic illness, GERD, bilateral ureteral stents, prior TIA presents with nausea vomiting and diarrhea for 3 days and a fall the night of admission.  It seems that patient hit her head.  She was on aspirin  prior to admission.  Patient was obtunded on admission admitted by the critical care team on 1/21st.  Patient was found to have multiple large acute intraparenchymal hemorrhage left parietal and occipital lobes.  Neurology and neurosurgery consulted.  Definitive treatment deferred 2 to 3 months if patient recovers.  Diagnostic angiogram may be consider if recovers.  Patient was also found to have diarrhea, dehydration and metabolic acidosis.  Sepsis secondary to GI source versus UTI.  Patient was transferred to [facility] starting 1/25. Patient continued to have fever, which improved on 1/27. She was started on ceftriaxone  and flagyl .   Assessment & Plan:  Multiple large acute intraparenchymal hemorrhage centered in the left parietal and occipital lobe/SAH/SDH extension Could be related to AVM versus traumatic versus hemorrhagic conversion. -CT head 1/20: Multiple large acute IPH left parietal lobe and left occipital lobe, small volume extra axial  extension of hemorrhage.  Possible Intraventricular  extension into left occipital horn. - Repeated CT head 1/21st: Stable extensive left posterior hemisphere intra-axial hemorrhage, stable small regional subarachnoid/subdural space extension of blood.  - MRI brain: Left occipital posterior left temporal lobe hemorrhage, left parietal lobe hemorrhage with layering blood product hyperemia. - CTA consistent with AVM Patient was seen by neurology and neurosurgery.  Plan for further investigations including cerebral angiogram depending on patient's  improvement. Patient remains on Keppra . -Spot EEG 1/20-second cortical dysfunction generalized cerebral dysfunction no seizures. -LDL 70 A1c 5.4 echo ejection fraction 65%, moderate dilation of the left and right atrial size. -Was on aspirin  prior to admission now on not antithrombotic given multiple large IPH Palliative care is following.  Ongoing conversations with family regarding goals of care. No significant improvement noted in neurological status.  Will continue to monitor.  Concern for colitis Nausea vomiting: GI pathogen negative Now on Ceftriaxone  and Flagyl .  Has a Flexi-Seal.  Monitor stool output.  Sepsis secondary to GI source versus UTI: Possible UTI present on arrival - Patient with fever, tachycardia, leukocytosis, source possible GI versus UTI. - Chest x-ray without acute finding.   C. difficile unable to test due to solid stool, COVID influenza RSV negative. -CT abdomen and pelvis diffuse colon wall thickening and area of the small bowel wall thickening as may be seen in enterocolitis of infectious or inflammatory etiology.  Possible esophagitis.  Slightly thick walled urinary bladder. - Urine culture no growth, blood culture no growth, respiratory panel completed negative. Chest x-ray from 1/25 suggested left-sided small pleural effusion as well as atelectasis. Patient remains on ceftriaxone  and metronidazole . Has been afebrile for the last 24 hours.  WBC was normal yesterday.  Will recheck labs in the morning. Blood cultures from 1/26 negative so far.  Hypokalemia Hypophosphatemia - Replace as needed  Stress-induced hyperglycemia: Continue sliding scale insulin   History of vascular dementia Anxiety Depression - Ammonia 19, Lexapro  was discontinued by husband - Continue Namenda  and Aricept   Anemia of chronic illness: B12: 1200, folate more than 20, iron within normal limit Transfuse for hemoglobin less than 7  GERD: Continue Pepcid   Moderate protein  calorie malnutrition  Dysphagia [place]  1/21st On tube feeding  Goals of care Palliative care is following.  Will need to see if there is any neurological recovery before pursuing further interventions.  DVT prophylaxis:heparin    Code Status: DNR Family Communication: Husband being updated periodically. Disposition Plan: To be determined.  SNF is being considered.   Consultants:  Neurology Neurosurgery CCM  Procedures:  none   Subjective: Patient with eyes closed.  Mumbling but does not follow commands.  Cannot make out what she is saying.  Moving her extremities.    Objective: Vitals:   08/17/24 2036 08/18/24 0026 08/18/24 0500 08/18/24 0846  BP: 139/64 134/62 (!) 131/56 (!) 138/56  Pulse: 88 85 84 72  Resp:      Temp: 98.2 F (36.8 C) 97.8 F (36.6 C) 98.6 F (37 C) 98.3 F (36.8 C)  TempSrc: Oral Oral Axillary Axillary  SpO2: 100% 100% 98% 99%  Weight:      Height:        Intake/Output Summary (Last 24 hours) at 08/18/2024 0851 Last data filed at 08/18/2024 0500 Gross per 24 hour  Intake 3512.42 ml  Output 850 ml  Net 2662.42 ml   Filed Weights   08/15/24 0500 08/16/24 0425 08/17/24 0441  Weight: 48.6 kg 48.7 kg 47 kg    Examination:  General appearance: Eyes are closed. NGT was noted. Resp: Clear to auscultation bilaterally.  Normal effort Cardio: S1-S2 is normal regular.  No S3-S4.  No rubs murmurs or bruit GI: Abdomen is soft.  Nontender nondistended.  Bowel sounds are present normal.  No masses organomegaly Extremities: Noted to be moving her extremities.  Data Reviewed: I have personally reviewed following labs and reports of imaging studies  CBC: Recent Labs  Lab 08/12/24 0502 08/14/24 0844 08/15/24 0726 08/17/24 0541  WBC 12.6* 11.7* 11.3* 10.5  NEUTROABS 11.0*  --   --   --   HGB 10.2* 9.2* 9.9* 9.8*  HCT 29.9* 27.8* 29.4* 29.8*  MCV 95.2 96.5 95.1 96.8  PLT 224 227 234 273   Basic Metabolic Panel: Recent Labs  Lab  08/12/24 0502 08/13/24 0246 08/14/24 0844 08/15/24 0726 08/16/24 0144 08/17/24 0541  NA 138 141 143 144 145 146*  K 3.2* 3.6 3.7 3.8 3.6 3.7  CL 104 107 109 107 109 109  CO2 23 25 25 28 28 30   GLUCOSE 139* 134* 221* 145* 130* 118*  BUN 24* 23 23 22  26* 27*  CREATININE 0.97 0.81 0.71 0.78 0.73 0.70  CALCIUM 8.7* 8.4* 8.6* 8.4* 8.4* 8.6*  MG 2.0 2.0 2.2 2.1  --   --   PHOS 2.4* 1.5* 1.5* 2.9  --   --     Recent Labs  Lab 08/11/24 1045  AMMONIA 19     CBG: Recent Labs  Lab 08/17/24 1542 08/17/24 2015 08/17/24 2348 08/18/24 0338 08/18/24 0752  GLUCAP 153* 123* 124* 133* 125*    Recent Results (from the past 240 hours)  Culture, blood (routine x 2)     Status: None   Collection Time: 08/10/24  9:10 PM   Specimen: BLOOD  Result Value Ref Range Status   Specimen Description BLOOD RIGHT ANTECUBITAL  Final   Special Requests   Final    BOTTLES DRAWN AEROBIC AND ANAEROBIC Blood Culture results may not be optimal due to an inadequate volume of blood received in culture bottles   Culture   Final    NO GROWTH 5 DAYS Performed at [facility], [address]  [place], [place] [address]    Report Status 08/15/2024 FINAL  Final  Resp panel by RT-PCR (RSV, Flu A&B, Covid) Anterior Nasal Swab     Status: None   Collection Time: 08/10/24  9:11 PM   Specimen: Anterior Nasal Swab  Result Value Ref Range Status   SARS Coronavirus 2 by RT PCR NEGATIVE NEGATIVE Final   Influenza A by PCR NEGATIVE NEGATIVE Final   Influenza B by PCR NEGATIVE NEGATIVE Final    Comment: (NOTE) The Xpert Xpress SARS-CoV-2/FLU/RSV plus assay is intended as an aid in the diagnosis of influenza from Nasopharyngeal swab specimens and should not be used as a sole basis for treatment. Nasal washings and aspirates are unacceptable for Xpert Xpress SARS-CoV-2/FLU/RSV testing.  Fact Sheet for Patients: bloggercourse.com  Fact Sheet for Healthcare  Providers: seriousbroker.it  This test is not yet approved or cleared by the [place]  FDA and has been authorized for detection and/or diagnosis of SARS-CoV-2 by FDA under an Emergency Use Authorization (EUA). This EUA will remain in effect (meaning this test can be used) for the duration of the COVID-19 declaration under Section 564(b)(1) of the Act, 21 U.S.C. section 360bbb-3(b)(1), unless the authorization is terminated or revoked.     Resp Syncytial Virus by PCR NEGATIVE NEGATIVE Final    Comment: (NOTE) Fact Sheet for Patients: bloggercourse.com  Fact Sheet for Healthcare Providers: seriousbroker.it  This test is not yet approved or cleared by the [place]  FDA and has been authorized for detection and/or diagnosis of SARS-CoV-2 by FDA under an Emergency Use Authorization (EUA). This EUA will remain in effect (meaning this test can be used) for the duration of the COVID-19 declaration under Section 564(b)(1) of the Act, 21 U.S.C. section 360bbb-3(b)(1), unless the authorization is terminated or revoked.  Performed at [facility], [address], [place], [place] [address]   MRSA Next Gen by PCR, Nasal     Status: None   Collection Time: 08/11/24  1:01 AM   Specimen: Nasal Mucosa; Nasal Swab  Result Value Ref Range Status   MRSA by PCR Next Gen NOT DETECTED NOT DETECTED Final    Comment: (NOTE) The GeneXpert MRSA Assay (FDA approved for NASAL specimens only), is one component of a comprehensive MRSA colonization surveillance program. It is not intended to diagnose MRSA infection nor to guide or monitor treatment for MRSA infections. Test performance is not FDA approved in patients less than 60 years old. Performed at [facility], [address] [place], [place] [address]   Gastrointestinal Panel by PCR , Stool     Status: None   Collection Time: 08/11/24  7:31 AM    Specimen: STOOL  Result Value Ref Range Status   Campylobacter species NOT DETECTED NOT DETECTED Final   Plesimonas shigelloides NOT DETECTED NOT DETECTED Final   Salmonella species NOT DETECTED NOT DETECTED Final   Yersinia enterocolitica NOT DETECTED NOT DETECTED Final   Vibrio species NOT DETECTED NOT DETECTED Final   Vibrio cholerae NOT DETECTED NOT DETECTED Final   Enteroaggregative E coli (EAEC) NOT DETECTED NOT DETECTED Final   Enteropathogenic E coli (EPEC) NOT DETECTED NOT DETECTED Final   Enterotoxigenic E coli (ETEC) NOT DETECTED NOT DETECTED Final   Shiga like toxin producing E coli (STEC) NOT DETECTED NOT DETECTED Final   Shigella/Enteroinvasive E coli (EIEC) NOT DETECTED NOT DETECTED Final   Cryptosporidium NOT DETECTED NOT DETECTED Final   Cyclospora cayetanensis NOT DETECTED NOT DETECTED Final   Entamoeba histolytica  NOT DETECTED NOT DETECTED Final   Giardia lamblia NOT DETECTED NOT DETECTED Final   Adenovirus F40/41 NOT DETECTED NOT DETECTED Final   Astrovirus NOT DETECTED NOT DETECTED Final   Norovirus GI/GII NOT DETECTED NOT DETECTED Final   Rotavirus A NOT DETECTED NOT DETECTED Final   Sapovirus (I, II, IV, and V) NOT DETECTED NOT DETECTED Final    Comment: Performed at [facility], [address] [place], [place] [address]  Respiratory (~20 pathogens) panel by PCR     Status: None   Collection Time: 08/11/24  9:14 AM   Specimen: Nasopharyngeal Swab; Respiratory  Result Value Ref Range Status   Adenovirus NOT DETECTED NOT DETECTED Final   Coronavirus 229E NOT DETECTED NOT DETECTED Final    Comment: (NOTE) The Coronavirus on the Respiratory Panel, DOES NOT test for the novel  Coronavirus (2019 nCoV)    Coronavirus HKU1 NOT DETECTED NOT DETECTED Final   Coronavirus NL63 NOT DETECTED NOT DETECTED Final   Coronavirus OC43 NOT DETECTED NOT DETECTED Final   Metapneumovirus NOT DETECTED NOT DETECTED Final   Rhinovirus / Enterovirus NOT DETECTED NOT  DETECTED Final   Influenza A NOT DETECTED NOT DETECTED Final   Influenza B NOT DETECTED NOT DETECTED Final   Parainfluenza Virus 1 NOT DETECTED NOT DETECTED Final   Parainfluenza Virus 2 NOT DETECTED NOT DETECTED Final   Parainfluenza Virus 3 NOT DETECTED NOT DETECTED Final   Parainfluenza Virus 4 NOT DETECTED NOT DETECTED Final   Respiratory Syncytial Virus NOT DETECTED NOT DETECTED Final   Bordetella pertussis NOT DETECTED NOT DETECTED Final   Bordetella Parapertussis NOT DETECTED NOT DETECTED Final   Chlamydophila pneumoniae NOT DETECTED NOT DETECTED Final   Mycoplasma pneumoniae NOT DETECTED NOT DETECTED Final    Comment: Performed at [facility], [address] [place], [place] [address]  Urine Culture (for pregnant, neutropenic or urologic patients or patients with an indwelling urinary catheter)     Status: None   Collection Time: 08/11/24  9:46 AM   Specimen: Urine, Clean Catch  Result Value Ref Range Status   Specimen Description URINE, CLEAN CATCH  Final   Special Requests NONE  Final   Culture   Final    NO GROWTH Performed at [facility], [address] [place], [place] [address]    Report Status 08/12/2024 FINAL  Final  Culture, blood (routine x 2)     Status: None   Collection Time: 08/11/24 10:49 AM   Specimen: BLOOD LEFT ARM  Result Value Ref Range Status   Specimen Description BLOOD LEFT ARM  Final   Special Requests   Final    BOTTLES DRAWN AEROBIC ONLY Blood Culture results may not be optimal due to an inadequate volume of blood received in culture bottles   Culture   Final    NO GROWTH 5 DAYS Performed at [facility], [address] [place], [place] [address]    Report Status 08/16/2024 FINAL  Final  Culture, blood (Routine X 2) w Reflex to ID Panel     Status: None (Preliminary result)   Collection Time: 08/15/24  3:58 PM   Specimen: BLOOD  Result Value Ref Range Status   Specimen Description BLOOD LEFT ANTECUBITAL  Final   Special  Requests   Final    BOTTLES DRAWN AEROBIC AND ANAEROBIC Blood Culture results may not be optimal due to an inadequate volume of blood received in culture bottles   Culture  Final    NO GROWTH 3 DAYS Performed at [facility], [address], [place], [place] [address]    Report Status PENDING  Incomplete      Radiology Studies: No results found.   Scheduled Meds:  amLODipine   5 mg Per Tube Daily   donepezil   10 mg Per Tube QHS   feeding supplement (PROSource TF20)  60 mL Per Tube Daily   free water   200 mL Per Tube Q4H   heparin  injection (subcutaneous)  5,000 Units Subcutaneous Q8H   insulin  aspart  0-9 Units Subcutaneous Q4H   levETIRAcetam   500 mg Per Tube BID   memantine   5 mg Per Tube BID   multivitamin with minerals  1 tablet Per Tube Daily   mouth rinse  15 mL Mouth Rinse 4 times per day   pantoprazole  (PROTONIX ) IV  40 mg Intravenous Q24H   Continuous Infusions:  cefTRIAXone  (ROCEPHIN )  IV Stopped (08/17/24 1613)   feeding supplement (OSMOLITE 1.5 CAL) 35 mL/hr at 08/18/24 0500   metronidazole  Stopped (08/18/24 0431)     LOS: 8 days    Joette Pebbles, MD [facility]   If 7PM-7AM, please contact night-coverage [email or web address]  08/18/2024, 8:51 AM   "

## 2024-08-18 NOTE — Progress Notes (Signed)
" °  Daily Progress Note   Date: 08/18/2024   Patient Name: [name]  DOB: 16-Jul-1941  MRN: [id number]  Age / Sex: 84 y.o., female  Attending Physician: Verdene Purchase, MD Primary Care Physician: Patti Mliss LABOR, FNP Admit Date: 08/10/2024 Length of Stay: 8 days  Reason for Follow-up: {Reason for Consult:23484}  Past Medical History:  Diagnosis Date   Anemia    Anxiety    Arthritis    Chronic kidney disease    COPD (chronic obstructive pulmonary disease) (HCC)    Dementia (HCC)    vascular dementia   GERD (gastroesophageal reflux disease)    History of kidney stones    Macular degeneration of left eye    Wet and gets injections q monthly   Smoker    1- 1 1/2 ppd  per Husband   Stroke (HCC)    TIAs    Subjective:   Subjective: Chart Reviewed. Updates received. Patient Assessed. Created space and opportunity for patient  and family to explore thoughts and feelings regarding current medical situation.  Today's Discussion: Today before meeting with the patient/family, I reviewed the chart notes including ***. I also reviewed vital signs, nursing flowsheets, medication administrations record, labs, and imaging. Labs reviewed include ***.  ***  Review of Systems  Objective:   Primary Diagnoses: Present on Admission: **None**   Vital Signs:  BP 135/61 (BP Location: Right Arm)   Pulse 84   Temp 97.7 F (36.5 C) (Axillary)   Resp 20   Ht 4' 10 (1.473 m)   Wt 47 kg   SpO2 100%   BMI 21.66 kg/m   Physical Exam  Palliative Assessment/Data: ***   Existing Vynca/ACP Documentation: ***  Advanced Care Planning:   Existing Vynca/ACP Documentation: ***  Primary Decision Maker: {Primary Decision Fjxzm:78612}  Pertinent diagnosis: ***  The patient and/or family consented to a voluntary Advance Care Planning Conversation in person/over the phone***. Individuals present for the conversation: ***  Summary of the conversation: ***  Outcome of the  conversations and/or documents completed: ***  I spent *** minutes providing separately identifiable ACP services with the patient and/or surrogate decision maker in a voluntary, in-person conversation discussing the patient's wishes and goals as detailed in the above note.  Assessment & Plan:   HPI/Patient Profile:  ***  SUMMARY OF RECOMMENDATIONS   ***  Symptom Management:  ***  Code Status: {Updated Palliative Code Status:33307}  Prognosis: {Palliative Care Prognosis:23504}  Discharge Planning: {Palliative dispostion:23505}  Discussed with: ***  Thank you for allowing us  to participate in the care of ARELYS GLASSCO PMT will continue to support holistically.  Time Total: ***  Detailed review of medical records (labs, imaging, vital signs), medically appropriate exam, discussed with treatment team, counseling and education to patient, family, & staff, documenting clinical information, medication management, coordination of care  Camellia Kays, NP Palliative Medicine Team  Team Phone # [phone] (Nights/Weekends)  03/20/2021, 8:17 AM  "

## 2024-08-18 NOTE — Plan of Care (Signed)

## 2024-08-18 NOTE — Plan of Care (Signed)
 Seen by palliative today. Spouse request updates per phone today. Not able to make it into hospital today.    Problem: Education: Goal: Ability to describe self-care measures that may prevent or decrease complications (Diabetes Survival Skills Education) will improve Outcome: Progressing   Problem: Coping: Goal: Ability to adjust to condition or change in health will improve Outcome: Progressing   Problem: Nutritional: Goal: Maintenance of adequate nutrition will improve Outcome: Progressing   Problem: Pain Managment: Goal: General experience of comfort will improve and/or be controlled Outcome: Progressing   Problem: Safety: Goal: Ability to remain free from injury will improve Outcome: Progressing   Problem: Skin Integrity: Goal: Risk for impaired skin integrity will decrease Outcome: Progressing

## 2024-08-19 DIAGNOSIS — A419 Sepsis, unspecified organism: Secondary | ICD-10-CM | POA: Diagnosis not present

## 2024-08-19 DIAGNOSIS — I629 Nontraumatic intracranial hemorrhage, unspecified: Secondary | ICD-10-CM | POA: Diagnosis not present

## 2024-08-19 LAB — GLUCOSE, CAPILLARY
Glucose-Capillary: 124 mg/dL — ABNORMAL HIGH (ref 70–99)
Glucose-Capillary: 124 mg/dL — ABNORMAL HIGH (ref 70–99)
Glucose-Capillary: 125 mg/dL — ABNORMAL HIGH (ref 70–99)
Glucose-Capillary: 130 mg/dL — ABNORMAL HIGH (ref 70–99)
Glucose-Capillary: 132 mg/dL — ABNORMAL HIGH (ref 70–99)
Glucose-Capillary: 139 mg/dL — ABNORMAL HIGH (ref 70–99)
Glucose-Capillary: 152 mg/dL — ABNORMAL HIGH (ref 70–99)

## 2024-08-19 LAB — COMPREHENSIVE METABOLIC PANEL WITH GFR
ALT: 19 U/L (ref 0–44)
AST: 26 U/L (ref 15–41)
Albumin: 2.9 g/dL — ABNORMAL LOW (ref 3.5–5.0)
Alkaline Phosphatase: 39 U/L (ref 38–126)
Anion gap: 11 (ref 5–15)
BUN: 25 mg/dL — ABNORMAL HIGH (ref 8–23)
CO2: 25 mmol/L (ref 22–32)
Calcium: 8.3 mg/dL — ABNORMAL LOW (ref 8.9–10.3)
Chloride: 103 mmol/L (ref 98–111)
Creatinine, Ser: 0.58 mg/dL (ref 0.44–1.00)
GFR, Estimated: >60 mL/min
Glucose, Bld: 122 mg/dL — ABNORMAL HIGH (ref 70–99)
Potassium: 3.6 mmol/L (ref 3.5–5.1)
Sodium: 139 mmol/L (ref 135–145)
Total Bilirubin: <0.2 mg/dL (ref 0.0–1.2)
Total Protein: 5.7 g/dL — ABNORMAL LOW (ref 6.5–8.1)

## 2024-08-19 LAB — CBC
HCT: 30.1 % — ABNORMAL LOW (ref 36.0–46.0)
Hemoglobin: 9.9 g/dL — ABNORMAL LOW (ref 12.0–15.0)
MCH: 31.4 pg (ref 26.0–34.0)
MCHC: 32.9 g/dL (ref 30.0–36.0)
MCV: 95.6 fL (ref 80.0–100.0)
Platelets: 317 10*3/uL (ref 150–400)
RBC: 3.15 MIL/uL — ABNORMAL LOW (ref 3.87–5.11)
RDW: 13.6 % (ref 11.5–15.5)
WBC: 11.5 10*3/uL — ABNORMAL HIGH (ref 4.0–10.5)
nRBC: 0 % (ref 0.0–0.2)

## 2024-08-19 LAB — PHOSPHORUS: Phosphorus: 2.6 mg/dL (ref 2.5–4.6)

## 2024-08-19 LAB — MAGNESIUM: Magnesium: 2 mg/dL (ref 1.7–2.4)

## 2024-08-19 MED ORDER — POTASSIUM CHLORIDE 20 MEQ PO PACK
40.0000 meq | PACK | Freq: Once | ORAL | Status: AC
  Administered 2024-08-19: 40 meq
  Filled 2024-08-19: qty 2

## 2024-08-19 NOTE — Progress Notes (Signed)
 " PROGRESS NOTE    [name]  FMW:[id number] DOB: 15-Apr-1941 DOA: 08/10/2024 PCP: Patti Mliss LABOR, FNP   Brief Narrative: 84 year old with past medical history significant for vascular dementia, anemia of chronic illness, GERD, bilateral ureteral stents, prior TIA presents with nausea vomiting and diarrhea for 3 days and a fall the night of admission.  It seems that patient hit her head.  She was on aspirin  prior to admission.  Patient was obtunded on admission admitted by the critical care team on 1/21st.  Patient was found to have multiple large acute intraparenchymal hemorrhage left parietal and occipital lobes.  Neurology and neurosurgery consulted.  Definitive treatment deferred 2 to 3 months if patient recovers.  Diagnostic angiogram may be consider if recovers.  Patient was also found to have diarrhea, dehydration and metabolic acidosis.  Sepsis secondary to GI source versus UTI.  Patient was transferred to [facility] starting 1/25. Patient continued to have fever, which improved on 1/27. She was started on ceftriaxone  and flagyl .   Assessment & Plan:  Multiple large acute intraparenchymal hemorrhage centered in the left parietal and occipital lobe/SAH/SDH extension Could be related to AVM versus traumatic versus hemorrhagic conversion. -CT head 1/20: Multiple large acute IPH left parietal lobe and left occipital lobe, small volume extra axial  extension of hemorrhage.  Possible Intraventricular  extension into left occipital horn. - Repeated CT head 1/21st: Stable extensive left posterior hemisphere intra-axial hemorrhage, stable small regional subarachnoid/subdural space extension of blood.  - MRI brain: Left occipital posterior left temporal lobe hemorrhage, left parietal lobe hemorrhage with layering blood product hyperemia. - CTA consistent with AVM Patient was seen by neurology and neurosurgery.  Plan for further investigations including cerebral angiogram depending on patient's  improvement. Patient remains on Keppra . -Spot EEG 1/20-second cortical dysfunction generalized cerebral dysfunction no seizures. -LDL 70 A1c 5.4 echo ejection fraction 65%, moderate dilation of the left and right atrial size. -Was on aspirin  prior to admission now on not antithrombotic given multiple large IPH Palliative care is following.  Ongoing conversations with family regarding goals of care. No significant improvement noted in patient's neurological status.  Continue to monitor.  Husband was updated yesterday.    Concern for colitis/Nausea vomiting GI pathogen negative Now on Ceftriaxone  and Flagyl .   Has a Flexi-Seal.  Monitor stool output.  May consider discontinuing antibiotics if stool output decreases. WBC noted to be 11.5.  Patient remains afebrile.  No further episodes of nausea and vomiting reported.  Sepsis secondary to GI source versus UTI: Possible UTI present on arrival - Patient initially presented with fever, tachycardia, leukocytosis, source possible GI versus UTI. - Chest x-ray without acute finding.   C. difficile unable to test due to solid stool, COVID influenza RSV negative. -CT abdomen and pelvis diffuse colon wall thickening and area of the small bowel wall thickening as may be seen in enterocolitis of infectious or inflammatory etiology.  Possible esophagitis.  Slightly thick walled urinary bladder. - Urine culture no growth, blood culture no growth, respiratory panel completed negative. Chest x-ray from 1/25 suggested left-sided small pleural effusion as well as atelectasis. Patient remains on ceftriaxone  and metronidazole . Blood cultures from 1/26 negative so far.  Hypokalemia Hypophosphatemia - Replace as indicated  Hyperglycemia: Likely secondary to tube feedings.  Continue sliding scale insulin   History of vascular dementia Anxiety Depression - Ammonia 19, Lexapro  was discontinued by husband - Continue Namenda  and Aricept   Anemia of chronic  illness: B12: 1200, folate more than 20, iron within  normal limit Transfuse for hemoglobin less than 7  GERD: Continue Pepcid   Moderate protein calorie malnutrition  Dysphagia [place] 1/21 On tube feedings.  Goals of care Palliative care is following.  Will need to see if there is any neurological recovery before pursuing further interventions.  DVT prophylaxis:heparin    Code Status: DNR Family Communication: Husband being updated periodically. Disposition Plan: To be determined.  SNF is being considered.   Consultants:  Neurology Neurosurgery CCM  Procedures:  none   Subjective: Patient opened her eyes when her name was called.  Does not really answer questions appropriately.  Generally restless.  Moving her extremities but does not follow commands.    Objective: Vitals:   08/19/24 0020 08/19/24 0349 08/19/24 0353 08/19/24 0801  BP: 129/61 (!) 127/51  (!) 130/50  Pulse: 82 65  74  Resp: 16 19  16   Temp: 99.7 F (37.6 C) 98.2 F (36.8 C)  98.4 F (36.9 C)  TempSrc: Axillary Axillary    SpO2: 98% 94%  99%  Weight:   51.4 kg   Height:        Intake/Output Summary (Last 24 hours) at 08/19/2024 0858 Last data filed at 08/19/2024 0800 Gross per 24 hour  Intake 2240 ml  Output 1350 ml  Net 890 ml   Filed Weights   08/16/24 0425 08/17/24 0441 08/19/24 0353  Weight: 48.7 kg 47 kg 51.4 kg    Examination:  General appearance: Eyes are open.  Distracted.  In no distress Resp: Clear to auscultation bilaterally.  Normal effort Cardio: S1-S2 is normal regular.  No S3-S4.  No rubs murmurs or bruit GI: Abdomen is soft.  Nontender nondistended.  Bowel sounds are present normal.  No masses organomegaly Extremities: Noted to be moving extremities. No obvious focal neurological deficits.  Data Reviewed: I have personally reviewed following labs and reports of imaging studies  CBC: Recent Labs  Lab 08/14/24 0844 08/15/24 0726 08/17/24 0541 08/19/24 0104   WBC 11.7* 11.3* 10.5 11.5*  HGB 9.2* 9.9* 9.8* 9.9*  HCT 27.8* 29.4* 29.8* 30.1*  MCV 96.5 95.1 96.8 95.6  PLT 227 234 273 317   Basic Metabolic Panel: Recent Labs  Lab 08/13/24 0246 08/14/24 0844 08/15/24 0726 08/16/24 0144 08/17/24 0541 08/19/24 0104  NA 141 143 144 145 146* 139  K 3.6 3.7 3.8 3.6 3.7 3.6  CL 107 109 107 109 109 103  CO2 25 25 28 28 30 25   GLUCOSE 134* 221* 145* 130* 118* 122*  BUN 23 23 22  26* 27* 25*  CREATININE 0.81 0.71 0.78 0.73 0.70 0.58  CALCIUM 8.4* 8.6* 8.4* 8.4* 8.6* 8.3*  MG 2.0 2.2 2.1  --   --  2.0  PHOS 1.5* 1.5* 2.9  --   --   --     CBG: Recent Labs  Lab 08/18/24 1555 08/18/24 2011 08/19/24 0043 08/19/24 0346 08/19/24 0803  GLUCAP 124* 119* 130* 124* 152*    Recent Results (from the past 240 hours)  Culture, blood (routine x 2)     Status: None   Collection Time: 08/10/24  9:10 PM   Specimen: BLOOD  Result Value Ref Range Status   Specimen Description BLOOD RIGHT ANTECUBITAL  Final   Special Requests   Final    BOTTLES DRAWN AEROBIC AND ANAEROBIC Blood Culture results may not be optimal due to an inadequate volume of blood received in culture bottles   Culture   Final    NO GROWTH 5 DAYS Performed at  [facility], [address] [place], [place] [address]    Report Status 08/15/2024 FINAL  Final  Resp panel by RT-PCR (RSV, Flu A&B, Covid) Anterior Nasal Swab     Status: None   Collection Time: 08/10/24  9:11 PM   Specimen: Anterior Nasal Swab  Result Value Ref Range Status   SARS Coronavirus 2 by RT PCR NEGATIVE NEGATIVE Final   Influenza A by PCR NEGATIVE NEGATIVE Final   Influenza B by PCR NEGATIVE NEGATIVE Final    Comment: (NOTE) The Xpert Xpress SARS-CoV-2/FLU/RSV plus assay is intended as an aid in the diagnosis of influenza from Nasopharyngeal swab specimens and should not be used as a sole basis for treatment. Nasal washings and aspirates are unacceptable for Xpert Xpress  SARS-CoV-2/FLU/RSV testing.  Fact Sheet for Patients: bloggercourse.com  Fact Sheet for Healthcare Providers: seriousbroker.it  This test is not yet approved or cleared by the [place]  FDA and has been authorized for detection and/or diagnosis of SARS-CoV-2 by FDA under an Emergency Use Authorization (EUA). This EUA will remain in effect (meaning this test can be used) for the duration of the COVID-19 declaration under Section 564(b)(1) of the Act, 21 U.S.C. section 360bbb-3(b)(1), unless the authorization is terminated or revoked.     Resp Syncytial Virus by PCR NEGATIVE NEGATIVE Final    Comment: (NOTE) Fact Sheet for Patients: bloggercourse.com  Fact Sheet for Healthcare Providers: seriousbroker.it  This test is not yet approved or cleared by the [place]  FDA and has been authorized for detection and/or diagnosis of SARS-CoV-2 by FDA under an Emergency Use Authorization (EUA). This EUA will remain in effect (meaning this test can be used) for the duration of the COVID-19 declaration under Section 564(b)(1) of the Act, 21 U.S.C. section 360bbb-3(b)(1), unless the authorization is terminated or revoked.  Performed at [facility], [address], [place], [place] [address]   MRSA Next Gen by PCR, Nasal     Status: None   Collection Time: 08/11/24  1:01 AM   Specimen: Nasal Mucosa; Nasal Swab  Result Value Ref Range Status   MRSA by PCR Next Gen NOT DETECTED NOT DETECTED Final    Comment: (NOTE) The GeneXpert MRSA Assay (FDA approved for NASAL specimens only), is one component of a comprehensive MRSA colonization surveillance program. It is not intended to diagnose MRSA infection nor to guide or monitor treatment for MRSA infections. Test performance is not FDA approved in patients less than 40 years old. Performed at [facility], [address] [place], [place] [address]   Gastrointestinal Panel by PCR , Stool     Status: None   Collection Time: 08/11/24  7:31 AM   Specimen: STOOL  Result Value Ref Range Status   Campylobacter species NOT DETECTED NOT DETECTED Final   Plesimonas shigelloides NOT DETECTED NOT DETECTED Final   Salmonella species NOT DETECTED NOT DETECTED Final   Yersinia enterocolitica NOT DETECTED NOT DETECTED Final   Vibrio species NOT DETECTED NOT DETECTED Final   Vibrio cholerae NOT DETECTED NOT DETECTED Final   Enteroaggregative E coli (EAEC) NOT DETECTED NOT DETECTED Final   Enteropathogenic E coli (EPEC) NOT DETECTED NOT DETECTED Final   Enterotoxigenic E coli (ETEC) NOT DETECTED NOT DETECTED Final   Shiga like toxin producing E coli (STEC) NOT DETECTED NOT DETECTED Final   Shigella/Enteroinvasive E coli (EIEC) NOT DETECTED NOT DETECTED Final   Cryptosporidium NOT DETECTED NOT DETECTED Final   Cyclospora cayetanensis NOT  DETECTED NOT DETECTED Final   Entamoeba histolytica NOT DETECTED NOT DETECTED Final   Giardia lamblia NOT DETECTED NOT DETECTED Final   Adenovirus F40/41 NOT DETECTED NOT DETECTED Final   Astrovirus NOT DETECTED NOT DETECTED Final   Norovirus GI/GII NOT DETECTED NOT DETECTED Final   Rotavirus A NOT DETECTED NOT DETECTED Final   Sapovirus (I, II, IV, and V) NOT DETECTED NOT DETECTED Final    Comment: Performed at [facility], [address] [place], [place] [address]  Respiratory (~20 pathogens) panel by PCR     Status: None   Collection Time: 08/11/24  9:14 AM   Specimen: Nasopharyngeal Swab; Respiratory  Result Value Ref Range Status   Adenovirus NOT DETECTED NOT DETECTED Final   Coronavirus 229E NOT DETECTED NOT DETECTED Final    Comment: (NOTE) The Coronavirus on the Respiratory Panel, DOES NOT test for the novel  Coronavirus (2019 nCoV)    Coronavirus HKU1 NOT DETECTED NOT DETECTED Final   Coronavirus NL63 NOT DETECTED NOT DETECTED Final   Coronavirus OC43 NOT  DETECTED NOT DETECTED Final   Metapneumovirus NOT DETECTED NOT DETECTED Final   Rhinovirus / Enterovirus NOT DETECTED NOT DETECTED Final   Influenza A NOT DETECTED NOT DETECTED Final   Influenza B NOT DETECTED NOT DETECTED Final   Parainfluenza Virus 1 NOT DETECTED NOT DETECTED Final   Parainfluenza Virus 2 NOT DETECTED NOT DETECTED Final   Parainfluenza Virus 3 NOT DETECTED NOT DETECTED Final   Parainfluenza Virus 4 NOT DETECTED NOT DETECTED Final   Respiratory Syncytial Virus NOT DETECTED NOT DETECTED Final   Bordetella pertussis NOT DETECTED NOT DETECTED Final   Bordetella Parapertussis NOT DETECTED NOT DETECTED Final   Chlamydophila pneumoniae NOT DETECTED NOT DETECTED Final   Mycoplasma pneumoniae NOT DETECTED NOT DETECTED Final    Comment: Performed at [facility], [address], [place], [place] [address]  Urine Culture (for pregnant, neutropenic or urologic patients or patients with an indwelling urinary catheter)     Status: None   Collection Time: 08/11/24  9:46 AM   Specimen: Urine, Clean Catch  Result Value Ref Range Status   Specimen Description URINE, CLEAN CATCH  Final   Special Requests NONE  Final   Culture   Final    NO GROWTH Performed at [facility], [address], [place], [place] [address]    Report Status 08/12/2024 FINAL  Final  Culture, blood (routine x 2)     Status: None   Collection Time: 08/11/24 10:49 AM   Specimen: BLOOD LEFT ARM  Result Value Ref Range Status   Specimen Description BLOOD LEFT ARM  Final   Special Requests   Final    BOTTLES DRAWN AEROBIC ONLY Blood Culture results may not be optimal due to an inadequate volume of blood received in culture bottles   Culture   Final    NO GROWTH 5 DAYS Performed at [facility], [address] [place], [place] [address]    Report Status 08/16/2024 FINAL  Final  Culture, blood (Routine X 2) w Reflex to ID Panel     Status: None (Preliminary result)   Collection Time: 08/15/24   3:58 PM   Specimen: BLOOD  Result Value Ref Range Status   Specimen Description BLOOD LEFT ANTECUBITAL  Final   Special Requests   Final    BOTTLES DRAWN AEROBIC AND ANAEROBIC Blood Culture results may not be optimal due to an inadequate volume of blood received  in culture bottles   Culture   Final    NO GROWTH 4 DAYS Performed at [facility], [address] [place], [place] [address]    Report Status PENDING  Incomplete      Radiology Studies: No results found.   Scheduled Meds:  amLODipine   5 mg Per Tube Daily   donepezil   10 mg Per Tube QHS   feeding supplement (PROSource TF20)  60 mL Per Tube Daily   free water   200 mL Per Tube Q4H   heparin  injection (subcutaneous)  5,000 Units Subcutaneous Q8H   insulin  aspart  0-9 Units Subcutaneous Q4H   memantine   5 mg Per Tube BID   multivitamin with minerals  1 tablet Per Tube Daily   mouth rinse  15 mL Mouth Rinse 4 times per day   pantoprazole  (PROTONIX ) IV  40 mg Intravenous Q24H   Continuous Infusions:  cefTRIAXone  (ROCEPHIN )  IV 2 g (08/18/24 1349)   feeding supplement (OSMOLITE 1.5 CAL) 35 mL/hr at 08/18/24 0500   metronidazole  500 mg (08/19/24 0143)     LOS: 9 days    Joette Pebbles, MD [facility]   If 7PM-7AM, please contact night-coverage [email or web address]  08/19/2024, 8:58 AM   "

## 2024-08-19 NOTE — Plan of Care (Signed)
 Spouse in to visit. Speech eval today with thin liquids and pudding. Very drowsy. Diet started today.    Problem: Education: Goal: Ability to describe self-care measures that may prevent or decrease complications (Diabetes Survival Skills Education) will improve Outcome: Progressing   Problem: Nutritional: Goal: Maintenance of adequate nutrition will improve Outcome: Progressing   Problem: Skin Integrity: Goal: Risk for impaired skin integrity will decrease Outcome: Progressing   Problem: Safety: Goal: Ability to remain free from injury will improve Outcome: Progressing

## 2024-08-19 NOTE — Progress Notes (Signed)
 Speech Language Pathology Treatment: Dysphagia  Patient Details Name: [name] MRN: [id number] DOB: [birth year] Today's Date: 08/19/2024 Time: 8868-8852 SLP Time Calculation (min) (ACUTE ONLY): 16 min  Assessment / Plan / Recommendation Clinical Impression  Pt able to sustain wakeful state for duration of session and with max assist hand over hand took cup and straw sips water  and bites pudding with mild-mod oral delays versus suspected delayed swallow initiation and multiple swallows. She imitated words but no overt command following suspected from likely aphasia. Eating/drinking is more automatic activity. No indications of decreased airway protection however cannot assess fully at bedside. Recommend proceeding with MBS tomorrow for possible safe po intake with suspected intake to be limited.     HPI HPI: 84 y.o. F adm 08/11/24 with N/V/D and fall. Pt with multiple large acute intraparenchymal hemorrhages in Lt parietal and occipital lobes. PMH: vascular dementia, anemia, DDD, COPD, MDD, macular degeneration      SLP Plan  MBS        Swallow Evaluation Recommendations   Recommendations: NPO Medication Administration: Via alternative means Oral care recommendations: Oral care QID (4x/day) Recommended consults: Consider GI consultation     Recommendations                     Oral care QID     Dysphagia, unspecified (R13.10)     MBS     [name], [name]  08/19/2024, 11:59 AM

## 2024-08-19 NOTE — Plan of Care (Signed)
" °  Problem: Metabolic: Goal: Ability to maintain appropriate glucose levels will improve Outcome: Progressing   Problem: Education: Goal: Knowledge of General Education information will improve Description: Including pain rating scale, medication(s)/side effects and non-pharmacologic comfort measures Outcome: Progressing   Problem: Clinical Measurements: Goal: Ability to maintain clinical measurements within normal limits will improve Outcome: Progressing   Problem: Activity: Goal: Risk for activity intolerance will decrease Outcome: Progressing   Problem: Nutrition: Goal: Adequate nutrition will be maintained Outcome: Progressing   Problem: Safety: Goal: Ability to remain free from injury will improve Outcome: Progressing   Problem: Skin Integrity: Goal: Risk for impaired skin integrity will decrease Outcome: Progressing   "

## 2024-08-20 ENCOUNTER — Inpatient Hospital Stay (HOSPITAL_COMMUNITY)

## 2024-08-20 DIAGNOSIS — A419 Sepsis, unspecified organism: Secondary | ICD-10-CM | POA: Diagnosis not present

## 2024-08-20 DIAGNOSIS — I629 Nontraumatic intracranial hemorrhage, unspecified: Secondary | ICD-10-CM | POA: Diagnosis not present

## 2024-08-20 LAB — GLUCOSE, CAPILLARY
Glucose-Capillary: 123 mg/dL — ABNORMAL HIGH (ref 70–99)
Glucose-Capillary: 143 mg/dL — ABNORMAL HIGH (ref 70–99)
Glucose-Capillary: 129 mg/dL — ABNORMAL HIGH (ref 70–99)
Glucose-Capillary: 120 mg/dL — ABNORMAL HIGH (ref 70–99)
Glucose-Capillary: 126 mg/dL — ABNORMAL HIGH (ref 70–99)

## 2024-08-20 LAB — CULTURE, BLOOD (ROUTINE X 2): Culture: NO GROWTH

## 2024-08-20 MED ORDER — LOPERAMIDE HCL 1 MG/7.5ML PO SUSP
4.0000 mg | Freq: Three times a day (TID) | ORAL | Status: DC | PRN
  Administered 2024-08-21 – 2024-08-24 (×2): 4 mg
  Filled 2024-08-20 (×4): qty 30

## 2024-08-20 NOTE — Progress Notes (Signed)
 Modified Barium Swallow Study  Patient Details  Name: HAILEIGH PITZ MRN: [id number] Date of Birth: 1940-12-08  Today's Date: 08/20/2024  Modified Barium Swallow completed.  Full report located under Chart Review in the Imaging Section.  History of Present Illness 84 y.o. F adm 08/11/24 with N/V/D and fall. Pt with multiple large acute intraparenchymal hemorrhages in Lt parietal and occipital lobes. PMH: vascular dementia, anemia, DDD, COPD, MDD, macular degeneration   Clinical Impression   Moderate oral dysphagia; functional pharyngeal phase without penetration or aspiration. Recommend Dys 1 (puree), thin liquids, meds crushed.   Pt exhibits moderate oral dysphagia and functional pharyngeal phase of swallow. Orally there was decreased bolus contol, transport and delays with all consistencies. Majority of trials the bolus was intact with trace amount thin spilling to valleculae. Her transport included repetitive lingual rocking and delayed transit. Pharyngeal mobility was functional despite decreased laryngeal elevation her laryngeal vestibule closure was timely and complete to prevent penetration and aspiration. Apparent penetration was suspected, however on closer inspection, material following honey thick was localized along the upper part of aryepiglottic folds and did not enter the airway.  Mild esophaeal retention. Factors that may increase risk of adverse event in presence of aspiration Noe & Lianne 2021): Frail or deconditioned;Reduced cognitive function;Limited mobility  Swallow Evaluation Recommendations Recommendations: PO diet PO Diet Recommendation: Dysphagia 1 (Pureed);Thin liquids (Level 0) Liquid Administration via: Cup;Straw Medication Administration: Crushed with puree Supervision: Staff to assist with self-feeding Swallowing strategies  : Slow rate;Small bites/sips Postural changes: Position pt fully upright for meals Oral care recommendations: Oral care BID  (2x/day)      [name] 08/20/2024,6:29 PM

## 2024-08-20 NOTE — Progress Notes (Signed)
 " PROGRESS NOTE    [name]  FMW:[id number] DOB: 1941/04/01 DOA: 08/10/2024 PCP: Patti Mliss LABOR, FNP   Brief Narrative: 84 year old with past medical history significant for vascular dementia, anemia of chronic illness, GERD, bilateral ureteral stents, prior TIA presents with nausea vomiting and diarrhea for 3 days and a fall the night of admission.  It seems that patient hit her head.  She was on aspirin  prior to admission.  Patient was obtunded on admission admitted by the critical care team on 1/21st.  Patient was found to have multiple large acute intraparenchymal hemorrhage left parietal and occipital lobes.  Neurology and neurosurgery consulted.  Definitive treatment deferred 2 to 3 months if patient recovers.  Diagnostic angiogram may be consider if recovers.  Patient was also found to have diarrhea, dehydration and metabolic acidosis.  Sepsis secondary to GI source versus UTI.  Patient was transferred to [facility] starting 1/25. Patient continued to have fever, which improved on 1/27. She was started on ceftriaxone  and flagyl .   Assessment & Plan:  Multiple large acute intraparenchymal hemorrhage centered in the left parietal and occipital lobe/SAH/SDH extension Could be related to AVM versus traumatic versus hemorrhagic conversion. -CT head 1/20: Multiple large acute IPH left parietal lobe and left occipital lobe, small volume extra axial  extension of hemorrhage.  Possible Intraventricular  extension into left occipital horn. - Repeated CT head 1/21st: Stable extensive left posterior hemisphere intra-axial hemorrhage, stable small regional subarachnoid/subdural space extension of blood.  - MRI brain: Left occipital posterior left temporal lobe hemorrhage, left parietal lobe hemorrhage with layering blood product hyperemia. - CTA consistent with AVM Patient was seen by neurology and neurosurgery.  Plan for further investigations including cerebral angiogram depending on patient's  improvement. Patient remains on Keppra . -Spot EEG 1/20-second cortical dysfunction generalized cerebral dysfunction no seizures. -LDL 70 A1c 5.4 echo ejection fraction 65%, moderate dilation of the left and right atrial size. -Was on aspirin  prior to admission now on not antithrombotic given multiple large IPH Palliative care is following.  Ongoing conversations with family regarding goals of care. Patient has been responding more but without any significant neurological improvement.  Her oral intake has also been very poor.  Husband being updated every day.    Concern for colitis/Nausea vomiting GI pathogen negative Now on Ceftriaxone  and Flagyl .   Has a Flexi-Seal.  Monitor stool output.  May consider discontinuing antibiotics if stool output decreases. WBC noted to be 11.5.  Patient remains afebrile.  No further episodes of nausea and vomiting reported.  Sepsis secondary to GI source versus UTI: Possible UTI present on arrival - Patient initially presented with fever, tachycardia, leukocytosis, source possible GI versus UTI. - Chest x-ray without acute finding.   C. difficile unable to test due to solid stool, COVID influenza RSV negative. -CT abdomen and pelvis diffuse colon wall thickening and area of the small bowel wall thickening as may be seen in enterocolitis of infectious or inflammatory etiology.  Possible esophagitis.  Slightly thick walled urinary bladder. - Urine culture no growth, blood culture no growth, respiratory panel completed negative. Chest x-ray from 1/25 suggested left-sided small pleural effusion as well as atelectasis. Patient remains on ceftriaxone  and metronidazole . Blood cultures from 1/26 negative so far.  Hypokalemia Hypophosphatemia - Replace as indicated  Hyperglycemia: Likely secondary to tube feedings.  Continue sliding scale insulin   History of vascular dementia Anxiety Depression - Ammonia 19, Lexapro  was discontinued by husband - Continue  Namenda  and Aricept   Anemia of chronic  illness: B12: 1200, folate more than 20, iron within normal limit Transfuse for hemoglobin less than 7 Stable hemoglobin noted.  Recheck labs in the morning.  GERD: Continue Pepcid   Moderate protein calorie malnutrition  Dysphagia Continues to get tube feedings through [facility].  Goals of care Palliative care is following.  Will need to see if there is any neurological recovery before pursuing further interventions.  DVT prophylaxis:heparin    Code Status: DNR Family Communication: Husband being updated periodically. Disposition Plan: To be determined.  SNF is being considered.   Consultants:  Neurology Neurosurgery CCM  Procedures:  none   Subjective: Opens her eyes.  Mumbles a few words.  But does not answer questions appropriately.  Noted to be moving her extremities.    Objective: Vitals:   08/20/24 0000 08/20/24 0417 08/20/24 0551 08/20/24 0814  BP: 132/61 138/61  130/60  Pulse: 82 77  78  Resp: 17 17  15   Temp: 98.4 F (36.9 C) 98.5 F (36.9 C)  98.7 F (37.1 C)  TempSrc: Oral Oral  Oral  SpO2: 98% 98%  99%  Weight:   50.2 kg   Height:        Intake/Output Summary (Last 24 hours) at 08/20/2024 0851 Last data filed at 08/20/2024 0422 Gross per 24 hour  Intake 1000 ml  Output 500 ml  Net 500 ml   Filed Weights   08/17/24 0441 08/19/24 0353 08/20/24 0551  Weight: 47 kg 51.4 kg 50.2 kg    Examination:  General appearance: Opens her eyes when name is called.  Does not follow commands. Resp: Clear to auscultation bilaterally.  Normal effort Cardio: S1-S2 is normal regular.  No S3-S4.  No rubs murmurs or bruit GI: Abdomen is soft.  Nontender nondistended.  Bowel sounds are present normal.  No masses organomegaly Noted to be moving her extremities.  Data Reviewed: I have personally reviewed following labs and reports of imaging studies  CBC: Recent Labs  Lab 08/14/24 0844 08/15/24 0726 08/17/24 0541  08/19/24 0104  WBC 11.7* 11.3* 10.5 11.5*  HGB 9.2* 9.9* 9.8* 9.9*  HCT 27.8* 29.4* 29.8* 30.1*  MCV 96.5 95.1 96.8 95.6  PLT 227 234 273 317   Basic Metabolic Panel: Recent Labs  Lab 08/14/24 0844 08/15/24 0726 08/16/24 0144 08/17/24 0541 08/19/24 0104  NA 143 144 145 146* 139  K 3.7 3.8 3.6 3.7 3.6  CL 109 107 109 109 103  CO2 25 28 28 30 25   GLUCOSE 221* 145* 130* 118* 122*  BUN 23 22 26* 27* 25*  CREATININE 0.71 0.78 0.73 0.70 0.58  CALCIUM 8.6* 8.4* 8.4* 8.6* 8.3*  MG 2.2 2.1  --   --  2.0  PHOS 1.5* 2.9  --   --  2.6    CBG: Recent Labs  Lab 08/19/24 1547 08/19/24 2030 08/19/24 2320 08/20/24 0401 08/20/24 0801  GLUCAP 132* 125* 124* 123* 143*    Recent Results (from the past 240 hours)  Culture, blood (routine x 2)     Status: None   Collection Time: 08/10/24  9:10 PM   Specimen: BLOOD  Result Value Ref Range Status   Specimen Description BLOOD RIGHT ANTECUBITAL  Final   Special Requests   Final    BOTTLES DRAWN AEROBIC AND ANAEROBIC Blood Culture results may not be optimal due to an inadequate volume of blood received in culture bottles   Culture   Final    NO GROWTH 5 DAYS Performed at [facility], [address], [place], [place] [address]    Report Status 08/15/2024 FINAL  Final  Resp panel by RT-PCR (RSV, Flu A&B, Covid) Anterior Nasal Swab     Status: None   Collection Time: 08/10/24  9:11 PM   Specimen: Anterior Nasal Swab  Result Value Ref Range Status   SARS Coronavirus 2 by RT PCR NEGATIVE NEGATIVE Final   Influenza A by PCR NEGATIVE NEGATIVE Final   Influenza B by PCR NEGATIVE NEGATIVE Final    Comment: (NOTE) The Xpert Xpress SARS-CoV-2/FLU/RSV plus assay is intended as an aid in the diagnosis of influenza from Nasopharyngeal swab specimens and should not be used as a sole basis for treatment. Nasal washings and aspirates are unacceptable for Xpert Xpress SARS-CoV-2/FLU/RSV testing.  Fact Sheet for  Patients: bloggercourse.com  Fact Sheet for Healthcare Providers: seriousbroker.it  This test is not yet approved or cleared by the [place]  FDA and has been authorized for detection and/or diagnosis of SARS-CoV-2 by FDA under an Emergency Use Authorization (EUA). This EUA will remain in effect (meaning this test can be used) for the duration of the COVID-19 declaration under Section 564(b)(1) of the Act, 21 U.S.C. section 360bbb-3(b)(1), unless the authorization is terminated or revoked.     Resp Syncytial Virus by PCR NEGATIVE NEGATIVE Final    Comment: (NOTE) Fact Sheet for Patients: bloggercourse.com  Fact Sheet for Healthcare Providers: seriousbroker.it  This test is not yet approved or cleared by the [place]  FDA and has been authorized for detection and/or diagnosis of SARS-CoV-2 by FDA under an Emergency Use Authorization (EUA). This EUA will remain in effect (meaning this test can be used) for the duration of the COVID-19 declaration under Section 564(b)(1) of the Act, 21 U.S.C. section 360bbb-3(b)(1), unless the authorization is terminated or revoked.  Performed at [facility] Lab, [address] [place], [place] [address]   MRSA Next Gen by PCR, Nasal     Status: None   Collection Time: 08/11/24  1:01 AM   Specimen: Nasal Mucosa; Nasal Swab  Result Value Ref Range Status   MRSA by PCR Next Gen NOT DETECTED NOT DETECTED Final    Comment: (NOTE) The GeneXpert MRSA Assay (FDA approved for NASAL specimens only), is one component of a comprehensive MRSA colonization surveillance program. It is not intended to diagnose MRSA infection nor to guide or monitor treatment for MRSA infections. Test performance is not FDA approved in patients less than 106 years old. Performed at [facility], [address] [place], [place] [address]   Gastrointestinal  Panel by PCR , Stool     Status: None   Collection Time: 08/11/24  7:31 AM   Specimen: STOOL  Result Value Ref Range Status   Campylobacter species NOT DETECTED NOT DETECTED Final   Plesimonas shigelloides NOT DETECTED NOT DETECTED Final   Salmonella species NOT DETECTED NOT DETECTED Final   Yersinia enterocolitica NOT DETECTED NOT DETECTED Final   Vibrio species NOT DETECTED NOT DETECTED Final   Vibrio cholerae NOT DETECTED NOT DETECTED Final   Enteroaggregative E coli (EAEC) NOT DETECTED NOT DETECTED Final   Enteropathogenic E coli (EPEC) NOT DETECTED NOT DETECTED Final   Enterotoxigenic E coli (ETEC) NOT DETECTED NOT DETECTED Final   Shiga like toxin producing E coli (STEC) NOT DETECTED NOT DETECTED Final   Shigella/Enteroinvasive E coli (EIEC) NOT DETECTED NOT DETECTED Final   Cryptosporidium NOT DETECTED NOT DETECTED Final   Cyclospora cayetanensis NOT DETECTED NOT DETECTED Final  Entamoeba histolytica NOT DETECTED NOT DETECTED Final   Giardia lamblia NOT DETECTED NOT DETECTED Final   Adenovirus F40/41 NOT DETECTED NOT DETECTED Final   Astrovirus NOT DETECTED NOT DETECTED Final   Norovirus GI/GII NOT DETECTED NOT DETECTED Final   Rotavirus A NOT DETECTED NOT DETECTED Final   Sapovirus (I, II, IV, and V) NOT DETECTED NOT DETECTED Final    Comment: Performed at [facility], [address] [place], [place] [address]  Respiratory (~20 pathogens) panel by PCR     Status: None   Collection Time: 08/11/24  9:14 AM   Specimen: Nasopharyngeal Swab; Respiratory  Result Value Ref Range Status   Adenovirus NOT DETECTED NOT DETECTED Final   Coronavirus 229E NOT DETECTED NOT DETECTED Final    Comment: (NOTE) The Coronavirus on the Respiratory Panel, DOES NOT test for the novel  Coronavirus (2019 nCoV)    Coronavirus HKU1 NOT DETECTED NOT DETECTED Final   Coronavirus NL63 NOT DETECTED NOT DETECTED Final   Coronavirus OC43 NOT DETECTED NOT DETECTED Final   Metapneumovirus NOT  DETECTED NOT DETECTED Final   Rhinovirus / Enterovirus NOT DETECTED NOT DETECTED Final   Influenza A NOT DETECTED NOT DETECTED Final   Influenza B NOT DETECTED NOT DETECTED Final   Parainfluenza Virus 1 NOT DETECTED NOT DETECTED Final   Parainfluenza Virus 2 NOT DETECTED NOT DETECTED Final   Parainfluenza Virus 3 NOT DETECTED NOT DETECTED Final   Parainfluenza Virus 4 NOT DETECTED NOT DETECTED Final   Respiratory Syncytial Virus NOT DETECTED NOT DETECTED Final   Bordetella pertussis NOT DETECTED NOT DETECTED Final   Bordetella Parapertussis NOT DETECTED NOT DETECTED Final   Chlamydophila pneumoniae NOT DETECTED NOT DETECTED Final   Mycoplasma pneumoniae NOT DETECTED NOT DETECTED Final    Comment: Performed at [facility], [address], [place], [place] [address]  Urine Culture (for pregnant, neutropenic or urologic patients or patients with an indwelling urinary catheter)     Status: None   Collection Time: 08/11/24  9:46 AM   Specimen: Urine, Clean Catch  Result Value Ref Range Status   Specimen Description URINE, CLEAN CATCH  Final   Special Requests NONE  Final   Culture   Final    NO GROWTH Performed at [facility], [address], [place], [place] [address]    Report Status 08/12/2024 FINAL  Final  Culture, blood (routine x 2)     Status: None   Collection Time: 08/11/24 10:49 AM   Specimen: BLOOD LEFT ARM  Result Value Ref Range Status   Specimen Description BLOOD LEFT ARM  Final   Special Requests   Final    BOTTLES DRAWN AEROBIC ONLY Blood Culture results may not be optimal due to an inadequate volume of blood received in culture bottles   Culture   Final    NO GROWTH 5 DAYS Performed at [facility], [address] [place], [place] [address]    Report Status 08/16/2024 FINAL  Final  Culture, blood (Routine X 2) w Reflex to ID Panel     Status: None   Collection Time: 08/15/24  3:58 PM   Specimen: BLOOD  Result Value Ref Range Status   Specimen  Description BLOOD LEFT ANTECUBITAL  Final   Special Requests   Final    BOTTLES DRAWN AEROBIC AND ANAEROBIC Blood Culture results may not be optimal due to an inadequate volume of blood received in culture bottles   Culture  Final    NO GROWTH 5 DAYS Performed at [facility], [address] [place], [place] [address]    Report Status 08/20/2024 FINAL  Final      Radiology Studies: No results found.   Scheduled Meds:  amLODipine   5 mg Per Tube Daily   donepezil   10 mg Per Tube QHS   feeding supplement (PROSource TF20)  60 mL Per Tube Daily   free water   200 mL Per Tube Q4H   heparin  injection (subcutaneous)  5,000 Units Subcutaneous Q8H   insulin  aspart  0-9 Units Subcutaneous Q4H   memantine   5 mg Per Tube BID   multivitamin with minerals  1 tablet Per Tube Daily   mouth rinse  15 mL Mouth Rinse 4 times per day   pantoprazole  (PROTONIX ) IV  40 mg Intravenous Q24H   Continuous Infusions:  cefTRIAXone  (ROCEPHIN )  IV 2 g (08/19/24 1332)   feeding supplement (OSMOLITE 1.5 CAL) 35 mL/hr at 08/18/24 0500   metronidazole  500 mg (08/20/24 0200)     LOS: 10 days    Joette Pebbles, MD [facility]   If 7PM-7AM, please contact night-coverage [email or web address]  08/20/2024, 8:51 AM   "

## 2024-08-20 NOTE — Plan of Care (Signed)
" °  Problem: Nutritional: Goal: Maintenance of adequate nutrition will improve Outcome: Progressing Goal: Progress toward achieving an optimal weight will improve Outcome: Progressing   Problem: Skin Integrity: Goal: Risk for impaired skin integrity will decrease Outcome: Progressing   Problem: Clinical Measurements: Goal: Ability to maintain clinical measurements within normal limits will improve Outcome: Progressing Goal: Will remain free from infection Outcome: Progressing Goal: Diagnostic test results will improve Outcome: Progressing Goal: Respiratory complications will improve Outcome: Progressing Goal: Cardiovascular complication will be avoided Outcome: Progressing   Problem: Nutrition: Goal: Adequate nutrition will be maintained Outcome: Progressing   Problem: Elimination: Goal: Will not experience complications related to bowel motility Outcome: Progressing Goal: Will not experience complications related to urinary retention Outcome: Progressing   Problem: Pain Managment: Goal: General experience of comfort will improve and/or be controlled Outcome: Progressing   Problem: Safety: Goal: Ability to remain free from injury will improve Outcome: Progressing   Problem: Skin Integrity: Goal: Risk for impaired skin integrity will decrease Outcome: Progressing   "

## 2024-08-20 NOTE — Progress Notes (Signed)
 " Daily Progress Note   Date: 08/20/2024   Patient Name: [name]  DOB: 1940/12/06  MRN: [id number]  Age / Sex: 84 y.o., female  Attending Physician: Verdene Purchase, MD Primary Care Physician: Patti Mliss LABOR, FNP Admit Date: 08/10/2024 Length of Stay: 10 days  Reason for Follow-up: Establishing goals of care  Past Medical History:  Diagnosis Date   Anemia    Anxiety    Arthritis    Chronic kidney disease    COPD (chronic obstructive pulmonary disease) (HCC)    Dementia (HCC)    vascular dementia   GERD (gastroesophageal reflux disease)    History of kidney stones    Macular degeneration of left eye    Wet and gets injections q monthly   Smoker    1- 1 1/2 ppd  per Husband   Stroke (HCC)    TIAs    Subjective:   Subjective: Chart Reviewed. Updates received. Patient Assessed. Created space and opportunity for patient  and family to explore thoughts and feelings regarding current medical situation.  Today's Discussion: Today before meeting with the patient/family, I reviewed the chart notes including nursing note from yesterday, internal medicine note from yesterday, SLP note from yesterday, nurse note from today, telemedicine from today, SLP note from today. I also reviewed vital signs, nursing flowsheets, medication administrations record, labs, and imaging.  Imaging reviewed including diagnostic swallow exam from today.  Prior to seeing the patient I did receive a message from SLP indicating that she did well with her swallow exam. No aspiration or penetration, no significant residue. Took longer to transit bolus to back to initiate swallow given time and cues she will. Alertness and intake will be the key .  Today saw the patient at bedside, no family is present.  She did not wake much when I tried to wake her up.  I notified nursing that I will try to call the patient's husband and see if he would be in today.  However, I went on to see another patient.  I received  a message from the nurse that the patient has been arise and I returned to the bedside shortly thereafter.  I saw the patient at the bedside, her husband was present.  This afternoon she is much more awake, opens her eyes, follows commands including squeezing my hands and requested.  She has appropriate responses to questions.  Her husband states that she recognized him when he came in.  Overall he feels that she is the most alert and awake since she has been here.  We spent time talking about her trajectory thus far.  He states that going into that she was weak because of a couple hospitalizations recently, but was gaining strength with therapy until the fall that resulted in her rehospitalization.  Her biggest issue with ambulation was being unsure on her feet and balance issues, but they were looking for appropriate shoes to help.  We spent time talking about her current state.  Husband is happy that she is much more alert and awake, happy that she did well with her swallow exam.  I shared that the diet has been started and it will be important to encourage her eating to see how she does.  She does still have a core track tube for nutrition supplementation.  However I shared that the key to her progress and options will be whether she is able to eat enough to sustain herself.  He confirmed again that she would  not want a permanent feeding tube/PEG tube.  After our discussion we decided a couple more days to see how she does now that she can eat to see if she will be able to sustain herself.  How well she does we will determine options moving forward.  I shared that palliative medicine would follow-up early next week to touch base about how she has done.  I shared that I am back on Wednesday and would see them at that time.  I provided emotional and general support through therapeutic listening, empathy, sharing of stories, and other techniques. I answered all questions and addressed all concerns to the  best of my ability.  After my visit I debriefed with the hospitalist and nursing team on goals at this point, which are more time for outcomes on how she does now that she is functionally able to eat.  Review of Systems  Constitutional:        Denies pain in general  Cardiovascular:  Negative for chest pain.  Gastrointestinal:  Negative for abdominal pain, nausea and vomiting.    Objective:   Primary Diagnoses: Present on Admission: **None**   Vital Signs:  BP 124/60   Pulse 78   Temp 98.7 F (37.1 C) (Oral)   Resp 15   Ht 4' 10 (1.473 m)   Wt 50.2 kg   SpO2 99%   BMI 23.13 kg/m   Physical Exam Vitals and nursing note reviewed.  Constitutional:      General: She is sleeping. She is not in acute distress.    Appearance: She is ill-appearing.  HENT:     Head: Normocephalic and atraumatic.  Pulmonary:     Effort: Pulmonary effort is normal. No respiratory distress.  Abdominal:     General: Abdomen is flat. There is no distension.  Skin:    General: Skin is warm and dry.  Neurological:     Mental Status: She is easily aroused.     Comments: Opening eyes spontaneously and to voice, follows commands     Palliative Assessment/Data: 20%   Existing Vynca/ACP Documentation: none  Assessment & Plan:   HPI/Patient Profile:  84 y.o. female with past medical history of vascular dementia, anemia of chronic illness, GERD, bilateral ureteral stents, and prior TIA, presents for evaluation of nausea, vomiting, diarrhea for 3 days and fall. After ED evaluation she was admitted on 08/10/2024 with multiple large acute intraparenchymal hemorrhages centered of the left parietal and occipital lobes without midline shift, AVM, fall of unclear mechanism, diarrhea, dehydration, nausea/vomiting, sepsis secondary to GI versus UTI source, vascular dementia, and others.    08/20/2024: The patient is more awake/alert today, following commands.  Husband states to suggest he has seen her since  she has been here.  Did fantastic with SLP swallow eval without aspiration, penetration, residual.  The key moving forward will be how much she eats and whether she can eat enough to sustain herself.  Plan ongoing GOC conversations pending on how she does over the next 48 hours.  SUMMARY OF RECOMMENDATIONS   DNR decimated Continue current scope of care Encourage patient oral intake Allow a couple days to see how she does with oral intake Palliative medicine will follow-up early next week  Symptom Management:  Per primary team Palliative medicine is available to assist as needed  Code Status: DNR - Limited (DNR/DNI)  Prognosis: Unable to determine  Discharge Planning: To Be Determined  Discussed with: Patient, family, medical team, nursing team, SLP  Thank  you for allowing us  to participate in the care of Leondra R Ihrig PMT will continue to support holistically.  Time Total: 60 min  Detailed review of medical records (labs, imaging, vital signs), medically appropriate exam, discussed with treatment team, counseling and education to patient, family, & staff, documenting clinical information, medication management, coordination of care  Camellia Kays, NP Palliative Medicine Team  Team Phone # [phone] (Nights/Weekends)  03/20/2021, 8:17 AM  "

## 2024-08-20 NOTE — Progress Notes (Signed)
 Speech Language Pathology Name: [name] MRN: [id number] DOB: [birth year] Today's Date: 08/20/2024 Time:  -     Pt currently not on a diet as documented in RN note- SLP inavertently placed diet order meant for another pt and changed back to NPO. She does have an MBS scheduled today- time to be determined and will notify RN when scheduled.     [name]  08/20/2024, 8:25 AM

## 2024-08-20 NOTE — Progress Notes (Signed)
 Physical Therapy Treatment Patient Details Name: [name] MRN: [id number] DOB: 1941/05/03 Today's Date: 08/20/2024   History of Present Illness 84 y.o. F adm 08/11/24 with N/V/D and fall. Pt with multiple large acute intraparenchymal hemorrhages in Lt parietal and occipital lobes. PMH: vascular dementia, anemia, DDD, COPD, MDD, macular degeneration    PT Comments  Pt greeted supine in bed, pleasant and agreeable to PT session. She was alert and participated well during session, following ~15% commands with increased time and max cues. Pt advanced OOB mobility, completing sit<>stand using stedy with modA x2 from the bed and minA x2 for the stedy seat. She maintained static stance for a prolonged period of time ~10 minutes. Multi-modal cues for improved posture. Pt was initially unsteady with a NBOS, where she had BLE touching on the left side of the stedy requiring modA for support. Once she advanced BOS to shoulder width apart, pt required less assist to maintaining static stance with CGA-minA. Deferred further mobility d/t leaking rectal tube, RN and NT entering to manage and complete pericare. Will continue to follow acutely and advance appropriately. Patient will benefit from continued inpatient follow up therapy, <3 hours/day.     If plan is discharge home, recommend the following: A lot of help with bathing/dressing/bathroom;Supervision due to cognitive status;Help with stairs or ramp for entrance;Assist for transportation;Assistance with feeding;Assistance with cooking/housework;Direct supervision/assist for medications management;Direct supervision/assist for financial management;A lot of help with walking and/or transfers   Can travel by private vehicle     No  Equipment Recommendations  Hospital bed;Wheelchair (measurements PT);Wheelchair cushion (measurements PT)    Recommendations for Other Services OT consult     Precautions / Restrictions Precautions Precautions:  Fall Recall of Precautions/Restrictions: Impaired Precaution/Restrictions Comments: SBP <160; cortrak; rectal tube Restrictions Weight Bearing Restrictions Per Provider Order: No     Mobility  Bed Mobility Overal bed mobility: Needs Assistance Bed Mobility: Supine to Sit, Sit to Supine     Supine to sit: Max assist, +2 for physical assistance, +2 for safety/equipment Sit to supine: Max assist, +2 for physical assistance, +2 for safety/equipment   General bed mobility comments: Instruced pt to sit up on the side of the bed. No active initation noted. PT managed BLE and Therapy tech managed trunk, used helicopter method to sit her EOB. Scooted fwd with use of bed pad til feet flat. Returning to bed, PT controlled trunk down and NT brought BLE back into bed.    Transfers Overall transfer level: Needs assistance Equipment used: Ambulation equipment used Transfers: Sit to/from Stand Sit to Stand: Mod assist, +2 physical assistance, +2 safety/equipment, Min assist, Via lift equipment           General transfer comment: Pt stood from lowest bed height. She brought BUE onto crossbar with hand-over-hand guidance. Pt had a tendency to bring RLE close to LLE so they were touching. Cues to keep feet shoulder width apart and on either side of the foot platform. Powered up with modA x2. Pt stood from raised stedy seat with minA x2. She maintained static stance for a prolonged period of time. As she fatigued, pt demonstrated increase trunk flex and hip flex. Able to correct position with VC/TC. Transfer via Lift Equipment: Stedy  Ambulation/Gait               General Gait Details: deferred attempt d/t leaking rectal tube   Stairs             Wheelchair Mobility  Tilt Bed    Modified Rankin (Stroke Patients Only) Modified Rankin (Stroke Patients Only) Pre-Morbid Rankin Score: Slight disability Modified Rankin: Severe disability     Balance Overall balance  assessment: Needs assistance Sitting-balance support: Bilateral upper extremity supported, Feet supported Sitting balance-Leahy Scale: Fair Sitting balance - Comments: Sat EOB with CGA.   Standing balance support: Bilateral upper extremity supported, During functional activity, Reliant on assistive device for balance Standing balance-Leahy Scale: Poor Standing balance comment: Pt dependent on stedy and min-modA x2                            Communication Communication Communication: Impaired Factors Affecting Communication: Difficulty expressing self  Cognition Arousal: Alert Behavior During Therapy: Flat affect   PT - Cognitive impairments: No family/caregiver present to determine baseline, History of cognitive impairments (Vascular Dementia)                       PT - Cognition Comments: Pt with good engagement during session. She kept eyes open throughout treatment. Pt demonstrated delayed processing and took increased time to following commands. Following commands: Impaired Following commands impaired: Follows one step commands inconsistently, Follows one step commands with increased time    Cueing Cueing Techniques: Verbal cues, Gestural cues, Tactile cues  Exercises      General Comments General comments (skin integrity, edema, etc.): Pt found to be soiled upon standing, noted rectal tube leaking. RN called and notified. RN and NT entered to assist in pericare.      Pertinent Vitals/Pain Pain Assessment Pain Assessment: PAINAD Breathing: normal Negative Vocalization: none Facial Expression: smiling or inexpressive Body Language: relaxed Consolability: no need to console PAINAD Score: 0 Pain Intervention(s): Monitored during session    Home Living                          Prior Function            PT Goals (current goals can now be found in the care plan section) Acute Rehab PT Goals Patient Stated Goal: Get better PT Goal  Formulation: Patient unable to participate in goal setting Time For Goal Achievement: 08/25/24 Potential to Achieve Goals: Fair Progress towards PT goals: Progressing toward goals    Frequency    Min 2X/week      PT Plan      Co-evaluation              AM-PAC PT 6 Clicks Mobility   Outcome Measure  Help needed turning from your back to your side while in a flat bed without using bedrails?: Total Help needed moving from lying on your back to sitting on the side of a flat bed without using bedrails?: Total Help needed moving to and from a bed to a chair (including a wheelchair)?: Total Help needed standing up from a chair using your arms (e.g., wheelchair or bedside chair)?: Total Help needed to walk in hospital room?: Total Help needed climbing 3-5 steps with a railing? : Total 6 Click Score: 6    End of Session Equipment Utilized During Treatment: Gait belt Activity Tolerance: Patient tolerated treatment well Patient left: in bed;with call bell/phone within reach;with nursing/sitter in room Nurse Communication: Mobility status;Other (comment);Need for lift equipment (rectal tube leaking; stedy with +2 assist) PT Visit Diagnosis: Unsteadiness on feet (R26.81);Muscle weakness (generalized) (M62.81);Difficulty in walking, not elsewhere classified (R26.2)  Time: 8493-8464 PT Time Calculation (min) (ACUTE ONLY): 29 min  Charges:    $Therapeutic Activity: 23-37 mins PT General Charges $$ ACUTE PT VISIT: 1 Visit                     Randall SAUNDERS, PT, DPT Acute Rehabilitation Services Office: [phone] Secure Chat Preferred  Delon CHRISTELLA Callander 08/20/2024, 4:14 PM

## 2024-08-20 NOTE — Plan of Care (Signed)
 Pt went for barium swallow at 1000 accompanied by transporter and received at around 1100. Feeding was hold at that time , MD was notified and again connected back after returning to unit. Rectal tube was removed around 1600 as it got leaked. Able to stand with the help of assistance and stedy. Husband came to visit her during day time.  Problem: Coping: Goal: Ability to adjust to condition or change in health will improve Outcome: Progressing   Problem: Health Behavior/Discharge Planning: Goal: Ability to identify and utilize available resources and services will improve Outcome: Progressing   Problem: Nutritional: Goal: Maintenance of adequate nutrition will improve Outcome: Progressing   Problem: Nutritional: Goal: Progress toward achieving an optimal weight will improve Outcome: Progressing   Problem: Skin Integrity: Goal: Risk for impaired skin integrity will decrease Outcome: Progressing   Problem: Tissue Perfusion: Goal: Adequacy of tissue perfusion will improve Outcome: Progressing   Problem: Clinical Measurements: Goal: Ability to maintain clinical measurements within normal limits will improve Outcome: Progressing   Problem: Clinical Measurements: Goal: Will remain free from infection Outcome: Progressing   Problem: Clinical Measurements: Goal: Diagnostic test results will improve Outcome: Progressing   Problem: Activity: Goal: Risk for activity intolerance will decrease Outcome: Progressing   Problem: Elimination: Goal: Will not experience complications related to bowel motility Outcome: Progressing   Problem: Safety: Goal: Ability to remain free from injury will improve Outcome: Progressing

## 2024-08-21 DIAGNOSIS — I629 Nontraumatic intracranial hemorrhage, unspecified: Secondary | ICD-10-CM | POA: Diagnosis not present

## 2024-08-21 DIAGNOSIS — A419 Sepsis, unspecified organism: Secondary | ICD-10-CM | POA: Diagnosis not present

## 2024-08-21 LAB — COMPREHENSIVE METABOLIC PANEL WITH GFR
ALT: 24 U/L (ref 0–44)
AST: 29 U/L (ref 15–41)
Albumin: 3.1 g/dL — ABNORMAL LOW (ref 3.5–5.0)
Alkaline Phosphatase: 44 U/L (ref 38–126)
Anion gap: 9 (ref 5–15)
BUN: 19 mg/dL (ref 8–23)
CO2: 24 mmol/L (ref 22–32)
Calcium: 8.5 mg/dL — ABNORMAL LOW (ref 8.9–10.3)
Chloride: 100 mmol/L (ref 98–111)
Creatinine, Ser: 0.63 mg/dL (ref 0.44–1.00)
GFR, Estimated: >60 mL/min
Glucose, Bld: 132 mg/dL — ABNORMAL HIGH (ref 70–99)
Potassium: 3.8 mmol/L (ref 3.5–5.1)
Sodium: 134 mmol/L — ABNORMAL LOW (ref 135–145)
Total Bilirubin: <0.2 mg/dL (ref 0.0–1.2)
Total Protein: 6.1 g/dL — ABNORMAL LOW (ref 6.5–8.1)

## 2024-08-21 LAB — CBC
HCT: 29.5 % — ABNORMAL LOW (ref 36.0–46.0)
Hemoglobin: 10.1 g/dL — ABNORMAL LOW (ref 12.0–15.0)
MCH: 31.9 pg (ref 26.0–34.0)
MCHC: 34.2 g/dL (ref 30.0–36.0)
MCV: 93.1 fL (ref 80.0–100.0)
Platelets: 334 10*3/uL (ref 150–400)
RBC: 3.17 MIL/uL — ABNORMAL LOW (ref 3.87–5.11)
RDW: 13.6 % (ref 11.5–15.5)
WBC: 13.1 10*3/uL — ABNORMAL HIGH (ref 4.0–10.5)
nRBC: 0 % (ref 0.0–0.2)

## 2024-08-21 LAB — PHOSPHORUS: Phosphorus: 2.8 mg/dL (ref 2.5–4.6)

## 2024-08-21 LAB — GLUCOSE, CAPILLARY
Glucose-Capillary: 108 mg/dL — ABNORMAL HIGH (ref 70–99)
Glucose-Capillary: 120 mg/dL — ABNORMAL HIGH (ref 70–99)
Glucose-Capillary: 131 mg/dL — ABNORMAL HIGH (ref 70–99)
Glucose-Capillary: 133 mg/dL — ABNORMAL HIGH (ref 70–99)
Glucose-Capillary: 141 mg/dL — ABNORMAL HIGH (ref 70–99)
Glucose-Capillary: 143 mg/dL — ABNORMAL HIGH (ref 70–99)
Glucose-Capillary: 149 mg/dL — ABNORMAL HIGH (ref 70–99)

## 2024-08-21 LAB — MAGNESIUM: Magnesium: 1.9 mg/dL (ref 1.7–2.4)

## 2024-08-21 NOTE — Plan of Care (Signed)
 Is confused and forgetful , frequently reorientation done to environment , time and place. Unable to retain memory even for a short time. The best thing is she follows every command and was able to feed her orally.  Problem: Metabolic: Goal: Ability to maintain appropriate glucose levels will improve Outcome: Progressing   Problem: Nutritional: Goal: Maintenance of adequate nutrition will improve Outcome: Progressing   Problem: Nutritional: Goal: Progress toward achieving an optimal weight will improve Outcome: Progressing   Problem: Tissue Perfusion: Goal: Adequacy of tissue perfusion will improve Outcome: Progressing   Problem: Skin Integrity: Goal: Risk for impaired skin integrity will decrease Outcome: Progressing   Problem: Clinical Measurements: Goal: Ability to maintain clinical measurements within normal limits will improve Outcome: Progressing   Problem: Health Behavior/Discharge Planning: Goal: Ability to manage health-related needs will improve Outcome: Progressing   Problem: Activity: Goal: Risk for activity intolerance will decrease Outcome: Progressing   Problem: Nutrition: Goal: Adequate nutrition will be maintained Outcome: Progressing   Problem: Elimination: Goal: Will not experience complications related to bowel motility Outcome: Progressing   Problem: Elimination: Goal: Will not experience complications related to urinary retention Outcome: Progressing   Problem: Safety: Goal: Ability to remain free from injury will improve Outcome: Progressing   Problem: Pain Managment: Goal: General experience of comfort will improve and/or be controlled Outcome: Progressing

## 2024-08-21 NOTE — Plan of Care (Signed)
" °  Problem: Health Behavior/Discharge Planning: Goal: Ability to manage health-related needs will improve Outcome: Progressing   Problem: Skin Integrity: Goal: Risk for impaired skin integrity will decrease Outcome: Progressing   Problem: Nutritional: Goal: Maintenance of adequate nutrition will improve Outcome: Progressing   Problem: Health Behavior/Discharge Planning: Goal: Ability to manage health-related needs will improve Outcome: Progressing   Problem: Clinical Measurements: Goal: Will remain free from infection Outcome: Progressing   Problem: Clinical Measurements: Goal: Respiratory complications will improve Outcome: Progressing   Problem: Activity: Goal: Risk for activity intolerance will decrease Outcome: Progressing   Problem: Coping: Goal: Level of anxiety will decrease Outcome: Progressing   Problem: Safety: Goal: Ability to remain free from injury will improve Outcome: Progressing   Problem: Skin Integrity: Goal: Risk for impaired skin integrity will decrease Outcome: Progressing   "

## 2024-08-21 NOTE — Evaluation (Addendum)
 Occupational Therapy Evaluation Patient Details Name: [name] MRN: [id number] DOB: 03/26/1941 Today's Date: 08/21/2024   History of Present Illness   84 y.o. F adm 08/11/24 with N/V/D and fall. Pt with multiple large acute intraparenchymal hemorrhages in Lt parietal and occipital lobes. PMH: vascular dementia, anemia, DDD, COPD, MDD, macular degeneration     Clinical Impressions Pt seen for OT evaluation this AM. Greeted pt resting comfortably in bed, flat affect, nonverbally communicative, and not following any commands. Unknown PLOF, info taken from previous admission. Does not blind to threat on R, does on L. Responds to noxious stim in B hemibody. R gaze preference. Total A for all self-care and bed mobility. Did not attempt EOB due to pt not following commands and no +2 assist. BUE tone appears intact, PROM WNL. VSS.  Pt is currently functioning below baseline and would benefit from ongoing acute OT services to progress towards safe discharge and to facilitate return to prior level of function. Current recommendation is post-acute rehab (< 3 hours/day).     If plan is discharge home, recommend the following:   Two people to help with walking and/or transfers;Two people to help with bathing/dressing/bathroom;Assistance with feeding;Assistance with cooking/housework;Direct supervision/assist for medications management;Direct supervision/assist for financial management;Supervision due to cognitive status     Functional Status Assessment   Patient has had a recent decline in their functional status and demonstrates the ability to make significant improvements in function in a reasonable and predictable amount of time.     Equipment Recommendations   Other (comment) (defer to next level of care)     Recommendations for Other Services         Precautions/Restrictions   Precautions Precautions: Fall Recall of Precautions/Restrictions:  Impaired Precaution/Restrictions Comments: SBP <160; cortrak; rectal tube Restrictions Weight Bearing Restrictions Per Provider Order: No     Mobility Bed Mobility Overal bed mobility: Needs Assistance Bed Mobility: Rolling Rolling: Total assist         General bed mobility comments: needs dep A for brief rolling and repositioning; pt not following commands to safely progress to EOB    Transfers                   General transfer comment: not assessed      Balance               Standing balance comment: NT                           ADL either performed or assessed with clinical judgement   ADL Overall ADL's : Needs assistance/impaired Eating/Feeding: NPO Eating/Feeding Details (indicate cue type and reason): Cortrak Grooming: Total assistance   Upper Body Bathing: Total assistance   Lower Body Bathing: Total assistance   Upper Body Dressing : Total assistance   Lower Body Dressing: Total assistance       Toileting- Clothing Manipulation and Hygiene: Total assistance               Vision Baseline Vision/History:  (unaware) Vision Assessment?: Vision impaired- to be further tested in functional context Additional Comments: unable to perform formal assessment 2/2 impaired cog; pt with L gaze pref     Perception Perception: Impaired Preception Impairment Details: Inattention/Neglect Perception-Other Comments: R inattention; L gaze preference   Praxis Praxis: Not tested       Pertinent Vitals/Pain Pain Assessment Pain Assessment: PAINAD Breathing: normal Negative Vocalization: occasional moan/groan, low speech, negative/disapproving  quality Facial Expression: smiling or inexpressive Body Language: relaxed Consolability: no need to console PAINAD Score: 1 Pain Intervention(s): Monitored during session     Extremity/Trunk Assessment Upper Extremity Assessment Upper Extremity Assessment: Difficult to assess due to  impaired cognition (pt resisting therapist mobilizing R wrist, but no active movement in RUE)   Lower Extremity Assessment Lower Extremity Assessment: Defer to PT evaluation       Communication Communication Communication: Impaired Factors Affecting Communication: Difficulty expressing self (nonverbally communicative)   Cognition Arousal: Alert (awake, eyes open) Behavior During Therapy: Flat affect Cognition: History of cognitive impairments (documented dementia per chart)             OT - Cognition Comments: pt not following commands or fixating on therapist, does localize to noxious stim in LHB and RHB                 Following commands: Impaired Following commands impaired:  (does not follow commands)     Cueing  General Comments   Cueing Techniques: Verbal cues;Visual cues;Tactile cues  pt resting comfortably in bed, gazing out window at snow falling   Exercises     Shoulder Instructions      Home Living Family/patient expects to be discharged to:: Private residence Living Arrangements: Spouse/significant other Available Help at Discharge: Family;Available 24 hours/day Type of Home: House Home Access: Stairs to enter [employer] of Steps: 2 Entrance Stairs-Rails: Right Home Layout: One level     Bathroom Shower/Tub: Chief Strategy Officer: Standard Bathroom Accessibility: No   Home Equipment: Grab bars - tub/shower;Shower seat;Rollator (4 wheels)   Additional Comments: pt poor historian, home set up taken from recent admission. per chart pt lives with spouse      Prior Functioning/Environment Prior Level of Function : Independent/Modified Independent             Mobility Comments: No AD, h/o falls ADLs Comments: Ind, mostly supervision for shower transfers    OT Problem List: Decreased strength;Decreased range of motion;Impaired balance (sitting and/or standing);Impaired vision/perception;Decreased  coordination;Decreased cognition;Decreased safety awareness;Impaired UE functional use   OT Treatment/Interventions: Self-care/ADL training;Therapeutic exercise;Neuromuscular education;DME and/or AE instruction;Therapeutic activities;Cognitive remediation/compensation;Visual/perceptual remediation/compensation;Patient/family education;Balance training      OT Goals(Current goals can be found in the care plan section)   Acute Rehab OT Goals Patient Stated Goal: pt did not state OT Goal Formulation: With patient Time For Goal Achievement: 09/04/24 Potential to Achieve Goals: Fair   OT Frequency:  Min 2X/week    Co-evaluation              AM-PAC OT 6 Clicks Daily Activity     Outcome Measure Help from another person eating meals?: Total Help from another person taking care of personal grooming?: Total Help from another person toileting, which includes using toliet, bedpan, or urinal?: Total Help from another person bathing (including washing, rinsing, drying)?: Total Help from another person to put on and taking off regular upper body clothing?: Total Help from another person to put on and taking off regular lower body clothing?: Total 6 Click Score: 6   End of Session Nurse Communication: Other (comment) (pt status)  Activity Tolerance: Patient tolerated treatment well Patient left: in bed;with call bell/phone within reach;with bed alarm set  OT Visit Diagnosis: Unsteadiness on feet (R26.81);Other abnormalities of gait and mobility (R26.89);History of falling (Z91.81);Other symptoms and signs involving cognitive function                Time: [phone]  OT Time Calculation (min): 10 min Charges:  OT General Charges $OT Visit: 1 Visit OT Evaluation $OT Eval Moderate Complexity: 1 Mod  [place] Shannahan M. [place], OTR/L [facility] Acute Rehabilitation Services [phone] Secure Chat Preferred  [name] 08/21/2024, 1:29 PM

## 2024-08-22 ENCOUNTER — Inpatient Hospital Stay (HOSPITAL_COMMUNITY)

## 2024-08-22 DIAGNOSIS — A419 Sepsis, unspecified organism: Secondary | ICD-10-CM | POA: Diagnosis not present

## 2024-08-22 DIAGNOSIS — I629 Nontraumatic intracranial hemorrhage, unspecified: Secondary | ICD-10-CM | POA: Diagnosis not present

## 2024-08-22 LAB — GLUCOSE, CAPILLARY
Glucose-Capillary: 116 mg/dL — ABNORMAL HIGH (ref 70–99)
Glucose-Capillary: 119 mg/dL — ABNORMAL HIGH (ref 70–99)
Glucose-Capillary: 122 mg/dL — ABNORMAL HIGH (ref 70–99)
Glucose-Capillary: 142 mg/dL — ABNORMAL HIGH (ref 70–99)
Glucose-Capillary: 143 mg/dL — ABNORMAL HIGH (ref 70–99)
Glucose-Capillary: 150 mg/dL — ABNORMAL HIGH (ref 70–99)

## 2024-08-22 MED ORDER — OSMOLITE 1.5 CAL PO LIQD
1000.0000 mL | ORAL | Status: DC
  Filled 2024-08-22: qty 1000

## 2024-08-22 MED ORDER — METOCLOPRAMIDE HCL 5 MG/ML IJ SOLN: 10.0000 mg | Freq: Once | INTRAMUSCULAR | Status: DC

## 2024-08-22 NOTE — Plan of Care (Signed)
 Had a projectile vomiting around 1830. Airway was cleared and pt was kept on lateral position. PRN med administered. MD was notified around 22. MD responded  at  1834, advise to hold tube feeding and plan to do x-ray. Tube feeding was hold from 1835. Pt remain alert but not oriented throughout the daytime. Frequent orientation to environment, call bell, place and time was given but unable to retain memory.   Problem: Nutritional: Goal: Maintenance of adequate nutrition will improve Outcome: Progressing   Problem: Nutritional: Goal: Progress toward achieving an optimal weight will improve Outcome: Progressing   Problem: Skin Integrity: Goal: Risk for impaired skin integrity will decrease Outcome: Progressing   Problem: Health Behavior/Discharge Planning: Goal: Ability to manage health-related needs will improve Outcome: Progressing   Problem: Tissue Perfusion: Goal: Adequacy of tissue perfusion will improve Outcome: Progressing   Problem: Skin Integrity: Goal: Risk for impaired skin integrity will decrease Outcome: Progressing   Problem: Clinical Measurements: Goal: Will remain free from infection Outcome: Progressing   Problem: Clinical Measurements: Goal: Ability to maintain clinical measurements within normal limits will improve Outcome: Progressing   Problem: Clinical Measurements: Goal: Diagnostic test results will improve Outcome: Progressing   Problem: Activity: Goal: Risk for activity intolerance will decrease Outcome: Progressing   Problem: Elimination: Goal: Will not experience complications related to urinary retention Outcome: Progressing   Problem: Elimination: Goal: Will not experience complications related to bowel motility Outcome: Progressing   Problem: Safety: Goal: Ability to remain free from injury will improve Outcome: Progressing

## 2024-08-22 NOTE — Progress Notes (Signed)
 " PROGRESS NOTE    [name]  FMW:[id number] DOB: 01-22-1941 DOA: 08/10/2024 PCP: Patti Mliss LABOR, FNP   Brief Narrative: 84 year old with past medical history significant for vascular dementia, anemia of chronic illness, GERD, bilateral ureteral stents, prior TIA presents with nausea vomiting and diarrhea for 3 days and a fall the night of admission.  It seems that patient hit her head.  She was on aspirin  prior to admission.  Patient was obtunded on admission admitted by the critical care team on 1/21st.  Patient was found to have multiple large acute intraparenchymal hemorrhage left parietal and occipital lobes.  Neurology and neurosurgery consulted.  Definitive treatment deferred 2 to 3 months if patient recovers.  Diagnostic angiogram may be consider if recovers.  Patient was also found to have diarrhea, dehydration and metabolic acidosis.  Sepsis secondary to GI source versus UTI. Patient was transferred to [facility] starting 1/25. Patient continued to have fever, which improved on 1/27. She was started on ceftriaxone  and flagyl .   Assessment & Plan:  Multiple large acute intraparenchymal hemorrhage centered in the left parietal and occipital lobe/SAH/SDH extension Could be related to AVM versus traumatic versus hemorrhagic conversion. -CT head 1/20: Multiple large acute IPH left parietal lobe and left occipital lobe, small volume extra axial  extension of hemorrhage.  Possible Intraventricular  extension into left occipital horn. - Repeated CT head 1/21st: Stable extensive left posterior hemisphere intra-axial hemorrhage, stable small regional subarachnoid/subdural space extension of blood.  - MRI brain: Left occipital posterior left temporal lobe hemorrhage, left parietal lobe hemorrhage with layering blood product hyperemia. - CTA consistent with AVM Patient was seen by neurology and neurosurgery.  Plan for further investigations including cerebral angiogram depending on patient's  improvement. Patient remains on Keppra . -Spot EEG 1/20-second cortical dysfunction generalized cerebral dysfunction no seizures. -LDL 70 A1c 5.4 echo ejection fraction 65%, moderate dilation of the left and right atrial size. -Was on aspirin  prior to admission now on not antithrombotic given multiple large IPH Palliative care is following.  Ongoing conversations with family regarding goals of care. Patient is awake alert as she has been in the last 3 to 4 days but not very responsive.    Sepsis secondary to GI source versus UTI/concern for colitis - Patient initially presented with fever, tachycardia, leukocytosis, source possible GI versus UTI. - Chest x-ray without acute finding.   C. difficile unable to test due to solid stool, COVID influenza RSV negative. -CT abdomen and pelvis diffuse colon wall thickening and area of the small bowel wall thickening as may be seen in enterocolitis of infectious or inflammatory etiology.  Possible esophagitis.  Slightly thick walled urinary bladder. - Urine culture no growth, blood culture no growth, respiratory panel completed negative. Chest x-ray from 1/25 suggested left-sided small pleural effusion as well as atelectasis. Patient was started on ceftriaxone  and Flagyl  due to concern for colitis.  Continues to have loose stool in her Flexi-Seal.  Stool output could also be from her tube feedings.   Blood cultures from 1/26 negative so far. She has been afebrile.  WBC was high yesterday.  Will recheck tomorrow. Continue antibiotics for now.  Will plan a 10-day course. Did have an episode of nausea vomiting yesterday.  Abdomen remains benign.  Continue to monitor.  Consider abdominal x-ray if she has recurrent symptoms.  Hypokalemia Hypophosphatemia Replace as indicated  Hyperglycemia: Likely secondary to tube feedings.  Continue sliding scale insulin   History of vascular dementia Anxiety Depression Ammonia 19, Lexapro  was  discontinued by  husband Continue Namenda  and Aricept   Anemia of chronic illness: B12: 1200, folate more than 20, iron within normal limit Transfuse for hemoglobin less than 7 Stable hemoglobin noted.  Recheck labs in the morning.  GERD: Continue Pepcid   Moderate protein calorie malnutrition  Dysphagia Continues to get tube feedings through cortrak. Speech therapy is following.  Now on a dysphagia 1 diet.  Unclear if she will be able to take adequately by mouth to satisfy caloric requirements.  Goals of care Palliative care is following.  Will see how she does over the next few days with respect to her ability to eat and drink.  DVT prophylaxis:heparin    Code Status: DNR Family Communication: Husband being updated periodically. Disposition Plan: To be determined.  SNF is being considered.   Consultants:  Neurology Neurosurgery CCM  Procedures:  none   Subjective: Patient is awake.  Distracted.  Does not answer any questions.  Objective: Vitals:   08/21/24 2317 08/22/24 0341 08/22/24 0500 08/22/24 0826  BP: (!) 134/57 (!) 131/57  (!) 124/56  Pulse: 94 91  86  Resp:  19  19  Temp: 98.1 F (36.7 C) 99 F (37.2 C)  99.1 F (37.3 C)  TempSrc: Oral Oral  Oral  SpO2: 98% 97%  98%  Weight:   51.3 kg   Height:        Intake/Output Summary (Last 24 hours) at 08/22/2024 0855 Last data filed at 08/22/2024 0435 Gross per 24 hour  Intake 1050 ml  Output 1950 ml  Net -900 ml   Filed Weights   08/20/24 0551 08/21/24 0500 08/22/24 0500  Weight: 50.2 kg 50.5 kg 51.3 kg    Examination:  General appearance: Awake alert.  In no distress Resp: Clear to auscultation bilaterally.  Normal effort Cardio: S1-S2 is normal regular.  No S3-S4.  No rubs murmurs or bruit GI: Abdomen is soft.  Nontender nondistended.  Bowel sounds are present normal.  No masses organomegaly    Data Reviewed: I have personally reviewed following labs and reports of imaging studies  CBC: Recent Labs  Lab  08/17/24 0541 08/19/24 0104 08/21/24 0647  WBC 10.5 11.5* 13.1*  HGB 9.8* 9.9* 10.1*  HCT 29.8* 30.1* 29.5*  MCV 96.8 95.6 93.1  PLT 273 317 334   Basic Metabolic Panel: Recent Labs  Lab 08/16/24 0144 08/17/24 0541 08/19/24 0104 08/21/24 0647  NA 145 146* 139 134*  K 3.6 3.7 3.6 3.8  CL 109 109 103 100  CO2 28 30 25 24   GLUCOSE 130* 118* 122* 132*  BUN 26* 27* 25* 19  CREATININE 0.73 0.70 0.58 0.63  CALCIUM 8.4* 8.6* 8.3* 8.5*  MG  --   --  2.0 1.9  PHOS  --   --  2.6 2.8    CBG: Recent Labs  Lab 08/21/24 1233 08/21/24 1723 08/21/24 2047 08/21/24 2316 08/22/24 0346  GLUCAP 143* 141* 149* 120* 143*    Recent Results (from the past 240 hours)  Culture, blood (Routine X 2) w Reflex to ID Panel     Status: None   Collection Time: 08/15/24  3:58 PM   Specimen: BLOOD  Result Value Ref Range Status   Specimen Description BLOOD LEFT ANTECUBITAL  Final   Special Requests   Final    BOTTLES DRAWN AEROBIC AND ANAEROBIC Blood Culture results may not be optimal due to an inadequate volume of blood received in culture bottles   Culture   Final    NO  GROWTH 5 DAYS Performed at [facility], [address], [place], [place] [address]    Report Status 08/20/2024 FINAL  Final      Radiology Studies: DG Swallowing Func-Speech Pathology Result Date: 08/20/2024 Table formatting from the original result was not included. Modified Barium Swallow Study Patient Details Name: [name] MRN: [id number] Date of Birth: 08/25/1940 Today's Date: 08/20/2024 HPI/PMH: HPI: 84 y.o. F adm 08/11/24 with N/V/D and fall. Pt with multiple large acute intraparenchymal hemorrhages in Lt parietal and occipital lobes. PMH: vascular dementia, anemia, DDD, COPD, MDD, macular degeneration Clinical Impression: Moderate oral dysphagia; functional pharyngeal phase without penetration or aspiration. Recommend Dys 1 (puree), thin liquids, meds crushed. Clinical Impression: Pt exhibits moderate oral  dysphagia and functional pharyngeal phase of swallow. Orally there was decreased bolus contol, transport and delays with all consistencies. Majority of trials the bolus was intact with trace amount thin spilling to valleculae. Her transport included repetitive lingual rocking and delayed transit. Pharyngeal mobility was functional despite decreased laryngeal elevation her laryngeal vestibule closure was timely and complete to prevent penetration and aspiration. Apparent penetration was suspected, however on closer inspection, material following honey thick was localized along the upper part of aryepiglottic folds and did not enter the airway.  Mild esophaeal retention. Factors that may increase risk of adverse event in presence of aspiration Noe & Lianne 2021): Factors that may increase risk of adverse event in presence of aspiration Noe & Lianne 2021): Frail or deconditioned; Reduced cognitive function; Limited mobility Recommendations/Plan: Swallowing Evaluation Recommendations Swallowing Evaluation Recommendations Recommendations: PO diet PO Diet Recommendation: Dysphagia 1 (Pureed); Thin liquids (Level 0) Liquid Administration via: Cup; Straw Medication Administration: Crushed with puree Supervision: Staff to assist with self-feeding Swallowing strategies  : Slow rate; Small bites/sips Postural changes: Position pt fully upright for meals Oral care recommendations: Oral care BID (2x/day) Recommended consults: Consider GI consultation Treatment Plan Treatment Plan Treatment recommendations: Therapy as outlined in treatment plan below Functional status assessment: Patient has had a recent decline in their functional status and demonstrates the ability to make significant improvements in function in a reasonable and predictable amount of time. Treatment frequency: Min 2x/week Treatment duration: 2 weeks Interventions: Diet toleration management by SLP; Trials of upgraded texture/liquids; Patient/family  education; Compensatory techniques Recommendations Recommendations for follow up therapy are one component of a multi-disciplinary discharge planning process, led by the attending physician.  Recommendations may be updated based on patient status, additional functional criteria and insurance authorization. Assessment: Orofacial Exam: Orofacial Exam Oral Cavity: Oral Hygiene: WFL Oral Cavity - Dentition: Missing dentition (missing upper, some lower) Orofacial Anatomy: -- (will assess further) Oral Motor/Sensory Function: -- (will asses further) Anatomy: Anatomy: WFL Boluses Administered: Boluses Administered Boluses Administered: Thin liquids (Level 0); Mildly thick liquids (Level 2, nectar thick); Moderately thick liquids (Level 3, honey thick); Puree  Oral Impairment Domain: Oral Impairment Domain Lip Closure: Interlabial escape, no progression to anterior lip Tongue control during bolus hold: Posterior escape of less than half of bolus (x 1 with thin) Bolus preparation/mastication: -- (NT) Bolus transport/lingual motion: Repetitive/disorganized tongue motion Oral residue: Residue collection on oral structures Location of oral residue : Tongue Initiation of pharyngeal swallow : Pyriform sinuses  Pharyngeal Impairment Domain: Pharyngeal Impairment Domain Soft palate elevation: No bolus between soft palate (SP)/pharyngeal wall (PW) Laryngeal elevation: Partial superior movement of thyroid  cartilage/partial approximation of arytenoids to epiglottic petiole Anterior hyoid excursion: Complete anterior movement Epiglottic movement: Complete inversion Laryngeal vestibule closure: Complete, no air/contrast in  laryngeal vestibule Pharyngeal stripping wave : Present - complete Pharyngeal contraction (A/P view only): N/A Pharyngoesophageal segment opening: Partial distention/partial duration, partial obstruction of flow Tongue base retraction: No contrast between tongue base and posterior pharyngeal wall (PPW) Pharyngeal  residue: Trace residue within or on pharyngeal structures Location of pharyngeal residue: Pyriform sinuses  Esophageal Impairment Domain: Esophageal Impairment Domain Esophageal clearance upright position: Esophageal retention Pill: No data recorded Penetration/Aspiration Scale Score: Penetration/Aspiration Scale Score 1.  Material does not enter airway: Thin liquids (Level 0); Mildly thick liquids (Level 2, nectar thick); Moderately thick liquids (Level 3, honey thick); Puree Compensatory Strategies: Compensatory Strategies Compensatory strategies: No   General Information: Caregiver present: No  Diet Prior to this Study: NPO; Cortrak/Small bore NG tube   Temperature : Normal   Respiratory Status: WFL   Supplemental O2: None (Room air)   History of Recent Intubation: No  Behavior/Cognition: Other (Comment) (mildly drowsy) Self-Feeding Abilities: Able to self-feed Baseline vocal quality/speech: Normal No data recorded No data recorded Exam Limitations: No limitations Goal Planning: Prognosis for improved oropharyngeal function: Good No data recorded No data recorded No data recorded Consulted and agree with results and recommendations: Pt unable/family or caregiver not available; Physician; Nurse Pain: Pain Assessment Pain Assessment: No/denies pain Breathing: 0 Negative Vocalization: 0 Facial Expression: 0 Body Language: 0 Consolability: 0 PAINAD Score: 0 Pain Intervention(s): Monitored during session End of Session: Start Time:SLP Start Time (ACUTE ONLY): 1012 Stop Time: SLP Stop Time (ACUTE ONLY): 1024 Time Calculation:SLP Time Calculation (min) (ACUTE ONLY): 12 min Charges: SLP Evaluations $ SLP Speech Visit: 1 Visit SLP Evaluations $MBS Swallow: 1 Procedure $Swallowing Treatment: 1 Procedure SLP visit diagnosis: SLP Visit Diagnosis: Dysphagia, oral phase (R13.11) Past Medical History: Past Medical History: Diagnosis Date  Anemia   Anxiety   Arthritis   Chronic kidney disease   COPD (chronic obstructive  pulmonary disease) (HCC)   Dementia (HCC)   vascular dementia  GERD (gastroesophageal reflux disease)   History of kidney stones   Macular degeneration of left eye   Wet and gets injections q monthly  Smoker   1- 1 1/2 ppd  per Husband  Stroke [facility])   TIAs Past Surgical History: Past Surgical History: Procedure Laterality Date  CHOLECYSTECTOMY  10/24/2010  Laparoscopic  COLECTOMY  07/22/2004  BENIGN TUMOR  CYSTOSCOPY W/ URETERAL STENT PLACEMENT Left 01/14/2024  Procedure: CYSTOSCOPY, WITH RETROGRADE PYELOGRAM AND URETERAL STENT INSERTION;  Surgeon: Elisabeth Valli BIRCH, MD;  Location: [facility] OR;  Service: Urology;  Laterality: Left;  CYSTOSCOPY/URETEROSCOPY/HOLMIUM LASER/STENT PLACEMENT Left 03/11/2024  Procedure: CYSTOSCOPY/URETEROSCOPY/HOLMIUM LASER/STENT PLACEMENT;  Surgeon: Elisabeth Valli BIRCH, MD;  Location: [facility] ORS;  Service: Urology;  Laterality: Left;  CYSTOSCOPY/LEFT URETEROSCOPY/HOLMIUM LASER/STENT EXCHANGE  DILATION AND CURETTAGE OF UTERUS    EYE SURGERY Bilateral   cataract extraction  TRANSESOPHAGEAL ECHOCARDIOGRAM (CATH LAB) N/A 10/17/2023  Procedure: TRANSESOPHAGEAL ECHOCARDIOGRAM;  Surgeon: Pietro Redell RAMAN, MD;  Location: [facility] INVASIVE CV LAB;  Service: Cardiovascular;  Laterality: N/A; [name] 08/20/2024, 6:31 PM    Scheduled Meds:  amLODipine   5 mg Per Tube Daily   donepezil   10 mg Per Tube QHS   feeding supplement (PROSource TF20)  60 mL Per Tube Daily   free water   200 mL Per Tube Q4H   heparin  injection (subcutaneous)  5,000 Units Subcutaneous Q8H   insulin  aspart  0-9 Units Subcutaneous Q4H   memantine   5 mg Per Tube BID   multivitamin with minerals  1 tablet Per Tube Daily   mouth rinse  15 mL Mouth Rinse 4 times per day   pantoprazole  (PROTONIX ) IV  40 mg Intravenous Q24H   Continuous Infusions:  cefTRIAXone  (ROCEPHIN )  IV 2 g (08/21/24 1408)   feeding supplement (OSMOLITE 1.5 CAL) 1,000 mL (08/21/24 0640)   metronidazole  500 mg (08/22/24 0219)     LOS: 12 days     Joette Pebbles, MD [facility]   If 7PM-7AM, please contact night-coverage [email or web address]  08/22/2024, 8:55 AM   "

## 2024-08-22 NOTE — Progress Notes (Signed)
 "  Palliative Medicine Inpatient Follow Up Note HPI:  84 y.o. female with past medical history of vascular dementia, anemia of chronic illness, GERD, bilateral ureteral stents, and prior TIA, presents for evaluation of nausea, vomiting, diarrhea for 3 days and fall. After ED evaluation she was admitted on 08/10/2024 with multiple large acute intraparenchymal hemorrhages centered of the left parietal and occipital lobes without midline shift, AVM, fall of unclear mechanism, diarrhea, dehydration, nausea/vomiting, sepsis secondary to GI versus UTI source, vascular dementia, and others.   Palliative care involved to support additional goals of care conversations.   Today's Discussion 08/22/2024  I reviewed the chart notes including nursing notes from [facility], progress notes from Dr. Joette. I also reviewed vital signs which are stable, nursing flowsheets - patients PO's around 25% yesterday though poor today, medication administrations record, glucose levels, and swallow study from 1/30 which patient did well with.   I spoke to patients RN, Roshni who states patient does not seem interested in eating or drinking today.   I called and spoke to patients husband, Lynwood created space and opportunity for patient to explore thoughts feelings and fears regarding current medical situation. We reviewed that Nyasha as of today is less awake and does not appear to want to eat or drink anything. We discussed her current conditions and potential outcomes functionally and nutritionally. Patients spouse shares that Kevionna was a get up and go type of woman. He shares that if she were not able to function in that way again she would not have quality of life.  We reviewed Quinlee's nutritional state at this juncture and how patient was able to eat some yesterday, she swallow eval results, and her overall intake. We reviewed that Lenya would never have wanted artificial nutrition.   We discussed paths moving forward  if patient neglects to eat and would not have wanted PEG. We reviewed neurology notes. Patients spouse wants better insight on if she would be autonomous again, eat sufficiently again, and suffer from additional stroke(s) potentially.   Patients spouse plans to consider information shared and is open to ongoing conversations in the days ahead.   Questions and concerns addressed/Palliative Support Provided.   Objective Assessment: Vital Signs Vitals:   08/22/24 1005 08/22/24 1220  BP: (!) 123/58 126/66  Pulse:  93  Resp:  16  Temp:  98.8 F (37.1 C)  SpO2:  98%    Intake/Output Summary (Last 24 hours) at 08/22/2024 1331 Last data filed at 08/22/2024 1222 Gross per 24 hour  Intake 1250 ml  Output 1150 ml  Net 100 ml   Last Weight  Most recent update: 08/22/2024  5:27 AM    Weight  51.3 kg (113 lb 1.5 oz)            Gen:  Elderly Caucasian F chronically ill appearing HEENT: drymucous membranes CV: Regular rate and rhythm  PULM: On RA, breathing is even and nonlabored ABD: soft/nontender  EXT: No edema  Neuro: Sleepy this morning but arousable  SUMMARY OF RECOMMENDATIONS   DNAR/DNI  Appreciate the Neurology and Primary team(s) insights on chances of recovery  Patient oscillates with nutritional intake and is a 1:1 feed --> Spouse shares patient would not wants artificial nutrition via a PEG  Patient was very independent and liked to be up and about --> Spouse informed this is very likely to change to a point of patient being dependent to some degree on others  Ongoing PMT support ______________________________________________________________________________________ Rosaline Becton [facility] Palliative Medicine Team Team Cell Phone: [phone] Please utilize secure chat with additional questions, if there is no response within 30 minutes please call the above phone number  I personally spent a total of 37 minutes in the care of the patient today including preparing  to see the patient, getting/reviewing separately obtained history, performing a medically appropriate exam/evaluation, counseling and educating, referring and communicating with other health care professionals, documenting clinical information in the EHR, communicating results, and coordinating care.     "

## 2024-08-22 NOTE — Plan of Care (Signed)
 0320 Pt vomited once in small amount, noted some undigested food and feeding. Pt stated she felt better after. Monitored vitals closely. MD Opyd made aware.  0500 Pt is comfortably sleeping and not in any distress.  Problem: Fluid Volume: Goal: Ability to maintain a balanced intake and output will improve Outcome: Progressing   Problem: Coping: Goal: Ability to adjust to condition or change in health will improve Outcome: Progressing   Problem: Health Behavior/Discharge Planning: Goal: Ability to manage health-related needs will improve Outcome: Progressing   Problem: Metabolic: Goal: Ability to maintain appropriate glucose levels will improve Outcome: Progressing   Problem: Nutritional: Goal: Maintenance of adequate nutrition will improve Outcome: Progressing   Problem: Nutritional: Goal: Progress toward achieving an optimal weight will improve Outcome: Progressing   Problem: Skin Integrity: Goal: Risk for impaired skin integrity will decrease Outcome: Progressing   Problem: Health Behavior/Discharge Planning: Goal: Ability to manage health-related needs will improve Outcome: Progressing   Problem: Clinical Measurements: Goal: Will remain free from infection Outcome: Progressing   Problem: Activity: Goal: Risk for activity intolerance will decrease Outcome: Progressing   Problem: Safety: Goal: Ability to remain free from injury will improve Outcome: Progressing

## 2024-08-23 DIAGNOSIS — A419 Sepsis, unspecified organism: Secondary | ICD-10-CM | POA: Diagnosis not present

## 2024-08-23 DIAGNOSIS — I629 Nontraumatic intracranial hemorrhage, unspecified: Secondary | ICD-10-CM | POA: Diagnosis not present

## 2024-08-23 LAB — CBC
HCT: 29.6 % — ABNORMAL LOW (ref 36.0–46.0)
Hemoglobin: 10.1 g/dL — ABNORMAL LOW (ref 12.0–15.0)
MCH: 32.2 pg (ref 26.0–34.0)
MCHC: 34.1 g/dL (ref 30.0–36.0)
MCV: 94.3 fL (ref 80.0–100.0)
Platelets: 376 10*3/uL (ref 150–400)
RBC: 3.14 MIL/uL — ABNORMAL LOW (ref 3.87–5.11)
RDW: 14.3 % (ref 11.5–15.5)
WBC: 14.8 10*3/uL — ABNORMAL HIGH (ref 4.0–10.5)
nRBC: 0 % (ref 0.0–0.2)

## 2024-08-23 LAB — GLUCOSE, CAPILLARY
Glucose-Capillary: 109 mg/dL — ABNORMAL HIGH (ref 70–99)
Glucose-Capillary: 110 mg/dL — ABNORMAL HIGH (ref 70–99)
Glucose-Capillary: 113 mg/dL — ABNORMAL HIGH (ref 70–99)
Glucose-Capillary: 130 mg/dL — ABNORMAL HIGH (ref 70–99)
Glucose-Capillary: 134 mg/dL — ABNORMAL HIGH (ref 70–99)
Glucose-Capillary: 166 mg/dL — ABNORMAL HIGH (ref 70–99)

## 2024-08-23 LAB — COMPREHENSIVE METABOLIC PANEL WITH GFR
ALT: 28 U/L (ref 0–44)
AST: 31 U/L (ref 15–41)
Albumin: 3.3 g/dL — ABNORMAL LOW (ref 3.5–5.0)
Alkaline Phosphatase: 46 U/L (ref 38–126)
Anion gap: 9 (ref 5–15)
BUN: 19 mg/dL (ref 8–23)
CO2: 26 mmol/L (ref 22–32)
Calcium: 8.8 mg/dL — ABNORMAL LOW (ref 8.9–10.3)
Chloride: 96 mmol/L — ABNORMAL LOW (ref 98–111)
Creatinine, Ser: 0.65 mg/dL (ref 0.44–1.00)
GFR, Estimated: >60 mL/min
Glucose, Bld: 88 mg/dL (ref 70–99)
Potassium: 3.3 mmol/L — ABNORMAL LOW (ref 3.5–5.1)
Sodium: 132 mmol/L — ABNORMAL LOW (ref 135–145)
Total Bilirubin: 0.3 mg/dL (ref 0.0–1.2)
Total Protein: 6.3 g/dL — ABNORMAL LOW (ref 6.5–8.1)

## 2024-08-23 LAB — MAGNESIUM: Magnesium: 2.2 mg/dL (ref 1.7–2.4)

## 2024-08-23 MED ORDER — METOCLOPRAMIDE HCL 10 MG PO TABS
5.0000 mg | ORAL_TABLET | Freq: Three times a day (TID) | ORAL | Status: DC
  Administered 2024-08-23 – 2024-08-25 (×6): 5 mg
  Filled 2024-08-23 (×6): qty 1

## 2024-08-23 MED ORDER — POTASSIUM CHLORIDE 20 MEQ PO PACK
40.0000 meq | PACK | Freq: Once | ORAL | Status: AC
  Administered 2024-08-23: 40 meq
  Filled 2024-08-23: qty 2

## 2024-08-23 NOTE — Plan of Care (Signed)

## 2024-08-23 NOTE — Plan of Care (Signed)
" °  Problem: Health Behavior/Discharge Planning: Goal: Ability to manage health-related needs will improve Outcome: Progressing   Problem: Nutritional: Goal: Maintenance of adequate nutrition will improve Outcome: Progressing   Problem: Skin Integrity: Goal: Risk for impaired skin integrity will decrease Outcome: Progressing   Problem: Health Behavior/Discharge Planning: Goal: Ability to manage health-related needs will improve Outcome: Progressing   Problem: Clinical Measurements: Goal: Will remain free from infection Outcome: Progressing   Problem: Nutrition: Goal: Adequate nutrition will be maintained Outcome: Progressing   Problem: Elimination: Goal: Will not experience complications related to bowel motility Outcome: Progressing   Problem: Safety: Goal: Ability to remain free from injury will improve Outcome: Progressing   "

## 2024-08-23 NOTE — Progress Notes (Signed)
 Speech Language Pathology Treatment: Dysphagia  Patient Details Name: DIAVION LABRADOR MRN: [id number] DOB: 04-12-1941 Today's Date: 08/23/2024 Time: 8768-8751 SLP Time Calculation (min) (ACUTE ONLY): 17 min  Assessment / Plan / Recommendation Clinical Impression  Pt required assist with feeding due to suspected visual deficits. She easily aroused and accepted puree/thin demonstrating mild-mod prolonged transit as present during MBS. No residue observed and no indications of decreased airway protection. Noted from Palliative care note that she has continued with fluctuating alertness and decreased intake. Continue puree/thin and ST for safety, efficiency and assist with self or assisted feeding if pt able.    HPI HPI: 84 y.o. F adm 08/11/24 with N/V/D and fall. Pt with multiple large acute intraparenchymal hemorrhages in Lt parietal and occipital lobes. PMH: vascular dementia, anemia, DDD, COPD, MDD, macular degeneration      SLP Plan  Continue with current plan of care        Swallow Evaluation Recommendations   Recommendations: PO diet PO Diet Recommendation: Dysphagia 1 (Pureed);Thin liquids (Level 0) Liquid Administration via: Cup;Straw Medication Administration: Crushed with puree Supervision: Staff to assist with self-feeding (if pt able) Postural changes: Position pt fully upright for meals Oral care recommendations: Oral care BID (2x/day)     Recommendations                     Oral care BID   Frequent or constant Supervision/Assistance Dysphagia, oral phase (R13.11)     Continue with current plan of care     [name]  08/23/2024, 1:59 PM

## 2024-08-23 NOTE — Progress Notes (Signed)
 "  Palliative Medicine Inpatient Follow Up Note HPI:  84 y.o. female with past medical history of vascular dementia, anemia of chronic illness, GERD, bilateral ureteral stents, and prior TIA, presents for evaluation of nausea, vomiting, diarrhea for 3 days and fall. After ED evaluation she was admitted on 08/10/2024 with multiple large acute intraparenchymal hemorrhages centered of the left parietal and occipital lobes without midline shift, AVM, fall of unclear mechanism, diarrhea, dehydration, nausea/vomiting, sepsis secondary to GI versus UTI source, vascular dementia, and others.   Palliative care involved to support additional goals of care conversations.   Today's Discussion 08/23/2024  I reviewed the chart notes including nursing notes from [facility], progress notes from Dr. Joette. I also reviewed vital signs which are stable, nursing flowsheets - patients PO's around 75% at lunch, medication administrations record, glucose levels, BMP from today (2/2), and abdominal xray from yesterday showing no acute abnormalities.  I met with [name] this afternoon at bedside. She is able to state who she is but little else in terms of orientation. She does not know where she is nor why she is in the hospital. She denies pain, shortness of breath, or nausea at the time of assessment.   I called and spoke with patients spouse, [name] this late afternoon. We discussed patients condition. He shares that he spoke to Dr. Joette yesterday who has endorsed that patient was not doing well overall and he worries she will not be able to take in sufficient nutrition to sustain life. Patient spouse endorses that he would like to give her a few more days but if but Wednesday she is not taking in nutrition then he is agreeable to taking her NGT out and transitioning the focus of care to comfort measures. He notes that she has been autonomous her whole like and she would not like being care for by others nor would she like  artificial nutrition via a G-tube. Allowed him time and space to express himself given the gravity of the situation.   Questions and concerns addressed/Palliative Support Provided.   Objective Assessment: Vital Signs Vitals:   08/23/24 0754 08/23/24 1154  BP: (!) 130/57 116/64  Pulse: 93 87  Resp:  19  Temp: 98 F (36.7 C) 99.5 F (37.5 C)  SpO2: 98% 98%    Intake/Output Summary (Last 24 hours) at 08/23/2024 1630 Last data filed at 08/23/2024 1434 Gross per 24 hour  Intake --  Output 2250 ml  Net -2250 ml   Last Weight  Most recent update: 08/23/2024  6:04 AM    Weight  49.7 kg (109 lb 9.1 oz)            Gen:  Elderly Caucasian F chronically ill appearing HEENT: drymucous membranes CV: Regular rate and rhythm  PULM: On RA, breathing is even and nonlabored ABD: soft/nontender  EXT: No edema  Neuro: Sleepy this morning but arousable  SUMMARY OF RECOMMENDATIONS   DNAR/DNI  Continue to allow time for outcomes  If patient should neglect to show consistent improvements by Wednesday patients spouse is agreeable to removal of NGT and transition of focus of care  Ongoing PMT support ______________________________________________________________________________________ [name] Palliative Medicine Team Team Cell Phone: [phone] Please utilize secure chat with additional questions, if there is no response within 30 minutes please call the above phone number  I personally spent a total of 35 minutes in the care of the patient today including preparing to see the patient, getting/reviewing separately obtained history, performing  a medically appropriate exam/evaluation, counseling and educating, referring and communicating with other health care professionals, documenting clinical information in the EHR, communicating results, and coordinating care.     "

## 2024-08-24 DIAGNOSIS — I629 Nontraumatic intracranial hemorrhage, unspecified: Secondary | ICD-10-CM | POA: Diagnosis not present

## 2024-08-24 DIAGNOSIS — A419 Sepsis, unspecified organism: Secondary | ICD-10-CM | POA: Diagnosis not present

## 2024-08-24 LAB — GLUCOSE, CAPILLARY
Glucose-Capillary: 126 mg/dL — ABNORMAL HIGH (ref 70–99)
Glucose-Capillary: 130 mg/dL — ABNORMAL HIGH (ref 70–99)
Glucose-Capillary: 139 mg/dL — ABNORMAL HIGH (ref 70–99)
Glucose-Capillary: 155 mg/dL — ABNORMAL HIGH (ref 70–99)
Glucose-Capillary: 138 mg/dL — ABNORMAL HIGH (ref 70–99)
Glucose-Capillary: 121 mg/dL — ABNORMAL HIGH (ref 70–99)

## 2024-08-24 MED ORDER — POTASSIUM CHLORIDE 20 MEQ PO PACK
40.0000 meq | PACK | Freq: Once | ORAL | Status: AC
  Administered 2024-08-24: 40 meq
  Filled 2024-08-24: qty 2

## 2024-08-24 NOTE — Plan of Care (Signed)
" °  Problem: Coping: Goal: Ability to adjust to condition or change in health will improve Outcome: Progressing   Problem: Fluid Volume: Goal: Ability to maintain a balanced intake and output will improve Outcome: Progressing   Problem: Health Behavior/Discharge Planning: Goal: Ability to manage health-related needs will improve Outcome: Progressing   Problem: Metabolic: Goal: Ability to maintain appropriate glucose levels will improve Outcome: Progressing   Problem: Nutritional: Goal: Maintenance of adequate nutrition will improve Outcome: Progressing   Problem: Skin Integrity: Goal: Risk for impaired skin integrity will decrease Outcome: Progressing   Problem: Tissue Perfusion: Goal: Adequacy of tissue perfusion will improve Outcome: Progressing   Problem: Clinical Measurements: Goal: Will remain free from infection Outcome: Progressing Goal: Diagnostic test results will improve Outcome: Progressing Goal: Respiratory complications will improve Outcome: Progressing Goal: Cardiovascular complication will be avoided Outcome: Progressing   Problem: Activity: Goal: Risk for activity intolerance will decrease Outcome: Progressing   Problem: Nutrition: Goal: Adequate nutrition will be maintained Outcome: Progressing   Problem: Coping: Goal: Level of anxiety will decrease Outcome: Progressing   Problem: Elimination: Goal: Will not experience complications related to bowel motility Outcome: Progressing Goal: Will not experience complications related to urinary retention Outcome: Progressing   Problem: Pain Managment: Goal: General experience of comfort will improve and/or be controlled Outcome: Progressing   Problem: Safety: Goal: Ability to remain free from injury will improve Outcome: Progressing   Problem: Skin Integrity: Goal: Risk for impaired skin integrity will decrease Outcome: Progressing   "

## 2024-08-25 DIAGNOSIS — I629 Nontraumatic intracranial hemorrhage, unspecified: Secondary | ICD-10-CM | POA: Diagnosis not present

## 2024-08-25 LAB — CBC
HCT: 29.2 % — ABNORMAL LOW (ref 36.0–46.0)
Hemoglobin: 9.8 g/dL — ABNORMAL LOW (ref 12.0–15.0)
MCH: 32.1 pg (ref 26.0–34.0)
MCHC: 33.6 g/dL (ref 30.0–36.0)
MCV: 95.7 fL (ref 80.0–100.0)
Platelets: 377 10*3/uL (ref 150–400)
RBC: 3.05 MIL/uL — ABNORMAL LOW (ref 3.87–5.11)
RDW: 14.8 % (ref 11.5–15.5)
WBC: 13.1 10*3/uL — ABNORMAL HIGH (ref 4.0–10.5)
nRBC: 0 % (ref 0.0–0.2)

## 2024-08-25 LAB — GLUCOSE, CAPILLARY
Glucose-Capillary: 102 mg/dL — ABNORMAL HIGH (ref 70–99)
Glucose-Capillary: 110 mg/dL — ABNORMAL HIGH (ref 70–99)
Glucose-Capillary: 121 mg/dL — ABNORMAL HIGH (ref 70–99)
Glucose-Capillary: 131 mg/dL — ABNORMAL HIGH (ref 70–99)
Glucose-Capillary: 145 mg/dL — ABNORMAL HIGH (ref 70–99)
Glucose-Capillary: 98 mg/dL (ref 70–99)

## 2024-08-25 LAB — BASIC METABOLIC PANEL WITH GFR
Anion gap: 7 (ref 5–15)
BUN: 20 mg/dL (ref 8–23)
CO2: 26 mmol/L (ref 22–32)
Calcium: 8.8 mg/dL — ABNORMAL LOW (ref 8.9–10.3)
Chloride: 100 mmol/L (ref 98–111)
Creatinine, Ser: 0.67 mg/dL (ref 0.44–1.00)
GFR, Estimated: >60 mL/min
Glucose, Bld: 113 mg/dL — ABNORMAL HIGH (ref 70–99)
Potassium: 4.1 mmol/L (ref 3.5–5.1)
Sodium: 133 mmol/L — ABNORMAL LOW (ref 135–145)

## 2024-08-25 LAB — MAGNESIUM: Magnesium: 2.3 mg/dL (ref 1.7–2.4)

## 2024-08-25 MED ORDER — ADULT MULTIVITAMIN W/MINERALS CH
1.0000 | ORAL_TABLET | Freq: Every day | ORAL | Status: DC
  Administered 2024-08-26: 1 via ORAL
  Filled 2024-08-25: qty 1

## 2024-08-25 MED ORDER — DONEPEZIL HCL 10 MG PO TABS
10.0000 mg | ORAL_TABLET | Freq: Every day | ORAL | Status: DC
  Administered 2024-08-25: 10 mg via ORAL
  Filled 2024-08-25 (×2): qty 1

## 2024-08-25 MED ORDER — LOPERAMIDE HCL 1 MG/7.5ML PO SUSP: 4.0000 mg | Freq: Three times a day (TID) | ORAL | Status: DC | PRN

## 2024-08-25 MED ORDER — INSULIN ASPART 100 UNIT/ML IJ SOLN: 0.0000 [IU] | Freq: Three times a day (TID) | INTRAMUSCULAR | Status: DC

## 2024-08-25 MED ORDER — ACETAMINOPHEN 650 MG RE SUPP: 650.0000 mg | Freq: Four times a day (QID) | RECTAL | Status: DC | PRN

## 2024-08-25 MED ORDER — METOCLOPRAMIDE HCL 10 MG PO TABS
5.0000 mg | ORAL_TABLET | Freq: Three times a day (TID) | ORAL | Status: DC
  Administered 2024-08-25 – 2024-08-26 (×3): 5 mg via ORAL
  Filled 2024-08-25 (×3): qty 1

## 2024-08-25 MED ORDER — MEMANTINE HCL 10 MG PO TABS
5.0000 mg | ORAL_TABLET | Freq: Two times a day (BID) | ORAL | Status: DC
  Administered 2024-08-25 – 2024-08-26 (×2): 5 mg via ORAL
  Filled 2024-08-25 (×2): qty 1

## 2024-08-25 MED ORDER — INSULIN ASPART 100 UNIT/ML IJ SOLN
0.0000 [IU] | Freq: Three times a day (TID) | INTRAMUSCULAR | Status: DC
  Administered 2024-08-25: 1 [IU] via SUBCUTANEOUS
  Filled 2024-08-25: qty 1

## 2024-08-25 MED ORDER — ENSURE PLUS HIGH PROTEIN PO LIQD
237.0000 mL | Freq: Two times a day (BID) | ORAL | Status: DC
  Administered 2024-08-26: 237 mL via ORAL

## 2024-08-25 MED ORDER — ACETAMINOPHEN 325 MG PO TABS: 650.0000 mg | ORAL_TABLET | Freq: Four times a day (QID) | ORAL | Status: DC | PRN

## 2024-08-25 MED ORDER — AMLODIPINE BESYLATE 5 MG PO TABS
5.0000 mg | ORAL_TABLET | Freq: Every day | ORAL | Status: DC
  Administered 2024-08-26: 5 mg via ORAL
  Filled 2024-08-25: qty 1

## 2024-08-25 NOTE — Progress Notes (Signed)
 Physical Therapy Treatment Patient Details Name: [name] MRN: [id number] DOB: 25-Dec-1940 Today's Date: 08/25/2024   History of Present Illness 84 y.o. F adm 08/11/24 with N/V/D and fall. Pt with multiple large acute intraparenchymal hemorrhages in Lt parietal and occipital lobes. PMH: vascular dementia, anemia, DDD, COPD, MDD, macular degeneration    PT Comments  Patient recently up to chair with OT. Willing to work on ambulation. Required mod assist for sit to stand with RUE placed on RW and LUE pushing off armrest. Continues with posterior lean, however improved compared to 2/3. Able to progress to ambulation with light mod assist for balance and for steering RW around obstacles and when turning. Patient easily fatigued and eager to sit over last 10 ft of gait to recliner. Patient refused further standing for balance activities.     If plan is discharge home, recommend the following: A lot of help with walking and/or transfers;A lot of help with bathing/dressing/bathroom;Assistance with feeding;Direct supervision/assist for medications management;Direct supervision/assist for financial management;Assist for transportation;Help with stairs or ramp for entrance;Supervision due to cognitive status   Can travel by private vehicle     Yes  Equipment Recommendations  Rolling walker (2 wheels)    Recommendations for Other Services       Precautions / Restrictions Precautions Precautions: Fall Recall of Precautions/Restrictions: Impaired Precaution/Restrictions Comments: SBP <160; rectal tube; Rt neglect Restrictions Weight Bearing Restrictions Per Provider Order: No     Mobility  Bed Mobility               General bed mobility comments: up in chair    Transfers Overall transfer level: Needs assistance Equipment used: Rolling walker (2 wheels) Transfers: Sit to/from Stand Sit to Stand: Mod assist           General transfer comment: slight posterior bias; improved  with use of RW    Ambulation/Gait Ambulation/Gait assistance: Mod assist Gait Distance (Feet): 40 Feet Assistive device: Rolling walker (2 wheels) Gait Pattern/deviations: Step-through pattern, Decreased stride length, Shuffle, Narrow base of support   Gait velocity interpretation: <1.8 ft/sec, indicate of risk for recurrent falls   General Gait Details: pt requires assist for balance and for steering RW (especially to avoid obstacles and turn)   Stairs             Wheelchair Mobility     Tilt Bed    Modified Rankin (Stroke Patients Only) Modified Rankin (Stroke Patients Only) Pre-Morbid Rankin Score: Slight disability Modified Rankin: Moderately severe disability     Balance Overall balance assessment: Needs assistance Sitting-balance support: Single extremity supported, Feet supported Sitting balance-Leahy Scale: Poor     Standing balance support: Bilateral upper extremity supported, During functional activity, Reliant on assistive device for balance Standing balance-Leahy Scale: Poor                              Communication Communication Communication: Impaired Factors Affecting Communication: Hearing impaired  Cognition Arousal: Alert Behavior During Therapy: Flat affect   PT - Cognitive impairments: History of cognitive impairments                         Following commands: Impaired Following commands impaired: Follows one step commands with increased time, Follows one step commands inconsistently    Cueing Cueing Techniques: Verbal cues, Visual cues, Tactile cues  Exercises      General Comments  Pertinent Vitals/Pain Pain Assessment Pain Assessment: No/denies pain    Home Living                          Prior Function            PT Goals (current goals can now be found in the care plan section) Acute Rehab PT Goals Time For Goal Achievement: 08/25/24 Potential to Achieve Goals:  Fair Progress towards PT goals: Progressing toward goals    Frequency    Min 2X/week      PT Plan      Co-evaluation              AM-PAC PT 6 Clicks Mobility   Outcome Measure  Help needed turning from your back to your side while in a flat bed without using bedrails?: Total Help needed moving from lying on your back to sitting on the side of a flat bed without using bedrails?: Total Help needed moving to and from a bed to a chair (including a wheelchair)?: A Lot Help needed standing up from a chair using your arms (e.g., wheelchair or bedside chair)?: A Lot Help needed to walk in hospital room?: A Lot Help needed climbing 3-5 steps with a railing? : Total 6 Click Score: 9    End of Session Equipment Utilized During Treatment: Gait belt Activity Tolerance: Patient limited by fatigue Patient left: in chair;with call bell/phone within reach;with chair alarm set Nurse Communication: Mobility status PT Visit Diagnosis: Unsteadiness on feet (R26.81);Muscle weakness (generalized) (M62.81);Difficulty in walking, not elsewhere classified (R26.2)     Time: 1020-1041 PT Time Calculation (min) (ACUTE ONLY): 21 min  Charges:    $Gait Training: 8-22 mins PT General Charges $$ ACUTE PT VISIT: 1 Visit                      Macario RAMAN, PT Acute Rehabilitation Services  Office [phone]    Macario SHAUNNA Soja 08/25/2024, 10:50 AM

## 2024-08-25 NOTE — TOC Progression Note (Signed)
 Transition of Care [facility]) - Progression Note    Patient Details  Name: [name] MRN: [id number] Date of Birth: 1941-04-25  [facility] CM/SW Contact  Almarie CHRISTELLA [place]ie, [place] Phone Number: 08/25/2024, 3:13 PM  Clinical Narrative:   CSW spoke with patient's spouse to discuss SNF recommendation. Spouse requested [facility] as they live in [place]. Otherwise, spouse said the only other thing was that he did not want [facility] if [facility] declines. CSW noting patient with improvement in therapy today, CSW faxed referral to [facility] again and asked them to review. Awaiting response. Patient with manual review PASRR, CSW to upload documentation to [facility] for review. CSW to follow.    Expected Discharge Plan: Skilled Nursing Facility Barriers to Discharge: Continued Medical Work up, English As A Second Language Teacher, Engineer, Mining)               Expected Discharge Plan and Services In-house Referral: Clinical Social Work     Living arrangements for the past 2 months: Single Family Home                                       Social Drivers of Health (SDOH) Interventions SDOH Screenings   Food Insecurity: Patient Unable To Answer (03/12/2024)  Housing: Patient Unable To Answer (03/12/2024)  Transportation Needs: Patient Unable To Answer (03/12/2024)  Utilities: Patient Unable To Answer (03/12/2024)  Social Connections: Patient Unable To Answer (03/12/2024)  Recent Concern: Social Connections - Moderately Isolated (01/14/2024)  Tobacco Use: High Risk (06/09/2024)   Received from [facility]    Readmission Risk Interventions    10/20/2023   10:53 AM 10/16/2023    1:47 PM  Readmission Risk Prevention Plan  Post Dischage Appt Complete   Medication Screening Complete   Transportation Screening Complete Complete  PCP or Specialist Appt within 5-7 Days  Complete  Home Care Screening  Complete  Medication Review (RN CM)  Complete

## 2024-08-25 NOTE — Progress Notes (Signed)
 " PROGRESS NOTE  [name]  FMW:[id number] DOB: October 17, 1940 DOA: 08/10/2024 PCP: Patti Mliss LABOR, FNP   Brief Narrative: Patient is a 84 year old female with history of vascular dementia, anemia of chronic illness, GERD, bilateral ureteral stents, TIA who presented with nausea, vomiting, diarrhea, fall.  On presentation she was obtunded, admitted under critical care team.  Found to have multiple large acute intraparenchymal hemorrhage on the left. Occipital lobes.  Neurology, neurosurgery consulted.  After clinical stability, patient was transferred to TRH service on 1/25.  Hospital course remarkable for persistent diarrhea, fever and was started on antibiotics.  PT/OT recommending SNF on discharge.  TOC following  Assessment & Plan:  Principal Problem:   Intracranial hemorrhage (HCC) Active Problems:   Altered mental status   Fall   Diarrhea   Malnutrition of moderate degree   Traumatic left-sided intracerebral hemorrhage with unknown loss of consciousness status (HCC)  Multiple large acute intraparenchymal hemorrhage: Presented as obtunded.  Found to have large acute intraparenchymal hemorrhage centered on the left parietal, supra lobe.  Also found to have subarachnoid hemorrhage, subdural hematoma extension.  Suspected to be from AVM versus traumatic versus hemorrhagic conversion. -CT head 1/20: Multiple large acute IPH left parietal lobe and left occipital lobe, small volume extra axial  extension of hemorrhage.  Possible Intraventricular  extension into left occipital horn. - Repeated CT head 1/21st: Stable extensive left posterior hemisphere intra-axial hemorrhage, stable small regional subarachnoid/subdural space extension of blood.  - MRI brain: Left occipital posterior left temporal lobe hemorrhage, left parietal lobe hemorrhage with layering blood product hyperemia. - CTA consistent with AVM Neurology, neurosurgery were following.  Neurosurgery considering cerebral angiogram in  the future if patient has neurological recovery which is less likely. Currently on Keppra . EEG on 1/20 showed cortical dysfunction, generalized cerebral dysfunction, no seizure activity. Stroke workup completed.  LDL of 70, A1c of 5.4, EF of 65% with moderate dilation of left and right atrial size.  She was previously on aspirin  which has been discontinued.  Nausea/vomiting/diarrhea: Abdominal x-ray unremarkable.  Started on Reglan .  Abdomen remains benign.  Hypertension: Currently on amlodipine   Sepsis secondary to GI source/UTI/concern for colitis/persistent diarrhea: On presentation, she was febrile, tachycardic, had leukocytosis.  Chest x-ray did not show any acute findings.  COVID/flu/RSV negative.  Respiratory viral panel negative.  CT abdomen/pelvis showed diffuse colon wall thickening, area of small bowel thickening suspicious for enterocolitis of infectious or inflammatory etiology.  Also showed features of esophagitis.  Urine culture, blood culture no growth till date.  Patient completed 10 days course of ceftriaxone  and Flagyl  . Patient has been afebrile now.  Has mild leukocytosis.  On Imodium  for diarrhea.  On Protonix  for suspected esophagitis.  Still has rectal tube with loose stool.  Hypokalemia/hypophosphatemia: Currently being monitored and supplemented  Dysphagia: Secondary to stroke.  Was on tube feeding.  Now tube taken out.  Currently on dysphagia 1 diet.  Speech therapy following.  History of vascular dementia/anxiety/depression: Currently on Namenda , Aricept   Anemia of chronic disease: Continue monitor hemoglobin intermittently.  Currently stable.  Goals of care: Poor prognosis and quality of life due to catastrophic neurologic stroke.  Neurological recovery is questionable at this point.  Palliative care following for goals of care.  CODE STATUS DNR.  Husband discussing with palliative care and will determine next steps depending upon her clinical  improvement.  Disposition: PT/OT recommending SNF on discharge.  TOC closely following    Nutrition Problem: Moderate Malnutrition Etiology: social / environmental circumstances  DVT prophylaxis:heparin  injection 5,000 Units Start: 08/13/24 1400 SCDs Start: 08/10/24 2350     Code Status: Limited: Do not attempt resuscitation (DNR) -DNR-LIMITED -Do Not Intubate/DNI   Family Communication: None at bedside  Patient status:Inpatient  Patient is from : Home  Anticipated discharge to:SnF  Estimated DC date:not sure   Consultants: Neurology  Procedures:None  Antimicrobials:  Anti-infectives (From admission, onward)    Start     Dose/Rate Route Frequency Ordered Stop   08/15/24 1400  metroNIDAZOLE  (FLAGYL ) IVPB 500 mg        500 mg 100 mL/hr over 60 Minutes Intravenous Every 12 hours 08/15/24 1303 08/25/24 0341   08/15/24 1330  cefTRIAXone  (ROCEPHIN ) 2 g in sodium chloride  0.9 % 100 mL IVPB        2 g 200 mL/hr over 30 Minutes Intravenous Every 24 hours 08/15/24 1242 08/24/24 1300   08/12/24 2200  vancomycin  (VANCOREADY) IVPB 750 mg/150 mL  Status:  Discontinued        750 mg 150 mL/hr over 60 Minutes Intravenous Every 48 hours 08/10/24 2357 08/11/24 0405   08/11/24 2200  cefTRIAXone  (ROCEPHIN ) 1 g in sodium chloride  0.9 % 100 mL IVPB  Status:  Discontinued        1 g 200 mL/hr over 30 Minutes Intravenous Every 24 hours 08/10/24 2357 08/11/24 1109   08/11/24 2200  cefTRIAXone  (ROCEPHIN ) 2 g in sodium chloride  0.9 % 100 mL IVPB  Status:  Discontinued        2 g 200 mL/hr over 30 Minutes Intravenous Every 24 hours 08/11/24 1109 08/13/24 1002   08/11/24 1000  metroNIDAZOLE  (FLAGYL ) IVPB 500 mg  Status:  Discontinued        500 mg 100 mL/hr over 60 Minutes Intravenous Every 12 hours 08/11/24 0405 08/11/24 1109   08/10/24 2115  ceFEPIme  (MAXIPIME ) 2 g in sodium chloride  0.9 % 100 mL IVPB        2 g 200 mL/hr over 30 Minutes Intravenous  Once 08/10/24 2110 08/10/24 2238    08/10/24 2115  metroNIDAZOLE  (FLAGYL ) IVPB 500 mg        500 mg 100 mL/hr over 60 Minutes Intravenous  Once 08/10/24 2110 08/11/24 0020   08/10/24 2115  vancomycin  (VANCOCIN ) IVPB 1000 mg/200 mL premix  Status:  Discontinued        1,000 mg 200 mL/hr over 60 Minutes Intravenous  Once 08/10/24 2110 08/11/24 0008       Subjective: Patient seen and examined at bedside today.  Hemodynamically stable lying in bed.  She had mittens on the hands.  She remains confused but not agitated.  NG tube has been taken out.  She is on dysphagia 1 diet.  Not in any Distress this morning.  Communicates, obeys commands but is not oriented.  Looks comfortable.  Objective: Vitals:   08/24/24 2002 08/24/24 2320 08/25/24 0432 08/25/24 0735  BP: (!) 131/56 134/68 (!) 126/56 (!) 119/53  Pulse: 95 (!) 105 96 87  Resp: 18 18 18 16   Temp: (!) 100.5 F (38.1 C) 99.2 F (37.3 C) 98.6 F (37 C) 98.5 F (36.9 C)  TempSrc: Oral Oral Oral Oral  SpO2: 100% 100% 100% 98%  Weight:      Height:        Intake/Output Summary (Last 24 hours) at 08/25/2024 0805 Last data filed at 08/25/2024 9388 Gross per 24 hour  Intake 740 ml  Output 1750 ml  Net -1010 ml   Filed Weights   08/22/24  0500 08/23/24 0500 08/24/24 0500  Weight: 51.3 kg 49.7 kg 48.9 kg    Examination:  General exam: Overall comfortable, not in distress, lying on bed HEENT: PERRL Respiratory system:  no wheezes or crackles  Cardiovascular system: S1 & S2 heard, RRR.  Gastrointestinal system: Abdomen is nondistended, soft and nontender. Central nervous system: Alert and awake but not oriented, global weakness, moves upper and lower extremities Extremities: No edema, no clubbing ,no cyanosis, mittens on bilateral hands Skin: No rashes, no ulcers,no icterus     Data Reviewed: I have personally reviewed following labs and imaging studies  CBC: Recent Labs  Lab 08/19/24 0104 08/21/24 0647 08/23/24 0127 08/25/24 0148  WBC 11.5* 13.1* 14.8*  13.1*  HGB 9.9* 10.1* 10.1* 9.8*  HCT 30.1* 29.5* 29.6* 29.2*  MCV 95.6 93.1 94.3 95.7  PLT 317 334 376 377   Basic Metabolic Panel: Recent Labs  Lab 08/19/24 0104 08/21/24 0647 08/23/24 0127 08/25/24 0148  NA 139 134* 132* 133*  K 3.6 3.8 3.3* 4.1  CL 103 100 96* 100  CO2 25 24 26 26   GLUCOSE 122* 132* 88 113*  BUN 25* 19 19 20   CREATININE 0.58 0.63 0.65 0.67  CALCIUM 8.3* 8.5* 8.8* 8.8*  MG 2.0 1.9 2.2 2.3  PHOS 2.6 2.8  --   --      Recent Results (from the past 240 hours)  Culture, blood (Routine X 2) w Reflex to ID Panel     Status: None   Collection Time: 08/15/24  3:58 PM   Specimen: BLOOD  Result Value Ref Range Status   Specimen Description BLOOD LEFT ANTECUBITAL  Final   Special Requests   Final    BOTTLES DRAWN AEROBIC AND ANAEROBIC Blood Culture results may not be optimal due to an inadequate volume of blood received in culture bottles   Culture   Final    NO GROWTH 5 DAYS Performed at [facility], [address] [place], [place] [address]    Report Status 08/20/2024 FINAL  Final     Radiology Studies: No results found.  Scheduled Meds:  amLODipine   5 mg Per Tube Daily   donepezil   10 mg Per Tube QHS   feeding supplement (PROSource TF20)  60 mL Per Tube Daily   heparin  injection (subcutaneous)  5,000 Units Subcutaneous Q8H   insulin  aspart  0-9 Units Subcutaneous Q4H   memantine   5 mg Per Tube BID   metoCLOPramide  (REGLAN ) injection  10 mg Intravenous Once   metoCLOPramide   5 mg Per Tube TID   multivitamin with minerals  1 tablet Per Tube Daily   mouth rinse  15 mL Mouth Rinse 4 times per day   pantoprazole  (PROTONIX ) IV  40 mg Intravenous Q24H   Continuous Infusions:  feeding supplement (OSMOLITE 1.5 CAL) 35 mL/hr at 08/23/24 1750     LOS: 15 days   Ivonne Mustache, MD [facility] P2/10/2024, 8:05 AM  "

## 2024-08-25 NOTE — Progress Notes (Signed)
 Occupational Therapy Treatment Patient Details Name: [name] MRN: [id number] DOB: May 26, 1941 Today's Date: 08/25/2024   History of present illness 84 y.o. F adm 08/11/24 with N/V/D and fall. Pt with multiple large acute intraparenchymal hemorrhages in Lt parietal and occipital lobes. PMH: vascular dementia, anemia, DDD, COPD, MDD, macular degeneration   OT comments  Pt progressing well towards goals. Pt with improvement in arousal and participation level from initial eval. Pt oriented to self and place, however unable to report reason for admission. Progressed to complete grooming task with min assist for cueing to attend to R side. Noted motor planning deficits, with difficulty with hand to mouth action. Pt with L sided gaze preference, with cueing and increased stimuli will visually track to R side. Pt with significant R sided visual field cut, requiring increased multimodal cues to scan and locate items. Improved functional transfer to chair with min assist, +2 for safety. Pt continues to be limited by above listed deficits, continue to recommend <3 hours of skilled rehab daily to optimize independence levels. Will continue to follow acutely.       If plan is discharge home, recommend the following:  Assistance with feeding;Assistance with cooking/housework;Direct supervision/assist for medications management;Direct supervision/assist for financial management;Supervision due to cognitive status;A lot of help with walking and/or transfers;A lot of help with bathing/dressing/bathroom   Equipment Recommendations  Other (comment) (Defer to next venue)       Precautions / Restrictions Precautions Precautions: Fall Recall of Precautions/Restrictions: Impaired Precaution/Restrictions Comments: SBP <160; rectal tube Restrictions Weight Bearing Restrictions Per Provider Order: No       Mobility Bed Mobility Overal bed mobility: Needs Assistance Bed Mobility: Supine to Sit     Supine  to sit: Min assist, +2 for physical assistance, +2 for safety/equipment     General bed mobility comments: Increased time and cueing  for sequencing with BLEs, min assist for trunk    Transfers Overall transfer level: Needs assistance Equipment used: Rolling walker (2 wheels) Transfers: Sit to/from Stand, Bed to chair/wheelchair/BSC Sit to Stand: Min assist, +2 physical assistance, +2 safety/equipment     Step pivot transfers: Min assist, +2 physical assistance, +2 safety/equipment     General transfer comment: Min +2 to rise, once in standing, min assist to sequence steps and manage RW     Balance Overall balance assessment: Needs assistance Sitting-balance support: Feet supported, No upper extremity supported Sitting balance-Leahy Scale: Poor Sitting balance - Comments: L lateral lean, min assist to CGA   Standing balance support: Bilateral upper extremity supported, During functional activity Standing balance-Leahy Scale: Poor Standing balance comment: reliant on RW       ADL either performed or assessed with clinical judgement   ADL Overall ADL's : Needs assistance/impaired Eating/Feeding: Total assistance;Bed level Eating/Feeding Details (indicate cue type and reason): observed NT completing self-feeding prior to initiation of session Grooming: Oral care;Minimal assistance;Sitting Grooming Details (indicate cue type and reason): Assist for cueing to attend to R side of mouth                 Toilet Transfer: Minimal assistance;+2 for physical assistance;+2 for safety/equipment;Stand-pivot;Rolling walker (2 wheels) Toilet Transfer Details (indicate cue type and reason): Short step pivot         Functional mobility during ADLs: Minimal assistance;Rolling walker (2 wheels);+2 for physical assistance;+2 for safety/equipment General ADL Comments: Visual perceptual deficits limiting pt    Extremity/Trunk Assessment Upper Extremity Assessment Upper Extremity  Assessment: Generalized weakness (Using RUE functionally, decreased  motor planning. Cog impacting formal sensation testing)   Lower Extremity Assessment Lower Extremity Assessment: Defer to PT evaluation        Vision   Vision Assessment?: Vision impaired- to be further tested in functional context Additional Comments: L sided gaze preference, R visual field cut ~20-30 degrees past midline. With cueing will turn head past midline and visually track to R side   Perception Perception Perception: Impaired Preception Impairment Details: Inattention/Neglect Perception-Other Comments: R sided inattention   Praxis Praxis Praxis: Impaired Praxis Impairment Details: Motor planning Praxis-Other Comments: decreased smoothness with hand to mouth   Communication Communication Communication: Impaired Factors Affecting Communication: Hearing impaired   Cognition Arousal: Alert Behavior During Therapy: Flat affect Cognition: History of cognitive impairments, Cognition impaired   Orientation impairments: Time, Situation Awareness: Intellectual awareness impaired, Online awareness impaired Memory impairment (select all impairments): Short-term memory, Working memory, Non-declarative long-term memory Attention impairment (select first level of impairment): Selective attention, Divided attention, Alternating attention Executive functioning impairment (select all impairments): Initiation, Organization, Sequencing, Reasoning, Problem solving OT - Cognition Comments: Pt oriented to self and place, unable to state reason for admission. No recall from within session. Noted difficulty with command following, suspect HOH as well as aphasic difficulties. Hx of dementia, no family present to determine true baseline         Following commands: Impaired Following commands impaired: Follows one step commands with increased time, Follows one step commands inconsistently      Cueing   Cueing Techniques:  Verbal cues, Visual cues, Tactile cues             Pertinent Vitals/ Pain       Pain Assessment Pain Assessment: No/denies pain Pain Intervention(s): Monitored during session   Frequency  Min 2X/week        Progress Toward Goals  OT Goals(current goals can now be found in the care plan section)  Progress towards OT goals: Progressing toward goals  Acute Rehab OT Goals Patient Stated Goal: None stated OT Goal Formulation: With patient Time For Goal Achievement: 09/04/24 Potential to Achieve Goals: Good ADL Goals Pt Will Perform Grooming: with mod assist;bed level Additional ADL Goal #1: Pt will tolerate sitting EOB with no more than mod A in prepration for ADLs EOB. Additional ADL Goal #2: Pt will attend to R hemibody and R environment 50% of the time during OT sessions. Additional ADL Goal #3: Pt will follow 1-step commands 50% of the time during ADLs and functional mobility.  Plan         AM-PAC OT 6 Clicks Daily Activity     Outcome Measure   Help from another person eating meals?: A Little Help from another person taking care of personal grooming?: A Little Help from another person toileting, which includes using toliet, bedpan, or urinal?: Total Help from another person bathing (including washing, rinsing, drying)?: A Lot Help from another person to put on and taking off regular upper body clothing?: A Lot Help from another person to put on and taking off regular lower body clothing?: A Lot 6 Click Score: 13    End of Session Equipment Utilized During Treatment: Gait belt;Rolling walker (2 wheels)  OT Visit Diagnosis: Unsteadiness on feet (R26.81);Other abnormalities of gait and mobility (R26.89);History of falling (Z91.81);Other symptoms and signs involving cognitive function   Activity Tolerance Patient tolerated treatment well   Patient Left in chair;with call bell/phone within reach;with chair alarm set;with nursing/sitter in room   Nurse  Communication Mobility status;Other (comment) (  Safety mitts)        Time: 9076-8997 OT Time Calculation (min): 39 min  Charges: OT General Charges $OT Visit: 1 Visit OT Treatments $Self Care/Home Management : 38-52 mins  Adrianne BROCKS, OT  Acute Rehabilitation Services Office [phone] Secure chat preferred   Adrianne GORMAN Savers 08/25/2024, 10:50 AM

## 2024-08-25 NOTE — Progress Notes (Signed)
 " Daily Progress Note   Date: 08/25/2024   Patient Name: [name]  DOB: 1941/05/13  MRN: [id number]  Age / Sex: 84 y.o., female  Attending Physician: Jillian Buttery, MD Primary Care Physician: Patti Mliss LABOR, FNP Admit Date: 08/10/2024 Length of Stay: 15 days  Reason for Follow-up: Establishing goals of care  Past Medical History:  Diagnosis Date   Anemia    Anxiety    Arthritis    Chronic kidney disease    COPD (chronic obstructive pulmonary disease) (HCC)    Dementia (HCC)    vascular dementia   GERD (gastroesophageal reflux disease)    History of kidney stones    Macular degeneration of left eye    Wet and gets injections q monthly   Smoker    1- 1 1/2 ppd  per Husband   Stroke (HCC)    TIAs    Subjective:   Subjective: Chart Reviewed. Updates received. Patient Assessed. Created space and opportunity for patient  and family to explore thoughts and feelings regarding current medical situation.  Today's Discussion: Today before meeting with the patient/family, I reviewed the chart notes including TOC note from yesterday, hospital stay, PT/OT note from today. I also reviewed vital signs, nursing flowsheets, medication administrations record, labs, and imaging. Labs reviewed include WBC continued improvement to 13.1 today.  Continued hyponatremia but stable at 133 today.  Today saw the patient at bedside, she appears remarkably improved compared to my last visit.  She is sitting in the bedside chair, when I greeted her she greets me warmly, makes and keeps eye contact.  She is communicative, answers questions appropriately.  The core track is since been removed.  She denies pain, nausea, vomiting.  We talked about her appetite and she says she has been hungry and it appears that she is eating well.  She does require one-on-one feeding, when it is done, she generally is eating 75 to 80% of her meals.  After seeing the patient I called her husband.  We had a discussion  about her progress over the past several days.  He is optimistic.  We talked about possible plan for discharge to SNF, TOC is working on placement.  We spent time talking about goals of care and confirmed desire for no feeding tubes that she would not want to live like this.  He is encouraged at her continued improvement.  I shared that palliative medicine would follow-up in a couple days, encouraged him to call us  for any questions or concerns before then. I provided emotional and general support through therapeutic listening, empathy, sharing of stories, and other techniques. I answered all questions and addressed all concerns to the best of my ability.  Review of Systems  Constitutional:        Denies pain in general  Respiratory:  Negative for shortness of breath.   Gastrointestinal:  Negative for abdominal pain, nausea and vomiting.    Objective:   Primary Diagnoses: Present on Admission: **None**   Vital Signs:  BP (!) 116/58 (BP Location: Right Arm)   Pulse (!) 101   Temp 98.3 F (36.8 C) (Oral)   Resp 18   Ht 4' 10 (1.473 m)   Wt 48.9 kg   SpO2 99%   BMI 22.53 kg/m   Physical Exam Vitals and nursing note reviewed.  Constitutional:      General: She is not in acute distress.    Appearance: She is ill-appearing.     Comments: Sitting in  the bedside chair  HENT:     Head: Normocephalic and atraumatic.  Cardiovascular:     Rate and Rhythm: Normal rate.  Pulmonary:     Effort: Pulmonary effort is normal. No respiratory distress.  Abdominal:     General: Abdomen is flat. Bowel sounds are normal. There is no distension.     Palpations: Abdomen is soft.  Skin:    General: Skin is warm and dry.  Neurological:     General: No focal deficit present.     Mental Status: She is alert.  Psychiatric:        Mood and Affect: Mood normal.        Behavior: Behavior normal.     Palliative Assessment/Data: 50-60%   Existing Vynca/ACP Documentation: None  Assessment &  Plan:   HPI/Patient Profile:  84 y.o. female with past medical history of vascular dementia, anemia of chronic illness, GERD, bilateral ureteral stents, and prior TIA, presents for evaluation of nausea, vomiting, diarrhea for 3 days and fall. After ED evaluation she was admitted on 08/10/2024 with multiple large acute intraparenchymal hemorrhages centered of the left parietal and occipital lobes without midline shift, AVM, fall of unclear mechanism, diarrhea, dehydration, nausea/vomiting, sepsis secondary to GI versus UTI source, vascular dementia, and others.    08/25/2024: Today the patient appears significantly improved compared to my last visit, sitting in a bedside chair, more alert and communicative.  Apparently she is eating well with one-on-one feeding.  PT/OT notes improvement, feel that she can likely have some more improvement with rehab.  COC working on SNF/rehab placement.  Goals remain consistent with the patient's husband including no feeding tubes, continued encouragement of oral intake, attempt at rehab.  SUMMARY OF RECOMMENDATIONS   DNR-Limited Continue to encourage patient's oral intake Continue therapy with goal of strengthening and improvement TOC working on SNF/rehab placement Continued emotional support of the patient's husband Palliative medicine will follow-up in a couple days  Symptom Management:  Per primary team Palliative medicine is available to assist as needed  Code Status: DNR - Limited (DNR/DNI)  Prognosis: > 12 months  Discharge Planning: SNF/Rehab  Discussed with: Patient, family, medical team, nursing team  Thank you for allowing us  to participate in the care of BAYLOR TEEGARDEN PMT will continue to support holistically.  Time Total: 35 min  Detailed review of medical records (labs, imaging, vital signs), medically appropriate exam, discussed with treatment team, counseling and education to patient, family, & staff, documenting clinical information,  medication management, coordination of care  Camellia Kays, NP Palliative Medicine Team  Team Phone # [phone] (Nights/Weekends)  03/20/2021, 8:17 AM  "

## 2024-08-26 DIAGNOSIS — I629 Nontraumatic intracranial hemorrhage, unspecified: Secondary | ICD-10-CM | POA: Diagnosis not present

## 2024-08-26 LAB — GLUCOSE, CAPILLARY
Glucose-Capillary: 117 mg/dL — ABNORMAL HIGH (ref 70–99)
Glucose-Capillary: 110 mg/dL — ABNORMAL HIGH (ref 70–99)
Glucose-Capillary: 113 mg/dL — ABNORMAL HIGH (ref 70–99)

## 2024-08-26 LAB — CBC
HCT: 29.7 % — ABNORMAL LOW (ref 36.0–46.0)
Hemoglobin: 10 g/dL — ABNORMAL LOW (ref 12.0–15.0)
MCH: 32.2 pg (ref 26.0–34.0)
MCHC: 33.7 g/dL (ref 30.0–36.0)
MCV: 95.5 fL (ref 80.0–100.0)
Platelets: 411 10*3/uL — ABNORMAL HIGH (ref 150–400)
RBC: 3.11 MIL/uL — ABNORMAL LOW (ref 3.87–5.11)
RDW: 14.7 % (ref 11.5–15.5)
WBC: 12.6 10*3/uL — ABNORMAL HIGH (ref 4.0–10.5)
nRBC: 0 % (ref 0.0–0.2)

## 2024-08-26 MED ORDER — ENSURE PLUS HIGH PROTEIN PO LIQD: 237.0000 mL | Freq: Two times a day (BID) | ORAL | Status: AC

## 2024-08-26 MED ORDER — LOPERAMIDE HCL 1 MG/7.5ML PO SUSP: 4.0000 mg | Freq: Three times a day (TID) | ORAL | Status: AC | PRN

## 2024-08-26 MED ORDER — AMLODIPINE BESYLATE 5 MG PO TABS: 5.0000 mg | ORAL_TABLET | Freq: Every day | ORAL | Status: AC

## 2024-08-26 NOTE — Discharge Summary (Signed)
 Physician Discharge Summary  [name] FMW:[id number] DOB: Jan 12, 1941 DOA: 08/10/2024  PCP: Patti Mliss LABOR, FNP  Admit date: 08/10/2024 Discharge date: 08/26/2024  Admitted From: Home Disposition:  SNF  Discharge Condition:Stable CODE STATUS:DNR Diet recommendation:  Dysphagia 1  Brief/Interim Summary: Patient is a 84 year old female with history of vascular dementia, anemia of chronic illness, GERD, bilateral ureteral stents, TIA who presented with nausea, vomiting, diarrhea, fall.  On presentation she was obtunded, admitted under critical care team.  Found to have multiple large acute intraparenchymal hemorrhage on the left. Occipital lobes.  Neurology, neurosurgery consulted.  After clinical stability, patient was transferred to [facility] service on 1/25.  Hospital course remarkable for persistent diarrhea, fever and was started on antibiotics.  Now afebrile, hemodynamically stable.  PT/OT recommending SNF on discharge.  Medically stable for discharge today.  Following problems were addressed during the hospitalization:  Multiple large acute intraparenchymal hemorrhage: Presented as obtunded.  Found to have large acute intraparenchymal hemorrhage centered on the left parietal, supra lobe.  Also found to have subarachnoid hemorrhage, subdural hematoma extension.  Suspected to be from AVM versus traumatic versus hemorrhagic conversion. -CT head 1/20: Multiple large acute IPH left parietal lobe and left occipital lobe, small volume extra axial  extension of hemorrhage.  Possible Intraventricular  extension into left occipital horn. - Repeated CT head 1/21st: Stable extensive left posterior hemisphere intra-axial hemorrhage, stable small regional subarachnoid/subdural space extension of blood.  - MRI brain: Left occipital posterior left temporal lobe hemorrhage, left parietal lobe hemorrhage with layering blood product hyperemia. - CTA consistent with AVM Neurology, neurosurgery were following.   Neurosurgery considering cerebral angiogram in the future if patient has neurological recovery which is less likely. Currently on Keppra . EEG on 1/20 showed cortical dysfunction, generalized cerebral dysfunction, no seizure activity. Stroke workup completed.  LDL of 70, A1c of 5.4, EF of 65% with moderate dilation of left and right atrial size.  She was previously on aspirin  which has been discontinued.   Nausea/vomiting/diarrhea: Abdominal x-ray unremarkable.  Abdomen remains benign.  Diarrhea improving   Hypertension: Currently on amlodipine    Sepsis secondary to GI source/UTI/concern for colitis/persistent diarrhea: On presentation, she was febrile, tachycardic, had leukocytosis.  Chest x-ray did not show any acute findings.  COVID/flu/RSV negative.  Respiratory viral panel negative.  CT abdomen/pelvis showed diffuse colon wall thickening, area of small bowel thickening suspicious for enterocolitis of infectious or inflammatory etiology.  Also showed features of esophagitis.  Urine culture, blood culture no growth till date.  Patient completed 10 days course of ceftriaxone  and Flagyl  . Patient has been afebrile now.  Has mild leukocytosis.  On as needed Imodium  for diarrhea.  On Protonix  for suspected esophagitis.    Dysphagia: Secondary to stroke.  Was on tube feeding.  Now tube taken out.  Currently on dysphagia 1 diet.  Speech therapy was following.   History of vascular dementia/anxiety/depression: Currently on Namenda , Aricept    Anemia of chronic disease: Continue monitor hemoglobin intermittently.  Currently stable.   Goals of care: Poor prognosis and quality of life due to catastrophic neurologic stroke.  Neurological recovery is questionable at this point.  Palliative care were  following for goals of care.  CODE STATUS DNR.    Disposition: PT/OT recommending SNF on discharge.    Discharge Diagnoses:  Principal Problem:   Intracranial hemorrhage (HCC) Active Problems:   Altered  mental status   Fall   Diarrhea   Malnutrition of moderate degree   Traumatic left-sided intracerebral hemorrhage  with unknown loss of consciousness status [facility])    Discharge Instructions  Discharge Instructions     Diet general   Complete by: As directed    Dysphagia 1   Discharge instructions   Complete by: As directed    1)Please take your medications as instructed 2)Do a CBC and BMP tests in a week 3)Follow up with neurology as an outpatient   Increase activity slowly   Complete by: As directed       Allergies as of 08/26/2024       Reactions   Other Diarrhea   All gel capsules cause severe diarrhea - does not matter what medication. Past reactions include AREDS, vitamin E capsules, vitamin D3 capsules, Advil  Liqui-gels. Spouse unsure if gelatin is the cause as patient has eaten Jello with no reaction.   Sulfa Antibiotics Itching, Rash        Medication List     STOP taking these medications    aspirin  EC 81 MG tablet   ibuprofen  200 MG tablet Commonly known as: ADVIL    Lexapro  20 MG tablet Generic drug: escitalopram        TAKE these medications    amLODipine  5 MG tablet Commonly known as: NORVASC  Take 1 tablet (5 mg total) by mouth daily. Start taking on: August 27, 2024   bismuth subsalicylate 262 MG/15ML suspension Commonly known as: PEPTO BISMOL Take 30 mLs by mouth every 6 (six) hours as needed for indigestion or diarrhea or loose stools.   BLINK TEARS OP Place 1 drop into both eyes See admin instructions. Administer 1 drop into each eye twice daily and up to 4 times daily as needed for dry or irritated eyes.   CALCIUM PO Take 1 tablet by mouth daily.   donepezil  10 MG tablet Commonly known as: ARICEPT  Take 10 mg by mouth daily.   feeding supplement Liqd Take 237 mLs by mouth 2 (two) times daily between meals.   loperamide  HCl 1 MG/7.5ML suspension Commonly known as: IMODIUM  Take 30 mLs (4 mg total) by mouth 3 (three) times daily  as needed for diarrhea or loose stools.   memantine  5 MG tablet Commonly known as: NAMENDA  Take 5 mg by mouth in the morning and at bedtime.   ONE A DAY WOMEN 50 PLUS PO Take 1 tablet by mouth daily.   pantoprazole  40 MG tablet Commonly known as: Protonix  Take 1 tablet (40 mg total) by mouth daily.   PROBIOTIC PO Take 1 capsule by mouth daily.   VITAMIN B-12 PO Take 1 tablet by mouth daily.   VITAMIN D-3 PO Take 1 tablet by mouth daily.        Contact information for after-discharge care     Destination     [facility] .   Service: Skilled Nursing Contact information: [address] [place]  [address] [phone]                    Allergies[1]  Consultations: Neurosurgery, neurology   Procedures/Studies: DG Abd Portable 1V Result Date: 08/22/2024 EXAM: 1 VIEW XRAY OF THE ABDOMEN 08/22/2024 07:46:00 PM COMPARISON: None available. CLINICAL HISTORY: Nausea/vomiting. FINDINGS: LINES, TUBES AND DEVICES: Weighted enteric tube in place with tip in the stomach. BOWEL: Nonobstructive bowel gas pattern. SOFT TISSUES: Cholecystectomy clips in the right upper quadrant. Chain sutures in the right lower quadrant. Calcified uterine fibroid. BONES: No acute fracture. IMPRESSION: 1. Weighted enteric tube tip in the stomach. 2. No acute radiographic abnormality.  Electronically signed by: Norman Gatlin MD 08/22/2024 07:51 PM EST RP Workstation: [id number]   DG Swallowing Func-Speech Pathology Result Date: 08/20/2024 Table formatting from the original result was not included. Modified Barium Swallow Study Patient Details Name: [name] MRN: [id number] Date of Birth: [birth year] Today's Date: 08/20/2024 HPI/PMH: HPI: 84 y.o. F adm 08/11/24 with N/V/D and fall. Pt with multiple large acute intraparenchymal hemorrhages in Lt parietal and occipital lobes. PMH: vascular dementia, anemia, DDD, COPD, MDD, macular degeneration Clinical  Impression: Moderate oral dysphagia; functional pharyngeal phase without penetration or aspiration. Recommend Dys 1 (puree), thin liquids, meds crushed. Clinical Impression: Pt exhibits moderate oral dysphagia and functional pharyngeal phase of swallow. Orally there was decreased bolus contol, transport and delays with all consistencies. Majority of trials the bolus was intact with trace amount thin spilling to valleculae. Her transport included repetitive lingual rocking and delayed transit. Pharyngeal mobility was functional despite decreased laryngeal elevation her laryngeal vestibule closure was timely and complete to prevent penetration and aspiration. Apparent penetration was suspected, however on closer inspection, material following honey thick was localized along the upper part of aryepiglottic folds and did not enter the airway.  Mild esophaeal retention. Factors that may increase risk of adverse event in presence of aspiration Noe & Lianne 2021): Factors that may increase risk of adverse event in presence of aspiration Noe & Lianne 2021): Frail or deconditioned; Reduced cognitive function; Limited mobility Recommendations/Plan: Swallowing Evaluation Recommendations Swallowing Evaluation Recommendations Recommendations: PO diet PO Diet Recommendation: Dysphagia 1 (Pureed); Thin liquids (Level 0) Liquid Administration via: Cup; Straw Medication Administration: Crushed with puree Supervision: Staff to assist with self-feeding Swallowing strategies  : Slow rate; Small bites/sips Postural changes: Position pt fully upright for meals Oral care recommendations: Oral care BID (2x/day) Recommended consults: Consider GI consultation Treatment Plan Treatment Plan Treatment recommendations: Therapy as outlined in treatment plan below Functional status assessment: Patient has had a recent decline in their functional status and demonstrates the ability to make significant improvements in function in a  reasonable and predictable amount of time. Treatment frequency: Min 2x/week Treatment duration: 2 weeks Interventions: Diet toleration management by SLP; Trials of upgraded texture/liquids; Patient/family education; Compensatory techniques Recommendations Recommendations for follow up therapy are one component of a multi-disciplinary discharge planning process, led by the attending physician.  Recommendations may be updated based on patient status, additional functional criteria and insurance authorization. Assessment: Orofacial Exam: Orofacial Exam Oral Cavity: Oral Hygiene: WFL Oral Cavity - Dentition: Missing dentition (missing upper, some lower) Orofacial Anatomy: -- (will assess further) Oral Motor/Sensory Function: -- (will asses further) Anatomy: Anatomy: WFL Boluses Administered: Boluses Administered Boluses Administered: Thin liquids (Level 0); Mildly thick liquids (Level 2, nectar thick); Moderately thick liquids (Level 3, honey thick); Puree  Oral Impairment Domain: Oral Impairment Domain Lip Closure: Interlabial escape, no progression to anterior lip Tongue control during bolus hold: Posterior escape of less than half of bolus (x 1 with thin) Bolus preparation/mastication: -- (NT) Bolus transport/lingual motion: Repetitive/disorganized tongue motion Oral residue: Residue collection on oral structures Location of oral residue : Tongue Initiation of pharyngeal swallow : Pyriform sinuses  Pharyngeal Impairment Domain: Pharyngeal Impairment Domain Soft palate elevation: No bolus between soft palate (SP)/pharyngeal wall (PW) Laryngeal elevation: Partial superior movement of thyroid  cartilage/partial approximation of arytenoids to epiglottic petiole Anterior hyoid excursion: Complete anterior movement Epiglottic movement: Complete inversion Laryngeal vestibule closure: Complete, no air/contrast in laryngeal vestibule Pharyngeal stripping wave : Present - complete Pharyngeal contraction (A/P view only): N/A  Pharyngoesophageal  segment opening: Partial distention/partial duration, partial obstruction of flow Tongue base retraction: No contrast between tongue base and posterior pharyngeal wall (PPW) Pharyngeal residue: Trace residue within or on pharyngeal structures Location of pharyngeal residue: Pyriform sinuses  Esophageal Impairment Domain: Esophageal Impairment Domain Esophageal clearance upright position: Esophageal retention Pill: No data recorded Penetration/Aspiration Scale Score: Penetration/Aspiration Scale Score 1.  Material does not enter airway: Thin liquids (Level 0); Mildly thick liquids (Level 2, nectar thick); Moderately thick liquids (Level 3, honey thick); Puree Compensatory Strategies: Compensatory Strategies Compensatory strategies: No   General Information: Caregiver present: No  Diet Prior to this Study: NPO; Cortrak/Small bore NG tube   Temperature : Normal   Respiratory Status: WFL   Supplemental O2: None (Room air)   History of Recent Intubation: No  Behavior/Cognition: Other (Comment) (mildly drowsy) Self-Feeding Abilities: Able to self-feed Baseline vocal quality/speech: Normal No data recorded No data recorded Exam Limitations: No limitations Goal Planning: Prognosis for improved oropharyngeal function: Good No data recorded No data recorded No data recorded Consulted and agree with results and recommendations: Pt unable/family or caregiver not available; Physician; Nurse Pain: Pain Assessment Pain Assessment: No/denies pain Breathing: 0 Negative Vocalization: 0 Facial Expression: 0 Body Language: 0 Consolability: 0 PAINAD Score: 0 Pain Intervention(s): Monitored during session End of Session: Start Time:SLP Start Time (ACUTE ONLY): 1012 Stop Time: SLP Stop Time (ACUTE ONLY): 1024 Time Calculation:SLP Time Calculation (min) (ACUTE ONLY): 12 min Charges: SLP Evaluations $ SLP Speech Visit: 1 Visit SLP Evaluations $MBS Swallow: 1 Procedure $Swallowing Treatment: 1 Procedure SLP visit  diagnosis: SLP Visit Diagnosis: Dysphagia, oral phase (R13.11) Past Medical History: Past Medical History: Diagnosis Date  Anemia   Anxiety   Arthritis   Chronic kidney disease   COPD (chronic obstructive pulmonary disease) (HCC)   Dementia (HCC)   vascular dementia  GERD (gastroesophageal reflux disease)   History of kidney stones   Macular degeneration of left eye   Wet and gets injections q monthly  Smoker   1- 1 1/2 ppd  per Husband  Stroke [facility])   TIAs Past Surgical History: Past Surgical History: Procedure Laterality Date  CHOLECYSTECTOMY  10/24/2010  Laparoscopic  COLECTOMY  07/22/2004  BENIGN TUMOR  CYSTOSCOPY W/ URETERAL STENT PLACEMENT Left 01/14/2024  Procedure: CYSTOSCOPY, WITH RETROGRADE PYELOGRAM AND URETERAL STENT INSERTION;  Surgeon: Elisabeth Valli BIRCH, MD;  Location: [facility] OR;  Service: Urology;  Laterality: Left;  CYSTOSCOPY/URETEROSCOPY/HOLMIUM LASER/STENT PLACEMENT Left 03/11/2024  Procedure: CYSTOSCOPY/URETEROSCOPY/HOLMIUM LASER/STENT PLACEMENT;  Surgeon: Elisabeth Valli BIRCH, MD;  Location: [facility] ORS;  Service: Urology;  Laterality: Left;  CYSTOSCOPY/LEFT URETEROSCOPY/HOLMIUM LASER/STENT EXCHANGE  DILATION AND CURETTAGE OF UTERUS    EYE SURGERY Bilateral   cataract extraction  TRANSESOPHAGEAL ECHOCARDIOGRAM (CATH LAB) N/A 10/17/2023  Procedure: TRANSESOPHAGEAL ECHOCARDIOGRAM;  Surgeon: Pietro Redell RAMAN, MD;  Location: [facility] INVASIVE CV LAB;  Service: Cardiovascular;  Laterality: N/A; [name] 08/20/2024, 6:31 PM  DG CHEST PORT 1 VIEW Result Date: 08/15/2024 CLINICAL DATA:  Fever. EXAM: PORTABLE CHEST 1 VIEW COMPARISON:  Chest radiograph dated 08/10/2024 FINDINGS: Feeding tube extends below the diaphragm with tip beyond the inferior margin of the image. There is a small left pleural effusion and left lung base atelectasis. Faint diffuse interstitial densities likely represent edema. Pneumonia is not excluded. No pneumothorax. Stable cardiac silhouette. Atherosclerotic calcification of the  aorta. No acute osseous pathology. IMPRESSION: 1. Small left pleural effusion and left lung base atelectasis. 2. Probable mild interstitial edema. Electronically Signed   By: Vanetta Shelia HERO.D.  On: 08/15/2024 09:06   CT ANGIO HEAD W OR WO CONTRAST Result Date: 08/12/2024 EXAM: CTA Head without and with Intravenous Contrast CLINICAL HISTORY: Subarachnoid hemorrhage [facility]). Subarachnoid hemorrhage. TECHNIQUE: Axial CTA images of the head without and with intravenous contrast. MIP reconstructed images were created and reviewed. Dose reduction technique was used including one or more of the following: automated exposure control, adjustment of mA and kV according to patient size, and/or iterative reconstruction. CONTRAST: Without and with; 75 mL (iohexol  (OMNIPAQUE ) 350 MG/ML injection 75 mL IOHEXOL  350 MG/ML SOLN) COMPARISON: MRI head and CT head 08/11/2024. FINDINGS: INTERNAL CAROTID ARTERIES: There is a 1.5 mm inferiorly directed outpouching along the right supraclinoid ICA at the origin of the posterior communicating artery, favored to reflect an infundibulum. Finding is unrelated to areas of intracranial hemorrhage. The intracranial ICAs are patent with no significant stenosis. No occlusion. No aneurysm. ANTERIOR CEREBRAL ARTERIES: No significant stenosis. No occlusion. No aneurysm. MIDDLE CEREBRAL ARTERIES: No significant stenosis. No occlusion. No aneurysm. POSTERIOR CEREBRAL ARTERIES: There is a region of asymmetric vasculature in the superior left occipital lobe near the region of intracranial hemorrhage. There are a few small caliber vessels likely supplied via D4 branches of the left PCA with an additional larger venous appearing component. Suspected venous component appears to drain posteriorly to the superior sagittal sinus. Finding is best seen on series 10 image 83. Arterial supply noted on series 10 image 86. No significant stenosis. No occlusion. No aneurysm. BASILAR ARTERY: No significant  stenosis. No occlusion. No aneurysm. VERTEBRAL ARTERIES: No significant stenosis. No occlusion. No aneurysm. SOFT TISSUES: Partially visualized nasoenteric tube. No masses or lymphadenopathy. BONES: No acute osseous abnormality. INTRACRANIAL FINDINGS: Redemonstrated areas of intracranial hemorrhage in the left cerebral hemisphere. There are additional areas of layering blood products within the ventricles. IMPRESSION: 1. Asymmetric vasculature in the superior left occipital lobe near the region of hemorrhage, suspicious for arteriovenous malformation. Consider catheter angiography for further evaluation. 2. There is arterial supply from the left PCA. Suspected venous component appears to drain into the superior sagittal sinus. 3. No evidence of intracranial aneurysm, flow-limiting stenosis, or arterial dissection. 4. Infundibulum at the right PCOM origin. 5. Similar intraparenchymal hemorrhage in the posterior left cerebral hemisphere. Intraventricular hemorrhage with increased blood products layering in the right lateral ventricle, likely reflecting redistribution. New subarachnoid hemorrhage in the right Sylvian fissure, also likely secondary to redistribution. Electronically signed by: Donnice Mania MD 08/12/2024 12:23 PM EST RP Workstation: [id number]   EEG adult Result Date: 08/12/2024 Shelton Arlin KIDD, MD     08/12/2024 12:04 PM Patient Name: [name] MRN: [id number] Epilepsy Attending: Arlin KIDD Shelton Referring Physician/Provider: Voncile Isles, MD Date: 08/12/2024 Duration: 22.57 mins Patient history: 84 y.o. with large L occipital IPH. EEG to evaluate for seizure Level of alertness: Asleep/ lethargic AEDs during EEG study: LEV Technical aspects: This EEG study was done with scalp electrodes positioned according to the 10-20 International system of electrode placement. Electrical activity was reviewed with band pass filter of 1-70Hz , sensitivity of 7 uV/mm, display speed of 16mm/sec with a 60Hz   notched filter applied as appropriate. EEG data were recorded continuously and digitally stored.  Video monitoring was available and reviewed as appropriate. Description: EEG showed continuous generalized and lateralized left hemisphere 3 to 6 Hz theta-delta slowing. Hyperventilation and photic stimulation were not performed.   ABNORMALITY - Continuous slow, generalized and lateralized left hemisphere IMPRESSION: This study is suggestive of cortical dysfunction arising from left hemisphere likely secondary to underlying  structural abnormality. Additionally there is generalized cerebral dysfunction (encephalopathy). No seizures or epileptiform discharges were seen throughout the recording. Priyanka O Yadav   MR BRAIN W WO CONTRAST Result Date: 08/11/2024 EXAM: MRI BRAIN WITH AND WITHOUT CONTRAST 08/11/2024 05:42:00 PM TECHNIQUE: Multiplanar multisequence MRI of the head/brain was performed with and without the administration of intravenous contrast. COMPARISON: Earlier same day CT head and MRI head 01/07/2023. CLINICAL HISTORY: Subdural hematoma. FINDINGS: BRAIN AND VENTRICLES: Acute intracranial hemorrhage: Irregular focus of susceptibility in the left occipital lobe extending into the posterior left temporal lobe, measuring approximately 5.5 x 3.1 cm, similar to the recent CT. Intraventricular extension of hemorrhage into the adjacent left occipital horn with layering blood products and expansion of the occipital horn noted, similar to CT. Intraventricular blood products also extend into the atrium and posterior body of the left lateral ventricle. Additional region of hemorrhage noted in the left parietal lobe with suggestion of layering blood products at this level as well, measuring 4.9 x 2.4 cm, similar to prior. A few additional areas of hemorrhage are present in the periventricular white matter of the posterior left temporal lobe. Small areas of subdural and subarachnoid hemorrhage are again noted. On T1  images, there are areas of hyperintensity favored to reflect intrinsic T1 hyperintensity in the setting of hemorrhage. Chronic changes: Extensive T2 and FLAIR hyperintensity throughout the periventricular and subcortical white matter suggestive of chronic microvascular ischemic changes. There is generalized parenchymal volume loss. Ventricular/Mass effect: Expansion of the left occipital horn and mass effect on the atrium of the left lateral ventricle. Postcontrast findings: Postcontrast images are somewhat limited due to motion artifact. Within these limitations, there are enhancing vessels noted along the occipital and parietal lobes which may reflect hyperemia in the setting of hemorrhage. No definite vascular malformation identified. Consider CTA for further evaluation. No acute infarct. ORBITS: Bilateral lens replacement. SINUSES: No significant abnormality. BONES AND SOFT TISSUES: Normal bone marrow signal. No soft tissue abnormality. IMPRESSION: 1. Left occipital and posterior left temporal lobe hemorrhage with intraventricular extension into the adjacent left occipital horn, atrium, and posterior body of the left lateral ventricle, similar to recent CT. 2. Left parietal lobe hemorrhage with suggestion of layering blood products, similar to prior. 3. Enhancing vessels along the occipital and parietal lobes, possibly reflecting hyperemia in the setting of hemorrhage. No definite vascular malformation identified. Consider CTA for further evaluation. Electronically signed by: Donnice Mania MD 08/11/2024 08:23 PM EST RP Workstation: [id number]   CT Cervical Spine Wo Contrast Result Date: 08/11/2024 EXAM: CT CERVICAL SPINE WITHOUT CONTRAST 08/10/2024 10:57:21 PM TECHNIQUE: CT of the cervical spine was performed without the administration of intravenous contrast. Multiplanar reformatted images are provided for review. Automated exposure control, iterative reconstruction, and/or weight based adjustment of the  mA/kV was utilized to reduce the radiation dose to as low as reasonably achievable. COMPARISON: head CT reported separately CLINICAL HISTORY: This study identified as missing report at 0631 hours on 08/10/2024. 84 year old female with acute intracranial hemorrhage and fall. FINDINGS: BONES AND ALIGNMENT: Mild straightening of cervical lordosis. Mild degenerative appearing anterolisthesis of C4 on C5 with associated right-sided chronic facet arthropathy. No acute fracture. DEGENERATIVE CHANGES: Severe chronic disc and endplate degeneration at C5-C6 and C6-C7 including some vacuum disc phenomenon. SOFT TISSUES: Negative visible non-contrast neck soft tissues. Trace intravenous gas at the thoracic inlet on the right which is likely IV access related. Mild apical lung scarring. IMPRESSION: 1. No acute cervical spine fracture or dislocation. 2. Chronic cervical spine degeneration.  Electronically signed by: Helayne Hurst MD 08/11/2024 06:35 AM EST RP Workstation: [id number]   CT HEAD WO CONTRAST ( ) Result Date: 08/11/2024 EXAM: CT HEAD WITHOUT CONTRAST 08/11/2024 06:19:14 AM TECHNIQUE: CT of the head was performed without the administration of intravenous contrast. Automated exposure control, iterative reconstruction, and/or weight based adjustment of the mA/kV was utilized to reduce the radiation dose to as low as reasonably achievable. COMPARISON: CT head 08/10/2024 and brain MRI 01/07/2023. CLINICAL HISTORY: 84 year old female with acute intracranial hemorrhage on head CT yesterday. Patient had a fall striking her head. FINDINGS: BRAIN AND VENTRICLES: Intraaxial hemorrhages in the posterior left hemisphere, some with layering hematocrit level (series 2 image 19) redemonstrated and involving the left occipital and parietal lobes, junction of those lobes with the posterior left temporal lobe. Discontinuous blood products make that discontinuous intra axial blood products and compass an area of roughly 53 x 44 x 62 mm  (AP x transverse x cc). Estimated volume 75 mL. Superimposed small volume of subarachnoid or subdural extension of blood along the posterior falx and the left tentorium (coronal image 24) has not significantly changed. No intraventricular extension is evident. Stable mass effect on the left occipital horn and the atrium of the left lateral ventricle. No midline shift. Confluent surrounding hypodense edema, but similar confluent and widespread bilateral cerebral white matter hypodensity in both hemispheres. No new areas of intracranial hemorrhage. Stable gray white differentiation. No suspicious intracranial vascular hyperdensity. No hydrocephalus. ORBITS: No acute abnormality. SINUSES: No acute abnormality. SOFT TISSUES AND SKULL: No acute soft tissue abnormality. No skull fracture. Mild for age calcified atherosclerosis at the skull base. No evidence of amyloid angiopathy on 2024 MRI. Query acute coagulopathy. Tympanic cavities, mastoids and paranasal sinuses remain well aerated. No discrete orbital scalp soft tissue injury is identified. IMPRESSION: 1. Stable extensive left posterior hemisphere intra-axial hemorrhages with discontinuous hyperdense blood products roughly estimated at 76 mL. Stable small regional subarachnoid/subdural space extension of blood. Stable regional edema and relatively mild mass effect with no midline shift. 2. No evidence of amyloid angiopathy on a 2024 MRI. Query acute coagulopathy. 3. Underlying chronically advanced white matter disease. No new intracranial abnormality. Electronically signed by: Helayne Hurst MD 08/11/2024 06:30 AM EST RP Workstation: [id number]   CT Head Wo Contrast Result Date: 08/10/2024 EXAM: CT HEAD WITHOUT CONTRAST 08/10/2024 10:57:21 PM TECHNIQUE: CT of the head was performed without the administration of intravenous contrast. Automated exposure control, iterative reconstruction, and/or weight based adjustment of the mA/kV was utilized to reduce the radiation  dose to as low as reasonably achievable. COMPARISON: CT Head March 25, 25 CLINICAL HISTORY: Hit head, AMS FINDINGS: BRAIN AND VENTRICLES: Multiple large acute hemorrhages in the left occipital and left parietal lobes. The dominant left occipital hemorrhage measures up to 6.5 x 3.3 x 4.4 cm (estimated volume of 47 mL) and the dominant left parietal hemorrhage measures up to 5.3 x 1.8 x 2.4 cm (est volume of 11.5 mL). Surrounding edema. Regional mass effect without significant midline shift. There is small volume of extra-axial extension of hemorrhage. No evidence of acute large vascular territory infarct although the acute hemorrhage limits assessment. Moderate patchy white matter hypodensities, compatible with chronic microvascular eschemic change. Possible intraventricular extension of hemorrhage into the left occipital horn, which is effaced. Recommend attention on follow up. No hydrocephalus. No extra-axial collection. No mass effect or midline shift. ORBITS: No acute abnormality. SINUSES: No acute abnormality. SOFT TISSUES AND SKULL: No acute soft tissue abnormality. No skull fracture. Findings discussed  with Dr. Gennaro via telephone at 11:26 PM. IMPRESSION: 1. Multiple large acute intraparenchymal hemorrhages centered in the left parietal and occipital lobes, as detailed above. 2. Small volume of extra-axial extension of hemorrhage. 3. Possible intraventricular extension of hemorrhage into the left occipital horn, which is effaced. Recommend attention on follow up. 4. No significant midline shift. Electronically signed by: Glendia Molt MD 08/10/2024 11:27 PM EST RP Workstation: [id number]   CT ABDOMEN PELVIS WO CONTRAST Result Date: 08/10/2024 CLINICAL DATA:  Abdomen pain nausea vomiting diarrhea EXAM: CT ABDOMEN AND PELVIS WITHOUT CONTRAST TECHNIQUE: Multidetector CT imaging of the abdomen and pelvis was performed following the standard protocol without IV contrast. RADIATION DOSE REDUCTION: This exam was  performed according to the departmental dose-optimization program which includes automated exposure control, adjustment of the mA and/or kV according to patient size and/or use of iterative reconstruction technique. COMPARISON:  CT 01/14/2024, thoracic radiographs 06/20/2023 FINDINGS: Lower chest: Lung bases demonstrate no acute airspace disease. Circumferential distal esophageal thickening. Hepatobiliary: No focal liver abnormality is seen. Status post cholecystectomy. No biliary dilatation. Pancreas: Unremarkable. No pancreatic ductal dilatation or surrounding inflammatory changes. Spleen: Normal in size without focal abnormality. Adrenals/Urinary Tract: Adrenal glands are normal. The kidneys show no hydronephrosis. Slightly thick-walled urinary bladder Stomach/Bowel: Stomach nonenlarged. No dilated small bowel. Colon appears diffusely collapse but suspect that there may be diffuse colon wall thickening. Also suspect areas of small-bowel wall thickening, for example coronal series 8, image 27 Vascular/Lymphatic: Aortic atherosclerosis. No enlarged abdominal or pelvic lymph nodes. Reproductive: Calcified uterine fibroids.  No adnexal mass Other: No ascites or free air Musculoskeletal: No acute osseous abnormality. Chronic compression deformities at T10 and L2. IMPRESSION: 1. Slightly limited in the absence of contrast. Suspect diffuse colon wall thickening and areas of small-bowel wall thickening, as may be seen with enterocolitis of infectious or inflammatory etiology. 2. Circumferential distal esophageal thickening, possible esophagitis. 3. Slightly thick-walled urinary bladder, correlate with urinalysis to exclude cystitis. 4. Aortic atherosclerosis. Aortic Atherosclerosis (ICD10-I70.0). Electronically Signed   By: Luke Bun M.D.   On: 08/10/2024 23:12   DG Chest 1 View Result Date: 08/10/2024 EXAM: 1 VIEW XRAY OF THE CHEST 08/10/2024 09:56:00 PM COMPARISON: 03/11/2024 CLINICAL HISTORY: SOB FINDINGS:  LUNGS AND PLEURA: No focal pulmonary opacity. No pleural effusion. No pneumothorax. HEART AND MEDIASTINUM: No acute abnormality of the cardiac and mediastinal silhouettes. Aortic arch calcifications. BONES AND SOFT TISSUES: No acute osseous abnormality. Surgical clips in right upper quadrant. IMPRESSION: 1. No acute cardiopulmonary process. 2. Aortic Atherosclerosis (ICD10-I70.0). Electronically signed by: Pinkie Pebbles MD 08/10/2024 09:59 PM EST RP Workstation: [id number]      Subjective: Patient seen and examined at bedside today.  Hemodynamically stable.  Lying in bed.  Rectal tube was removed last night.  Diarrhea improving.  She appears comfortable.  Remains confused.  Not in any Distress  Discharge Exam: Vitals:   08/26/24 0806 08/26/24 1237  BP: (!) 132/51 (!) 147/57  Pulse: 97 96  Resp: 18 18  Temp: 97.9 F (36.6 C) 98.1 F (36.7 C)  SpO2: 96% 98%   Vitals:   08/26/24 0001 08/26/24 0500 08/26/24 0806 08/26/24 1237  BP: (!) 131/58 120/67 (!) 132/51 (!) 147/57  Pulse: (!) 102 94 97 96  Resp: 15 15 18 18   Temp: 98.4 F (36.9 C) 98.4 F (36.9 C) 97.9 F (36.6 C) 98.1 F (36.7 C)  TempSrc: Oral Axillary Oral Oral  SpO2: 96% 99% 96% 98%  Weight:      Height:  General: Pt is alert, awake, not in acute distress, confused Cardiovascular: RRR, S1/S2 +, no rubs, no gallops Respiratory: CTA bilaterally, no wheezing, no rhonchi Abdominal: Soft, NT, ND, bowel sounds + Extremities: no edema, no cyanosis    The results of significant diagnostics from this hospitalization (including imaging, microbiology, ancillary and laboratory) are listed below for reference.     Microbiology: No results found for this or any previous visit (from the past 240 hours).   Labs: BNP (last 3 results) Recent Labs    01/14/24 2327  BNP 774.0*   Basic Metabolic Panel: Recent Labs  Lab 08/21/24 0647 08/23/24 0127 08/25/24 0148  NA 134* 132* 133*  K 3.8 3.3* 4.1  CL 100 96*  100  CO2 24 26 26   GLUCOSE 132* 88 113*  BUN 19 19 20   CREATININE 0.63 0.65 0.67  CALCIUM 8.5* 8.8* 8.8*  MG 1.9 2.2 2.3  PHOS 2.8  --   --    Liver Function Tests: Recent Labs  Lab 08/21/24 0647 08/23/24 0127  AST 29 31  ALT 24 28  ALKPHOS 44 46  BILITOT <0.2 0.3  PROT 6.1* 6.3*  ALBUMIN 3.1* 3.3*   No results for input(s): LIPASE, AMYLASE in the last 168 hours. No results for input(s): AMMONIA in the last 168 hours. CBC: Recent Labs  Lab 08/21/24 0647 08/23/24 0127 08/25/24 0148 08/26/24 0214  WBC 13.1* 14.8* 13.1* 12.6*  HGB 10.1* 10.1* 9.8* 10.0*  HCT 29.5* 29.6* 29.2* 29.7*  MCV 93.1 94.3 95.7 95.5  PLT 334 376 377 411*   Cardiac Enzymes: No results for input(s): CKTOTAL, CKMB, CKMBINDEX, TROPONINI in the last 168 hours. BNP: Invalid input(s): POCBNP CBG: Recent Labs  Lab 08/25/24 1945 08/25/24 2343 08/26/24 0308 08/26/24 0805 08/26/24 1239  GLUCAP 145* 110* 117* 110* 113*   D-Dimer No results for input(s): DDIMER in the last 72 hours. Hgb A1c No results for input(s): HGBA1C in the last 72 hours. Lipid Profile No results for input(s): CHOL, HDL, LDLCALC, TRIG, CHOLHDL, LDLDIRECT in the last 72 hours. Thyroid  function studies No results for input(s): TSH, T4TOTAL, T3FREE, THYROIDAB in the last 72 hours.  Invalid input(s): FREET3 Anemia work up No results for input(s): VITAMINB12, FOLATE, FERRITIN, TIBC, IRON, RETICCTPCT in the last 72 hours. Urinalysis    Component Value Date/Time   COLORURINE YELLOW 08/14/2024 1632   APPEARANCEUR HAZY (A) 08/14/2024 1632   LABSPEC 1.016 08/14/2024 1632   PHURINE 7.0 08/14/2024 1632   GLUCOSEU 50 (A) 08/14/2024 1632   HGBUR NEGATIVE 08/14/2024 1632   BILIRUBINUR NEGATIVE 08/14/2024 1632   BILIRUBINUR negative 06/20/2023 1231   KETONESUR NEGATIVE 08/14/2024 1632   PROTEINUR 30 (A) 08/14/2024 1632   UROBILINOGEN 0.2 06/20/2023 1231   UROBILINOGEN 0.2  10/21/2010 2043   NITRITE NEGATIVE 08/14/2024 1632   LEUKOCYTESUR NEGATIVE 08/14/2024 1632   Sepsis Labs Recent Labs  Lab 08/21/24 0647 08/23/24 0127 08/25/24 0148 08/26/24 0214  WBC 13.1* 14.8* 13.1* 12.6*   Microbiology No results found for this or any previous visit (from the past 240 hours).  Please note: You were cared for by a hospitalist during your hospital stay. Once you are discharged, your primary care physician will handle any further medical issues. Please note that NO REFILLS for any discharge medications will be authorized once you are discharged, as it is imperative that you return to your primary care physician (or establish a relationship with a primary care physician if you do not have one) for your post hospital  discharge needs so that they can reassess your need for medications and monitor your lab values.    Time coordinating discharge: 40 minutes  SIGNED:   Ivonne Mustache, MD  [facility] 08/26/2024, 1:42 PM Pager [phone]  If 7PM-7AM, please contact night-coverage [email or web address] Password [email or web address]    [1]  Allergies Allergen Reactions   Other Diarrhea    All gel capsules cause severe diarrhea - does not matter what medication. Past reactions include AREDS, vitamin E capsules, vitamin D3 capsules, Advil  Liqui-gels. Spouse unsure if gelatin is the cause as patient has eaten Jello with no reaction.   Sulfa Antibiotics Itching and Rash

## 2024-08-26 NOTE — Progress Notes (Signed)
 " Daily Progress Note   Date: 08/26/2024   Patient Name: [name]  DOB: [birth year]  MRN: [id number]  Age / Sex: 84 y.o., female  Attending Physician: Jillian Buttery, MD Primary Care Physician: Patti Mliss LABOR, FNP Admit Date: 08/10/2024 Length of Stay: 16 days  Reason for Follow-up: Establishing goals of care  Past Medical History:  Diagnosis Date   Anemia    Anxiety    Arthritis    Chronic kidney disease    COPD (chronic obstructive pulmonary disease) (HCC)    Dementia (HCC)    vascular dementia   GERD (gastroesophageal reflux disease)    History of kidney stones    Macular degeneration of left eye    Wet and gets injections q monthly   Smoker    1- 1 1/2 ppd  per Husband   Stroke (HCC)    TIAs    Subjective:   Subjective: Chart Reviewed. Updates received. Patient Assessed. Created space and opportunity for patient  and family to explore thoughts and feelings regarding current medical situation.  Today's Discussion: Today before meeting with the patient/family, I reviewed the chart notes including TOC note from yesterday, nurse note from today, hospitalist note from today, TOC note from today. I also reviewed vital signs, nursing flowsheets, medication administrations record, labs, and imaging. Labs reviewed include CBC with continued improvement in white blood cell count at 12.6 today.  Today saw the patient at bedside, she was laying in the bed and awake/alert, has mitts on.  Her husband was at the bedside.  She denies pain, nausea, vomiting.  She is interactive and having conversation with appropriate responses to my questions and comments.  We spent time talking about her progress over the past several days with her husband and he is happy that she has gotten better.  Her appetite is quite good today.  Per flowsheet she ate 80% of her breakfast and 75% of her lunch today.  We again spent time talking about nutrition and how it is required to support appropriate  rehabilitation.  Finally we talked about the plan for discharge to SNF/rehab.  Again, physical therapy feels that she has rehab potential.  Insurance has approved a rehab stay and bed offers were sent out.  Just earlier today a bed was offered and accepted at [facility] and anticipate discharge to SNF/rehab later today.  I spent time answering questions and offering ongoing support to the patient's husband.  I wished him the best in the future and hope that the patient continues to improve to be able to get home and back to a quality life. I provided emotional and general support through therapeutic listening, empathy, sharing of stories, and other techniques. I answered all questions and addressed all concerns to the best of my ability.  Review of Systems  Constitutional:        Denies pain in general  Gastrointestinal:  Negative for abdominal pain, nausea and vomiting.    Objective:   Primary Diagnoses: Present on Admission: **None**   Vital Signs:  BP (!) 147/57 (BP Location: Right Arm)   Pulse 96   Temp 98.1 F (36.7 C) (Oral)   Resp 18   Ht 4' 10 (1.473 m)   Wt 48.9 kg   SpO2 98%   BMI 22.53 kg/m   Physical Exam Vitals and nursing note reviewed.  Constitutional:      General: She is not in acute distress.    Appearance: She is ill-appearing. She is not  toxic-appearing.  HENT:     Head: Normocephalic and atraumatic.  Cardiovascular:     Rate and Rhythm: Normal rate.  Pulmonary:     Effort: Pulmonary effort is normal. No respiratory distress.  Abdominal:     General: Abdomen is flat. There is no distension.  Skin:    General: Skin is warm and dry.  Neurological:     Mental Status: She is alert.  Psychiatric:        Mood and Affect: Mood normal.        Behavior: Behavior normal.     Palliative Assessment/Data: 50-60%   Existing Vynca/ACP Documentation: None  Assessment & Plan:   HPI/Patient Profile:  84 y.o. female with past medical history of  vascular dementia, anemia of chronic illness, GERD, bilateral ureteral stents, and prior TIA, presents for evaluation of nausea, vomiting, diarrhea for 3 days and fall. After ED evaluation she was admitted on 08/10/2024 with multiple large acute intraparenchymal hemorrhages centered of the left parietal and occipital lobes without midline shift, AVM, fall of unclear mechanism, diarrhea, dehydration, nausea/vomiting, sepsis secondary to GI versus UTI source, vascular dementia, and others.   08/26/2024: Today the patient continues to do well, appetite remains quite good.  PT feels that she is rehabable and insurance has approved discharge to SNF/rehab.  Husband is at the bedside and is very happy with how well she has done.  Confirmed that she would not want a feeding tube, should her appetite fall off in the future.  Anticipate discharge to SNF/rehab today or tomorrow.  SUMMARY OF RECOMMENDATIONS   DNR-Limited Ongoing encouragement of oral intake Anticipate discharge to SNF/rehab today or tomorrow Palliative medicine will back off at this time as goals are clear and discharge approaching Please notify us  of any significant clinical change or new palliative needs  Symptom Management:  Per primary team Palliative medicine is available to assist as needed  Code Status: DNR - Limited (DNR/DNI)  Prognosis: Unable to determine  Discharge Planning: SNF/Rehab  Discussed with: Patient, family, medical team, nursing team  Thank you for allowing us  to participate in the care of [name] PMT will continue to support holistically.  Time Total: 35 min  Detailed review of medical records (labs, imaging, vital signs), medically appropriate exam, discussed with treatment team, counseling and education to patient, family, & staff, documenting clinical information, medication management, coordination of care  Meghan Kays, NP Palliative Medicine Team  Team Phone # [phone] (Nights/Weekends)   03/20/2021, 8:17 AM  "

## 2024-08-26 NOTE — Progress Notes (Signed)
 " PROGRESS NOTE  [name]  FMW:[id number] DOB: 02-Sep-1940 DOA: 08/10/2024 PCP: Patti Mliss LABOR, FNP   Brief Narrative: Patient is a 84 year old female with history of vascular dementia, anemia of chronic illness, GERD, bilateral ureteral stents, TIA who presented with nausea, vomiting, diarrhea, fall.  On presentation she was obtunded, admitted under critical care team.  Found to have multiple large acute intraparenchymal hemorrhage on the left. Occipital lobes.  Neurology, neurosurgery consulted.  After clinical stability, patient was transferred to [facility] service on 1/25.  Hospital course remarkable for persistent diarrhea, fever and was started on antibiotics.  PT/OT recommending SNF on discharge.  TOC following.  Assessment & Plan:  Principal Problem:   Intracranial hemorrhage (HCC) Active Problems:   Altered mental status   Fall   Diarrhea   Malnutrition of moderate degree   Traumatic left-sided intracerebral hemorrhage with unknown loss of consciousness status (HCC)  Multiple large acute intraparenchymal hemorrhage: Presented as obtunded.  Found to have large acute intraparenchymal hemorrhage centered on the left parietal, supra lobe.  Also found to have subarachnoid hemorrhage, subdural hematoma extension.  Suspected to be from AVM versus traumatic versus hemorrhagic conversion. -CT head 1/20: Multiple large acute IPH left parietal lobe and left occipital lobe, small volume extra axial  extension of hemorrhage.  Possible Intraventricular  extension into left occipital horn. - Repeated CT head 1/21st: Stable extensive left posterior hemisphere intra-axial hemorrhage, stable small regional subarachnoid/subdural space extension of blood.  - MRI brain: Left occipital posterior left temporal lobe hemorrhage, left parietal lobe hemorrhage with layering blood product hyperemia. - CTA consistent with AVM Neurology, neurosurgery were following.  Neurosurgery considering cerebral angiogram in  the future if patient has neurological recovery which is less likely. Currently on Keppra . EEG on 1/20 showed cortical dysfunction, generalized cerebral dysfunction, no seizure activity. Stroke workup completed.  LDL of 70, A1c of 5.4, EF of 65% with moderate dilation of left and right atrial size.  She was previously on aspirin  which has been discontinued.  Nausea/vomiting/diarrhea: Abdominal x-ray unremarkable. Given Reglan .  Abdomen remains benign.  Hypertension: Currently on amlodipine   Sepsis secondary to GI source/UTI/concern for colitis/persistent diarrhea: On presentation, she was febrile, tachycardic, had leukocytosis.  Chest x-ray did not show any acute findings.  COVID/flu/RSV negative.  Respiratory viral panel negative.  CT abdomen/pelvis showed diffuse colon wall thickening, area of small bowel thickening suspicious for enterocolitis of infectious or inflammatory etiology.  Also showed features of esophagitis.  Urine culture, blood culture no growth till date.  Patient completed 10 days course of ceftriaxone  and Flagyl  . Patient has been afebrile now.  Has mild leukocytosis.  On Imodium  for diarrhea.  On Protonix  for suspected esophagitis.  Still has rectal tube with loose stool.  Hypokalemia/hypophosphatemia: Currently being monitored and supplemented  Dysphagia: Secondary to stroke.  Was on tube feeding.  Now tube taken out.  Currently on dysphagia 1 diet.  Speech therapy following.  History of vascular dementia/anxiety/depression: Currently on Namenda , Aricept   Anemia of chronic disease: Continue monitor hemoglobin intermittently.  Currently stable.  Goals of care: Poor prognosis and quality of life due to catastrophic neurologic stroke.  Neurological recovery is questionable at this point.  Palliative care following for goals of care.  CODE STATUS DNR.  Husband discussing with palliative care and will determine next steps depending upon her clinical improvement.  Currently he  wants to continue current management plan  Disposition: PT/OT recommending SNF on discharge.  TOC closely following    Nutrition Problem:  Moderate Malnutrition Etiology: social / environmental circumstances    DVT prophylaxis:heparin  injection 5,000 Units Start: 08/13/24 1400 SCDs Start: 08/10/24 2350     Code Status: Limited: Do not attempt resuscitation (DNR) -DNR-LIMITED -Do Not Intubate/DNI   Family Communication:: Discussed with husband Lynwood on phone on 2/5  Patient status:Inpatient  Patient is from : Home  Anticipated discharge to:SnF  Estimated DC date:not sure   Consultants: Neurology  Procedures:None  Antimicrobials:  Anti-infectives (From admission, onward)    Start     Dose/Rate Route Frequency Ordered Stop   08/15/24 1400  metroNIDAZOLE  (FLAGYL ) IVPB 500 mg        500 mg 100 mL/hr over 60 Minutes Intravenous Every 12 hours 08/15/24 1303 08/25/24 0700   08/15/24 1330  cefTRIAXone  (ROCEPHIN ) 2 g in sodium chloride  0.9 % 100 mL IVPB        2 g 200 mL/hr over 30 Minutes Intravenous Every 24 hours 08/15/24 1242 08/24/24 1300   08/12/24 2200  vancomycin  (VANCOREADY) IVPB 750 mg/150 mL  Status:  Discontinued        750 mg 150 mL/hr over 60 Minutes Intravenous Every 48 hours 08/10/24 2357 08/11/24 0405   08/11/24 2200  cefTRIAXone  (ROCEPHIN ) 1 g in sodium chloride  0.9 % 100 mL IVPB  Status:  Discontinued        1 g 200 mL/hr over 30 Minutes Intravenous Every 24 hours 08/10/24 2357 08/11/24 1109   08/11/24 2200  cefTRIAXone  (ROCEPHIN ) 2 g in sodium chloride  0.9 % 100 mL IVPB  Status:  Discontinued        2 g 200 mL/hr over 30 Minutes Intravenous Every 24 hours 08/11/24 1109 08/13/24 1002   08/11/24 1000  metroNIDAZOLE  (FLAGYL ) IVPB 500 mg  Status:  Discontinued        500 mg 100 mL/hr over 60 Minutes Intravenous Every 12 hours 08/11/24 0405 08/11/24 1109   08/10/24 2115  ceFEPIme  (MAXIPIME ) 2 g in sodium chloride  0.9 % 100 mL IVPB        2 g 200 mL/hr over  30 Minutes Intravenous  Once 08/10/24 2110 08/10/24 2238   08/10/24 2115  metroNIDAZOLE  (FLAGYL ) IVPB 500 mg        500 mg 100 mL/hr over 60 Minutes Intravenous  Once 08/10/24 2110 08/11/24 0020   08/10/24 2115  vancomycin  (VANCOCIN ) IVPB 1000 mg/200 mL premix  Status:  Discontinued        1,000 mg 200 mL/hr over 60 Minutes Intravenous  Once 08/10/24 2110 08/11/24 0008       Subjective: Patient seen and examined at bedside today.  Hemodynamically stable.  Lying on bed.  Appears confused but overall comfortable.  Not in any kind of distress.  Denying new complaints.  Moves all extremities but has global weakness.  Not agitated  Objective: Vitals:   08/25/24 2100 08/26/24 0001 08/26/24 0500 08/26/24 0806  BP: (!) 123/59 (!) 131/58 120/67 (!) 132/51  Pulse: (!) 109 (!) 102 94 97  Resp: 16 15 15 18   Temp: 99.2 F (37.3 C) 98.4 F (36.9 C) 98.4 F (36.9 C) 97.9 F (36.6 C)  TempSrc: Oral Oral Axillary Oral  SpO2: 99% 96% 99% 96%  Weight:      Height:        Intake/Output Summary (Last 24 hours) at 08/26/2024 1037 Last data filed at 08/26/2024 0500 Gross per 24 hour  Intake 297 ml  Output 500 ml  Net -203 ml   Filed Weights   08/22/24 0500 08/23/24  0500 08/24/24 0500  Weight: 51.3 kg 49.7 kg 48.9 kg    Examination:  General exam: Overall comfortable, not in distress,lying on bed, chronically deconditioned HEENT: PERRL Respiratory system:  no wheezes or crackles  Cardiovascular system: S1 & S2 heard, RRR.  Gastrointestinal system: Abdomen is nondistended, soft and nontender. Central nervous system: Alert and awake but not oriented, moves all extremities Extremities: No edema, no clubbing ,no cyanosis, mittens on bilateral hands Skin: No rashes, no ulcers,no icterus     Data Reviewed: I have personally reviewed following labs and imaging studies  CBC: Recent Labs  Lab 08/21/24 0647 08/23/24 0127 08/25/24 0148 08/26/24 0214  WBC 13.1* 14.8* 13.1* 12.6*  HGB 10.1*  10.1* 9.8* 10.0*  HCT 29.5* 29.6* 29.2* 29.7*  MCV 93.1 94.3 95.7 95.5  PLT 334 376 377 411*   Basic Metabolic Panel: Recent Labs  Lab 08/21/24 0647 08/23/24 0127 08/25/24 0148  NA 134* 132* 133*  K 3.8 3.3* 4.1  CL 100 96* 100  CO2 24 26 26   GLUCOSE 132* 88 113*  BUN 19 19 20   CREATININE 0.63 0.65 0.67  CALCIUM 8.5* 8.8* 8.8*  MG 1.9 2.2 2.3  PHOS 2.8  --   --      No results found for this or any previous visit (from the past 240 hours).    Radiology Studies: No results found.  Scheduled Meds:  amLODipine   5 mg Oral Daily   donepezil   10 mg Oral QHS   feeding supplement  237 mL Oral BID BM   heparin  injection (subcutaneous)  5,000 Units Subcutaneous Q8H   insulin  aspart  0-9 Units Subcutaneous TID WC   memantine   5 mg Oral BID   metoCLOPramide  (REGLAN ) injection  10 mg Intravenous Once   metoCLOPramide   5 mg Oral TID   multivitamin with minerals  1 tablet Oral Daily   mouth rinse  15 mL Mouth Rinse 4 times per day   pantoprazole  (PROTONIX ) IV  40 mg Intravenous Q24H   Continuous Infusions:     LOS: 16 days   Ivonne Mustache, MD [facility] P2/11/2024, 10:37 AM  "

## 2024-08-26 NOTE — TOC Progression Note (Signed)
 Transition of Care [facility]) - Progression Note    Patient Details  Name: [name] MRN: [id number] Date of Birth: 1940-08-16  Transition of Care [facility]) CM/SW Contact  Almarie CHRISTELLA Goodie, [place] Phone Number: 08/26/2024, 10:47 AM  Clinical Narrative:   CSW spoke with spouse to discuss bed offers. Spouse chose [facility]. [facility] has bed available. CSW contacted CMA to request insurance authorization. Patient's PASRR still pending, CSW responded to question in [place]. CSW to follow.    Expected Discharge Plan: Skilled Nursing Facility Barriers to Discharge: Continued Medical Work up, English As A Second Language Teacher, Engineer, Mining)               Expected Discharge Plan and Services In-house Referral: Clinical Social Work     Living arrangements for the past 2 months: Single Family Home                                       Social Drivers of Health (SDOH) Interventions SDOH Screenings   Food Insecurity: Patient Unable To Answer (03/12/2024)  Housing: Patient Unable To Answer (03/12/2024)  Transportation Needs: Patient Unable To Answer (03/12/2024)  Utilities: Patient Unable To Answer (03/12/2024)  Social Connections: Patient Unable To Answer (03/12/2024)  Recent Concern: Social Connections - Moderately Isolated (01/14/2024)  Tobacco Use: High Risk (06/09/2024)   Received from [facility]    Readmission Risk Interventions    10/20/2023   10:53 AM 10/16/2023    1:47 PM  Readmission Risk Prevention Plan  Post Dischage Appt Complete   Medication Screening Complete   Transportation Screening Complete Complete  PCP or Specialist Appt within 5-7 Days  Complete  Home Care Screening  Complete  Medication Review (RN CM)  Complete

## 2024-08-26 NOTE — Plan of Care (Signed)

## 2024-08-26 NOTE — TOC Transition Note (Signed)
 Transition of Care [facility]) - Discharge Note   Patient Details  Name: [name] MRN: [id number] Date of Birth: 04-03-1941  Transition of Care [facility]) CM/SW Contact:  Almarie CHRISTELLA Goodie, LCSW Phone Number: 08/26/2024, 2:48 PM   Clinical Narrative:   Patient received insurance approval for SNF, and PASRR was received. [facility] can admit today. CSW met with spouse at bedside, he is in agreement. MD updated, discharge completed, and sent to [facility]. Transport arranged with [facility] for next available.  Nurse to call report to [phone], Room 508.    Final next level of care: Skilled Nursing Facility Barriers to Discharge: Barriers Resolved   Patient Goals and CMS Choice            Discharge Placement              Patient chooses bed at: [facility] Patient to be transferred to facility by: [facility] Name of family member notified: [name] Patient and family notified of of transfer: 08/26/24  Discharge Plan and Services Additional resources added to the After Visit Summary for   In-house Referral: Clinical Social Work                                   Social Drivers of Health (SDOH) Interventions SDOH Screenings   Food Insecurity: Patient Unable To Answer (03/12/2024)  Housing: Patient Unable To Answer (03/12/2024)  Transportation Needs: Patient Unable To Answer (03/12/2024)  Utilities: Patient Unable To Answer (03/12/2024)  Social Connections: Patient Unable To Answer (03/12/2024)  Recent Concern: Social Connections - Moderately Isolated (01/14/2024)  Tobacco Use: High Risk (06/09/2024)   Received from [facility]     Readmission Risk Interventions    10/20/2023   10:53 AM 10/16/2023    1:47 PM  Readmission Risk Prevention Plan  Post Dischage Appt Complete   Medication Screening Complete   Transportation Screening Complete Complete  PCP or Specialist Appt within 5-7 Days  Complete  Home Care Screening  Complete  Medication Review (RN CM)  Complete

## 2024-08-26 NOTE — Plan of Care (Signed)
